# Patient Record
Sex: Female | Born: 1935
Health system: Southern US, Community
[De-identification: ages and names within clinical notes are randomized; demographics above are authoritative.]

## PROBLEM LIST (undated history)

## (undated) DIAGNOSIS — N19 Unspecified kidney failure: Secondary | ICD-10-CM

## (undated) DIAGNOSIS — K219 Gastro-esophageal reflux disease without esophagitis: Secondary | ICD-10-CM

## (undated) DIAGNOSIS — M1990 Unspecified osteoarthritis, unspecified site: Secondary | ICD-10-CM

## (undated) DIAGNOSIS — M25579 Pain in unspecified ankle and joints of unspecified foot: Secondary | ICD-10-CM

## (undated) DIAGNOSIS — H43819 Vitreous degeneration, unspecified eye: Secondary | ICD-10-CM

## (undated) DIAGNOSIS — F32A Depression, unspecified: Secondary | ICD-10-CM

## (undated) DIAGNOSIS — D500 Iron deficiency anemia secondary to blood loss (chronic): Secondary | ICD-10-CM

## (undated) DIAGNOSIS — G8929 Other chronic pain: Secondary | ICD-10-CM

## (undated) DIAGNOSIS — M79646 Pain in unspecified finger(s): Secondary | ICD-10-CM

## (undated) DIAGNOSIS — N184 Chronic kidney disease, stage 4 (severe): Secondary | ICD-10-CM

## (undated) DIAGNOSIS — M79659 Pain in unspecified thigh: Secondary | ICD-10-CM

## (undated) DIAGNOSIS — B9681 Helicobacter pylori [H. pylori] as the cause of diseases classified elsewhere: Secondary | ICD-10-CM

## (undated) DIAGNOSIS — K648 Other hemorrhoids: Secondary | ICD-10-CM

## (undated) DIAGNOSIS — H11159 Pinguecula, unspecified eye: Secondary | ICD-10-CM

## (undated) DIAGNOSIS — F329 Major depressive disorder, single episode, unspecified: Secondary | ICD-10-CM

## (undated) DIAGNOSIS — M109 Gout, unspecified: Secondary | ICD-10-CM

## (undated) DIAGNOSIS — D509 Iron deficiency anemia, unspecified: Secondary | ICD-10-CM

## (undated) DIAGNOSIS — IMO0002 Reserved for concepts with insufficient information to code with codable children: Secondary | ICD-10-CM

## (undated) DIAGNOSIS — M25569 Pain in unspecified knee: Secondary | ICD-10-CM

## (undated) DIAGNOSIS — K2970 Gastritis, unspecified, without bleeding: Secondary | ICD-10-CM

## (undated) DIAGNOSIS — K5520 Angiodysplasia of colon without hemorrhage: Secondary | ICD-10-CM

## (undated) DIAGNOSIS — I639 Cerebral infarction, unspecified: Secondary | ICD-10-CM

## (undated) DIAGNOSIS — Z9289 Personal history of other medical treatment: Secondary | ICD-10-CM

## (undated) DIAGNOSIS — M810 Age-related osteoporosis without current pathological fracture: Secondary | ICD-10-CM

## (undated) DIAGNOSIS — H269 Unspecified cataract: Secondary | ICD-10-CM

## (undated) DIAGNOSIS — I313 Pericardial effusion (noninflammatory): Secondary | ICD-10-CM

## (undated) DIAGNOSIS — M25649 Stiffness of unspecified hand, not elsewhere classified: Secondary | ICD-10-CM

## (undated) DIAGNOSIS — I10 Essential (primary) hypertension: Secondary | ICD-10-CM

## (undated) HISTORY — DX: Age-related osteoporosis without current pathological fracture: M81.0

## (undated) HISTORY — DX: Other hemorrhoids: K64.8

## (undated) HISTORY — DX: Pain in unspecified thigh: M79.659

## (undated) HISTORY — DX: Essential (primary) hypertension: I10

## (undated) HISTORY — DX: Helicobacter pylori (H. pylori) as the cause of diseases classified elsewhere: K29.70

## (undated) HISTORY — DX: Reserved for concepts with insufficient information to code with codable children: IMO0002

## (undated) HISTORY — DX: Angiodysplasia of colon without hemorrhage: K55.20

## (undated) HISTORY — DX: Vitreous degeneration, unspecified eye: H43.819

## (undated) HISTORY — DX: Iron deficiency anemia secondary to blood loss (chronic): D50.0

## (undated) HISTORY — DX: Unspecified cataract: H26.9

## (undated) HISTORY — DX: Helicobacter pylori (H. pylori) as the cause of diseases classified elsewhere: B96.81

## (undated) HISTORY — DX: Iron deficiency anemia, unspecified: D50.9

## (undated) HISTORY — DX: Depression, unspecified: F32.A

## (undated) HISTORY — DX: Stiffness of unspecified hand, not elsewhere classified: M25.649

## (undated) HISTORY — DX: Major depressive disorder, single episode, unspecified: F32.9

## (undated) HISTORY — DX: Unspecified kidney failure: N19

## (undated) HISTORY — DX: Pericardial effusion (noninflammatory): I31.3

## (undated) HISTORY — DX: Pinguecula, unspecified eye: H11.159

---

## 1958-09-02 HISTORY — PX: ABDOMINAL HYSTERECTOMY: SHX81

## 1958-09-02 HISTORY — PX: TUBAL LIGATION: SHX77

## 1998-02-11 ENCOUNTER — Ambulatory Visit (HOSPITAL_COMMUNITY): Admission: RE | Admit: 1998-02-11 | Discharge: 1998-02-11 | Payer: Self-pay | Admitting: Family Medicine

## 1998-02-11 ENCOUNTER — Encounter: Payer: Self-pay | Admitting: Family Medicine

## 1999-01-13 ENCOUNTER — Encounter: Payer: Self-pay | Admitting: Emergency Medicine

## 1999-01-13 ENCOUNTER — Observation Stay (HOSPITAL_COMMUNITY): Admission: EM | Admit: 1999-01-13 | Discharge: 1999-01-14 | Payer: Self-pay | Admitting: Emergency Medicine

## 1999-01-15 ENCOUNTER — Encounter: Payer: Self-pay | Admitting: Family Medicine

## 1999-01-25 ENCOUNTER — Encounter: Admission: RE | Admit: 1999-01-25 | Discharge: 1999-01-25 | Payer: Self-pay | Admitting: Family Medicine

## 1999-02-21 ENCOUNTER — Encounter: Admission: RE | Admit: 1999-02-21 | Discharge: 1999-02-21 | Payer: Self-pay | Admitting: Internal Medicine

## 2001-02-13 ENCOUNTER — Emergency Department (HOSPITAL_COMMUNITY): Admission: EM | Admit: 2001-02-13 | Discharge: 2001-02-13 | Payer: Self-pay | Admitting: Emergency Medicine

## 2001-03-01 ENCOUNTER — Encounter: Payer: Self-pay | Admitting: Emergency Medicine

## 2001-03-01 ENCOUNTER — Emergency Department (HOSPITAL_COMMUNITY): Admission: EM | Admit: 2001-03-01 | Discharge: 2001-03-01 | Payer: Self-pay | Admitting: Emergency Medicine

## 2001-03-24 ENCOUNTER — Encounter: Admission: RE | Admit: 2001-03-24 | Discharge: 2001-03-24 | Payer: Self-pay | Admitting: Internal Medicine

## 2001-05-12 ENCOUNTER — Encounter: Admission: RE | Admit: 2001-05-12 | Discharge: 2001-05-12 | Payer: Self-pay | Admitting: Internal Medicine

## 2002-03-30 ENCOUNTER — Encounter: Admission: RE | Admit: 2002-03-30 | Discharge: 2002-03-30 | Payer: Self-pay | Admitting: Internal Medicine

## 2002-05-19 ENCOUNTER — Emergency Department (HOSPITAL_COMMUNITY): Admission: EM | Admit: 2002-05-19 | Discharge: 2002-05-19 | Payer: Self-pay | Admitting: Emergency Medicine

## 2002-05-19 ENCOUNTER — Encounter: Payer: Self-pay | Admitting: Emergency Medicine

## 2002-08-17 ENCOUNTER — Encounter: Admission: RE | Admit: 2002-08-17 | Discharge: 2002-08-17 | Payer: Self-pay | Admitting: Internal Medicine

## 2003-01-07 ENCOUNTER — Encounter: Admission: RE | Admit: 2003-01-07 | Discharge: 2003-01-07 | Payer: Self-pay | Admitting: Internal Medicine

## 2003-02-15 ENCOUNTER — Encounter: Admission: RE | Admit: 2003-02-15 | Discharge: 2003-02-15 | Payer: Self-pay | Admitting: Internal Medicine

## 2003-02-24 ENCOUNTER — Encounter: Admission: RE | Admit: 2003-02-24 | Discharge: 2003-02-24 | Payer: Self-pay | Admitting: Internal Medicine

## 2003-02-24 ENCOUNTER — Ambulatory Visit (HOSPITAL_COMMUNITY): Admission: RE | Admit: 2003-02-24 | Discharge: 2003-02-24 | Payer: Self-pay | Admitting: Internal Medicine

## 2003-04-19 ENCOUNTER — Encounter: Admission: RE | Admit: 2003-04-19 | Discharge: 2003-04-19 | Payer: Self-pay | Admitting: Internal Medicine

## 2003-04-20 ENCOUNTER — Emergency Department (HOSPITAL_COMMUNITY): Admission: EM | Admit: 2003-04-20 | Discharge: 2003-04-20 | Payer: Self-pay | Admitting: Emergency Medicine

## 2003-05-07 ENCOUNTER — Encounter: Admission: RE | Admit: 2003-05-07 | Discharge: 2003-05-07 | Payer: Self-pay | Admitting: Internal Medicine

## 2003-05-12 ENCOUNTER — Encounter: Admission: RE | Admit: 2003-05-12 | Discharge: 2003-05-12 | Payer: Self-pay | Admitting: Internal Medicine

## 2003-05-13 ENCOUNTER — Encounter: Payer: Self-pay | Admitting: Internal Medicine

## 2003-12-06 ENCOUNTER — Ambulatory Visit: Payer: Self-pay | Admitting: Internal Medicine

## 2003-12-20 ENCOUNTER — Ambulatory Visit: Payer: Self-pay | Admitting: Internal Medicine

## 2004-03-22 ENCOUNTER — Ambulatory Visit: Payer: Self-pay | Admitting: Internal Medicine

## 2004-03-28 ENCOUNTER — Emergency Department (HOSPITAL_COMMUNITY): Admission: EM | Admit: 2004-03-28 | Discharge: 2004-03-28 | Payer: Self-pay | Admitting: Emergency Medicine

## 2004-05-10 ENCOUNTER — Ambulatory Visit (HOSPITAL_COMMUNITY): Admission: RE | Admit: 2004-05-10 | Discharge: 2004-05-10 | Payer: Self-pay | Admitting: Internal Medicine

## 2004-05-10 ENCOUNTER — Encounter: Payer: Self-pay | Admitting: Cardiology

## 2004-05-18 ENCOUNTER — Ambulatory Visit: Payer: Self-pay | Admitting: Cardiology

## 2004-09-06 ENCOUNTER — Ambulatory Visit: Payer: Self-pay | Admitting: Internal Medicine

## 2004-09-13 ENCOUNTER — Ambulatory Visit: Payer: Self-pay | Admitting: Internal Medicine

## 2004-10-30 ENCOUNTER — Inpatient Hospital Stay (HOSPITAL_COMMUNITY): Admission: EM | Admit: 2004-10-30 | Discharge: 2004-11-04 | Payer: Self-pay | Admitting: Emergency Medicine

## 2004-10-31 ENCOUNTER — Encounter: Payer: Self-pay | Admitting: Cardiology

## 2004-11-01 ENCOUNTER — Encounter (INDEPENDENT_AMBULATORY_CARE_PROVIDER_SITE_OTHER): Payer: Self-pay | Admitting: Specialist

## 2004-11-01 DIAGNOSIS — I3139 Other pericardial effusion (noninflammatory): Secondary | ICD-10-CM

## 2004-11-01 DIAGNOSIS — I313 Pericardial effusion (noninflammatory): Secondary | ICD-10-CM

## 2004-11-01 HISTORY — DX: Other pericardial effusion (noninflammatory): I31.39

## 2004-11-01 HISTORY — DX: Pericardial effusion (noninflammatory): I31.3

## 2004-11-01 HISTORY — PX: SUBXYPHOID PERICARDIAL WINDOW: SHX5075

## 2004-11-27 ENCOUNTER — Encounter
Admission: RE | Admit: 2004-11-27 | Discharge: 2004-11-27 | Payer: Self-pay | Admitting: Thoracic Surgery (Cardiothoracic Vascular Surgery)

## 2004-12-13 ENCOUNTER — Ambulatory Visit: Payer: Self-pay | Admitting: Internal Medicine

## 2004-12-13 ENCOUNTER — Inpatient Hospital Stay (HOSPITAL_COMMUNITY): Admission: EM | Admit: 2004-12-13 | Discharge: 2004-12-15 | Payer: Self-pay | Admitting: Emergency Medicine

## 2004-12-13 ENCOUNTER — Encounter: Payer: Self-pay | Admitting: Cardiology

## 2005-01-01 DIAGNOSIS — I639 Cerebral infarction, unspecified: Secondary | ICD-10-CM

## 2005-01-01 HISTORY — PX: CARDIAC CATHETERIZATION: SHX172

## 2005-01-01 HISTORY — DX: Cerebral infarction, unspecified: I63.9

## 2005-01-10 ENCOUNTER — Inpatient Hospital Stay (HOSPITAL_BASED_OUTPATIENT_CLINIC_OR_DEPARTMENT_OTHER): Admission: RE | Admit: 2005-01-10 | Discharge: 2005-01-10 | Payer: Self-pay | Admitting: Cardiology

## 2005-02-21 ENCOUNTER — Ambulatory Visit: Payer: Self-pay | Admitting: Internal Medicine

## 2005-03-06 ENCOUNTER — Ambulatory Visit (HOSPITAL_COMMUNITY): Admission: RE | Admit: 2005-03-06 | Discharge: 2005-03-06 | Payer: Self-pay | Admitting: Internal Medicine

## 2005-03-06 ENCOUNTER — Encounter (INDEPENDENT_AMBULATORY_CARE_PROVIDER_SITE_OTHER): Payer: Self-pay | Admitting: Cardiology

## 2005-03-08 ENCOUNTER — Encounter: Admission: RE | Admit: 2005-03-08 | Discharge: 2005-03-08 | Payer: Self-pay | Admitting: Internal Medicine

## 2005-03-08 ENCOUNTER — Encounter: Payer: Self-pay | Admitting: Internal Medicine

## 2005-03-08 LAB — HM MAMMOGRAPHY: HM Mammogram: NORMAL

## 2005-04-18 ENCOUNTER — Ambulatory Visit: Payer: Self-pay | Admitting: Internal Medicine

## 2005-04-19 ENCOUNTER — Ambulatory Visit: Payer: Self-pay | Admitting: Internal Medicine

## 2005-04-19 ENCOUNTER — Inpatient Hospital Stay (HOSPITAL_COMMUNITY): Admission: AD | Admit: 2005-04-19 | Discharge: 2005-04-21 | Payer: Self-pay | Admitting: Internal Medicine

## 2005-04-19 ENCOUNTER — Encounter: Admission: RE | Admit: 2005-04-19 | Discharge: 2005-04-19 | Payer: Self-pay | Admitting: Internal Medicine

## 2005-04-26 ENCOUNTER — Ambulatory Visit: Payer: Self-pay | Admitting: Internal Medicine

## 2005-05-09 DIAGNOSIS — K648 Other hemorrhoids: Secondary | ICD-10-CM

## 2005-05-09 DIAGNOSIS — K5520 Angiodysplasia of colon without hemorrhage: Secondary | ICD-10-CM | POA: Insufficient documentation

## 2005-05-09 HISTORY — DX: Other hemorrhoids: K64.8

## 2005-05-09 HISTORY — DX: Angiodysplasia of colon without hemorrhage: K55.20

## 2005-05-23 ENCOUNTER — Ambulatory Visit: Payer: Self-pay | Admitting: Internal Medicine

## 2005-05-23 ENCOUNTER — Ambulatory Visit (HOSPITAL_COMMUNITY): Admission: RE | Admit: 2005-05-23 | Discharge: 2005-05-23 | Payer: Self-pay | Admitting: Internal Medicine

## 2005-06-13 ENCOUNTER — Ambulatory Visit: Payer: Self-pay | Admitting: Internal Medicine

## 2005-08-15 ENCOUNTER — Emergency Department (HOSPITAL_COMMUNITY): Admission: EM | Admit: 2005-08-15 | Discharge: 2005-08-15 | Payer: Self-pay | Admitting: Emergency Medicine

## 2005-08-21 ENCOUNTER — Inpatient Hospital Stay (HOSPITAL_COMMUNITY): Admission: EM | Admit: 2005-08-21 | Discharge: 2005-08-24 | Payer: Self-pay | Admitting: Emergency Medicine

## 2005-10-24 DIAGNOSIS — I10 Essential (primary) hypertension: Secondary | ICD-10-CM | POA: Insufficient documentation

## 2005-10-24 DIAGNOSIS — H269 Unspecified cataract: Secondary | ICD-10-CM | POA: Insufficient documentation

## 2005-11-06 ENCOUNTER — Ambulatory Visit (HOSPITAL_COMMUNITY): Admission: RE | Admit: 2005-11-06 | Discharge: 2005-11-06 | Payer: Self-pay | Admitting: Nephrology

## 2005-11-13 ENCOUNTER — Ambulatory Visit (HOSPITAL_COMMUNITY): Admission: RE | Admit: 2005-11-13 | Discharge: 2005-11-13 | Payer: Self-pay | Admitting: Nephrology

## 2005-11-19 ENCOUNTER — Ambulatory Visit (HOSPITAL_COMMUNITY): Admission: RE | Admit: 2005-11-19 | Discharge: 2005-11-19 | Payer: Self-pay | Admitting: Vascular Surgery

## 2005-11-28 ENCOUNTER — Ambulatory Visit (HOSPITAL_COMMUNITY): Admission: RE | Admit: 2005-11-28 | Discharge: 2005-11-28 | Payer: Self-pay | Admitting: Vascular Surgery

## 2005-11-28 HISTORY — PX: ARTERIOVENOUS GRAFT PLACEMENT: SUR1029

## 2005-12-21 ENCOUNTER — Encounter: Admission: RE | Admit: 2005-12-21 | Discharge: 2005-12-21 | Payer: Self-pay | Admitting: Nephrology

## 2006-01-21 ENCOUNTER — Ambulatory Visit (HOSPITAL_COMMUNITY): Admission: RE | Admit: 2006-01-21 | Discharge: 2006-01-21 | Payer: Self-pay | Admitting: Vascular Surgery

## 2006-01-21 HISTORY — PX: THROMBECTOMY / ARTERIOVENOUS GRAFT REVISION: SUR1351

## 2006-02-13 ENCOUNTER — Ambulatory Visit (HOSPITAL_COMMUNITY): Admission: RE | Admit: 2006-02-13 | Discharge: 2006-02-13 | Payer: Self-pay | Admitting: Vascular Surgery

## 2006-02-13 ENCOUNTER — Ambulatory Visit: Payer: Self-pay | Admitting: Vascular Surgery

## 2006-03-06 ENCOUNTER — Ambulatory Visit (HOSPITAL_COMMUNITY): Admission: RE | Admit: 2006-03-06 | Discharge: 2006-03-06 | Payer: Self-pay | Admitting: Vascular Surgery

## 2006-03-06 ENCOUNTER — Ambulatory Visit: Payer: Self-pay | Admitting: Vascular Surgery

## 2006-03-06 HISTORY — PX: THROMBECTOMY: SHX45

## 2006-03-25 ENCOUNTER — Ambulatory Visit (HOSPITAL_COMMUNITY): Admission: RE | Admit: 2006-03-25 | Discharge: 2006-03-25 | Payer: Self-pay | Admitting: Vascular Surgery

## 2006-06-21 ENCOUNTER — Ambulatory Visit (HOSPITAL_COMMUNITY): Admission: RE | Admit: 2006-06-21 | Discharge: 2006-06-21 | Payer: Self-pay | Admitting: Nephrology

## 2006-08-20 ENCOUNTER — Emergency Department (HOSPITAL_COMMUNITY): Admission: EM | Admit: 2006-08-20 | Discharge: 2006-08-20 | Payer: Self-pay | Admitting: Emergency Medicine

## 2007-01-17 ENCOUNTER — Ambulatory Visit: Payer: Self-pay | Admitting: Vascular Surgery

## 2007-01-17 ENCOUNTER — Ambulatory Visit (HOSPITAL_COMMUNITY): Admission: RE | Admit: 2007-01-17 | Discharge: 2007-01-17 | Payer: Self-pay | Admitting: Vascular Surgery

## 2007-02-05 ENCOUNTER — Encounter: Admission: RE | Admit: 2007-02-05 | Discharge: 2007-02-05 | Payer: Self-pay | Admitting: Nephrology

## 2007-10-01 ENCOUNTER — Ambulatory Visit: Payer: Self-pay | Admitting: Internal Medicine

## 2007-10-01 ENCOUNTER — Encounter: Payer: Self-pay | Admitting: Internal Medicine

## 2007-10-01 LAB — HM PAP SMEAR: HM Pap smear: NEGATIVE

## 2008-05-06 ENCOUNTER — Encounter (HOSPITAL_COMMUNITY): Admission: RE | Admit: 2008-05-06 | Discharge: 2008-05-07 | Payer: Self-pay | Admitting: Nephrology

## 2008-05-07 ENCOUNTER — Ambulatory Visit: Payer: Self-pay | Admitting: Surgery

## 2008-10-19 ENCOUNTER — Encounter (HOSPITAL_COMMUNITY): Admission: RE | Admit: 2008-10-19 | Discharge: 2008-12-31 | Payer: Self-pay | Admitting: Nephrology

## 2009-01-13 ENCOUNTER — Encounter: Payer: Self-pay | Admitting: Internal Medicine

## 2009-01-14 ENCOUNTER — Encounter (HOSPITAL_COMMUNITY): Admission: RE | Admit: 2009-01-14 | Discharge: 2009-04-11 | Payer: Self-pay | Admitting: Nephrology

## 2009-01-18 ENCOUNTER — Encounter: Payer: Self-pay | Admitting: Internal Medicine

## 2009-01-18 LAB — HM COLONOSCOPY

## 2009-03-09 ENCOUNTER — Encounter: Payer: Self-pay | Admitting: Internal Medicine

## 2009-04-25 ENCOUNTER — Encounter (HOSPITAL_COMMUNITY): Admission: RE | Admit: 2009-04-25 | Discharge: 2009-07-24 | Payer: Self-pay | Admitting: Nephrology

## 2009-06-01 ENCOUNTER — Ambulatory Visit: Payer: Self-pay | Admitting: Internal Medicine

## 2009-06-01 DIAGNOSIS — D631 Anemia in chronic kidney disease: Secondary | ICD-10-CM | POA: Insufficient documentation

## 2009-06-01 DIAGNOSIS — M25649 Stiffness of unspecified hand, not elsewhere classified: Secondary | ICD-10-CM | POA: Insufficient documentation

## 2009-06-01 DIAGNOSIS — I319 Disease of pericardium, unspecified: Secondary | ICD-10-CM | POA: Insufficient documentation

## 2009-06-01 DIAGNOSIS — D500 Iron deficiency anemia secondary to blood loss (chronic): Secondary | ICD-10-CM | POA: Insufficient documentation

## 2009-06-01 DIAGNOSIS — N184 Chronic kidney disease, stage 4 (severe): Secondary | ICD-10-CM | POA: Insufficient documentation

## 2009-06-01 DIAGNOSIS — D509 Iron deficiency anemia, unspecified: Secondary | ICD-10-CM | POA: Insufficient documentation

## 2009-06-08 ENCOUNTER — Encounter: Payer: Self-pay | Admitting: Internal Medicine

## 2009-07-01 ENCOUNTER — Encounter: Payer: Self-pay | Admitting: Internal Medicine

## 2009-08-30 ENCOUNTER — Ambulatory Visit: Payer: Self-pay | Admitting: Internal Medicine

## 2009-08-30 DIAGNOSIS — F329 Major depressive disorder, single episode, unspecified: Secondary | ICD-10-CM

## 2009-08-30 DIAGNOSIS — F32A Depression, unspecified: Secondary | ICD-10-CM | POA: Insufficient documentation

## 2009-08-30 DIAGNOSIS — R5383 Other fatigue: Secondary | ICD-10-CM

## 2009-08-30 DIAGNOSIS — R5381 Other malaise: Secondary | ICD-10-CM | POA: Insufficient documentation

## 2009-08-30 DIAGNOSIS — G4700 Insomnia, unspecified: Secondary | ICD-10-CM | POA: Insufficient documentation

## 2009-08-30 LAB — CONVERTED CEMR LAB
OCCULT 1: NEGATIVE
OCCULT 2: NEGATIVE

## 2009-08-30 LAB — FECAL OCCULT BLOOD, GUAIAC: Fecal Occult Blood: NEGATIVE

## 2009-08-31 LAB — CONVERTED CEMR LAB
BUN: 56 mg/dL — ABNORMAL HIGH (ref 6–23)
CO2: 28 meq/L (ref 19–32)
Calcium: 9.1 mg/dL (ref 8.4–10.5)
Chloride: 101 meq/L (ref 96–112)
Creatinine, Ser: 2.32 mg/dL — ABNORMAL HIGH (ref 0.40–1.20)
Glucose, Bld: 89 mg/dL (ref 70–99)
HCT: 34.1 % — ABNORMAL LOW (ref 36.0–46.0)
Hemoglobin: 10.4 g/dL — ABNORMAL LOW (ref 12.0–15.0)
MCHC: 30.5 g/dL (ref 30.0–36.0)
MCV: 100.3 fL — ABNORMAL HIGH (ref >=78.0)
Platelets: 362 10*3/uL (ref 150–400)
Potassium: 4.1 meq/L (ref 3.5–5.3)
RBC: 3.4 M/uL — ABNORMAL LOW (ref 3.87–5.11)
RDW: 16.5 % — ABNORMAL HIGH (ref 11.5–15.5)
Sodium: 140 meq/L (ref 135–145)
WBC: 6.7 10*3/uL (ref 4.0–10.5)

## 2009-09-14 ENCOUNTER — Encounter (HOSPITAL_COMMUNITY): Admission: RE | Admit: 2009-09-14 | Discharge: 2009-12-01 | Payer: Self-pay | Admitting: Nephrology

## 2009-11-02 ENCOUNTER — Ambulatory Visit: Payer: Self-pay | Admitting: Internal Medicine

## 2009-11-15 ENCOUNTER — Encounter: Payer: Self-pay | Admitting: Internal Medicine

## 2009-11-15 LAB — CONVERTED CEMR LAB
Albumin: 4.2 g/dL
BUN: 55 mg/dL
CO2: 26 meq/L
Chloride: 101 meq/L
Creatinine, Ser: 2.6 mg/dL
Glucose, Bld: 93 mg/dL
Potassium: 3.9 meq/L
Sodium: 139 meq/L

## 2009-12-14 ENCOUNTER — Encounter: Payer: Self-pay | Admitting: Internal Medicine

## 2009-12-22 ENCOUNTER — Telehealth: Payer: Self-pay | Admitting: Internal Medicine

## 2010-02-01 ENCOUNTER — Ambulatory Visit (INDEPENDENT_AMBULATORY_CARE_PROVIDER_SITE_OTHER): Payer: PRIVATE HEALTH INSURANCE | Admitting: Internal Medicine

## 2010-02-01 ENCOUNTER — Encounter: Payer: Self-pay | Admitting: *Deleted

## 2010-02-01 ENCOUNTER — Encounter: Payer: Self-pay | Admitting: Internal Medicine

## 2010-02-01 ENCOUNTER — Ambulatory Visit: Admit: 2010-02-01 | Payer: Self-pay | Admitting: Internal Medicine

## 2010-02-01 VITALS — BP 125/80 | HR 96 | Temp 97.2°F | Ht 62.0 in | Wt 169.8 lb

## 2010-02-01 DIAGNOSIS — A088 Other specified intestinal infections: Secondary | ICD-10-CM

## 2010-02-01 DIAGNOSIS — I10 Essential (primary) hypertension: Secondary | ICD-10-CM

## 2010-02-01 DIAGNOSIS — N19 Unspecified kidney failure: Secondary | ICD-10-CM

## 2010-02-01 DIAGNOSIS — Z1231 Encounter for screening mammogram for malignant neoplasm of breast: Secondary | ICD-10-CM

## 2010-02-01 DIAGNOSIS — K2970 Gastritis, unspecified, without bleeding: Secondary | ICD-10-CM

## 2010-02-01 DIAGNOSIS — A084 Viral intestinal infection, unspecified: Secondary | ICD-10-CM

## 2010-02-01 DIAGNOSIS — F329 Major depressive disorder, single episode, unspecified: Secondary | ICD-10-CM

## 2010-02-01 DIAGNOSIS — F3289 Other specified depressive episodes: Secondary | ICD-10-CM

## 2010-02-01 DIAGNOSIS — K2990 Gastroduodenitis, unspecified, without bleeding: Secondary | ICD-10-CM

## 2010-02-01 NOTE — Assessment & Plan Note (Signed)
Patient is doing well on current dose of Wellbutrin, with no active symptoms.

## 2010-02-01 NOTE — Patient Instructions (Signed)
Please keep appointment for screening mammogram.

## 2010-02-01 NOTE — Assessment & Plan Note (Signed)
Patient is followed by Dr. Moshe Cipro.  Will request copy of recent notes and labs; if no recent BMET, will have patient return for labs.  Labs reviewed:  Lab Results  Component Value Date   BUN 55 11/15/2009   CREATININE 2.60 11/15/2009

## 2010-02-01 NOTE — Progress Notes (Signed)
  Subjective:    Patient ID: [name], female    DOB: 08-06-1934, 76 y.o.   MRN: [id number]  HPI Patient presents for follow-up of hypertension, chronic renal failure, and with a complaint of recent symptoms of nausea, some diarrhea, and  feeling "wiped out" which started 3 days ago.  Her symptoms have now resolved.  She does report that other family members have had similar symptoms recently.  She reports that she has been drinking plenty of liquids, and is now back to eating normal meals.  She continues to follow up with Dr. Moshe Cipro, her nephrologist, and also at the anemia clinic every 2-3 weeks.  She reports that she had labs drawn there yesterday, and that her renal function is checked monthly.     Review of Systems  Constitutional: Positive for fatigue. Negative for fever and chills.       Felt fatigued 3 days ago, now resolved.  HENT: Negative for sore throat.   Respiratory: Negative for shortness of breath.   Cardiovascular: Negative for chest pain.  Gastrointestinal: Positive for nausea and diarrhea. Negative for vomiting, abdominal pain and blood in stool.       Had nausea and small amount of diarrhea 3 days ago, now resolved.  Genitourinary: Negative for dysuria, urgency, frequency, hematuria, flank pain and pelvic pain.       Objective:   Physical Exam  Cardiovascular: Normal rate, regular rhythm and normal heart sounds.  Exam reveals no gallop and no friction rub.   No murmur heard. Pulmonary/Chest: Breath sounds normal. She has no wheezes. She has no rales. She exhibits no tenderness.  Abdominal: Soft. Bowel sounds are normal. She exhibits no distension and no mass. There is no tenderness. There is no guarding.  Musculoskeletal: She exhibits no edema.          Assessment & Plan:

## 2010-02-01 NOTE — Assessment & Plan Note (Signed)
Patient reports recent symptoms of nausea, diarrhea, fatigue, and malaise, now resolved.  She reports normal PO intake, and her exam is unremarkable.  I advised her to call or return if she has any recurrence of her symptoms.

## 2010-02-01 NOTE — Assessment & Plan Note (Addendum)
Patients blood pressure is well controlled on current regimen.  Plan is to continue amlodipine and furosemide at current dose.  Will request copy of recent notes and labs from Dr. Moshe Cipro.  BP Readings from Last 3 Encounters:  02/01/10 125/80  11/02/09 120/73  08/30/09 128/80    Labs reviewed: Lab Results  Component Value Date   NA 139 11/15/2009   K 3.9 11/15/2009   CREATININE 2.60 11/15/2009

## 2010-02-02 NOTE — Miscellaneous (Signed)
Summary: HIPAA Restrictions  HIPAA Restrictions   Imported By: Bonner Puna 10/01/2007 15:17:48  _____________________________________________________________________  External Attachment:    Type:   Image     Comment:   External Document

## 2010-02-02 NOTE — Assessment & Plan Note (Signed)
Summary: CHECKUP/ SB.   Vital Signs:  Patient Profile:   76 Years Old Female Height:     62 inches Weight:      170.8 pounds BMI:     31.35 Temp:     97.3 degrees F oral Pulse rate:   58 / minute BP sitting:   139 / 86  (left arm)  Pt. in pain?   no  Vitals Entered By: Silverio Decamp NT II (October 01, 2007 10:51 AM)              Is Patient Diabetic? No Nutritional Status BMI of 25 - 29 = overweight  Have you ever been in a relationship where you felt threatened, hurt or afraid?No   Does patient need assistance? Functional Status Self care Ambulation Normal     Chief Complaint:  PAPSMEAR.  History of Present Illness: Patient returns to this clinic for the first time since June 13, 2005.  Following that visit in 2007, she was hospitalized out of state in [place] with acute renal failure and subsequently her care was transferred to Dr. Vanetta Mulders here in [place].  She is currently on hemodialysis, and she reports that she was scheduled here today for a Pap smear only.  She stated that Dr. Moshe Cipro was her physician and that she did not need two physicians, and she specifically stated that she did not want me to manage any of her medical problems since Dr. Moshe Cipro was doing that.  She did not bring her medications to her clinic visit today.  She has no acute complaints.    Current Allergies: ! * FLU VACCINATION ! * ** NEED TO UPDATE ALLERGIES***    Risk Factors:  Tobacco use:  quit    Year quit:  50 YEARS AGO Alcohol use:  no  Mammogram History:    Date of Last Mammogram:  03/08/2005   Review of Systems  GU      Denies abnormal vaginal bleeding, discharge, and genital sores.   Physical Exam  Genitalia:     normal introitus, no external lesions, and no vaginal discharge; no vaginal lesions; a Pap brushing was taken from the vaginal cuff.      Impression & Recommendations:  Problem # 1:  Screening Cervical Cancer (ICD-V76.2) A pelvic  exam with Pap smear was done at the patient's request.  She requested that a copy of the report be sent to her physician Dr. Moshe Cipro.  She specifically stated that she did not want any of her other medical problems to be addressed or followed by me, since these are managed by Dr. Moshe Cipro.  I discussed with her the importance of having her routine health maintenance screening including mammograms, colon cancer screening, and other issues done by Dr. Moshe Cipro.  I advised the patient to call or have Dr. Moshe Cipro call if I can be of any further assistance.  Complete Medication List: 1)  ** Need To Update Medication List***   Other Orders: T-Pap Smear, Thin Prep  [id number])   Patient Instructions: 1)  We will send a copy of your Pap report to Dr. Moshe Cipro. 2)  Please discuss with Dr. Moshe Cipro and call if any further follow-up is needed.   ]

## 2010-02-02 NOTE — Consult Note (Signed)
Summary: [facility]   Imported By: Bonner Puna 01/17/2009 14:32:42  _____________________________________________________________________  External Attachment:    Type:   Image     Comment:   External Document

## 2010-02-02 NOTE — Medication Information (Signed)
Summary: MEDICATION LIST  MEDICATION LIST   Imported By: Garlan Fillers 06/08/2009 11:41:03  _____________________________________________________________________  External Attachment:    Type:   Image     Comment:   External Document

## 2010-02-02 NOTE — Assessment & Plan Note (Signed)
Summary: ?ANEMIC/TIRED/JOINES/DS   Vital Signs:  Patient profile:   76 year old female Height:      62 inches Weight:      171.1 pounds BMI:     31.41 Temp:     97.1 degrees F oral Pulse rate:   87 / minute BP sitting:   128 / 80  (right arm)  Vitals Entered By: Silverio Decamp NT II (August 30, 2009 8:45 AM) CC: TIRED GETS SHOTS FOR ANEMIA Is Patient Diabetic? No Pain Assessment Patient in pain? no      Nutritional Status BMI of > 30 = obese  Have you ever been in a relationship where you felt threatened, hurt or afraid?No   Does patient need assistance? Functional Status Self care Ambulation Normal   CC:  TIRED GETS SHOTS FOR ANEMIA.  History of Present Illness: Patient with h/o chronic presents with fatigue, no energy for the past 2 days .  She went to anemic clinic 7 days ago, got her usual Procrit shot and felt, as usual, with more energy.  She then traveled with her family to the beach, returned because of the hurricaine, and 2 days ago she started feeling tired.  It got worse until yesterday she felt just felt "wiped out."  Patient goes to the anemia clinic every 3 weeks when she receives a Procrit shot.    Patient reports that she has felt down and out for several months, decr appetite and trouble sleeping. Since she has gotten off dialysis last year she has been dwelling more on her son's death from cancer in 04/13/06.  She doesn't enjoy those things she used to enjoy (shopping).  She lives with 2 sons and husband. She was separated from her husband for a time and he lived with another woman.  He is back, but after all that has gone on (him leaving, he didn't seem to care about her when she got sick), she is having trouble reconnecting with him.  She is frustrated and angry with him at times, but instead of telling him she just "keeps it inside".  Denies SI/HI.  Every 3-4 days she has a headache that lasts 30-45 minutes.  She gets still and lets it pass.  If it stays longer, she  takes tylenol and it goes away with that.  Right-sided hit then eases off.  No light sensitivity, no noise sensitivity.  She does have nausea. No rinorrhea or watery eyes.  Patient has trouble sleeping; she takes a benadryl to help her sleep.  This is a chronic problem for her.  No SOB, CP, dizziness, weight loss, fevers.  Patient does feel nauseous but always does in the morning.  No gross blood in her stool.  Pt did complete 2 stool cards and brought those in with her today.    Patient has some residual weakness on her left side with some numbness of her left fingertips, but this has been stable.  She did not take any medicines this morning.    Preventive Screening-Counseling & Management  Alcohol-Tobacco     Smoking Status: quit     Year Quit: 74 YEARS AGO  Current Problems (verified): 1)  Fatigue  (ICD-780.79) 2)  Depression  (ICD-311) 3)  Hx of Anemia-iron Deficiency  (ICD-280.9) 4)  Hx of Renal Failure  (ICD-586) 5)  Hypertension  (ICD-401.9) 6)  Hx of Pericardial Effusion  (ICD-423.9) 7)  Gastritis  (ICD-535.50) 8)  Angiodysplasia, Colon  (ICD-569.84) 9)  Hx of Anemia Due To  Chronic Blood Loss  (ICD-280.0) 10)  Hx of Joint Stiffness, Hand  (ICD-719.54) 11)  Hemorrhoids, Internal  (ICD-455.0) 12)  Pinguecula  (ICD-372.51) 13)  Cataracts  (ICD-366.9) 14)  Hx of Vitreous Detachment  (ICD-379.21) 15)  Hysterectomy, Hx of  (ICD-V45.77) 16)  Coronary Artery Disease, Family Hx  (ICD-V17.3)  Current Medications (verified): 1)  Omeprazole 20 Mg Cpdr (Omeprazole) .... Take 1 Capsule By Mouth Two Times A Day 2)  Calcium Acetate 667 Mg Caps (Calcium Acetate (Phos Binder)) .... Take 1 Capsule By Mouth Two Times A Day 3)  Rena-Vite  Tabs (B Complex-C-Folic Acid) .... Take 1 Tablet By Mouth Once A Day 4)  Amlodipine Besylate 10 Mg Tabs (Amlodipine Besylate) .... Take 1 Tablet By Mouth Daily At Bedtime 5)  Furosemide 80 Mg Tabs (Furosemide) .... Take 1/2 Tablet By Mouth Once A Day 6)   Sensipar 30 Mg Tabs (Cinacalcet Hcl) .... Take One Tablet By Mouth Every Other Day 7)  Cvs Iron 325 (65 Fe) Mg Tabs (Ferrous Sulfate) .... Take 1 Tablet By Mouth Daily 8)  Wellbutrin Xl 150 Mg Xr24h-Tab (Bupropion Hcl) .... Take 1 Tablet By Mouth Every Morning  Allergies (verified): 1)  ! Flu Vaccine  Past History:  Social History: Last updated: 08/30/2009 Lives with husband (recently moved back in after period of separation) and 2 sons.  Past medical history reviewed for relevance to current acute and chronic problems. Social history (including risk factors) reviewed for relevance to current acute and chronic problems.  Past Medical History: Reviewed history from 10/24/2005 and no changes required. ANEMIA-IRON DEFICIENCY (ICD-280.9) RENAL FAILURE (ICD-586) HYPERTENSION (ICD-401.9) PERICARDIAL EFFUSION (ICD-423.9) GASTRITIS (ICD-535.50) ANGIODYSPLASIA, COLON (ICD-569.84) ANEMIA DUE TO CHRONIC BLOOD LOSS (ICD-280.0) JOINT STIFFNESS, HAND (ICD-719.54) FATIGUE (ICD-780.79) HEMORRHOIDS, INTERNAL (ICD-455.0) PINGUECULA (ICD-372.51) CATARACTS (ICD-366.9) VITREOUS DETACHMENT (ICD-379.21) HYSTERECTOMY, HX OF (ICD-V45.77) THIGH PAIN (ICD-729.5) EDEMA, HANDS (ICD-729.81) EPICONDYLITIS (ICD-726.32) CORONARY ARTERY DISEASE, FAMILY HX (ICD-V17.3)  Social History: Reviewed history and no changes required. Lives with husband (recently moved back in after period of separation) and 2 sons.  Review of Systems       see HPI  Physical Exam  General:  NAD Mouth:  pharynx pink and moist.   Neck:  supple, full ROM, and no masses.   Lungs:  normal respiratory effort, normal breath sounds, no crackles, and no wheezes.   Heart:  normal rate, regular rhythm, no murmur Abdomen:  soft, non-tender, and normal bowel sounds.   Extremities:  trace, non-pitting edema bilateral lower extremities Neurologic:  alert & oriented X3.  CN grossly intact.  gait wnl Psych:  Oriented X3, memory intact for  recent and remote, normally interactive, good eye contact, and not anxious appearing.     Impression & Recommendations:  Problem # 1:  DEPRESSION (ICD-311) Assessment New Patient with symptoms of depression now for several months, which may certainly contribute to problem #2.  Denies any SI/HI.  Given patient's renal failure, will need to be careful with medicines prescribed.  GFR estimated is 20.  Will try wellbutrin XL 150mg  and will NOT titrate up dose.  Could try next SSRI such as celexa, however, not recommended for GFR<20, so this patient is borderline for this drug.  Will see her back in 4-6 weeks for assessment of how she is doing.   Her updated medication list for this problem includes:    Wellbutrin Xl 150 Mg Xr24h-tab (Bupropion hcl) .[name]... Take 1 tablet by mouth every morning  Problem # 2:  FATIGUE (ICD-780.79) Assessment: New  Patient with h/o chronic fatigue 2/2 anemia, however with recent increase in fatigue over the past 2 days.  Acute blood loss unlikely, however given pt's h/o anemia (2/2 renal failure and iron deficiency) will check CBC today. Guiac cards x2 negative.  Per renal note from July 1, baseline Hgb high 10s.  Given her renal disease, her goal Hgb is no higher than 10-11.  Her fatigue could also be from depression (see problem #1) or from benadryl use (see problem #5).  Given h/o renal failure, uremia could contribute to her fatigue, so will check B-Met today.  Thyroid is in differential, but patient denies other symptoms of hypothyroidism such as feeling cold or having dry skin; will hold off for now on TSH for now.  Problem # 3:  Hx of RENAL FAILURE (ICD-586) Assessment: Comment Only Patient is followed by renal for this - she is stage 4 CKD that was previously dialysis dependent.  Labs from 08/01/09 showed BUN 51, Cr 2.62, eGFR of 20.  Next follow-up with renal is due in January 2012.  She is to continue management at the anemia clinic with q3week procrit  injections. Orders: T-Basic Metabolic Panel ([id number])  Problem # 4:  Hx of ANEMIA-IRON DEFICIENCY (ICD-280.9) Assessment: Comment Only See problem #2.Last iron panel from Aug 01, 2009 showed low Tibc, low serum iron, low-normal iron saturation, and high ferritin. Patient is followed at the anemia clinic; she is to continue this.  Continue their recommendation of daily PO iron supplementation.        Her updated medication list for this problem includes:    Cvs Iron 325 (65 Fe) Mg Tabs (Ferrous sulfate) .[name]... Take 1 tablet by mouth daily  Orders: T-CBC No Diff [id number]) Hemoccult Cards (Take Home) (Hemoccult Cards)  Problem # 5:  INSOMNIA (ICD-780.52) Assessment: New  Pt has decent sleep hygiene habits, however she does use benadryl nearly every night to help her sleep.   Pt was counseled on sleep hygiene and caffiene use and asked to avoid benadryl until her follow-up appointment with [place] in 4-6 weeks so that we can get a more accurate idea of her sleep dysfunction.    Complete Medication List: 1)  Omeprazole 20 Mg Cpdr (Omeprazole) .... Take 1 capsule by mouth two times a day 2)  Calcium Acetate 667 Mg Caps (Calcium acetate (phos binder)) .... Take 1 capsule by mouth two times a day 3)  Rena-vite Tabs (B complex-c-folic acid) .... Take 1 tablet by mouth once a day 4)  Amlodipine Besylate 10 Mg Tabs (Amlodipine besylate) .... Take 1 tablet by mouth daily at bedtime 5)  Furosemide 80 Mg Tabs (Furosemide) .... Take 1/2 tablet by mouth once a day 6)  Sensipar 30 Mg Tabs (Cinacalcet hcl) .... Take one tablet by mouth every other day 7)  Cvs Iron 325 (65 Fe) Mg Tabs (Ferrous sulfate) .... Take 1 tablet by mouth daily 8)  Wellbutrin Xl 150 Mg Xr24h-tab (Bupropion hcl) .... Take 1 tablet by mouth every morning  Patient Instructions: 1)  You have been given a prescription for a new medicine wellbutrin - take 1 tablet by mouth every morning. 2)  You had lab work today, I will phone you if  there are any abnormal results. 3)  Please try to stop using benadryl to help you sleep. 4)  Please follow-up with [place] (preferrably me or Dr. Marinda Elk) in 4-6 weeks. Prescriptions: WELLBUTRIN XL 150 MG XR24H-TAB (BUPROPION HCL) Take 1 tablet by mouth every morning  #30  x 2   Entered and Authorized by:   Rikki Spearing, MD   Signed by:   Rikki Spearing, MD on 08/30/2009   Method used:   Electronically to        [facility] [phone]* (retail)       [address]       [place], [place]  [address]       Ph: [phone]       Fax: [phone]   RxID:   [id number]   Prevention & Chronic Care Immunizations   Influenza vaccine: Not documented   Influenza vaccine deferral: Contraindicated  (06/01/2009)    Tetanus booster: Not documented   Td booster deferral: Refused  (06/01/2009)    Pneumococcal vaccine: Not documented   Pneumococcal vaccine deferral: Refused  (06/01/2009)    H. zoster vaccine: Not documented  Colorectal Screening   Hemoccult: Not documented    Colonoscopy: Not documented  Other Screening   Pap smear: NEGATIVE FOR INTRAEPITHELIAL LESIONS OR MALIGNANCY.  (10/01/2007)    Mammogram: Normal  (03/08/2005)    DXA bone density scan: Not documented   Smoking status: quit  (08/30/2009)  Lipids   Total Cholesterol: Not documented   LDL: Not documented   LDL Direct: Not documented   HDL: Not documented   Triglycerides: Not documented  Hypertension   Last Blood Pressure: 128 / 80  (08/30/2009)   Serum creatinine: Not documented   Serum potassium Not documented  Self-Management Support :    Patient will work on the following items until the next clinic visit to reach self-care goals:     Medications and monitoring: take my medicines every day, check my blood pressure, bring all of my medications to every visit  (08/30/2009)     Eating: drink diet soda or water instead of juice or soda, eat more vegetables, use fresh or frozen vegetables, eat foods that  are low in salt, eat baked foods instead of fried foods, eat fruit for snacks and desserts  (08/30/2009)    Hypertension self-management support: Education handout, Resources for patients handout  (08/30/2009)   Hypertension education handout printed      Resource handout printed.   Laboratory Results  Date/Time Received: August 30, 2009 9:05 AM Date/Time Reported: Maryan Rued  August 30, 2009 9:05 AM   Stool - Occult Blood Hemmoccult #1: negative Date: 08/29/2009 Hemoccult #2: negative Date: 08/30/2009 Comments: Only 2 specimens collected [facility]  August 30, 2009 9:06 AM   Kit Test Internal QC: Positive   (Normal Range: Negative)  Process Orders Check Orders Results:     [facility]: Check successful Tests Sent for requisitioning (August 30, 2009 2:08 PM):     08/30/2009: [facility] -- T-Basic Metabolic Panel [id number] (signed)     08/30/2009: [facility] -- T-CBC No Diff [id number] (signed)     Appended Document: ?ANEMIC/TIRED/JOINES/DS Ms. Battle' history and physcial examination reviewed with Dr. Shon Baton and the assessment and plan were formulated together.  The cause of her fatigue is likely multifactorial and includes chronic benedryl use, poor sleep (hence the reason for the benedryl use), and depression.  We are asking her to stop the benedryl, work on sleep hygeine, and try to pharmacologically manage her depression, which undoubtedly is contributing to her underlying insomnia.  I agree with the above documentation.

## 2010-02-02 NOTE — Consult Note (Signed)
Summary: [facility]   Imported By: Lacy Duverney 09/07/2009 16:50:27  _____________________________________________________________________  External Attachment:    Type:   Image     Comment:   External Document

## 2010-02-02 NOTE — Assessment & Plan Note (Signed)
Summary: FU VISIT/DS   Vital Signs:  Patient profile:   76 year old female Height:      62 inches Weight:      169.9 pounds BMI:     31.19 Temp:     98.3 degrees F oral Pulse rate:   58 / minute BP sitting:   120 / 75  (right arm)  Vitals Entered By: Silverio Decamp NT II (June 01, 2009 10:02 AM) CC: checkup/ with physical Is Patient Diabetic? No Pain Assessment Patient in pain? no      Nutritional Status BMI of > 30 = obese  Have you ever been in a relationship where you felt threatened, hurt or afraid?No   Does patient need assistance? Functional Status Self care Ambulation Normal   CC:  checkup/ with physical.  History of Present Illness: [name] returns in order to re-establish primary care with me; her last visit her was in September of 2009.  She has no acute complaints today.  She reports that her kidney function improved following her last visit here and that she has been off of dialysis for about 14 months.  She still follows up with her nephrologist Dr. Clover Mealy, and reports monthly visits for a shot.  She brought a list of her current medications, and she reports that she has been compliant with her medications.  She had a recent GI workup at [facility].      Preventive Screening-Counseling & Management  Alcohol-Tobacco     Smoking Status: quit     Year Quit: 50 YEARS AGO  Current Medications (verified): 1)  Omeprazole 20 Mg Cpdr (Omeprazole) .... Take 1 Capsule By Mouth Two Times A Day 2)  Calcium Acetate 667 Mg Caps (Calcium Acetate (Phos Binder)) .... Take 1 Capsule By Mouth Two Times A Day 3)  Rena-Vite  Tabs (B Complex-C-Folic Acid) .... Take 1 Tablet By Mouth Once A Day 4)  Amlodipine Besylate 10 Mg Tabs (Amlodipine Besylate) .... Take 1 Tablet By Mouth Daily At Bedtime 5)  Furosemide 80 Mg Tabs (Furosemide) .... Take 1/2 Tablet By Mouth Once A Day 6)  Sensipar 30 Mg Tabs (Cinacalcet Hcl) .... Take One Tablet By Mouth Every Other Day  Allergies: 1)  ! Flu  Vaccine  Review of Systems General:  Denies chills, fever, loss of appetite, sweats, and weight loss; Gaining weight back.. Eyes:  Denies double vision. ENT:  Denies decreased hearing. CV:  Denies chest pain or discomfort, difficulty breathing at night, difficulty breathing while lying down, shortness of breath with exertion, swelling of feet, and swelling of hands. Resp:  Denies cough, shortness of breath, and wheezing. GI:  Denies abdominal pain, bloody stools, dark tarry stools, diarrhea, nausea, and vomiting. GU:  Denies dysuria and urinary frequency. MS:  Denies muscle aches and cramps. Neuro:  reports occasiona numbness in tips of fingers. Psych:  Denies anxiety and depression.  Physical Exam  General:  alert, no distress Lungs:  normal respiratory effort, normal breath sounds, no crackles, and no wheezes.   Heart:  normal rate, regular rhythm, no murmur, no gallop, and no rub.   Abdomen:  soft, non-tender, normal bowel sounds, no hepatomegaly, and no splenomegaly.   Extremities:  no edema   Impression & Recommendations:  Problem # 1:  HYPERTENSION (ICD-401.9) Patient's blood pressure is well controlled on current regimen.  Plan is to continue current antihypertensive medications.  Will request records from Dr. Clover Mealy and see if any labs are needed; patient says she had  lab work done about 2 weeks ago.   Her updated medication list for this problem includes:    Amlodipine Besylate 10 Mg Tabs (Amlodipine besylate) .[name]... Take 1 tablet by mouth daily at bedtime    Furosemide 80 Mg Tabs (Furosemide) .[name]... Take 1/2 tablet by mouth once a day  BP today: 120/75 Prior BP: 139/86 (10/01/2007)  Problem # 2:  RENAL FAILURE (ICD-586) As noted in HPI, patient is now off of dialysis.  The patient developed renal failure in July/August of 2007 while hospitalized near [place], and the renal failure was apparently attributed to hypertension.  However, her blood pressure was normal on most  of her office visits in 2006-2007and she was not on antihypertensive medication.  Her creatinine was normal in May of 2007, 2 months prior to her developing acute renal failure.  A question of possible collagen vascular disease had been raised in 2006 due to arthralgias, a history of idiopathic pericarditis, and increased pigmentation of her palms, and she was referred to [name] but he was unable to confirm a rheumatologic diagnosis.  The cause of her acute renal failure in July/August of 2007, with subsequent improvement, is not clear.  Will request records from her nephrologist Dr. Clover Mealy.   Complete Medication List: 1)  Omeprazole 20 Mg Cpdr (Omeprazole) .... Take 1 capsule by mouth two times a day 2)  Calcium Acetate 667 Mg Caps (Calcium acetate (phos binder)) .... Take 1 capsule by mouth two times a day 3)  Rena-vite Tabs (B complex-c-folic acid) .... Take 1 tablet by mouth once a day 4)  Amlodipine Besylate 10 Mg Tabs (Amlodipine besylate) .... Take 1 tablet by mouth daily at bedtime 5)  Furosemide 80 Mg Tabs (Furosemide) .... Take 1/2 tablet by mouth once a day 6)  Sensipar 30 Mg Tabs (Cinacalcet hcl) .... Take one tablet by mouth every other day  Patient Instructions: 1)  Please schedule a follow-up appointment in 3 months.   Prevention & Chronic Care Immunizations   Influenza vaccine: Not documented   Influenza vaccine deferral: Contraindicated  (06/01/2009)    Tetanus booster: Not documented   Td booster deferral: Refused  (06/01/2009)    Pneumococcal vaccine: Not documented   Pneumococcal vaccine deferral: Refused  (06/01/2009)    H. zoster vaccine: Not documented    Immunization comments: Will request records from Dr. Clover Mealy  Colorectal Screening   Hemoccult: Not documented    Colonoscopy: Not documented  Other Screening   Pap smear: NEGATIVE FOR INTRAEPITHELIAL LESIONS OR MALIGNANCY.  (10/01/2007)    Mammogram: Normal  (03/08/2005)    DXA bone density  scan: Not documented  Reports requested:   Last mammogram report requested.  Smoking status: quit  (06/01/2009)    Screening comments: Will request records from Dr. Clover Mealy  Lipids   Total Cholesterol: Not documented   LDL: Not documented   LDL Direct: Not documented   HDL: Not documented   Triglycerides: Not documented  Hypertension   Last Blood Pressure: 120 / 75  (06/01/2009)   Serum creatinine: Not documented   Serum potassium Not documented    Hypertension flowsheet reviewed?: Yes   Progress toward BP goal: At goal   Hypertension comments: Will request records from Dr. Clover Mealy  Self-Management Support :    Patient will work on the following items until the next clinic visit to reach self-care goals:     Medications and monitoring: take my medicines every day, check my blood pressure, bring all of my medications to  every visit  (06/01/2009)     Eating: drink diet soda or water instead of juice or soda, eat more vegetables, eat foods that are low in salt, eat baked foods instead of fried foods  (06/01/2009)    Hypertension self-management support: Education handout, Written self-care plan  (06/01/2009)   Hypertension self-care plan printed.   Hypertension education handout printed   Nursing Instructions: Request report of last mammogram

## 2010-02-02 NOTE — Assessment & Plan Note (Signed)
Summary: [name]) FU [name].   Vital Signs:  Patient profile:   76 year old female Height:      62 inches Weight:      174.5 pounds BMI:     32.03 Temp:     97.9 degrees F oral Pulse rate:   98 / minute BP sitting:   120 / 73  (right arm)  Vitals Entered By: [name] NT II (November 02, 2009 8:56 AM) CC: checkup, Depression Is Patient Diabetic? No Pain Assessment Patient in pain? no      Nutritional Status BMI of > 30 = obese  Have you ever been in a relationship where you felt threatened, hurt or afraid?No   Does patient need assistance? Functional Status Self care Ambulation Normal   CC:  checkup and Depression.  History of Present Illness: Patient presents for follow-up of her tiredness/fatigability, hypertension, and other chronic medical problems.  She has no complaints, and reports that her tiredness resolved following her last visit here.  She reports that she is compliant with her medications.  She is scheduled to see her nephrologist Dr. Moshe Cipro in January of 2012.  She reports that she gets Procrit shots twice a month at the anemia clinic.  Depression History:      The patient denies a depressed mood most of the day and a diminished interest in her usual daily activities.         Preventive Screening-Counseling & Management  Alcohol-Tobacco     Smoking Status: quit     Year Quit: 50 YEARS AGO  Current Medications (verified): 1)  Omeprazole 20 Mg Cpdr (Omeprazole) .... Take 1 Capsule By Mouth Two Times A Day 2)  Calcium Acetate 667 Mg Caps (Calcium Acetate (Phos Binder)) .... Take 1 Capsule By Mouth Two Times A Day 3)  Rena-Vite  Tabs (B Complex-C-Folic Acid) .... Take 1 Tablet By Mouth Once A Day 4)  Amlodipine Besylate 10 Mg Tabs (Amlodipine Besylate) .... Take 1 Tablet By Mouth Daily At Bedtime 5)  Furosemide 80 Mg Tabs (Furosemide) .... Take 1/2 Tablet By Mouth Two Times A Day 6)  Sensipar 30 Mg Tabs (Cinacalcet Hcl) .... Take One Tablet By Mouth  Every Other Day 7)  Cvs Iron 325 (65 Fe) Mg Tabs (Ferrous Sulfate) .... Take 1 Tablet By Mouth Daily 8)  Wellbutrin Xl 150 Mg Xr24h-Tab (Bupropion Hcl) .... Take 1 Tablet By Mouth Every Morning  Allergies (verified): 1)  ! Flu Vaccine  Family History: No breast, colon, cervical, or lung cancer. No FH cancer. Father died of MI at age 92. Brother died of MI at age 59. Brother had hypertension. Brother has Type II diabetes. No FH sickle cell disease.  Social History: Lives with husband (recently moved back in after period of separation) and 2 sons. Retired; formerly domestic work. No alcohol use.  Review of Systems General:  Denies chills, fatigue, fever, loss of appetite, and sweats. Eyes:  Denies blurring and double vision. ENT:  Denies difficulty swallowing, earache, and sore throat. CV:  Denies chest pain or discomfort, difficulty breathing at night, difficulty breathing while lying down, fatigue, palpitations, and swelling of feet. Resp:  Denies chest discomfort, cough, shortness of breath, and wheezing. GI:  Denies abdominal pain, bloody stools, dark tarry stools, diarrhea, nausea, and vomiting. GU:  Denies dysuria and urinary frequency. MS:  Denies muscle aches and cramps. Derm:  Denies rash. Psych:  Denies anxiety and depression.  Physical Exam  General:  alert, no distress Lungs:  normal respiratory effort, normal breath sounds, no crackles, and no wheezes.   Heart:  normal rate, regular rhythm, no murmur, no gallop, and no rub.   Abdomen:  soft, non-tender, normal bowel sounds, no hepatomegaly, and no splenomegaly.   Extremities:  no edema   Impression & Recommendations:  Problem # 1:  HYPERTENSION (ICD-401.9) Patient's blood pressure is well controlled on current regimen.  Plan is to continue current antihypertensive medications.  Her updated medication list for this problem includes:    Amlodipine Besylate 10 Mg Tabs (Amlodipine besylate) .[name]... Take 1 tablet  by mouth daily at bedtime    Furosemide 80 Mg Tabs (Furosemide) .[name]... Take 1/2 tablet by mouth two times a day  BP today: 120/73 Prior BP: 128/80 (08/30/2009)  Labs Reviewed: K+: 4.1 (08/30/2009) Creat: : 2.32 (08/30/2009)     Problem # 2:  Hx of RENAL FAILURE (ICD-586) Patient was formerly on dialysis, but her renal function improved; she now has chronic renal insufficiency but does not require dialysis.  She reports having labs done regularly at the anemia clinic, and we will request copies of her recent blood work.  Labs Reviewed: BUN: 56 (08/30/2009)   Cr: 2.32 (08/30/2009)    Hgb: 10.4 (08/30/2009)   Hct: 34.1 (08/30/2009)   Ca++: 9.1 (08/30/2009)     Problem # 3:  Hx of ANEMIA-IRON DEFICIENCY (ICD-280.9) Will request copies of labs from nephrology/anemia clinic.  Her updated medication list for this problem includes:    Cvs Iron 325 (65 Fe) Mg Tabs (Ferrous sulfate) .[name]... Take 1 tablet by mouth daily  Hgb: 10.4 (08/30/2009)   Hct: 34.1 (08/30/2009)   Platelets: 362 (08/30/2009) RBC: 3.40 (08/30/2009)   RDW: 16.5 (08/30/2009)   WBC: 6.7 (08/30/2009) MCV: 100.3 (08/30/2009)   MCHC: 30.5 (08/30/2009)  Problem # 4:  GASTRITIS (ICD-535.50) Patient is doing well on omeprazole.  Her updated medication list for this problem includes:    Omeprazole 20 Mg Cpdr (Omeprazole) .[name]... Take 1 capsule by mouth two times a day  Complete Medication List: 1)  Omeprazole 20 Mg Cpdr (Omeprazole) .... Take 1 capsule by mouth two times a day 2)  Calcium Acetate 667 Mg Caps (Calcium acetate (phos binder)) .... Take 1 capsule by mouth two times a day 3)  Rena-vite Tabs (B complex-c-folic acid) .... Take 1 tablet by mouth once a day 4)  Amlodipine Besylate 10 Mg Tabs (Amlodipine besylate) .... Take 1 tablet by mouth daily at bedtime 5)  Furosemide 80 Mg Tabs (Furosemide) .... Take 1/2 tablet by mouth two times a day 6)  Sensipar 30 Mg Tabs (Cinacalcet hcl) .... Take one tablet by mouth every other  day 7)  Cvs Iron 325 (65 Fe) Mg Tabs (Ferrous sulfate) .... Take 1 tablet by mouth daily 8)  Wellbutrin Xl 150 Mg Xr24h-tab (Bupropion hcl) .... Take 1 tablet by mouth every morning  Other Orders: Mammogram (Screening) (Mammo)  Patient Instructions: 1)  Please schedule a follow-up appointment in 3 months. 2)  Please keep appointment for screening mammogram. 3)  Please have the kidney center and anemia clinic forward records of labs and immunizations to Dr. [name] at the Internal Medicine outpatient clinic at [facility].    Orders Added: 1)  Mammogram (Screening) [Mammo] 2)  Est. Patient Level III [id number]      Prevention & Chronic Care Immunizations   Influenza vaccine: Not documented   Influenza vaccine deferral: Contraindicated  (06/01/2009)    Tetanus booster: Not documented   Td  booster deferral: Refused  (06/01/2009)    Pneumococcal vaccine: Not documented   Pneumococcal vaccine deferral: Refused  (06/01/2009)    H. zoster vaccine: Not documented    Immunization comments: Reports severe reaction to flu shot in mid-1990s  Colorectal Screening   Hemoccult: Not documented   Hemoccult action/deferral: Deferred  (11/02/2009)    Colonoscopy: Not documented  Other Screening   Pap smear: NEGATIVE FOR INTRAEPITHELIAL LESIONS OR MALIGNANCY.  (10/01/2007)   Pap smear action/deferral: Deferred-3 yr interval  (11/02/2009)    Mammogram: Normal  (03/08/2005)   Mammogram action/deferral: Ordered  (11/02/2009)    DXA bone density scan: Not documented   Smoking status: quit  (11/02/2009)  Lipids   Total Cholesterol: Not documented   LDL: Not documented   LDL Direct: Not documented   HDL: Not documented   Triglycerides: Not documented  Hypertension   Last Blood Pressure: 120 / 73  (11/02/2009)   Serum creatinine: 2.32  (08/30/2009)   Serum potassium 4.1  (08/30/2009)    Hypertension flowsheet reviewed?: Yes   Progress toward BP goal: At  goal  Self-Management Support :    Patient will work on the following items until the next clinic visit to reach self-care goals:     Medications and monitoring: take my medicines every day, check my blood pressure  (11/02/2009)     Eating: drink diet soda or water instead of juice or soda, eat more vegetables, use fresh or frozen vegetables, eat foods that are low in salt, eat baked foods instead of fried foods  (11/02/2009)    Hypertension self-management support: Education handout, Written self-care plan  (11/02/2009)   Hypertension self-care plan printed.   Hypertension education handout printed   Nursing Instructions: Schedule screening mammogram (see order)

## 2010-02-02 NOTE — Consult Note (Signed)
[facility]   Imported By: Enedina Finner 07/25/2009 14:39:54  _____________________________________________________________________  External Attachment:    Type:   Image     Comment:   External Document

## 2010-02-02 NOTE — Progress Notes (Signed)
Summary: med refill/gp  Phone Note Refill Request Message from:  Fax from Pharmacy on December 22, 2009 11:21 AM  Refills Requested: Medication #1:  WELLBUTRIN XL 150 MG XR24H-TAB Take 1 tablet by mouth every morning.   Last Refilled: 11/18/2009 Last appt. Nov 2.   Method Requested: Electronic Initial call taken by: Morrison Old RN,  December 22, 2009 11:22 AM  Follow-up for Phone Call        Refilled electronically.  I requested that a follow-up appointment be scheduled with me. Follow-up by: [name] MD,  December 22, 2009 11:48 AM    Prescriptions: WELLBUTRIN XL 150 MG XR24H-TAB (BUPROPION HCL) Take 1 tablet by mouth every morning  #30 x 0   Entered and Authorized by:   [name] MD   Signed by:   [name] MD on 12/22/2009   Method used:   Electronically to        [facility] [phone]* (retail)       [address]       [place], [place]  [address]       Ph: [phone]       Fax: [phone]   RxID:   [id number]

## 2010-02-09 ENCOUNTER — Encounter (HOSPITAL_COMMUNITY): Payer: PRIVATE HEALTH INSURANCE | Attending: Nephrology

## 2010-02-09 DIAGNOSIS — D509 Iron deficiency anemia, unspecified: Secondary | ICD-10-CM | POA: Insufficient documentation

## 2010-02-16 ENCOUNTER — Encounter (HOSPITAL_COMMUNITY): Payer: PRIVATE HEALTH INSURANCE

## 2010-03-27 ENCOUNTER — Other Ambulatory Visit: Payer: Self-pay | Admitting: Internal Medicine

## 2010-03-28 NOTE — Telephone Encounter (Signed)
Refill approved.

## 2010-04-05 ENCOUNTER — Ambulatory Visit (INDEPENDENT_AMBULATORY_CARE_PROVIDER_SITE_OTHER): Payer: PRIVATE HEALTH INSURANCE | Admitting: Internal Medicine

## 2010-04-05 ENCOUNTER — Encounter: Payer: Self-pay | Admitting: Internal Medicine

## 2010-04-05 VITALS — BP 134/85 | HR 89 | Temp 97.8°F | Wt 168.6 lb

## 2010-04-05 DIAGNOSIS — M79609 Pain in unspecified limb: Secondary | ICD-10-CM

## 2010-04-05 DIAGNOSIS — I10 Essential (primary) hypertension: Secondary | ICD-10-CM

## 2010-04-05 DIAGNOSIS — Z13220 Encounter for screening for lipoid disorders: Secondary | ICD-10-CM

## 2010-04-05 DIAGNOSIS — D509 Iron deficiency anemia, unspecified: Secondary | ICD-10-CM

## 2010-04-05 DIAGNOSIS — N19 Unspecified kidney failure: Secondary | ICD-10-CM

## 2010-04-05 DIAGNOSIS — M79646 Pain in unspecified finger(s): Secondary | ICD-10-CM

## 2010-04-05 DIAGNOSIS — F329 Major depressive disorder, single episode, unspecified: Secondary | ICD-10-CM

## 2010-04-05 NOTE — Progress Notes (Signed)
  Subjective:    Patient ID: [name], female    DOB: February 18, 1934, 76 y.o.   MRN: [id number]  HPI Patient returns for follow-up of her hypertension, depression, chronic renal insufficiency, anemia, and other chronic medical problems.  She also has a complaint of pain on the dorsal aspect of the base of her left arm which is intermittent and usually resolves on its on after a few hours; she has had occasional episodes of this pain over the past couple of months but it is not a continual problem.  She denies any injury to her thumb.  She also denies any other joint pain.  Overall she reports that she is doing well.  She is not having any symptoms of depression, and is very happy with her response to the bupropion.  She reports that she continues to follow-up at the anemia clinic and with her nephrologist Dr. Moshe Cipro.  She reports that she is compliant with her medications.   Review of Systems  Constitutional: Negative for fever, chills, diaphoresis, appetite change and unexpected weight change.  Respiratory: Negative for cough, shortness of breath and wheezing.   Cardiovascular: Negative for chest pain, palpitations and leg swelling.  Gastrointestinal: Negative for nausea, vomiting, abdominal pain, diarrhea and blood in stool.  Genitourinary: Negative for dysuria, urgency, decreased urine volume and difficulty urinating.  Musculoskeletal: Positive for arthralgias (Left thumb pain.).  Skin: Negative for rash.  Neurological: Negative for dizziness and syncope.  Psychiatric/Behavioral: Negative for suicidal ideas.       Objective:   Physical Exam  Constitutional: No distress.  Cardiovascular: Normal rate, regular rhythm and normal heart sounds.  Exam reveals no gallop and no friction rub.   No murmur heard. Pulmonary/Chest: Effort normal and breath sounds normal. No respiratory distress. She has no wheezes. She has no rales.  Abdominal: Soft. Bowel sounds are normal. She exhibits no  distension. There is no hepatosplenomegaly. There is no tenderness.  Musculoskeletal: She exhibits no edema.       Arms:         Assessment & Plan:  Preventive Care.  Patient will return for a fasting lipid panel tomorrow morning for screening.  I discussed Pneumovax and Zostavax with her, and she declined both.  She says that she had a tetanus booster within the past 5 years when she was hospitalized in [place].  A mammogram was scheduled.

## 2010-04-05 NOTE — Assessment & Plan Note (Signed)
Hemoglobin  Date Value Range Status  08/30/2009 10.4* (12.0-15.0 (g/dL) Final     Patient is followed at the anemia clinic; she reports that she is currently not getting injections there but is taking oral ferrous sulfate one tablet every fourth day as per their instructions.  The plan is to try to obtain copies of their notes to confirm medications and labs.  Will check a CBC with differential.

## 2010-04-05 NOTE — Assessment & Plan Note (Signed)
Patient is doing well with no symptoms of depression on current dose of bupropion; plan is to continue current medication regimen.

## 2010-04-05 NOTE — Assessment & Plan Note (Signed)
  BP Readings from Last 3 Encounters:  04/05/10 134/85  02/01/10 125/80  11/02/09 120/73    Assessment: Hypertension control:  controlled  Progress toward goals:  at goal Barriers to meeting goals:  no barriers identified  Plan: Hypertension treatment:  continue current medications

## 2010-04-05 NOTE — Assessment & Plan Note (Signed)
Patient reports intermittent pain in the dorsal aspect of the base of her left thumb that extends to her distal wrist, and has some tenderness over that area.  This may represent tendinitis or tenosynovitis; I advised referral to the sports medicine clinic for assessment and possible injection, but she declined.  She says that her pain is not severe and she prefers conservative management.  She did ask about a brace, and I suggested that she try a wrist brace of the type used for carpal tunnel syndrome to rest her thumb at night and see if this helps.  I also advised that she avoid lifting heavy objects to allow this time to improve.  She agreed to let me know if her symptoms worsen, in which case she may reconsider a referral.

## 2010-04-05 NOTE — Assessment & Plan Note (Signed)
Plan is check a comprehensive metabolic panel today.  Patient is also followed by her nephrologist Dr. Moshe Cipro, and I will forward a copy of labs to her.

## 2010-04-05 NOTE — Patient Instructions (Signed)
Please keep appointment for mammogram as scheduled. Please return tomorrow morning for fasting labs.

## 2010-04-06 ENCOUNTER — Other Ambulatory Visit: Payer: PRIVATE HEALTH INSURANCE

## 2010-04-06 DIAGNOSIS — Z13220 Encounter for screening for lipoid disorders: Secondary | ICD-10-CM

## 2010-04-06 DIAGNOSIS — D509 Iron deficiency anemia, unspecified: Secondary | ICD-10-CM

## 2010-04-06 DIAGNOSIS — N19 Unspecified kidney failure: Secondary | ICD-10-CM

## 2010-04-06 LAB — LIPID PANEL
Cholesterol: 177 mg/dL (ref 0–200)
HDL: 58 mg/dL (ref >39)
LDL Cholesterol: 96 mg/dL (ref 0–99)
Total CHOL/HDL Ratio: 3.1 Ratio
Triglycerides: 114 mg/dL (ref <150)
VLDL: 23 mg/dL (ref 0–40)

## 2010-04-06 LAB — CBC WITH DIFFERENTIAL/PLATELET
Basophils Absolute: 0 10*3/uL (ref 0.0–0.1)
Basophils Relative: 1 % (ref 0–1)
Eosinophils Absolute: 0.3 10*3/uL (ref 0.0–0.7)
Eosinophils Relative: 6 % — ABNORMAL HIGH (ref 0–5)
HCT: 36 % (ref 36.0–46.0)
Hemoglobin: 11.5 g/dL — ABNORMAL LOW (ref 12.0–15.0)
Lymphocytes Relative: 30 % (ref 12–46)
Lymphs Abs: 1.3 10*3/uL (ref 0.7–4.0)
MCH: 30.2 pg (ref 26.0–34.0)
MCHC: 31.9 g/dL (ref 30.0–36.0)
MCV: 94.5 fL (ref 78.0–100.0)
Monocytes Absolute: 0.5 10*3/uL (ref 0.1–1.0)
Monocytes Relative: 11 % (ref 3–12)
Neutro Abs: 2.3 10*3/uL (ref 1.7–7.7)
Neutrophils Relative %: 52 % (ref 43–77)
Platelets: 233 10*3/uL (ref 150–400)
RBC: 3.81 MIL/uL — ABNORMAL LOW (ref 3.87–5.11)
RDW: 15.6 % — ABNORMAL HIGH (ref 11.5–15.5)
WBC: 4.4 10*3/uL (ref 4.0–10.5)

## 2010-04-07 ENCOUNTER — Ambulatory Visit
Admission: RE | Admit: 2010-04-07 | Discharge: 2010-04-07 | Disposition: A | Discharge status: Home / Self Care | Payer: PRIVATE HEALTH INSURANCE | Source: Ambulatory Visit | Attending: Internal Medicine | Admitting: Internal Medicine

## 2010-04-07 DIAGNOSIS — Z1231 Encounter for screening mammogram for malignant neoplasm of breast: Secondary | ICD-10-CM

## 2010-04-07 LAB — COMPLETE METABOLIC PANEL WITH GFR
ALT: 10 U/L (ref 0–35)
AST: 17 U/L (ref 0–37)
Albumin: 4.3 g/dL (ref 3.5–5.2)
Alkaline Phosphatase: 139 U/L — ABNORMAL HIGH (ref 39–117)
BUN: 50 mg/dL — ABNORMAL HIGH (ref 6–23)
CO2: 27 mEq/L (ref 19–32)
Calcium: 9.5 mg/dL (ref 8.4–10.5)
Chloride: 104 mEq/L (ref 96–112)
Creat: 2.64 mg/dL — ABNORMAL HIGH (ref 0.40–1.20)
GFR, Est African American: 21 mL/min — ABNORMAL LOW (ref >60)
GFR, Est Non African American: 18 mL/min — ABNORMAL LOW (ref >60)
Glucose, Bld: 87 mg/dL (ref 70–99)
Potassium: 4.1 mEq/L (ref 3.5–5.3)
Sodium: 142 mEq/L (ref 135–145)
Total Bilirubin: 0.3 mg/dL (ref 0.3–1.2)
Total Protein: 7.1 g/dL (ref 6.0–8.3)

## 2010-05-16 NOTE — Op Note (Signed)
NAME:  [name], [name]               ACCOUNT NO.:  [id number]   MEDICAL RECORD NO.:  [id number]          PATIENT TYPE:  AMB   LOCATION:  SDS                          FACILITY:  [facility]   PHYSICIAN:  Judeth Cornfield. Scot Dock, M.D.DATE OF BIRTH:  Aug 18, 1934   DATE OF PROCEDURE:  01/17/2007  DATE OF DISCHARGE:                               OPERATIVE REPORT   PREOPERATIVE DIAGNOSIS:  Chronic renal failure.   POSTOPERATIVE DIAGNOSIS:  Chronic renal failure.   PROCEDURE:  1. Ultrasound-guided placement of a right intrajugular Diatek      catheter.  2. Removal of left intrajugular Diatek.   SURGEON:  Judeth Cornfield. Scot Dock, M.D.   ANESTHESIA:  Local with sedation.   TECHNIQUE:  The patient was taken to the operating room, sedated by  anesthesia.  The ultrasound scanner was used to mark the right internal  jugular vein, which appeared to be patent.  The neck and upper chest  were then prepped and draped in the usual sterile fashion.  After the  skin was infiltrated with 1% lidocaine, the right IJ was cannulated with  a finder needle.  I then attempted to cannulate the vein with a larger  needle but cannulated the artery.  The needle was removed.  Pressure  held for hemostasis for 10 minutes.  Next, the vein was then cannulated  again, and a guidewire introduced into the right atrium.  The exit tract  over the wire was dilated, and then the dilator and peel-away sheath  were passed over the wire, and the wire and dilator were removed.  The  catheter was passed through the peel-away sheath and positioned in the  right atrium.  The exit site for the catheter was selected.  The skin  anesthetized between the two areas.  The catheter was then brought  through the tunnel, cut to the appropriate length, and the distal ports  were attached. Both ports withdrew easily, were then flushed with  heparinized saline and filled with concentrated heparin.  The catheter  was secured in its exit site with a  3-0 nylon suture.  The IJ  cannulation site was closed with a 4-0 subcuticular stitch.  I then  removed the left IJ catheter, and pressure was held for hemostasis.  This did not change the position of the right IJ catheter.  Sterile  dressing was applied.  The patient tolerated the procedure well and was  transferred to the recovery room in satisfactory condition.  All needle  and sponge counts were correct.      Judeth Cornfield. Scot Dock, M.D.  Electronically Signed     CSD/MEDQ  D:  01/17/2007  T:  01/17/2007  Job:  [id number]

## 2010-05-16 NOTE — Op Note (Signed)
NAME:  [name], [name]               ACCOUNT NO.:  [id number]   MEDICAL RECORD NO.:  [id number]          PATIENT TYPE:  AMB   LOCATION:  SDS                          FACILITY:  [facility]   PHYSICIAN:  Judeth Cornfield. Scot Dock, M.D.DATE OF BIRTH:  Sep 28, 1934   DATE OF PROCEDURE:  DATE OF DISCHARGE:  01/17/2007                               OPERATIVE REPORT   PREOPERATIVE DIAGNOSIS:  Chronic renal failure.   POSTOPERATIVE DIAGNOSIS:  Chronic renal failure.   PROCEDURE:  1. Ultrasound guided placement of a right IJ Diatek catheter.  2. Removal of left IJ Diatek catheter.   SURGEON:  Judeth Cornfield. Scot Dock, M.D.   ASSISTANT:  Nurse.   ANESTHESIA:  Local with sedation.   PROCEDURE IN DETAIL:  The patient was taken to the operating room and  sedated by anesthesia.  The ultrasound scanner was used to mark the  right internal jugular vein which appeared to be patent.  The neck and  upper chest were prepped and draped in the usual sterile fashion.  After  the skin was anesthetized with 1% lidocaine I cannulated the IJ with a  finder needle.  I then attempted to cannulate the vein with the 18 gauge  needle and cannulated the artery.  The needle was removed, pressure held  for hemostasis for 10 minutes.  The vein was recannulated.  A guidewire  was passed into the right atrium under fluoroscopic control.  The track  over the wire was dilated and then the dilator and peel-away sheath were  passed over the wire and the wire and the dilator removed.  The catheter  was passed through the peel-away sheath and positioned in the right  atrium.  The exit site for the catheter was selected and the skin  anesthetized between the 2 areas.  The catheter was then brought through  the tunnel, cut to the appropriate length and the distal ports were  attached.  Both ports withdrew easily, were then flushed with  heparinized saline and filled with concentrated heparin.  The catheter  was secured at its exit  site with a 3-0 nylon suture.  The IJ  cannulation site was closed with a 4-0 subcuticular stitch.  A sterile  dressing was applied.  The patient tolerated the procedure well, was  transferred to the recovery room in satisfactory condition.  All needle  and sponge counts were correct.      Judeth Cornfield. Scot Dock, M.D.  Electronically Signed     CSD/MEDQ  D:  01/17/2007  T:  02/17/2007  Job:  [id number]

## 2010-05-19 NOTE — Op Note (Signed)
[name], [name]               ACCOUNT NO.:  [id number]   MEDICAL RECORD NO.:  [id number]          PATIENT TYPE:  INP   LOCATION:  2025                         FACILITY:  [facility]   PHYSICIAN:  Revonda Standard. Roxan Hockey, M.D.DATE OF BIRTH:  09-03-1934   DATE OF PROCEDURE:  11/01/2004  DATE OF DISCHARGE:                                 OPERATIVE REPORT   PREOPERATIVE DIAGNOSIS:  Pericardial effusion.   POSTOPERATIVE DIAGNOSIS:  Pericardial effusion.   PROCEDURE:  Subxiphoid pericardial window.   SURGEON:  Revonda Standard. Roxan Hockey, M.D.   ASSISTANT:  None.   ANESTHESIA:  General.   FINDINGS:  Approximately 200 mL of clear straw-colored fluid; thin, normal-  appearing pericardium; mildly inflammatory lymph node anterior to  pericardium sent for permanent section.   CLINICAL NOTE:  [name] is a 76 year old lady who presents with several  months of shortness of breath and decreased energy and weight loss.  She now  presents with chest pain and increased shortness of breath.  A CT scan  revealed a moderately large pericardial effusion and no evidence of  pulmonary embolus.  A 2-D echocardiogram revealed a significant pericardial  effusion and the patient was referred for subxiphoid pericardial window.  The indications, risks, benefits and alternatives were discussed in detail  with the patient; she understood and accepted the risks, and agreed to  proceed.   OPERATIVE NOTE:  [name] was brought to the preop holding area on  November 01, 2004.  There, intravenous access was obtained by the anesthesia  service and arterial blood pressure monitoring and catheter were placed.  She was taken to the operating room, anesthetized and intubated.  The  patient tolerated intubation well without any change in her hemodynamic  status.  The chest and abdomen then were prepped and draped in the usual  fashion.  An incision was made over the lower portion of the sternum over  the xiphoid process  and it was carried through the skin and subcutaneous  tissue.  The fascia was divided in the midline.  The xiphoid process then  was mobilized and upper retraction was placed on the xiphoid process.  The  diaphragm was gently retracted downward, exposing the pericardium.  There  was a fat pad over the pericardium with what appeared to be an inflammatory  lymph node; this was taken and sent as a separate specimen for permanent  pathology.  The pericardium then was incised and 200 mL of clear straw-  colored fluid were evacuated.  A small, approximately 2 x 3-cm section of  the pericardium was excised and also sent for permanent sections.  The fluid  was sent for cell counts, serologic studies, cultures and cytology.  There  was no hemodynamic change with drainage of the fluid.  A suction was then  gently probe throughout the pericardium to ensure that there were no  adhesions or areas of loculated fluid.  A 28-French right-angle chest tube  then was placed along the diaphragmatic surface of the pericardium.  The  incision was closed with  a running 0 Vicryl fascial suture, a running 2-0  Vicryl subcutaneous suture  and a 3-0 Vicryl subcuticular suture.  All sponge, needle and instrument  counts were correct at the end of the procedure.  The patient was taken from  the operating room to the postanesthetic care unit, extubated and in stable  condition.           ______________________________  Revonda Standard Roxan Hockey, M.D.     SCH/MEDQ  D:  11/01/2004  T:  11/02/2004  Job:  [id number]   cc:   Ludwig Lean. Doreatha Lew, M.D.  Fax: Foreman Rockwell Alexandria, M.D.  Fax: [phone]

## 2010-05-19 NOTE — H&P (Signed)
NAMEINDY, BITNER NO.:  [id number]   MEDICAL RECORD NO.:  [id number]          PATIENT TYPE:  INP   LOCATION:  5599                         FACILITY:  [facility]   PHYSICIAN:  Leafy Kindle, PA     DATE OF BIRTH:  1934/09/19   DATE OF ADMISSION:  08/21/2005  DATE OF DISCHARGE:                                HISTORY & PHYSICAL   REASON FOR ADMISSION:  Epigastric pain with nausea.   HISTORY OF PRESENT ILLNESS:  Ms. Abdellatif is a 76 year old African-American  female with a past medical history that is significant for hypertension,  gastritis with severe erosions, history of pericarditis status post  pericardial window, and end-stage renal disease, who was a recent start to  dialysis.  She initially started in [place] in August of this year and  transferred to [place] on August 10, 2005.  The patient presents to the emergency  room this morning with a chief complaint of severe epigastric pain that she  rates as an 8/10 on a pain scale.  It is accompanied by nausea but no  emesis.  The pain, she states, feels like indigestion type pain.  It does  not radiate.  It is not accompanied by diaphoresis.  She does have some  slight shortness of breath which she states is chronic.  She denies any  fever or chills.  She also describes some decrease in appetite over the last  few weeks.  She has had no change in bowel movements.  She denies melena or  hematochezia.  Of note, during the patient's hospitalization in [place] 3  weeks ago, the patient was treated for abdominal pain and underwent an EGD,  which showed severe gastritis and erosion, as well as duodenitis.  She was  placed on Protonix twice a day and Reglan four times a day.  She states that  she has been taking these as indicated.  Of note, the patient also underwent  a cardiac catheterization here at [facility] in January of this year by Dr.  Romeo Apple, and had normal coronary arteries at that time.   PAST MEDICAL  HISTORY:  1. End-stage renal disease.  2. Hypertension.  3. Gastritis/erosion/duodenitis.  The patient was followed by [name].  4. History of pericarditis status post pericardial window by Dr.      Roxan Hockey on November 01, 2004.  5. Gram positive cocci bacteremia diagnosed July of 2007.  6. History of anorexia.  7. History of respiratory failure in July of 2007.   ALLERGIES:  None.   MEDICATIONS:  1. Catapres patch.  2. BiDil daily.  3. Megace 40 mg daily.  4. Reglan 10 mg one p.o. t.i.d. a.c. and h.s.  5. Stool softener p.r.n.  6. Norvasc 10 mg daily.  7. Zolpidem 10 mg daily.  8. Metoprolol 50 mg b.i.d.   FAMILY HISTORY:  Significant for hypertension.   SOCIAL HISTORY:  The patient is currently separated.  She has 8 children.  She has 2 boys and 6 girls.  She has 26 grandchildren.  She lives here  in  [place], [place] with her 2 sons who are primary care givers.  She  is retired from [employer] independently.  She is a nonsmoker and does  not drink.   REVIEW OF SYSTEMS:  Please see HPI.  Also, the patient denies headache or  nasal discharge.  The patient denies dyspnea on exertion or cough or  congestion.  The patient denies frequency, urgency or dysuria, as well as  hematuria.  The patient does describe some mild depression and weakness.  This is chronic, per patient.   PHYSICAL EXAMINATION:  VITAL SIGNS:  Temperature 98.0, pulse 83,  respirations 22, blood pressure 170/106.  Oxygen saturation is 93% on room  air.  GENERAL:  Ms. Liao is a very pleasant 76 year old African-American female  who is alert and cooperative to questioning.  She is oriented x3 and in no  acute distress.  HEENT:  Head is atraumatic and normocephalic.  Extraocular muscles are  intact bilaterally.  NECK:  Supple with no JVD and no lymphadenopathy.  CARDIOVASCULAR:  Heart regular rate and rhythm.  Possible an S1 and S2 and  possibly an S4.  No murmurs are  auscultated.  LUNGS:  A few bibasilar crackles.  Otherwise, clear lung fields.  ABDOMEN:  Soft with active bowel sounds throughout.  The patient has point  tenderness in the superior portion of the epigastric region to light and  deep palpation.  She does have some voluntary guarding.  No rebound.  EXTREMITIES:  The patient has 1+ bilateral lower extremity edema.  No rashes  or lesions.  ACCESS:  The patient has a right chest permanent catheter.  NEUROLOGIC:  Alert and oriented x3.  Cranial nerves II-XII are grossly  intact.   ADMISSION LABORATORIES:  A white blood cell count of 7.2, hemoglobin 11.7,  hematocrit 35.7.  Sodium 135, potassium 5.4, creatinine 7.9, glucose 117,  calcium 9.8, albumin 3.3.  AST of 59, ALT of 19.  PT is 14.3, INR is 101.  Troponin is 0.05 x2.   ADMISSION CHEST X-RAY:  Cardio enlargement with congestive heart failure.   ASSESSMENT AND PLAN:  1. Epigastric pain with nausea and vomiting in the setting of erosive      gastritis.  Plan to admit patient.  Rule out cardiac source.  Troponins      have been less than 0.5 x2.  We will do a last cardiac marker in      dialysis.  Cardiac source is less likely, given normal coronary      arteries in January of 2007.  We will rule out pulmonary embolus, given      an increase in D-dimer, by a CT angiogram, which was performed in the      emergency room.  Results are pending.  Probably etiology appears GI      related.  Will follow.  Continue PPI, Reglan IV, comfort care measures      to include clear liquids.  We will culture urine and blood, given the      patient's recent bacteremia.  2. Hypertension.  Continue medications.  Increase ultrafiltration.  3. Gram positive cocci bacteremia, which originated in [place].  Recent      blood cultures have been negative.  Vancomycin is to stop on August 28, 2005.  4. End-stage renal disease.  Continue Tuesday, Thursday, Saturday      dialysis.  5. Anorexia.  Continue  Megace. 6. Anemia.  Continue Epogen.  7. Disposition  reevaluation after hemodialysis.  8. Advance directive - the patient is a full code.      Leafy Kindle, [place]     MY/MEDQ  D:  08/21/2005  T:  08/21/2005  Job:  [id number]   cc:   [facility]

## 2010-05-19 NOTE — Op Note (Signed)
NAME:  [name], [name]               ACCOUNT NO.:  [id number]   MEDICAL RECORD NO.:  [id number]          PATIENT TYPE:  AMB   LOCATION:  SDS                          FACILITY:  [facility]   PHYSICIAN:  Dorothea Glassman, M.D.    DATE OF BIRTH:  [birth year]   DATE OF PROCEDURE:  01/21/2006  DATE OF DISCHARGE:  01/21/2006                               OPERATIVE REPORT   SURGEON:  Gordy Clement, MD   ASSISTANT:  Richardson Dopp, PA.   ANESTHETIC:  Local with MAC.   PREOPERATIVE DIAGNOSES:  1. End-stage renal failure.  2. Clotted left forearm loop arteriovenous graft.   POSTOPERATIVE DIAGNOSES:  1. End-stage renal failure.  2. Clotted left forearm loop arteriovenous graft.   PROCEDURE:  Thrombectomy and revision of left forearm loop arteriovenous  graft.   OPERATIVE PROCEDURE:  The patient was brought to the operating room in  stable condition.  Placed in supine position.  Left arm prepped and  draped in a sterile fashion.   Skin and subcutaneous tissues were instilled with 1% Xylocaine with  epinephrine.  A longitudinal skin incision was made through the scar and  left antecubital fossa.  Dissection was carried down through the  subcutaneous tissue.  The venous limb of the graft was identified.  This  was followed down to an end-to-side anastomosis to deep brachial vein.  The vein, however, was small in caliber.  The venous cuff was ligated  with 2-0 silk and divided.  The graft was thrombectomized several times  with a 4 Fogarty catheter.  Excellent inflow was obtained, the graft  filled with heparin and saline solution and controlled with a fistula  clamp.  Further exploration through the incision revealed a larger  basilic vein.  This was freed proximally in the arm, encircled  proximally and distally with Vesseloops, controlled with bulldog clamps.  A longitudinal venotomy was made.  A new segment of 6-mm Gore-Tex was  anastomosed end-to-end to the divided graft using a running  6-0 Prolene  suture.  This was then anastomosed end-to-side to the basilic vein using  a running 6-0 Prolene suture.  Clamps were then removed.  Excellent flow  was present.  Adequate hemostasis was obtained.  Sponges and instrument  counts were correct.   Subcutaneous tissue was closed with a running 3-0 Vicryl suture in a  single layer.  Skin was closed with 4-0 Monocryl.  Steri-Strips were  applied.   The patient tolerated the procedure well.  No apparent complications.  Transferred to recovery room in stable condition.      Dorothea Glassman, M.D.  Electronically Signed     PGH/MEDQ  D:  01/21/2006  T:  01/22/2006  Job:  [id number]

## 2010-05-19 NOTE — Op Note (Signed)
NAME:  [name], [name]               ACCOUNT NO.:  [id number]   MEDICAL RECORD NO.:  [id number]          PATIENT TYPE:  AMB   LOCATION:  SDS                          FACILITY:  [facility]   PHYSICIAN:  Charles E. Fields, MD  DATE OF BIRTH:  11/14/1934   DATE OF PROCEDURE:  11/28/2005  DATE OF DISCHARGE:                               OPERATIVE REPORT   PROCEDURE:  Left forearm arteriovenous graft.   PREOPERATIVE DIAGNOSIS:  End-stage renal disease.   POSTOPERATIVE DIAGNOSIS:  End-stage renal disease.   ANESTHESIA:  Local with IV sedation.   ASSISTANT:  Jacinta Shoe, P.A.   OPERATIVE FINDINGS:  1. 3-mm deep brachial vein.  2. 3-mm brachial artery.  3. 4 to 7-mm tapered PTFE graft.   OPERATIVE DETAIL:  After obtaining informed consent, the patient taken  to the operating room.  The patient was placed in supine position on the  operating table.  After adequate sedation, the patient's entire left  upper extremity prepped and draped usual sterile fashion.  Local  anesthesia was infiltrated near the antecubital crease.  A longitudinal  incision was made in this location and carried down through subcutaneous  tissues down to the level of the brachial artery.  Brachial artery was  dissected free circumferentially and controlled with vessel loops.  The  adjacent deep brachial vein was identified as approximately 3 mm in  diameter.  This was dissected free circumferentially and small side  branches ligated and divided between silk ties.  Next a subcutaneous  tunnel was created in a loop configuration down the forearm with a  transverse incision in the distal forearm for assistance in tunneling.  The tunnel was placed slightly deeper than usual due to the patient  having very thick leathery skin that did not seem conducive to easy  wound healing.  Next the 4 to 7-mm tapered PTFE graft was brought  through the subcutaneous tunnel with the 4 mm arterial end on the radial  aspect of the  forearm.  The patient was then given 5000 units of  intravenous heparin.  Vessel loops were pulled taut on the brachial  artery.  Longitudinal arteriotomy was made.  The 4 mm end of the graft  was slightly beveled.  Graft was then sewn end graft to side of artery  using a running 6-0 Prolene suture.  Just prior to completion of  anastomosis this was fore bled, back bled and thoroughly flushed.  Anastomosis was secured.  Clamp was moved down to the apex of the graft.  Next the graft was pulled taut to length.  The vein was controlled  proximally and distally with fine bulldog clamps.  Longitudinal venotomy  was made.  Graft was beveled and sewn end graft to side of vein using a  running 6-0 Prolene suture.  Just prior to completion anastomosis this  was fore bled, back bled and thoroughly flushed.  Anastomosis was  secured, clamp was released.  There was pulsatile flow within the graft  immediately.  Next the radial and ulnar arteries were inspected with  Doppler.  There was significant augmentation of  the radial and ulnar  Doppler signals with compression of the graft.  Hand was adequately  perfused.  Hemostasis was obtained.  Subcutaneous tissues of both  incisions reapproximated using running 3-0 Vicryl suture.  Skin was  closed with 4-0 Vicryl subcuticular stitch of both  incisions.  No Benzoin or Steri-Strips were applied to the fragile skin.  The patient tolerated procedure well and there were no complications.  Instrument, sponge and needle count was correct at end of the case.  The  patient taken to recovery room in stable condition.      [name], MD  Electronically Signed     CEF/MEDQ  D:  11/28/2005  T:  11/28/2005  Job:  [id number]

## 2010-05-19 NOTE — Discharge Summary (Signed)
[name], [name]               ACCOUNT NO.:  [id number]   MEDICAL RECORD NO.:  [id number]          PATIENT TYPE:  INP   LOCATION:  2025                         FACILITY:  [facility]   PHYSICIAN:  Ludwig Lean. Doreatha Lew, M.D.DATE OF BIRTH:  Feb 11, 1934   DATE OF ADMISSION:  10/30/2004  DATE OF DISCHARGE:  11/04/2004                                 DISCHARGE SUMMARY   PRIMARY DISCHARGE DIAGNOSIS:  Pericardial effusion with subsequent  pericardial window per Dr. Roxan Hockey on November 01, 2004.   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   HISTORY OF PRESENT ILLNESS:  The patient is a very pleasant 76 year old  black female who presented to the emergency room with an approximately 2-3  day history of chest discomfort.  It was described as an indigestion and  pressure-like sensation.  It occurred while she and her husband [place] the [place] of admission.  She felt somewhat  hot and sweaty and her symptoms were certainly worse with deep inspiration.  She had taken lots of antacids as well as laxatives with no real response.  When she was brought to the emergency room, cardiac markers were negative.  Her D-dimer was elevated.  She subsequently underwent CT scanning of the  chest which showed a moderately large pericardial effusion.  She has had no  recent URI.  She was subsequently admitted for further evaluation.  She had  noted over the last several weeks that she had lost weight.  She really had  had no appetite but attributed that to a busy lifestyle.  She was also  complaining of discoloration over the elbows, knees and hands.   Please see dictated history and physical for further patient presentation  and profile.   LABORATORY DATA ON ADMISSION:  CBC showed a hemoglobin of 11, hematocrit was  34, platelets were 316, white count was 8,000.  PT and PTT were normal.  Magnesium was 2.2.  D-dimer was 1.36.  Sodium 140, potassium  3.6, chloride  105, CO2 26, BUN 18, creatinine 0.7 and glucose of 99.   EKG showed no acute changes with sinus tachycardia.   CT of the chest showed no evidence of pulmonary emboli.  There was a  moderate-sized pericardial effusion.   HOSPITAL COURSE:  The patient was admitted to our service.  2-D  echocardiogram was ordered.  He was ruled out negative for myocardial  infarction.  We did speak to her rheumatologist, [name], who reported  he had done a recent workup with negative RA, negative ANA and a negative  sed rate.   A 2-D echocardiogram was subsequently performed and showed a large  pericardial effusion.  [facility] consultation was subsequently called and the  patient underwent pericardial window via a subxiphoid approach by Dr. Merilynn Finland on November 01, 2004.  That procedure was tolerated well without  any known complications.  Cytology and pathology were basically  unremarkable.   Postoperatively, she basically did well without any problems.  By November 04, 2004, she was up and ambulatory without problems and was felt to  be a  stable candidate for discharge.   DISCHARGE CONDITION:  Stable.   DISCHARGE MEDICINES:  1.  Toprol-XL 25 mg a day.  2.  Indocin 25 mg three times a day.  3.  Vitamin daily.  4.  Ambien as needed.   Her activity is to be no driving and no lifting more than 10 pounds.   DIET:  To be low fat, low salt.   She is to follow up in our office in approximately two weeks upon discharge  and a follow-up appointment with Dr. Roxan Hockey will be arranged as an  outpatient.  She is also advised to touch base with Dr. Knute Neu as  well.      Doyle Askew, N.P.      Ludwig Lean. Doreatha Lew, M.D.  Electronically Signed    LC/MEDQ  D:  01/15/2005  T:  01/15/2005  Job:  [id number]   cc:   Revonda Standard. Roxan Hockey, M.D.  [address]  [place] [address]   Peter M. Rockwell Alexandria, M.D.  Fax: [phone]

## 2010-05-19 NOTE — Consult Note (Signed)
NAME:  [name], [name]               ACCOUNT NO.:  [id number]   MEDICAL RECORD NO.:  [id number]          PATIENT TYPE:  INP   LOCATION:  2550                         FACILITY:  [facility]   PHYSICIAN:  Revonda Standard. Roxan Hockey, M.D.DATE OF BIRTH:  04-Aug-1934   DATE OF CONSULTATION:  11/01/2004  DATE OF DISCHARGE:                                   CONSULTATION   CARDIOVASCULAR THORACIC SURGERY CONSULTATION   REASON FOR CONSULTATION:  Pericardial effusion.   HISTORY OF PRESENT ILLNESS:  [name] is a 76 year old lady who presents  with a chief complaint of chest pain and shortness of breath.  Approximately  four months ago she developed a painful rash on her palms and elbows.  She  also started having problems with shortness of breath around that time.  She  was not having any chest pain at that time.  She has noted increased fatigue  over the past month or so.  On Saturday, she was at the [employer] when  she started having chest discomfort which was worsened with deep  inspiration.  She tried antacids and laxatives without success.  She has had  what she felt were hot sweats but no fevers.  She also has lost significant  weight over the past two to three months.  On Sunday, her symptoms worsened  with increased shortness of breath and chest pain and she came to the  emergency room on Monday.  A CT scan angio was done secondary to elevated D-  dimer and her symptoms and it showed no evidence of pulmonary embolus but  she did have a moderately large pericardial effusion.  She had a 2-  dimensional echocardiogram done yesterday which showed some questionable  thickening of the right ventricular anterior wall but also a moderately  large effusion with some early echocardiogram signs of tamponade but no  clinical evidence of tamponade.   PAST MEDICAL HISTORY:  Significant for hysterectomy, arthritis and  inflammatory rash which is currently under evaluation.   MEDICATIONS:  Her  medications are prednisone 5 mg t.i.d., multivitamin one  daily.   ALLERGIES:  No drug allergies.   FAMILY HISTORY:  Significant for myocardial infarction in her father and  brother.   SOCIAL HISTORY:  She is married.  She has 8 children.  She does not smoke or  drink.  She does work.   REVIEW OF SYSTEMS:  She has lost weight over the past two to three months.  She does have some mild orthopnea.  No paroxysmal nocturnal dyspnea.  She  has had poor appetite.  Pain, tingling and warmth over her elbows and palms  and top of her feet.  Decreased energy level.  All other systems are  negative.   PHYSICAL EXAMINATION:  GENERAL APPEARANCE:  [name] is a 76 year old  African-American female in no acute distress.  She is well-developed and  well-nourished.  She is tachypneic and tachycardic with heart rate 110.  HEENT:  Examination is unremarkable.  NECK:  She is sitting directly upright and there is no obvious jugular  venous distention.  LUNGS:  Clear.  CARDIAC:  Tachycardic.  No rubs or murmurs.  ABDOMEN:  Soft, nontender.  EXTREMITIES:  There is no edema.  She has discoloration of the dorsum of her  hands and feet and she has a dark rash on her palms.   LABORATORY DATA:  She has had negative rheumatoid factor, ANA and a sed rate  was normal.  A C-reactive protein was elevated at 8.6.  White count 8.0,  Hematocrit 34. Platelet count 316,000.  Sodium 140.  Potassium 3.6.  BUN and  creatinine 18 and 0.7.  PTT and Pro Time within normal limits.  D-dimer was  1.36.  Cardiac enzymes were negative.  Electrocardiogram shows sinus  tachycardia.  Chest CT scan as previously mentioned, showed pericardial  effusion.  OT and PT were mildly elevated at 76 and 116.  Bilirubin is  normal.   IMPRESSION:  [name] is a 76 year old lady who presents with a four month  history of a strange rash on her palms and elbows.  She currently is in the  process of having that worked up with rheumatologic  evaluation and is on  prednisone for that.  She now presents with chest pain and increasing  shortness of breath and has ruled out for myocardial infarction and has no  evidence of pulmonary embolus but she does have a moderately large  pericardial effusion with some early echocardiographic signs of impending  tamponade.  Clinically she is not in tamponade but she is mildly tachycardic  and definitely tachypneic.  She needs a subxiphoid pericardial window for  diagnosis and treatment.  I have discussed with her the indications, risks,  benefits and alternatives.  She understands the risks of surgery include  bleeding, possible need for transfusion, infection, cardiac injury, deep  venous thrombosis, pulmonary embolus and other unpredictable complications,  potential death although very unlikely in this setting.  She understands and  accepts these risks and agrees to proceed.  Will plan to add her on to the  operating schedule today and do this as soon as possible.           ______________________________  Revonda Standard. Roxan Hockey, M.D.     SCH/MEDQ  D:  11/01/2004  T:  11/01/2004  Job:  [id number]   cc:   Ander Slade. Rockwell Alexandria, M.D.  Fax: [phone]

## 2010-05-19 NOTE — Discharge Summary (Signed)
NAME:  [name], [name]               ACCOUNT NO.:  [id number]   MEDICAL RECORD NO.:  [id number]          PATIENT TYPE:  INP   LOCATION:  5501                         FACILITY:  [facility]   PHYSICIAN:  C. Milta Deiters, M.D.DATE OF BIRTH:  1934/08/06   DATE OF ADMISSION:  12/12/2004  DATE OF DISCHARGE:  12/15/2004                                 DISCHARGE SUMMARY   DISCHARGE DIAGNOSIS:  1.  Hypertension.  2.  Pericarditis.  3.  Obesity.  4.  Normocytic anemia.  5.  Diabetes.  6.  Chest pain of unknown etiology most likely secondary to pericarditis.  7.  Palmar plantar rash of unknown etiology, considered secondary to viral      infection.   DISCHARGE MEDICATIONS:  1.  Prednisone 5 mg p.o. t.i.d.  2.  Ultram 50 mg p.o. b.i.d.  3.  Protonix 40 mg p.o. daily.  4.  Triamcinolone topical.  5.  Indomethacin 25 mg p.o. t.i.d. p.r.n.   HISTORY OF PRESENT ILLNESS:  Ms. Stapf is a 76 year old African American  lady who was recently admitted to [facility] with 2-3 day history  of chest pain described as pressure with indigestion but found to be  moderately large pericardial effusion.  At the time, she had negative  cardiac enzymes and increased D-dimer.  According to the fluid analysis, the  pericardial fluid was an exudate.  The patient complained of positional  increase chest pressure and shortness of breath over the last week prior to  admission.  She called her cardiologist on the day of admission because she  was unsure if this was reminiscent of her pericardial pain versus something  else.  The cardiologist instructed her to come to the emergency room.  The  patient had a cold the week prior to admission associated with a cough but  no other associated symptoms which could explain her chest pain.  She has  had a history of foot swelling and palmar plantar rash which was  erythematous involving hands and soles, but no joints.  She also had some  elbow swelling.   ALLERGIES:  None known.   PAST MEDICAL HISTORY:  Pericardial effusion status post xiphoid pericardial  window, hysterectomy, hypertension, cataracts, and bilateral vitreous  detachment, question of rheumatologic disease, rheumatoid factor  persistently negative, ANA and ASR within normal limits.  C-reactive protein  8.6 in November 2006.  Scleroderma antibody negative.  On October 31, 2004,  2D echo showed moderately large free flowing echogenicity, pericardial  effusion.   PHYSICAL EXAMINATION:  On admission, temperature 98.7, blood pressure  144/78, heart rate 86, respiratory rate 20, O2 saturation 96% on room air.  General:  The patient was in mild distress, sitting forward.  Eyes:  Extraocular movements intact, anicteric, she wears glasses.  ENT:  Oropharynx clear, poor dentition with exposed root of upper molar on the  left.  Neck supple, no JVD, no carotid bruits.  Respiratory:  Clear to  auscultation.  Cardiovascular:  Regular rate and rhythm, no murmurs,  gallops, and rubs.  Loud S1 and S2.  GI:  Nontender, nondistended, positive  bowel  sounds.  Extremities:  Hands and feet swollen, macular rash  hyperpigmentation.  Skin warm, dry.  Lymphs:  No lymphadenopathy.  Neurological:  Cranial nerves 2-12 grossly intact, nonfocal.  Musculoskeletal strength 5/5.  Psychiatry:  Appropriate.   ADMISSION LABORATORY DATA:  Sodium 139, potassium 3.7, chloride 106, bicarb  28, BUN 8, creatinine 0.7 , glucose 118.  Hemoglobin 11.6, white blood cell  count 8.2, platelets 292.  Cardiac enzymes negative.  PTT 25, PT 14.7, INR  1.1.  EKG normal sinus rhythm.  Pericardial effusion studies on November 01, 2004, showed glucose 120 mg per dl, total protein 4.5 grams per dl, uric  acid 4.2, amylase 44, cell count showed clear effusion, few white blood  cells, few leukocytes, few monocytes.   CONSULTATIONS:  [facility].   IMAGING THIS ADMISSION:  2D echo on December 13, 2004, showed left   ventricular systolic function normal, left ventricular ejection fraction 55-  65%, no left ventricular regional wall motion abnormalities, left  ventricular wall thickness mildly increased, aortic valve sclerosis mildly  increased, left atrium moderately dilated, there was no significant  pericardial effusion.  Chest x-ray on December 12, 2004, was stable, no  active disease.  EKG on December 13, 2004, at 6:21 showed normal sinus  rhythm, normal EKG, no significant pain since previous tracing.   HOSPITAL COURSE:  Problem 1:  Chest pain.  Cardiac enzymes have been negative x 3 throughout  hospitalization.  EKG showed no significant changes and 2D echo ruled out  pericarditis.  Other causes of potentially life threatening conditions in  the differential diagnosis were ruled out, there was no sign of pneumothorax  on the x-ray or aortic dissection concern.  The patient was discharged in  stable condition.  Her chest pain subsided and resolved prior to discharge  and the patient is supposed to follow up with [facility] for a  Cardiolite as an outpatient.   Problem 2:  Hypertension was well controlled throughout hospitalization.   Problem 3:  Normocytic anemia:  The patient is to follow up with her primary  care physician as an outpatient for further evaluation and possible  colonoscopy given her age and the fact that she has not had any formal  studies prior to this.   DISCHARGE LABORATORY DATA:  Sodium 139, potassium 3.9, chloride 106, bicarb  28, BUN 11, creatinine 0.7, glucose 102.  Hemoglobin 10, white blood cell  count 7, platelets 286.  BNP 376.      Carolin Guernsey, MD    ______________________________  C. Milta Deiters, M.D.    DA/MEDQ  D:  12/18/2004  T:  12/19/2004  Job:  [id number]

## 2010-05-19 NOTE — Op Note (Signed)
[name], [name]               ACCOUNT NO.:  [id number]   MEDICAL RECORD NO.:  [id number]          PATIENT TYPE:  INP   LOCATION:  2007                         FACILITY:  [facility]   PHYSICIAN:  James L. Rolla Flatten., M.D.DATE OF BIRTH:  May 25, 1934   DATE OF PROCEDURE:  04/20/2005  DATE OF DISCHARGE:  04/21/2005                                 OPERATIVE REPORT   PROCEDURE:  Esophagogastroduodenoscopy.   MEDICATIONS:  1.  Fentanyl 50 mcg IV.  2.  Versed 4 mg IV.  3.  Cetacaine spray.   INDICATIONS FOR PROCEDURE:  Subacute GI bleed, has been taking a lot of BC  Powders.   DESCRIPTION OF PROCEDURE:  The procedure explained to the patient and  consent obtained.  The patient in the left lateral decubitus position,  Olympus endoscope was inserted blindly into the esophagus, advanced under  direct visualization.  The stomach was entered, the pylorus identified and  passed, no bleeding at all.  Diffuse gastric erythema and a few erosions and  gastritis of the antrum and body of the stomach consistent with nonsteroidal  gastropathy, no ulcerations were seen.  In the proximal stomach, a small AVM  was seen that was not actively bleeding.  The esophagus was normal and no  varices, ulcerations, etc.  The scope was withdrawn, and the patient  tolerated the procedure well.   ASSESSMENT:  Diffuse gastritis probably secondary to nonsteroidals.   PLAN:  Will give proton pump inhibitors, follow clinically, may need a  colonoscopy in the future.  We will go ahead and advance diet.           ______________________________  Joyice Faster. Rolla Flatten., M.D.     [name]  D:  04/20/2005  T:  04/21/2005  Job:  [id number]

## 2010-05-19 NOTE — Consult Note (Signed)
[name], [name]               ACCOUNT NO.:  [id number]   MEDICAL RECORD NO.:  [id number]          PATIENT TYPE:  INP   LOCATION:  2007                         FACILITY:  [facility]   PHYSICIAN:  James L. Rolla Flatten., M.D.DATE OF BIRTH:  1934/10/09   DATE OF CONSULTATION:  04/20/2005  DATE OF DISCHARGE:                                   CONSULTATION   REASON FOR CONSULTATION:  GI bleed.   HISTORY:  The patient is a 76 year old African American female who was  hospitalized in November 2006 and in January 2007 by Dr. Romeo Apple,  her cardiologist. She has had a diagnosis of an autoimmune disorder and has  a pericardial effusion with a pericardial window. She has had a fairly  complex course with this and has seen a number of physicians. Apparently the  exact etiology and definition of her autoimmune disorder is undetermined.  She had been treated with prednisone, had problems with that, and stopped.  For some time she has been taking a lot of Goody Powders due to muscle aches  and pains. She has not had any clear melena or abdominal pain, vomiting,  etc. She came into the clinic for routine follow-up with progressive  fatigue, weight loss and has loss 20 or 25 pounds, without any other  specific symptoms. She denies melena or hematochezia. In the clinic her  hemoglobin was found to be 7.6 and was 11 in January. A repeat hemoglobin  here today was 9.9 after a couple of units of blood. We are asked to see her  regarding further evaluation of her anemia.   MEDICATIONS ON ADMISSION:  Tums, Goody Powder, and steroid cream for rash.   ALLERGIES:  FLU SHOT.   PAST MEDICAL HISTORY:  1.  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  2.  Pericardial effusion.  3.  Chronic arthritis.  4.  Skin lesion from some type of undetermined collagen vascular disease.   PAST SURGICAL HISTORY:  Previous hysterectomy.   FAMILY HISTORY:  Father died of heart attack. Mother in her late 4s of  natural causes. Heart disease in the family. No family history of colon  cancer.   SOCIAL HISTORY:  She is married to her second husband. She has eight  children. One has throat cancer. She does not smoke or drink.   PHYSICAL EXAMINATION:  VITAL SIGNS: Temperature 97, blood pressure 150/70,  pulse 100.  GENERAL: A very pleasant, elderly African American female.  HEENT: Sclera nonicteric. Throat normal.  NECK: Supple.  HEART: Regular rate and rhythm without murmurs or gallops.  LUNGS: Clear.  ABDOMEN: Soft and nontender.  RECTAL EXAM: Not performed.   PERTINENT LABORATORIES:  Hemoglobin 7.6 yesterday. She has been transfused  and it was 9.9  this morning. Protime is slightly elevated at 14.   ASSESSMENT:  Gastrointestinal bleeding and weight loss probably due to  ulceration or gastritis due to the [place] that she has been taking. I  agree that an upper endoscopy would be a good first step.   PLAN:  Will proceed with upper endoscopy later this morning. I  have  discussed this with the patient and she is agreeable.           ______________________________  Joyice Faster. Rolla Flatten., M.D.     [name]  D:  04/20/2005  T:  04/20/2005  Job:  [id number]

## 2010-05-19 NOTE — H&P (Signed)
NAME:  [name], [name]               ACCOUNT NO.:  [id number]   MEDICAL RECORD NO.:  [id number]           PATIENT TYPE:   LOCATION:                                 FACILITY:   PHYSICIAN:  Ludwig Lean. Doreatha Lew, M.D.    DATE OF BIRTH:   DATE OF ADMISSION:  01/10/2005  DATE OF DISCHARGE:                                HISTORY & PHYSICAL   CHIEF COMPLAINT:  Fatigue and shortness of breath.   HISTORY OF PRESENT ILLNESS:  The patient is a very pleasant 76 year old  African-American female.  She has had recent diagnosis of probable  autoimmune disorder and has had a subsequent pericardial effusion.  She  underwent pericardiocentesis with pericardial window/stripping per Dr. Merilynn Finland on November 01, 2004.  She has been readmitted to the hospital  towards the middle part of December with recurrent chest pain and shortness  of breath.  Her 2-D echocardiogram was basically satisfactory.  She has been  referred for an outpatient Cardiolite test which demonstrates an anterior  perfusion defect.  The polar mapping does suggest ischemia.  The ejection  fraction is normal at 63%.  She is now referred for cardiac catheterization.   PAST MEDICAL HISTORY:  1.  Status post hysterectomy.  2.  Questionable auto-immune disease.  3.  Pericardial effusion status post pericardial window November of 2006.  4.  Arthritis.   ALLERGIES:  None.   CURRENT MEDICATIONS:  None.   FAMILY HISTORY:  Father died at 53 with a massive heart attack.  Mother died  age 33 of unknown causes, but presumed to be natural.  One brother died age  30 with heart attack as well.   SOCIAL HISTORY:  She is married to her second husband.  She has eight  children ranging from 75 to 67 years of age.  One of her sons is undergoing  treatment for throat cancer.  She has no smoking or alcohol history.  She is  employed by cleaning houses.   REVIEW OF SYSTEMS:  She continues to have marked weakness and fatigue.  She  stopped  all of her steroids and nonsteroidals over the past week.  She  states it was not agreeing with her.  She has had diffuse myalgias with no  real appetite, but has not really exhibited weight loss.  She has had some  shortness of breath with exertion.  She has had no cough, no  lightheadedness, no dizziness, and no syncope.  She has had basically no  symptoms at rest.   PHYSICAL EXAMINATION:  VITAL SIGNS:  Weight 172, blood pressure 130/80  sitting, 120/90 standing, heart rate 100, respirations 18.  She is afebrile.  SKIN:  Warm and dry.  Color is unremarkable.  LUNGS:  Basically clear.  HEART:  Regular rhythm.  ABDOMEN:  Soft, positive bowel sounds, nontender.  EXTREMITIES:  Without edema.  NEUROLOGIC:  Intact.  There are no gross focal deficits.   LABORATORIES:  Pending.   OVERALL IMPRESSION:  1.  Abnormal adenosine Cardiolite.  2.  Recent pericardial effusion status post pericardial window November  2006.  3.  Possible autoimmune disorder.   PLAN:  Will proceed on with left and right heart catheterization.  The  procedure has been reviewed in full detail including the risks and benefits  and she is willing to proceed on January 10, 2005.      Doyle Askew, N.P.      Ludwig Lean. Doreatha Lew, M.D.  Electronically Signed    LC/MEDQ  D:  01/03/2005  T:  01/03/2005  Job:  [id number]   cc:   Ander Slade. Rockwell Alexandria, M.D.  Fax: [phone]

## 2010-05-19 NOTE — H&P (Signed)
[name], Cole               ACCOUNT NO.:  [id number]   MEDICAL RECORD NO.:  [id number]          PATIENT TYPE:  INP   LOCATION:  3709                         FACILITY:  [place]   PHYSICIAN:  Ludwig Lean. Doreatha Lew, M.D.DATE OF BIRTH:  01/08/1934   DATE OF ADMISSION:  10/30/2004  DATE OF DISCHARGE:                                HISTORY & PHYSICAL   CHIEF COMPLAINT:  Chest pain.   HISTORY OF PRESENT ILLNESS:  [name] is a very pleasant 76 year old  black female who really has no significant past medical history.  She  presents to the emergency department today with an approximate two to three  day history of chest pain.  It is described as an indigestion and pressure-  like sensation.  It basically started on Saturday while she and her husband  were walking around at the [employer].  She noted that she felt  somewhat hot and sweaty.  The symptoms seemed to wax and wane throughout the  course of the day and then reoccurred on Sunday while she was at church.  They are certainly worse with deep inspiration.  She has taken lots of  antacids as well as laxatives with really no response.  She was brought to  the emergency room for further evaluation.  Her cardiac markers were  negative.  A D-dimer was elevated.  She subsequently had CT angiogram of the  chest which showed a moderately large pericardial effusion.  She denies any  recent URI.   PAST MEDICAL HISTORY:  Status post hysterectomy.  She has had no other  surgeries.  No history of hypertension, cancer, stroke, or diabetes.  She  has recently been diagnosed with arthritis of the hands and currently has an  evaluation in process.   ALLERGIES:  No known drug allergies.   CURRENT MEDICATIONS:  1.  Prednisone 5 mg b.i.d. (supposed to be taking on a t.i.d. dosing      schedule).  2.  Vitamins daily.   FAMILY HISTORY:  Father died at 32 with a massive heart attack.  Mother died  at 56 of unknown causes, but presumed  natural.  One brother died at 37 with  heart attack as well.   SOCIAL HISTORY:  She is married to her second husband.  She has eight  children currently ranging from 37 to 35 years of age.  One of her son's  currently is undergoing treatment for throat cancer.  There is no smoking or  alcohol use.  She is employed by cleaning houses.   REVIEW OF SYSTEMS:  She does note some weight loss over the past two to  three months.  She really has no appetite, but attributes that to be more  busy with her lifestyle as well as caring for her son.  She has had no  recent cough.  She has questionably had some fever and chills.  She has been  short of breath at least for one month, and notes that over the past four  months or so she has had some type of rheumatalogical problem.  Her elbows  have  become hot and tingling, and subsequently has developed discoloration,  as well as her hands and the top of her feet.  She has also had a tingling-  type sensation as well.  She does report a decrease in her energy.  She has  previously never had any complaints of chest pain.   PHYSICAL EXAMINATION:  GENERAL:  She is very pleasant.  VITAL SIGNS:  Blood pressure is 119/75, heart rate is in the 90s, she is  afebrile.  SKIN:  Warm and dry.  Color is unremarkable.  LUNGS:  Clear.  HEART:  Regular rhythm, she is slightly tachycardic.  She does have some  slight neck vein prominence.  ABDOMEN:  Obese, yet soft, positive bowel sounds.  EXTREMITIES:  Without edema.  She does have dark discolorations over the  tops of her feet, as well as the palms of her hands and her elbows.  NEUROLOGIC:  Intact.  There are no gross focal deficits.   LABORATORY DATA:  CBC shows a hemoglobin of 11, hematocrit is 34, white  count is 8000, platelets are 316.  Chemistries are normal.  PT and PTT are  normal.  Magnesium was 2.2.  D-dimer is 1.36.  Cardiac markers are negative  x3.  EKG shows sinus tachycardia.  There are no acute  changes.   A CT scan of the chest showed no evidence of PE.  There was a moderately  large pericardial effusion.   IMPRESSION:  1.  Chest pain with shortness of breath.  2.  Pericardial effusion.  3.  Arthritis with questionable auto-immune process.   PLAN:  We will proceed on with 2-D echocardiogram, labs will be checked.  We  have called Dr. Leana Roe office.  Apparently, she has had significant  laboratory data drawn here in the last several weeks with a negative RA,  negative ANA, negative sedimentation rate.  Her CRP is reportedly 8.6, and  those records can be faxed to our attention.  She will be admitted for  observation.  She will be given prednisone as well as non-steroidal's.      Doyle Askew, N.P.      Ludwig Lean. Doreatha Lew, M.D.  Electronically Signed    LC/MEDQ  D:  10/30/2004  T:  10/30/2004  Job:  [id number]   cc:   Ander Slade. Rockwell Alexandria, M.D.  Fax: [phone]

## 2010-05-19 NOTE — Op Note (Signed)
NAME:  AHMYAH, MELSON               ACCOUNT NO.:  [id number]   MEDICAL RECORD NO.:  [id number]          PATIENT TYPE:  AMB   LOCATION:  SDS                          FACILITY:  [facility]   PHYSICIAN:  Charles E. Fields, MD  DATE OF BIRTH:  [birth year]   DATE OF PROCEDURE:  03/25/2006  DATE OF DISCHARGE:                               OPERATIVE REPORT   PROCEDURE:  1. Insertion of left internal jugular vein Diatek catheter.  2. Attempted right internal jugular vein Diatek catheter.  3. Neck ultrasound.   PREOPERATIVE DIAGNOSIS:  End-stage renal disease.   POSTOPERATIVE DIAGNOSIS:  End-stage renal disease.   ANESTHESIA:  Local with IV sedation.   OPERATIVE FINDINGS:  1. Presumed occlusion of proximal right internal jugular vein.  2. 23 cm Diatek catheter, left internal jugular vein.   OPERATIVE DETAILS:  After obtaining informed consent, the patient was  taken to the operating room.  The patient was placed in the supine  position on the operating table.  After adequate sedation the patient's  neck was inspected with an ultrasound.  The right internal jugular vein  had normal compressibility and respiratory variation.  The patient's  entire neck and chest were prepped and draped using sterile fashion.  Local anesthesia was infiltrated over the right internal jugular vein.  A small finder needle was used to localize the right internal jugular  vein.  A larger introducer needle was introduced to cannulate the right  internal jugular vein.  A 0.035 J-tip guidewire was then threaded  through the right internal jugular vein.  This would advanced down to  the level of the clavicle but would not advance further distally.  A 12  French dilator was placed over the guidewire into the proximal portion  of the vein.  Several attempts were made to advance the J wire and an  angled guidewire down into the innominate vein but these would not  advance easily.  At this point attempts were abandoned on  the right  side.  Ultrasound was then brought back up on the operative field and  the left neck was inspected.  The left internal jugular vein was normal  in appearance with normal compressibility and respiratory variation.  Local anesthesia was then infiltrated over the left internal jugular  vein.  An introducer needle was then used to cannulate the left internal  jugular vein using ultrasound guidance.  A 0.035 J-tip guidewire was  then threaded through the left internal jugular vein into the innominate  system and down into the inferior vena cava under fluoroscopic guidance.  Next, sequential 12, 14 and 16 French dilators with peel away sheaths  were placed over the guidewire using fluoroscopic guidance down into the  right atrium.  The dilator and guidewire was removed then a 23 cm Diatek  catheter was threaded through the peel away sheath down into the right  atrium.  The peel away sheath was then removed.  The catheter was then  tunneled subcutaneously, cut to length and the hub attached.  The  catheter was noted to flush and draw easily.  Catheter was  sutured to  the skin with nylon sutures.  The neck insertion site was closed with a  Vicryl stitch.  The catheter was  loaded with concentrated heparin solution.  The catheter was inspected  under fluoroscopy and found its tip to be in the right atrium with no  kinks throughout its course.  The patient was taken to the recovery room  in stable condition.  Instrument, sponge and needle count was correct at  the end of the case.      [name], MD  Electronically Signed     CEF/MEDQ  D:  03/25/2006  T:  03/25/2006  Job:  [id number]

## 2010-05-19 NOTE — Discharge Summary (Signed)
NAME:  [name], [name]               ACCOUNT NO.:  [id number]   MEDICAL RECORD NO.:  [id number]          PATIENT TYPE:  INP   LOCATION:  5501                         FACILITY:  [facility]   PHYSICIAN:  C. Milta Deiters, M.D.DATE OF BIRTH:  1934/03/15   DATE OF ADMISSION:  12/12/2004  DATE OF DISCHARGE:  12/15/2004                                 DISCHARGE SUMMARY   DISCHARGE DIAGNOSES:  1.  Chest pain.  2.  Shortness of breath.  3.  Respiratory abnormalities __________.  4.  Obesity.  5.  History of pericardial disease.  6.  Diabetes mellitus type 2, non-insulin dependent.   DISCHARGE MEDICATIONS:  1.  Prednisone 5 mg p.o. t.i.d.  2.  Ultram 50 mg p.o. b.i.d.  3.  Protonix 40 mg p.o. daily.  4.  Triamcinolone cream.  5.  Indomethacin 25 mg p.o. t.i.d.   CONDITION ON DISCHARGE:  Stable.  Will be followed up with [facility]iology and Ander Slade. Rockwell Alexandria, M.D.   PROCEDURE:  None.   CONSULTATIONS:  [facility].   ADMISSION HISTORY AND PHYSICAL:  This is a 76 year old lady who was recently  admitted by [facility] post two to three days of history of chest  pain described as pressure, indigestion which was later found to be  moderately large pericardial effusion with negative cardiac enzymes and  increased D-dimer.  This was recent admission in November 06, 2004.  There  was no discharge summary available. We do have the results of the  pericardial fluid analysis.  This time, patient presents with the positional  increase in chest pressure and shortness of breath over the last week. She  says that it was reminiscent of pericarditis she recently had.  She was  instructed to go to the emergency room which she did and she did have cold  last week associated with cough, other associated signs and symptoms which  were less intense at the time of presentation was swelling and rash which  was erythematous involving hands and chronic for the patient.   ALLERGIES:   NO KNOWN DRUG ALLERGIES.   PAST MEDICAL HISTORY:  1.  Pericardial effusion with subxiphoid pericardial window.  2.  Hysterectomy at the age of 89.  3.  Borderline hypertension.  4.  Cataract and caused vitreous detachment.  5.  Questionable rheumatologic disease with negative rheumatoid factor,      normal anion, ESR and CRP prior was 8.6.  6.  The last echo was done on October 31, 2004, prior to this admission,      which showed moderately increased AV thickness, RV hypertrophy, mild      regurgitation and mild PV regurgitation, moderate to large free flowing      echogenicity.   HOME MEDICATIONS:  1.  Prednisone 5 mg t.i.d.  2.  Ultram 50 mg b.i.d.  3.  Indomethacin 25 mg t.i.d. which was stopped secondary to decreased pain      one to two weeks prior to this hospitalization.  4.  Triamcinolone cream.  5.  Cetaphil.   She is  former smoker, she stopped smoking  45 years ago.  She smoked 45  years ago for two years.  No alcohol, no drugs.  She is married.  Works as a  Electrical engineer.  Lives with two sons.   FAMILY HISTORY:  Siblings:  Brother died at the age of 2 of heart attack.  Father also has history of heart attack.  Eight children.  One of them has  throat cancer.   REVIEW OF SYSTEMS:  Other than signs and symptoms already mentioned, she  also reports history of weight loss and fatigue and a cold last week.  Chest  pain is better with sitting forward.   PHYSICAL EXAMINATION ON ADMISSION:  VITAL SIGNS:  Temperature 98.7, blood  pressure 144/78, pulse 86, respirations 20, O2 saturation 96 on room air.  HEENT:  Her eyes were anicteric.  Extraocular muscles intact.  Oropharynx  clean.  Poor dentition with exposed upper molar.  NECK:  Supple with no JVD, no carotid bruits.  RESPIRATIONS:  Positive for kyphosis.  CARDIOVASCULAR:  Regular rate and rhythm, no murmurs.  Loud S1.  Patient  prefers to sit forward.  ABDOMEN:  No tenderness, no guarding, positive bowel sounds.   EXTREMITIES:  Hands, feet swelling palmar macular hyperpigmentation.  SKIN:  Warm and dry, hyperpigmented on palmar and plantar surfaces.  There  was no lymphadenopathy.  NEUROLOGIC:  Grossly intact.  Cranial nerves II-XII grossly intact.  Strength 5/5 upper and lower extremities bilaterally.  PSYCHOLOGIC:  Appropriate.   BMET on admission, sodium 139, potassium 3.7, chloride 106, CO2 28, BUN 8,  creatinine 0.2, glucose 118.  Troponin I two sets negative.  Myoglobin 46.3.  PTT 25, PT 14.7, INR 1.1.  Last TSH was done on October 30, 2004, and it was  3.662.  CBC with white blood cell count 8.2, hemoglobin 11.6, hematocrit  34.2, platelets 292, MCV 82.5.  AST 6.5, bilirubin 0.6, alkaline phosphatase  75, SGOT 34, SGPT 39, protein 6.5, albumin 3.1, calcium 8.2.  Previous  pericardial effusion studies done on November 01, 2004, pericardial fluid  glucose was 120 mg/dl in fluid while serum glucose was 104, total protein in  pericardial fluid was 4.4, uric acid was 4.2, amylase 44, LDH in fluid was  192, LDH in serum was 178.  There were few lymphocytes, 2 white blood cells.   Chest x-ray showed no acute cardiopulmonary disease.   EKG with normal sinus rhythm at 92 beats per minute.   ASSESSMENT/PLAN:  Admit the patient for the chest pain.  Differential  diagnosis included mild pericarditis versus viral verus auto-immune.  Also  angina versus acute myocardial infarction.  For viral studies, IgM and IgG  antibodies to Coxsackie, Adeno and  Echovirus were ordered as well as auto-  immune studies including ANA, rheumatoid factor, ESR, CRP, antiscleroderma  antibodies and anticentromere antibodies.  The cardiac enzymes were also  monitored for the normocytic anemia, FOBT and iron studies were ordered as  well as cardiology consult was placed.   HOSPITAL COURSE:  The chest pain status post pericarditis, cardiology was  consulted.  A 2-D echo was performed with the following results.   No significant pericardial effusion, left ventricular ejection fraction was 55  to 65%.  Aortic valve thickness mild increased, left atrium moderately  dilated.  Cardiology is going to follow Ms. Auburn outpatient and it was  suggested that the outpatient stress studies are to be done and [facility]iology will follow the patient one week after discharge.  For the  pericarditis,  viral, the Coxsackie and Echovirus IgM and IgG antibodies are  pending.  Adenovirus studies came back IgG level was 275 and IgM 0.63.  For  the possible autoimmune etiology the autoimmune factor screen negative,  antiscleroderma antibody was less than 0.2, centromere antibody was less  than 0.2, ANA was negative.  Rheumatoid factor was less than 20, CRP was  elevated at 18.1.  Pathology report of the blood was also done which showed  atypical lymphocytes, polychromasia and large platelets.  Cardiac enzymes  were negative throughout the course.   For her normocytic anemia, iron studies were done, which showed ferritin of  88, reticulocyte percent 1.4 with absolute reticulocyte 52.4 and FO PT  Hemoccult was also negative.  [name] was also called and per his  records, patient was prescribed prednisone for atypical polymyalgia and all  the rheumatoid studies in his office were negative and [name] will  follow the patient in outpatient after discharge.   DISCHARGE LABORATORY DATA:  Her CBC had white blood cell count was 7.4,  hemoglobin 10.6, hematocrit 32.4, MCV 87.4 (87.4 was also the MCV on  admission), discharge platelet count was 286.  BMET was sodium 139,  potassium 3.9, chloride 106, CO2 28, glucose 102, creatinine 1.7 and calcium  8.9.  BNP was 376.5.  Echovirus and Coxsackie studies are still pending.      Carolin Guernsey, MD    ______________________________  C. Milta Deiters, M.D.    DA/MEDQ  D:  12/19/2004  T:  12/21/2004  Job:  [id number]   cc:   [place].  Rockwell Alexandria, M.D.  Fax: [phone]

## 2010-05-19 NOTE — Discharge Summary (Signed)
[name], [name]               ACCOUNT NO.:  [id number]   MEDICAL RECORD NO.:  [id number]          PATIENT TYPE:  INP   LOCATION:  [id number]                         FACILITY:  [facility]   PHYSICIAN:  Louis Meckel, M.D.DATE OF BIRTH:  1934/09/05   DATE OF ADMISSION:  08/21/2005  DATE OF DISCHARGE:  08/24/2005                                 DISCHARGE SUMMARY   ADMISSION DIAGNOSES:  1. Epigastric pain.  2. Nausea, vomiting.  3. End-stage renal disease on chronic hemodialysis.  4. Uncontrolled hypertension.  5. History of gram-positive cocci bacteremia, July 2007, currently on      vancomycin with surveillance blood cultures negative.  6. History of erosive gastritis, duodenitis [name], April 2007.   DISCHARGE DIAGNOSIS:  1. Epigastric pain resolved, felt secondary to recurrent gastritis.  2. Nausea, vomiting, resolved with medication.  3. End-stage renal disease on chronic hemodialysis.  4. Uncontrolled hypertension.  5. History of erosive gastritis, [name], April 2007.  6. History of gram-positive cocci bacteremia diagnosed July 2007,      vancomycin through August 28, 2005, with surveillance blood cultures      negative.   BRIEF HISTORY:  A 76 year old black female with end-stage renal disease  secondary to hypertension, on chronic dialysis every Tuesday, Thursday,  Saturday at the [facility] but who initially started  dialysis in [place] earlier this month, presented to the emergency room with  epigastric pain and nausea.  She states her pain feels like indigestion.  She denies diaphoresis, radiation of pain, fever, chills, melena,  hematochezia.  The patient underwent an EGD in [place] approximately 3 weeks  ago.  It showed severe erosive gastritis as well as duodenitis.  She was  placed on Protonix b.i.d. and Reglan.  She states that she has been taking  these as indicated.  She also has a history of normal coronaries on  cardiac  catheterization January 2007 performed by Dr. Doreatha Lew.   LABS ON ADMISSION:  Hemoglobin 11.7, hematocrit 35.7.  sodium 135, potassium  5.4, creatinine 7.1, glucose 117, calcium 9.8, albumin 3.3, AST 59, ALT 19.  White count 7200.   HOSPITAL COURSE:  The patient was admitted, placed on clear liquid diet, IV  Reglan and IV Protonix in addition to her usual medications.  Serial cardiac  enzymes were negative for acute MI.  Lipase and amylase were essentially  normal, amylase being 141 and lipase 43.  Ultrasound of the abdomen showed  no gallstones and otherwise unremarkable except for echogenic, atrophied  kidneys consistent with end-stage renal disease.  Blood cultures done this  admission were negative.  She remained afebrile.  She was continued on  vancomycin.  She improved dramatically and her diet was advanced.  It is  felt that her epigastric pain and nausea were due to her severe erosive  gastritis as seen on EGD several weeks ago.  She is being discharged home to  continue Reglan 10 mg a.c. and h.s. and Protonix 40 mg b.i.d. indefinitely.  She is on tight heparin at the [facility], and her hemoglobin will be  followed weekly there Last hemoglobin at time of discharge is 9.2.   The patient's blood pressure is severely uncontrolled and in response to  this, her metoprolol was increased from 50 mg b.i.d. to 100 mg b.i.d..  Additionally lisinopril was started 5 mg q.h.s. as her fourth  antihypertensive medication.  Her dry weight was lowered 5 kg and will be 59  kg at the [facility].  This will be reevaluated for  further adjustments as needed.     Due to the patient's generalized weakness, home health physical therapy has  been arranged and she will have outpatient follow-up for this.   DISCHARGE MEDICATIONS:  1. Nephro-Vite vitamin one daily.  2. Reglan 10 mg a.c. and h.s.  3. Protonix 40 mg b.i.d.  4. Megace 14 mg daily.  5. Bidil 20/37.5 mg at  bedtime.  6. Norvasc 10 mg at bedtime.  7. Metoprolol increased to 100 mg b.i.d.  8. Catapres patch 0.2 mg every 24 hours, apply to skin weekly.  9. Zolpidem 10 mg daily.  10.Lisinopril 5 mg at bedtime.  11.Vancomycin 1 g IV each dialysis through August 28.  12.EPO 10,000 units IV each dialysis.   New dry weight 59 kg, to be reevaluated at the [place] [facility] for further  changes.  Dialysis Tuesday, Thursday, Saturday at [facility].      Nonah Mattes, P.A.    ______________________________  Louis Meckel, M.D.    RRK/MEDQ  D:  08/24/2005  T:  08/24/2005  Job:  [id number]   cc:   [place]

## 2010-05-19 NOTE — Discharge Summary (Signed)
[name], [name] NO.:  [id number]   MEDICAL RECORD NO.:  [id number]          PATIENT TYPE:  INP   LOCATION:  2007                         FACILITY:  [facility]   PHYSICIAN:  Adella Hare, M.D.      DATE OF BIRTH:  04-15-1934   DATE OF ADMISSION:  04/19/2005  DATE OF DISCHARGE:  04/21/2005                                 DISCHARGE SUMMARY   PRIMARY CARE PHYSICIAN:  Jay Schlichter, M.D.   DISCHARGE DIAGNOSES:  1.  Upper gastrointestinal bleed secondary to chronic gastritis secondary to      use of Goody's powders.  2.  Chronic fatigue, mostly likely secondary to underlying anemia from      above.   PAST MEDICAL HISTORY:  1.  Skin hyperpigmentation for the past 6-7 months.  2.  Questionable history of collagen vascular disease.  3.  History of pericardial effusion with a pericardial window placement in      November 2006.  4.  History of cardiac catheterization in January 2006.   DISCHARGE MEDICATIONS:  1.  Protonix 40 mg p.o. b.i.d.  2.  Iron sulfate 325 mg p.o. b.i.d.  3.  Multivitamin p.o. daily.   FOLLOWUP:  The patient will initially follow up with Dr. Jerilee Hoh in the  [facility] on Thursday, April 26, 2005, at 3 p.m.  A CBC  will be obtained to follow up on the hemoglobin.  Also, H.pylori serology  test is pending on the day of discharge which will be followed on hospital  follow up visit.  The patient will also follow with [name] on May 9, at  1 p.m. for scheduling of colonoscopy.  Finally, the patient will follow up  with Dr. [name], the patient's primary care [facility] in about four  weeks.  Please note, the patient had chronic fatigue and documented weight  loss for the past 6-7 months.  The first step would be to rule out colon  cancer by colonoscopy.  If the colonoscopy is normal and the patient  continues to have symptoms of fatigue and weight loss, then further workup  to evaluated for occult malignancy can be  undertaken by the PCP.  Also note,  the patient did have an HIV test a month ago which was nonreactive.   PROCEDURES:  Upper GI endoscopy was performed on April 20, 2005, by Dr.  Oletta Lamas, and it showed gastritis, most probably secondary to chronic NSAID  use.   ADMISSION HISTORY AND PHYSICAL:  [name] is a 76 year old white female  who presented to the clinic one day prior to admission for regular follow up  after she had pericardial effusion in November, and with history of 6-7  months of progressive fatigue and 20-25 pound weight loss.  As part of  routine check, a CBC was checked which showed a hemoglobin of 7.6 which was  down from a hemoglobin of 11 in March with potassium of 7, and thus, she was  admitted for further evaluation of her anemia.   PHYSICAL EXAMINATION:  VITAL SIGNS:  Temperature 99.7, blood pressure  150/70, pulse 100, positive for orthostatic's, and blood pressure dropped to  130/90 when standing.  GENERAL APPEARANCE:  The patient is a tired appearing woman in no acute  distress.  CARDIOVASCULAR:  Tachycardic with regular rate and rhythm.  CHEST:  Clear to auscultation bilaterally.  ABDOMEN:  Soft, nontender, nondistended, positive bowel sounds.  She did  have positive occult blood tests.  SKIN:  She had hyperpigmentation of palms, skin, arms and back.   LABORATORIES ON ADMISSION:  Sodium 137, potassium 4.1, chloride 104,  bicarbonate 27, BUN 13, creatinine 0.8, glucose 18.  Hemoglobin 7.6, MCV  85.3, RDW 17.1, white count 7.1, AMC 4.6, platelets 451,000, albumin 3.1,  LDH 191, B12 733, ferritin 733, haptoglobin 176.   HOSPITAL COURSE:  #1 - BLOOD LOSS ANEMIA SECONDARY TO CHRONIC NSAID USE.  The patient gave history of Goody's powder use for the past 2-3 months for  her chronic pain syndrome.  Hemoglobin had dropped 11 to 7.6 from March to  April, ferritin was also low at 22, and she was guaiac positive, most likely  the cause of an upper GI bleed.   Gastroenterology consult was obtained, and  [name] performed an upper GI endoscopy on April 20, 2005, which showed  gastritis, most likely secondary to chronic NSAID use.  Thus, she is being  discharged on Protonix and iron sulfate with instructions on not to use any  NSAID's in the future, mainly Alleve, Motrin, ibuprofen, Naprosyn or aspirin  for now.  Also, H.pylori serology was obtained, and that is pending on the  day of discharge and is to be followed up on the hospital follow up visit.   #2 - CHRONIC FATIGUE WITH A 20-25 POUND WEIGHT LOSS.  Again, as mentioned  above, this could be secondary to her blood loss anemia since she had marked  improvement in her fatigue after receiving two units of PRBC's.  If this  were to persist, the next would be to do a colonoscopy which she has been  scheduled for on May 01, 2005, to evaluated for colon cancer.  If colonoscopy  is normal and the patient continues to have fatigue and weight loss, further  workup can be done to evaluate for occult malignancy by doing CT scan's of  the chest and abdomen.  Also note, HIV test performed one month ago was  negative, ruling out HIV as the cause of her fatigue.   DISCHARGE LABORATORIES:  Hemoglobin 9.8, white count 9.8, platelets 401,000.      Adella Hare, M.D.  Electronically Signed     BP/MEDQ  D:  04/21/2005  T:  04/23/2005  Job:  [id number]   cc:   Jay Schlichter, M.D.  Fax: [phone]   Joyice Faster. Rolla Flatten., M.D.  Fax: [phone]

## 2010-05-19 NOTE — Op Note (Signed)
NAME:  [name], [name]               ACCOUNT NO.:  [id number]   MEDICAL RECORD NO.:  [id number]          PATIENT TYPE:  AMB   LOCATION:  SDS                          FACILITY:  [facility]   PHYSICIAN:  Charles E. Fields, MD  DATE OF BIRTH:  Mar 02, 1934   DATE OF PROCEDURE:  11/19/2005  DATE OF DISCHARGE:  11/19/2005                               OPERATIVE REPORT   PROCEDURE:  Insertion of right internal jugular vein Diatek catheter.   PREOPERATIVE DIAGNOSIS:  End-stage renal disease with poorly function  catheter.   POSTOPERATIVE DIAGNOSIS:  End-stage renal disease with poorly function  catheter.   ANESTHESIA:  Local with IV sedation.   OPERATIVE DETAIL:  Obtaining informed consent, the patient taken the  operating room.  The patient placed supine position on operating table.  The patient's entire neck and chest were prepped and draped usual  sterile fashion.  Local anesthesia was infiltrated over preexisting  catheter in the right side of neck.  Incision was made over the catheter  near the insertion site in the neck.  After infiltration of local  anesthesia.  Incision was carried down through subcutaneous tissues.  Catheter was dissected free circumferentially and elevated up in the  operative field.  Catheter was clamped proximally and distally with  hemostats.  0.035 J-tip guidewire was threaded through the right  internal jugular vein catheter and down into the right atrium.  The  distal portion of the catheter was then removed and passed off the  table.  The proximal portion of the catheter was then removed with  gentle traction and also passed off the table.  Next sequential 12, 14,  16-French dilators with peel-away sheath placed over the guidewire in  the right atrium.  A 23-cm Diatek catheter was then brought up in the  operative field placed through the peel-away sheath down into the right  atrium.  The peel-away sheath, guidewire and dilator were then removed.  Next catheter  was tunneled subcutaneously, cut to length and hub  attached.  Catheter was noted to flush and draw easily.  Catheters noted  to have its  tip in the right atrium.  There were no kinks throughout  its course.  Catheter was sutured to skin with nylon sutures.  Neck  insertion site was closed with nylon stitch.  Catheter was then loaded  with concentrated heparin solution.  The patient tolerate procedure well  and there were no complications.  Instrument, sponge and needle counts  correct at end the case.  The patient taken to recovery room in stable  condition.      [name], MD  Electronically Signed     CEF/MEDQ  D:  11/27/2005  T:  11/27/2005  Job:  [id number]

## 2010-05-19 NOTE — Cardiovascular Report (Signed)
NAME:  [name], [name]               ACCOUNT NO.:  [id number]   MEDICAL RECORD NO.:  [id number]          PATIENT TYPE:  OIB   LOCATION:  1962                         FACILITY:  [facility]   PHYSICIAN:  Ludwig Lean. Doreatha Lew, M.D.DATE OF BIRTH:  1934-08-05   DATE OF PROCEDURE:  01/10/2005  DATE OF DISCHARGE:                              CARDIAC CATHETERIZATION   HISTORY:  [name] has had a rash and arthralgias. She has been seen by  [name] in the past and rheumatologic studies have been performed. She  had a pericardial effusion and subsequently had pericardial stripping. She  presents with recurrent chest pains and dyspnea. Follow-up stress Cardiolite  study showed equivocal anterior defect.   She is referred now for cardiac catheterization. Right and left heart  catheterization is performed.   PROCEDURE:  Right and left heart catheterization with selective coronary  angiography and left ventricular angiography.   TYPE AND SITE OF ENTRY:  Percutaneous right femoral artery, percutaneous  right femoral vein.   CATHETERS:  A 5-French 4-CURVED Judkins right and left coronary catheters,  5-French pigtail ventriculographic catheter, 7-French Swan-Ganz  thermodilution catheter.   CONTRAST MATERIAL:  Omnipaque.   MEDICATIONS PRIOR TO PROCEDURE:  Valium 10 mg p.o.   MEDICATIONS GIVEN DURING THE PROCEDURE:  Versed 2 mg  IV.   COMMENT:  The patient tolerated the procedure well.   HEMODYNAMIC DATA:  Right atrial pressure A-wave of 8, V-wave of 6, mean of  3. RV pressure is 28/1-4.  Pulmonary artery pressure was 26/10-18. Pulmonary  capillary wedge pressure showed A-wave of 10, V-wave of 8, mean of 6.  Aortic pressure of 139/78, LV was 139/7-12.   The cardiac output was 4.8 with cardiac index of 2.68.  Thermodilution  cardiac output is 5.0 with cardiac index of 2.79.   Pulmonary artery saturation was 70 with aortic saturation of 93.   ANGIOGRAPHIC DATA:  1.  The left main coronary  artery is normal.  2.  The left anterior descending is long vessel that wraps around the apex.      It is a high proximal diagonal vessel. It is normal.  3.  Left circumflex:  The left circumflex is a very large system that goes      to the inferior wall.  It is normal.  4.  Right coronary artery:  The right coronary artery is a small dominant      vessel. It is a high conus branch. It is normal.   The left ventricular angiogram was performed in the RAO position. Overall  cardiac size and silhouette are normal with a hyperdynamic left ventricle.  Global ejection fraction would be 70%. There was no mitral regurgitation or  intracavitary filling defect or intracardiac calcification noted.   OVERALL IMPRESSION:  1.  Normal but hyperdynamic left ventricle.  2.  Normal right heart pressures with normal cardiac outputs and normal left      heart pressures.  3.  Normal coronary arteries.      Ludwig Lean. Doreatha Lew, M.D.  Electronically Signed     SNT/MEDQ  D:  01/10/2005  T:  01/10/2005  Job:  [id number]   cc:   Ander Slade. Rockwell Alexandria, M.D.  Fax: [place]

## 2010-05-19 NOTE — Op Note (Signed)
NAME:  [name], [name]               ACCOUNT NO.:  [id number]   MEDICAL RECORD NO.:  [id number]          PATIENT TYPE:  AMB   LOCATION:  SDS                          FACILITY:  [facility]   PHYSICIAN:  Nelda Severe. Kellie Simmering, M.D.  DATE OF BIRTH:  February 26, 1934   DATE OF PROCEDURE:  03/06/2006  DATE OF DISCHARGE:                               OPERATIVE REPORT   PREOPERATIVE DIAGNOSIS:  Thrombosed arteriovenous Gore-Tex graft, left  arm.   POSTOPERATIVE DIAGNOSIS:  Thrombosed arteriovenous Gore-Tex graft, left  arm.   OPERATION/PROCEDURE:  Simple thrombectomy arteriovenous Gore-Tex graft,  left arm, with exploration of venous end and intraoperative shuntogram.   SURGEON:  Nelda Severe. Kellie Simmering, M.D.   FIRST ASSISTANT:  Sheral Apley. Burney Gauze, M.D.   ANESTHESIA:  Local.   DESCRIPTION OF PROCEDURE:  The patient was taken to the operating room,  placed in supine position at which time the left upper extremity was  prepped Betadine scrub and solution and draped in a routine sterile  manner.  After infiltration of 1%  Xylocaine, a longitudinal incision  was made the distal upper arm where the Gore-Tex had been previously  revised under the basilic vein.  This was dissected free proximally and  distally and the vein was an excellent vein, being at least 5 mm in  size.  She was given 3000 units of heparin intravenously.  Transverse  opening made in the graft just proximal to the venous anastomosis.  Venous anastomosis was easily thrombectomized with the #5 Fogarty  catheter and the Fogarty traversed this into the chest without  difficulty.  The graft itself was also easily thrombectomized with  excellent inflow being reestablished and no areas of stenosis in the  graft.  There was excellent inflow present.  A #5 dilator would easily  traverse the venous anastomosis.  Graft was reclosed with continuous 6-0  Prolene suture.  Clamp released.  There was an excellent pulse and  thrill in the graft.  Intraoperative  shuntogram was performed to look at  the graft itself which was widely patent with no areas of stenosis.  Following this the wound was closed with Vicryl in the subcuticular  fashion.  Sterile dressing applied.  The patient taken to recovery in  satisfactory condition.           ______________________________  Nelda Severe Kellie Simmering, M.D.     JDL/MEDQ  D:  03/06/2006  T:  03/06/2006  Job:  [id number]

## 2010-07-13 ENCOUNTER — Encounter: Payer: Self-pay | Admitting: Internal Medicine

## 2010-08-07 ENCOUNTER — Other Ambulatory Visit: Payer: Self-pay | Admitting: Internal Medicine

## 2010-09-20 ENCOUNTER — Encounter: Payer: Self-pay | Admitting: Internal Medicine

## 2010-09-20 ENCOUNTER — Telehealth: Payer: Self-pay | Admitting: *Deleted

## 2010-09-20 ENCOUNTER — Ambulatory Visit (INDEPENDENT_AMBULATORY_CARE_PROVIDER_SITE_OTHER): Payer: PRIVATE HEALTH INSURANCE | Admitting: Internal Medicine

## 2010-09-20 ENCOUNTER — Ambulatory Visit: Payer: PRIVATE HEALTH INSURANCE | Admitting: Internal Medicine

## 2010-09-20 DIAGNOSIS — I10 Essential (primary) hypertension: Secondary | ICD-10-CM

## 2010-09-20 DIAGNOSIS — R42 Dizziness and giddiness: Secondary | ICD-10-CM

## 2010-09-20 LAB — CBC WITH DIFFERENTIAL/PLATELET
Basophils Absolute: 0 10*3/uL (ref 0.0–0.1)
Basophils Relative: 0 % (ref 0–1)
Eosinophils Absolute: 0.2 10*3/uL (ref 0.0–0.7)
Eosinophils Relative: 4 % (ref 0–5)
HCT: 39.8 % (ref 36.0–46.0)
Hemoglobin: 12.7 g/dL (ref 12.0–15.0)
Lymphocytes Relative: 28 % (ref 12–46)
Lymphs Abs: 1.3 10*3/uL (ref 0.7–4.0)
MCH: 29.7 pg (ref 26.0–34.0)
MCHC: 31.9 g/dL (ref 30.0–36.0)
MCV: 93 fL (ref 78.0–100.0)
Monocytes Absolute: 0.4 10*3/uL (ref 0.1–1.0)
Monocytes Relative: 9 % (ref 3–12)
Neutro Abs: 2.8 10*3/uL (ref 1.7–7.7)
Neutrophils Relative %: 60 % (ref 43–77)
Platelets: 251 10*3/uL (ref 150–400)
RBC: 4.28 MIL/uL (ref 3.87–5.11)
RDW: 14.9 % (ref 11.5–15.5)
WBC: 4.7 10*3/uL (ref 4.0–10.5)

## 2010-09-20 LAB — MAGNESIUM: Magnesium: 2.3 mg/dL (ref 1.5–2.5)

## 2010-09-20 MED ORDER — MECLIZINE HCL 25 MG PO TABS: 25.0000 mg | ORAL_TABLET | Freq: Three times a day (TID) | ORAL | Status: AC | PRN

## 2010-09-20 MED ORDER — AMLODIPINE BESYLATE 10 MG PO TABS: 10.0000 mg | ORAL_TABLET | Freq: Every day | ORAL | Status: DC

## 2010-09-20 NOTE — Assessment & Plan Note (Signed)
Blood pressure is slightly above goal. I will not give any new medication today considering dizziness spells. Reassess next time.

## 2010-09-20 NOTE — Assessment & Plan Note (Addendum)
As per history of present illness and physical exam, patient seems to have vertigo.  She does not seem to have any labyrinthitis or any significant brain pathology. This seems to be positional vertigo along with some autonomic dysfunction. She is on Lasix 80 mg 2 times a day and so might have some electrolyte abnormalities- I will check CMP. She's been followed by any the clinic and her last hemoglobin was 11.5 in April 2012-will check CBC today. She has history of pericardial effusion in 2007 but today on exam there are no muffled heart sounds are any murmurs. Her blood pressure not low. She might need echocardiogram if she continues to have dizziness. Also side effect of the medication is possible. After discussing with Dr. Nance Pew, who also talked with the patient, I decided to give her meclizine 25 mg 3  times a day for symptomatic management. Advised her to use cane or walker when she walks. Also wait for 2-3 minutes before she gets up from the bed. Also advised to check her blood pressure every time before she takes medications and if her blood pressure is less than 100/70 hold Norvasc and Lasix. She was a complaint by her granddaughter. They both verbalized understanding to the plan. I will see her back in a week and explain her to call back to make appointment or go to ER if something happens meanwhile.

## 2010-09-20 NOTE — Patient Instructions (Signed)
Please make a follow up appointment in 7-10 days. Please check your Blood Pressure every day. If your BP is less than 100/70, hold Amlodipine and Lasix for that day. Start Meclizine 25 mg three times a day  As needed for dizziness/Vertigo. Keep on Drinking water. I will call you if the lab test if they are abnormal.

## 2010-09-20 NOTE — Telephone Encounter (Signed)
Agree with appt Thanks 

## 2010-09-20 NOTE — Telephone Encounter (Signed)
Call from pt c/o dizziness mand light headedness  x 3 days.  Pt said that she is not having fevers.  Feels bad.  Michela Pitcher that she has felt this way before when she was on Dialysis and her blood pressures were low.  Pt given an appointment to come in to be assessed for her dizziness.

## 2010-09-20 NOTE — Progress Notes (Signed)
  Subjective:    Patient ID: [name], female    DOB: 10-May-1934, 76 y.o.   MRN: [id number]  HPI Ms. Sturm is a pleasant 76 year lady with a past with a history of chronically disease, hypertension, anemia who comes the clinic with dizziness for 2 days. She is pretty active and independent. She complains of dizziness as lightheadedness when she gets up and walks around. She describes it as a sensation of head spinning around and not the room. She denies having any nausea, blackout episodes, warmth passing through the body, significant headache, vision changes, palpitations, tinnitus along with the dizziness episode. She drinks enough water every day to keep her hydrated and her orthostatic vital signs are normal. She denies any fever, chills, recent weight loss, vomiting, diarrhea, abdominal pain, chest pain, short of breath, cough. She was on hemodialysis for about 3 years between 2007 and 2010 and is off dialysis now. She is followed by Dr. Moshe Cipro with [facility]-which she saw about 3 months before. She is also followed by the anemia clinic every month. She denies any blood in stool or change in color the stool. Of note-she has chronic staggering gait since around 2007 and uses cane at home. Although has not been evaluated by a specialist   Review of Systems    as per history of present illness, all other systems reviewed and negative Objective:   Physical Exam Constitutional: Vital signs reviewed.  Patient is a well-developed and well-nourished in no acute distress and cooperative with exam. Alert and oriented x3.  Head: Normocephalic and atraumatic Mouth: no erythema or exudates, MMM Eyes: PERRL, EOMI, conjunctivae normal, No scleral icterus.  Neck: Supple, Trachea midline normal ROM, No JVD Cardiovascular: RRR, S1 normal, S2 normal, no MRG, pulses symmetric and intact bilaterally Pulmonary/Chest: CTAB, no wheezes, rales, or rhonchi Abdominal: Soft.  Non-tender, non-distended, bowel sounds are normal, no masses, organomegaly, or guarding present.  Musculoskeletal: No joint deformities, erythema, or stiffness, ROM full and no nontender Neurological: A&O x3, Strenght is normal and symmetric bilaterally, cranial nerve II-XII are grossly intact, no focal motor deficit, sensory intact to light touch bilaterally. Mild  staggering gait, Romberg sign positive. Skin: Warm, dry and intact. No rash, cyanosis, or clubbing.  Psychiatric: Normal mood and affect. speech and behavior is normal. Judgment and thought content normal. Cognition and memory are normal.          Assessment & Plan:

## 2010-09-21 LAB — COMPLETE METABOLIC PANEL WITH GFR
ALT: 12 U/L (ref 0–35)
AST: 16 U/L (ref 0–37)
Albumin: 4.4 g/dL (ref 3.5–5.2)
Alkaline Phosphatase: 132 U/L — ABNORMAL HIGH (ref 39–117)
BUN: 43 mg/dL — ABNORMAL HIGH (ref 6–23)
CO2: 25 mEq/L (ref 19–32)
Calcium: 9.5 mg/dL (ref 8.4–10.5)
Chloride: 103 mEq/L (ref 96–112)
Creat: 2.64 mg/dL — ABNORMAL HIGH (ref 0.50–1.10)
GFR, Est African American: 21 mL/min — ABNORMAL LOW (ref >60)
GFR, Est Non African American: 18 mL/min — ABNORMAL LOW (ref >60)
Glucose, Bld: 87 mg/dL (ref 70–99)
Potassium: 4.3 mEq/L (ref 3.5–5.3)
Sodium: 141 mEq/L (ref 135–145)
Total Bilirubin: 0.5 mg/dL (ref 0.3–1.2)
Total Protein: 7.6 g/dL (ref 6.0–8.3)

## 2010-09-22 LAB — POCT I-STAT 4, (NA,K, GLUC, HGB,HCT)
Glucose, Bld: 91
HCT: 47 — ABNORMAL HIGH
Hemoglobin: 16 — ABNORMAL HIGH
Operator id: 206361
Potassium: 3.8
Sodium: 136

## 2010-09-27 ENCOUNTER — Encounter: Payer: Self-pay | Admitting: Internal Medicine

## 2010-09-27 ENCOUNTER — Ambulatory Visit (INDEPENDENT_AMBULATORY_CARE_PROVIDER_SITE_OTHER): Payer: PRIVATE HEALTH INSURANCE | Admitting: Internal Medicine

## 2010-09-27 DIAGNOSIS — I10 Essential (primary) hypertension: Secondary | ICD-10-CM

## 2010-09-27 DIAGNOSIS — N19 Unspecified kidney failure: Secondary | ICD-10-CM

## 2010-09-27 DIAGNOSIS — R42 Dizziness and giddiness: Secondary | ICD-10-CM

## 2010-09-27 NOTE — Patient Instructions (Signed)
Please make an appointment in 5-6 months. Early appointment if needed. Please keep on taking all medications regularly and follow up with Dr. Moshe Cipro.

## 2010-09-27 NOTE — Assessment & Plan Note (Signed)
She has an appointment with Dr. Moshe Cipro in 2 weeks.

## 2010-09-27 NOTE — Assessment & Plan Note (Signed)
No more episodes of dizziness after starting meclizine. She feels much better. Lab tests showed no electrolyte abnormalities and also hemoglobin stable. Advised her to call the clinic or go to ER if she has anymore episodes.

## 2010-09-27 NOTE — Assessment & Plan Note (Addendum)
Blood pressure improved the last visit. Not at goal yet. Reassessed during next visit. Continue same medications for now.  She denies taking any vaccination as she had a severe reaction to one vaccination past.

## 2010-09-27 NOTE — Progress Notes (Signed)
  Subjective:    Patient ID: [name], female    DOB: 07-Feb-1934, 76 y.o.   MRN: [id number]  HPI [name] is a pleasant 75 year old lady with past medical history of hypertension, renal failure, anemia who comes to the clinic for followup visit for her vertigo. I saw her on 09/20/2010 at that time she was complaining of dizziness and was diagnosed with vertigo. She was prescribed meclizine and lab tests to check her electrolytes and hemoglobin which were normal. She has chronic renal insufficiency and creatinine was 2.64 which is baseline. Today she feels much better and has no dizziness present the starting meclizine. She denies any fever, chills, nausea vomiting, dizziness, falls or abdominal pain, diarrhea or chest pain, short of breath.    Review of Systems    as per history of present illness, all other systems reviewed and negative. Objective:   Physical Exam  Vitals: Reviewed and stable. General: NAD HEENT: PERRL, EOMI, no scleral icterus Cardiac: RRR, no rubs, murmurs or gallops Pulm: clear to auscultation bilaterally, moving normal volumes of air Abd: soft, nontender, nondistended, BS present Ext: warm and well perfused, no pedal edema Neuro: alert and oriented X3, cranial nerves II-XII grossly intact, strength and sensation to light touch equal in bilateral upper and lower extremities       Assessment & Plan:

## 2010-10-13 LAB — DIFFERENTIAL
Basophils Absolute: 0
Basophils Relative: 0
Eosinophils Absolute: 0.2
Eosinophils Relative: 2
Lymphocytes Relative: 15
Lymphs Abs: 1.5
Monocytes Absolute: 0.8 — ABNORMAL HIGH
Monocytes Relative: 8
Neutro Abs: 7.5
Neutrophils Relative %: 75

## 2010-10-13 LAB — CBC
HCT: 34.8 — ABNORMAL LOW
Hemoglobin: 11.5 — ABNORMAL LOW
MCHC: 33
MCV: 95.1
Platelets: 240
RBC: 3.65 — ABNORMAL LOW
RDW: 17.5 — ABNORMAL HIGH
WBC: 10.1

## 2010-10-13 LAB — CK TOTAL AND CKMB (NOT AT ARMC)
CK, MB: 1.2
Relative Index: INVALID
Total CK: 76

## 2010-10-13 LAB — POCT CARDIAC MARKERS
CKMB, poc: 1.6
CKMB, poc: <1 — ABNORMAL LOW
Myoglobin, poc: 190
Myoglobin, poc: 235
Operator id: 146091
Operator id: 146091
Troponin i, poc: <0.05
Troponin i, poc: <0.05

## 2010-10-13 LAB — POCT I-STAT CREATININE
Creatinine, Ser: 2.7 — ABNORMAL HIGH
Operator id: 146091

## 2010-10-13 LAB — I-STAT 8, (EC8 V) (CONVERTED LAB)
Acid-Base Excess: 5 — ABNORMAL HIGH
BUN: 20
Bicarbonate: 29.4 — ABNORMAL HIGH
Chloride: 104
Glucose, Bld: 102 — ABNORMAL HIGH
HCT: 41
Hemoglobin: 13.9
Operator id: 146091
Potassium: 3.7
Sodium: 139
TCO2: 31
pCO2, Ven: 40.4 — ABNORMAL LOW
pH, Ven: 7.47 — ABNORMAL HIGH

## 2010-10-13 LAB — TROPONIN I: Troponin I: 0.03

## 2010-10-24 ENCOUNTER — Other Ambulatory Visit: Payer: Self-pay | Admitting: *Deleted

## 2010-10-24 MED ORDER — OMEPRAZOLE 20 MG PO CPDR: 20.0000 mg | DELAYED_RELEASE_CAPSULE | Freq: Two times a day (BID) | ORAL | Status: DC

## 2011-01-17 ENCOUNTER — Other Ambulatory Visit: Payer: Self-pay | Admitting: Internal Medicine

## 2011-01-17 NOTE — Telephone Encounter (Signed)
Refill approved; please schedule a follow up appointment in my clinic.

## 2011-03-07 ENCOUNTER — Encounter: Payer: Self-pay | Admitting: Internal Medicine

## 2011-03-07 ENCOUNTER — Ambulatory Visit (INDEPENDENT_AMBULATORY_CARE_PROVIDER_SITE_OTHER): Payer: PRIVATE HEALTH INSURANCE | Admitting: Internal Medicine

## 2011-03-07 VITALS — BP 137/81 | HR 88 | Temp 98.1°F | Ht 62.0 in | Wt 176.4 lb

## 2011-03-07 DIAGNOSIS — N19 Unspecified kidney failure: Secondary | ICD-10-CM

## 2011-03-07 DIAGNOSIS — F329 Major depressive disorder, single episode, unspecified: Secondary | ICD-10-CM

## 2011-03-07 DIAGNOSIS — I319 Disease of pericardium, unspecified: Secondary | ICD-10-CM

## 2011-03-07 DIAGNOSIS — Z1231 Encounter for screening mammogram for malignant neoplasm of breast: Secondary | ICD-10-CM

## 2011-03-07 DIAGNOSIS — I10 Essential (primary) hypertension: Secondary | ICD-10-CM

## 2011-03-07 LAB — COMPLETE METABOLIC PANEL WITH GFR
ALT: 10 U/L (ref 0–35)
AST: 16 U/L (ref 0–37)
Albumin: 4.4 g/dL (ref 3.5–5.2)
Alkaline Phosphatase: 141 U/L — ABNORMAL HIGH (ref 39–117)
BUN: 33 mg/dL — ABNORMAL HIGH (ref 6–23)
CO2: 28 mEq/L (ref 19–32)
Calcium: 9.6 mg/dL (ref 8.4–10.5)
Chloride: 101 mEq/L (ref 96–112)
Creat: 2.21 mg/dL — ABNORMAL HIGH (ref 0.50–1.10)
GFR, Est African American: 25 mL/min — ABNORMAL LOW
GFR, Est Non African American: 21 mL/min — ABNORMAL LOW
Glucose, Bld: 76 mg/dL (ref 70–99)
Potassium: 4 mEq/L (ref 3.5–5.3)
Sodium: 142 mEq/L (ref 135–145)
Total Bilirubin: 0.5 mg/dL (ref 0.3–1.2)
Total Protein: 7.3 g/dL (ref 6.0–8.3)

## 2011-03-07 LAB — LIPID PANEL
Cholesterol: 198 mg/dL (ref 0–200)
HDL: 70 mg/dL (ref >39)
LDL Cholesterol: 103 mg/dL — ABNORMAL HIGH (ref 0–99)
Total CHOL/HDL Ratio: 2.8 Ratio
Triglycerides: 124 mg/dL (ref <150)
VLDL: 25 mg/dL (ref 0–40)

## 2011-03-07 LAB — CBC WITH DIFFERENTIAL/PLATELET
Basophils Absolute: 0 10*3/uL (ref 0.0–0.1)
Basophils Relative: 0 % (ref 0–1)
Eosinophils Absolute: 0.2 10*3/uL (ref 0.0–0.7)
Eosinophils Relative: 4 % (ref 0–5)
HCT: 39.1 % (ref 36.0–46.0)
Hemoglobin: 12.5 g/dL (ref 12.0–15.0)
Lymphocytes Relative: 23 % (ref 12–46)
Lymphs Abs: 1.1 10*3/uL (ref 0.7–4.0)
MCH: 30.1 pg (ref 26.0–34.0)
MCHC: 32 g/dL (ref 30.0–36.0)
MCV: 94.2 fL (ref 78.0–100.0)
Monocytes Absolute: 0.5 10*3/uL (ref 0.1–1.0)
Monocytes Relative: 11 % (ref 3–12)
Neutro Abs: 3 10*3/uL (ref 1.7–7.7)
Neutrophils Relative %: 62 % (ref 43–77)
Platelets: 227 10*3/uL (ref 150–400)
RBC: 4.15 MIL/uL (ref 3.87–5.11)
RDW: 13.7 % (ref 11.5–15.5)
WBC: 4.8 10*3/uL (ref 4.0–10.5)

## 2011-03-07 MED ORDER — FLUOXETINE HCL 10 MG PO TABS: 10.0000 mg | ORAL_TABLET | Freq: Every day | ORAL | Status: DC

## 2011-03-07 NOTE — Patient Instructions (Signed)
Stop bupropion (Wellbutrin). Start fluoxetine (Prozac) 10 mg daily for depression. Please schedule an appointment at [facility] for evaluation of depression - contact information provided. Please call and return immediately if you have any worsening of your symptoms, or any new problems.

## 2011-03-07 NOTE — Assessment & Plan Note (Addendum)
Assessment: Patient has symptoms of worsening depression; she is currently taking bupropion XL 150 mg daily.  She denies any suicidal thoughts.  Plan: Because of patient's renal insufficiency, it would not be appropriate to increase her bupropion dose.  The plan is to start fluoxetine 10 mg daily, which should not require dosing adjustment in the setting of renal insufficiency.  Because of potential interaction of fluoxetine and the bupropion, will stop bupropion.  I also advised patient that she should schedule an appointment at [facility] for mental health services and in order to see a psychiatrist for management of her worsening depression.  She would like to see how she does with the switch to fluoxetine before going there; I advised that this is reasonable as long as she is stable or improving, but that with any worsening she should let me know immediately and should also go to [facility] immediately.  Information about [facility] was provided in clinic today.  I will also check a TSH level.

## 2011-03-07 NOTE — Assessment & Plan Note (Signed)
Lab Results  Component Value Date   CREATININE 2.64* 09/20/2010   CREATININE 2.64* 04/06/2010   CREATININE 2.60 11/15/2009   CREATININE 2.32* 08/30/2009   CREATININE 2.7* 08/20/2006     Assessment: Patient has stable chronic renal failure that previously required hemodialysis; she has continued to do well off of hemodialysis.  She follows up regularly with her nephrologist Dr. Clover Mealy.   Plan: Will check a metabolic panel today, and forwarded results of visit to her nephrologist Dr. Clover Mealy.

## 2011-03-07 NOTE — Assessment & Plan Note (Signed)
Lab Results  Component Value Date   NA 141 09/20/2010   K 4.3 09/20/2010   CL 103 09/20/2010   CO2 25 09/20/2010   BUN 43* 09/20/2010   CREATININE 2.64* 09/20/2010    BP Readings from Last 3 Encounters:  03/07/11 137/81  09/27/10 139/89  09/20/10 146/96    Assessment: Hypertension control:  Near goal  Progress toward goals:  improved Barriers to meeting goals:  no barriers identified  Plan: Hypertension treatment:  continue current medications

## 2011-03-07 NOTE — Progress Notes (Signed)
  Subjective:    Patient ID: [name], female    DOB: [birth year], 77 y.o.   MRN: [id number]  HPI Patient returns for followup of her depression, hypertension, chronic renal failure, and other medical problems.  Today her main complaint is worsening depression over the past couple of months, despite taking bupropion 150 mg daily.  I repeated her PHQ-9 depression scale score today and obtained a score of 18; see documentation flow sheet).  Patient also reports intermittent pain in her right knee and left shoulder; these pains are transient and they are not accompanied by other symptoms including swelling, warmth, or tenderness.  She follows up regularly with her nephrologist Dr. Clover Mealy.  She reports that she is compliant with her medications.   Review of Systems  Constitutional: Positive for appetite change. Negative for fever and chills.  Respiratory: Negative for cough and shortness of breath.   Cardiovascular: Negative for chest pain and leg swelling.  Gastrointestinal: Negative for nausea, vomiting and abdominal pain.  Genitourinary: Negative for dysuria and difficulty urinating.  Musculoskeletal: Positive for arthralgias (Occasional brief episodes of right knee and left shoulder pain).  Psychiatric/Behavioral: Positive for sleep disturbance (Difficulty going to sleep and early awakening) and dysphoric mood. Negative for suicidal ideas and self-injury. The patient is not nervous/anxious.        Objective:   Physical Exam  Constitutional: No distress.  Cardiovascular: Regular rhythm and normal heart sounds.  Exam reveals no gallop and no friction rub.   No murmur heard. Pulmonary/Chest: Effort normal and breath sounds normal. No respiratory distress. She has no wheezes. She has no rales.  Abdominal: Soft. Bowel sounds are normal. She exhibits no distension. There is no hepatosplenomegaly. There is no tenderness. There is no rebound and no guarding.  Musculoskeletal: She exhibits no  edema.       Right knee: She exhibits normal range of motion, no swelling, no effusion, no deformity, no erythema, normal alignment, no LCL laxity and no MCL laxity. no tenderness found.  Psychiatric: Her speech is normal and behavior is normal. Thought content normal. Her mood appears not anxious. Her affect is not inappropriate. She exhibits a depressed mood.        Assessment & Plan:

## 2011-03-08 LAB — T4, FREE: Free T4: 1.16 ng/dL (ref 0.80–1.80)

## 2011-03-08 LAB — TSH: TSH: 2.314 u[IU]/mL (ref 0.350–4.500)

## 2011-03-12 ENCOUNTER — Encounter (HOSPITAL_COMMUNITY): Payer: Self-pay | Admitting: *Deleted

## 2011-03-12 ENCOUNTER — Emergency Department (HOSPITAL_COMMUNITY): Payer: PRIVATE HEALTH INSURANCE

## 2011-03-12 ENCOUNTER — Emergency Department (HOSPITAL_COMMUNITY)
Admission: EM | Admit: 2011-03-12 | Discharge: 2011-03-12 | Disposition: A | Discharge status: Home / Self Care | Payer: PRIVATE HEALTH INSURANCE | Attending: Emergency Medicine | Admitting: Emergency Medicine

## 2011-03-12 ENCOUNTER — Other Ambulatory Visit: Payer: Self-pay

## 2011-03-12 DIAGNOSIS — R5383 Other fatigue: Secondary | ICD-10-CM | POA: Insufficient documentation

## 2011-03-12 DIAGNOSIS — R279 Unspecified lack of coordination: Secondary | ICD-10-CM | POA: Insufficient documentation

## 2011-03-12 DIAGNOSIS — I10 Essential (primary) hypertension: Secondary | ICD-10-CM | POA: Insufficient documentation

## 2011-03-12 DIAGNOSIS — R197 Diarrhea, unspecified: Secondary | ICD-10-CM | POA: Insufficient documentation

## 2011-03-12 DIAGNOSIS — I6789 Other cerebrovascular disease: Secondary | ICD-10-CM | POA: Insufficient documentation

## 2011-03-12 DIAGNOSIS — R112 Nausea with vomiting, unspecified: Secondary | ICD-10-CM | POA: Insufficient documentation

## 2011-03-12 DIAGNOSIS — R5381 Other malaise: Secondary | ICD-10-CM | POA: Insufficient documentation

## 2011-03-12 DIAGNOSIS — Z79899 Other long term (current) drug therapy: Secondary | ICD-10-CM | POA: Insufficient documentation

## 2011-03-12 DIAGNOSIS — I7789 Other specified disorders of arteries and arterioles: Secondary | ICD-10-CM | POA: Insufficient documentation

## 2011-03-12 DIAGNOSIS — G939 Disorder of brain, unspecified: Secondary | ICD-10-CM | POA: Insufficient documentation

## 2011-03-12 DIAGNOSIS — N390 Urinary tract infection, site not specified: Secondary | ICD-10-CM | POA: Insufficient documentation

## 2011-03-12 DIAGNOSIS — H5500 Unspecified nystagmus: Secondary | ICD-10-CM | POA: Insufficient documentation

## 2011-03-12 DIAGNOSIS — R42 Dizziness and giddiness: Secondary | ICD-10-CM | POA: Insufficient documentation

## 2011-03-12 LAB — COMPREHENSIVE METABOLIC PANEL
ALT: 13 U/L (ref 0–35)
AST: 20 U/L (ref 0–37)
Albumin: 3.9 g/dL (ref 3.5–5.2)
Alkaline Phosphatase: 148 U/L — ABNORMAL HIGH (ref 39–117)
BUN: 42 mg/dL — ABNORMAL HIGH (ref 6–23)
CO2: 25 mEq/L (ref 19–32)
Calcium: 8.6 mg/dL (ref 8.4–10.5)
Chloride: 106 mEq/L (ref 96–112)
Creatinine, Ser: 2.11 mg/dL — ABNORMAL HIGH (ref 0.50–1.10)
GFR calc Af Amer: 25 mL/min — ABNORMAL LOW (ref >90)
GFR calc non Af Amer: 22 mL/min — ABNORMAL LOW (ref >90)
Glucose, Bld: 100 mg/dL — ABNORMAL HIGH (ref 70–99)
Potassium: 3.5 mEq/L (ref 3.5–5.1)
Sodium: 142 mEq/L (ref 135–145)
Total Bilirubin: 0.2 mg/dL — ABNORMAL LOW (ref 0.3–1.2)
Total Protein: 7.8 g/dL (ref 6.0–8.3)

## 2011-03-12 LAB — DIFFERENTIAL
Basophils Absolute: 0 10*3/uL (ref 0.0–0.1)
Basophils Relative: 0 % (ref 0–1)
Eosinophils Absolute: 0 10*3/uL (ref 0.0–0.7)
Eosinophils Relative: 0 % (ref 0–5)
Lymphocytes Relative: 7 % — ABNORMAL LOW (ref 12–46)
Lymphs Abs: 0.5 10*3/uL — ABNORMAL LOW (ref 0.7–4.0)
Monocytes Absolute: 0.2 10*3/uL (ref 0.1–1.0)
Monocytes Relative: 2 % — ABNORMAL LOW (ref 3–12)
Neutro Abs: 7.6 10*3/uL (ref 1.7–7.7)
Neutrophils Relative %: 92 % — ABNORMAL HIGH (ref 43–77)

## 2011-03-12 LAB — URINALYSIS, ROUTINE W REFLEX MICROSCOPIC
Bilirubin Urine: NEGATIVE
Glucose, UA: NEGATIVE mg/dL
Ketones, ur: NEGATIVE mg/dL
Nitrite: NEGATIVE
Protein, ur: 30 mg/dL — AB
Specific Gravity, Urine: 1.019 (ref 1.005–1.030)
Urobilinogen, UA: 0.2 mg/dL (ref 0.0–1.0)
pH: 5.5 (ref 5.0–8.0)

## 2011-03-12 LAB — URINE MICROSCOPIC-ADD ON

## 2011-03-12 LAB — CBC
HCT: 37.3 % (ref 36.0–46.0)
Hemoglobin: 12.2 g/dL (ref 12.0–15.0)
MCH: 29.9 pg (ref 26.0–34.0)
MCHC: 32.7 g/dL (ref 30.0–36.0)
MCV: 91.4 fL (ref 78.0–100.0)
Platelets: 182 10*3/uL (ref 150–400)
RBC: 4.08 MIL/uL (ref 3.87–5.11)
RDW: 13.6 % (ref 11.5–15.5)
WBC: 8.3 10*3/uL (ref 4.0–10.5)

## 2011-03-12 LAB — CARDIAC PANEL(CRET KIN+CKTOT+MB+TROPI)
CK, MB: 5.2 ng/mL — ABNORMAL HIGH (ref 0.3–4.0)
Relative Index: 2.5 (ref 0.0–2.5)
Total CK: 209 U/L — ABNORMAL HIGH (ref 7–177)
Troponin I: <0.3 ng/mL (ref <0.30)

## 2011-03-12 LAB — LIPASE, BLOOD: Lipase: 62 U/L — ABNORMAL HIGH (ref 11–59)

## 2011-03-12 MED ORDER — SODIUM CHLORIDE 0.9 % IV BOLUS (SEPSIS)
1000.0000 mL | Freq: Once | INTRAVENOUS | Status: AC
  Administered 2011-03-12: 1000 mL via INTRAVENOUS

## 2011-03-12 MED ORDER — ONDANSETRON HCL 4 MG PO TABS: 4.0000 mg | ORAL_TABLET | Freq: Four times a day (QID) | ORAL | Status: AC

## 2011-03-12 MED ORDER — MORPHINE SULFATE 4 MG/ML IJ SOLN
INTRAMUSCULAR | Status: AC
  Administered 2011-03-12: 4 mg
  Filled 2011-03-12: qty 1

## 2011-03-12 MED ORDER — ONDANSETRON HCL 4 MG/2ML IJ SOLN
4.0000 mg | Freq: Once | INTRAMUSCULAR | Status: AC
  Administered 2011-03-12: 4 mg via INTRAVENOUS
  Filled 2011-03-12: qty 2

## 2011-03-12 MED ORDER — CEPHALEXIN 500 MG PO CAPS: 500.0000 mg | ORAL_CAPSULE | Freq: Four times a day (QID) | ORAL | Status: AC

## 2011-03-12 NOTE — ED Notes (Signed)
[id number] Expected date:03/12/11<BR> Expected time: 6:31 AM<BR> Means of arrival:Ambulance<BR> Comments:<BR> EMS n/v/d

## 2011-03-12 NOTE — ED Notes (Signed)
MD at bedside.  Pt stating that she has had several episodes of diarrhea and vomiting.  Pt states she is intermittently dizzy also.

## 2011-03-12 NOTE — ED Notes (Signed)
Lab at bedside to draw

## 2011-03-12 NOTE — ED Notes (Signed)
Lab x2 unable to obtain bloodwork. IV saline locked. In 15 minutes, will attempt to draw off IV. Pt sts pain and nausea remain relieved at this time.

## 2011-03-12 NOTE — ED Notes (Signed)
Pt transported to MRI. Horris Latino, MRI tech called and stated pt was unable to lie flat and was actively vomiting, requesting meds. Pain med order received from [place], [place] and RN walked meds to MRI and medicated pt. Will reassess upon return to dept.

## 2011-03-12 NOTE — ED Provider Notes (Signed)
History     CSN: [id number]  Arrival date & time 03/12/11  [id number]   First MD Initiated Contact with Patient 03/12/11 [phone]      Chief Complaint  Patient presents with  . Nausea  . Hypertension    (Consider location/radiation/quality/duration/timing/severity/associated sxs/prior treatment) HPI Comments: Patient presents via EMS for 2 hours of nausea, vomiting and diarrhea it started around 5:30 AM. She felt well and treatment. She denies any sick contacts. She's had 5 episodes of nonbilious nonbloody emesis and 2 episodes of loose nonbloody stools. She also endorses crampy abdominal pain with vomiting otherwise no pain. He also endorses some lightheadedness and dizziness with standing he feels that the room is spinning if she sits up in bed. Denies any chest pain, shortness of breath, urinary or vaginal symptoms. Denies any fever. She has a history of chronic renal insufficiency, pericarditis anemia.  The history is provided by the patient.    Past Medical History  Diagnosis Date  . Depression   . Hypertension   . Renal failure     Formerly on hemodialysis; managed by Dr. Corliss Parish  . Gastritis, Helicobacter pylori     Noted on EGD 01/18/2009 by [name];  biopsy showed chronic gastritis with Helicobacter pylori, treated by [name].  . Pericardial effusion 11/2004    S/P subxiphoid pericardial window 11/01/2004 by [name].  A question of possible collagen vascular disease had been raised in 2006 due to arthralgias, a history of idiopathic pericarditis, and increased pigmentation of her palms, and she was referred to [name] but he was unable to confirm a rheumatologic diagnosis.  [name] Hemorrhoids, internal 05/09/2005     Found on colonoscopy 05/09/2005 by [name]  . Cataracts, bilateral   . Pinguecula   . Vitreous detachment   . Thigh pain   . Epicondylitis   . Anemia, iron deficiency   . Angiodysplasia of colon 05/09/2005    Single  small angiodysplastic lesion in the descending colon found on colonoscopy 05/09/2005 by [name]  . Anemia due to blood loss, chronic   . Stiffness of joint, not elsewhere classified, hand   . S/P hysterectomy     Past Surgical History  Procedure Date  . Subxyphoid pericardial window 11/01/2004  . Arteriovenous graft placement 11/28/2005     Left forearm arteriovenous graft.  . Thrombectomy / arteriovenous graft revision 01/21/2006    Thrombectomy and revision of left forearm loop AV graft.  . Thrombectomy  03/06/2006    Simple thrombectomy arteriovenous Gore-Tex graft, left arm, with exploration of venous end and intraoperative shuntogram.           Family History  Problem Relation Age of Onset  . Hypertension Brother   . Heart disease Father   . Diabetes Brother   . Breast cancer Neg Hx   . Colon cancer Neg Hx   . Lung cancer Neg Hx   . Cervical cancer Neg Hx   . Sickle cell anemia Neg Hx     History  Substance Use Topics  . Smoking status: Former Smoker -- 0.1 packs/day for 1 years    Quit date: 01/02/1960  . Smokeless tobacco: Never Used  . Alcohol Use: No    OB History    Grav Para Term Preterm Abortions TAB SAB Ect Mult Living                  Review of Systems  Constitutional: Positive for activity change and  fatigue. Negative for fever.  HENT: Negative for congestion and rhinorrhea.   Eyes: Negative for visual disturbance.  Respiratory: Negative for cough, chest tightness and shortness of breath.   Cardiovascular: Negative for chest pain.  Gastrointestinal: Positive for nausea, vomiting and diarrhea. Negative for abdominal pain.  Genitourinary: Negative for dysuria and hematuria.  Musculoskeletal: Negative for back pain.  Neurological: Positive for dizziness and light-headedness. Negative for syncope, weakness and headaches.    Allergies  Review of patient's allergies indicates no known allergies.  Home Medications   Current Outpatient Rx    Name Route Sig Dispense Refill  . AMLODIPINE BESYLATE 10 MG PO TABS Oral Take 1 tablet (10 mg total) by mouth at bedtime. 30 tablet 11  . BUPROPION HCL ER (XL) 150 MG PO TB24 Oral Take 150 mg by mouth daily.    [name] CINACALCET HCL 30 MG PO TABS Oral Take 30 mg by mouth every other day. With meals.    . FUROSEMIDE 80 MG PO TABS Oral Take 80 mg by mouth 2 (two) times daily.     Hezzie Bump OP Both Eyes Place 1 drop into both eyes 2 (two) times daily at 10 AM and 5 PM.    . LOTEPREDNOL ETABONATE 0.2 % OP SUSP Both Eyes Place 1 drop into both eyes 3 (three) times daily.    . CENTRUM SILVER PO Oral Take 1 tablet by mouth daily.      [name] OMEPRAZOLE 20 MG PO CPDR Oral Take 1 capsule (20 mg total) by mouth 2 (two) times daily. 60 capsule 11  . CEPHALEXIN 500 MG PO CAPS Oral Take 1 capsule (500 mg total) by mouth 4 (four) times daily. 40 capsule 0  . ONDANSETRON HCL 4 MG PO TABS Oral Take 1 tablet (4 mg total) by mouth every 6 (six) hours. 12 tablet 0    BP 145/90  Pulse 82  Temp(Src) 98.6 F (37 C) (Oral)  Resp 18  Ht 5\' 2"  (1.575 m)  Wt 169 lb (76.658 kg)  BMI 30.91 kg/m2  SpO2 96%  Physical Exam  Constitutional: She is oriented to person, place, and time. She appears well-developed and well-nourished. No distress.  HENT:  Head: Normocephalic and atraumatic.  Mouth/Throat: Oropharynx is clear and moist. No oropharyngeal exudate.  Eyes: Conjunctivae are normal. Pupils are equal, round, and reactive to light.  Neck: Normal range of motion.  Cardiovascular: Normal rate, regular rhythm and normal heart sounds.   No murmur heard. Pulmonary/Chest: Effort normal and breath sounds normal. No respiratory distress.  Abdominal: Soft. There is no tenderness. There is no rebound and no guarding.  Musculoskeletal: Normal range of motion. She exhibits no edema and no tenderness.  Neurological: She is alert and oriented to person, place, and time. No cranial nerve deficit.       Cranial nerves II through XII  intact, patient has direction changing nystagmus on lateral gaze as well as vertical nystagmus. She is somewhat ataxic on finger to nose. She has 5 out of 5 strength in bilateral upper and lower extremities.  +test of skew.  No catch up saccades on head impulse testing.  Skin: Skin is warm.    ED Course  Procedures (including critical care time)  Labs Reviewed  DIFFERENTIAL - Abnormal; Notable for the following:    Neutrophils Relative 92 (*)    Lymphocytes Relative 7 (*)    Lymphs Abs 0.5 (*)    Monocytes Relative 2 (*)    All other components within  normal limits  COMPREHENSIVE METABOLIC PANEL - Abnormal; Notable for the following:    Glucose, Bld 100 (*)    BUN 42 (*)    Creatinine, Ser 2.11 (*)    Alkaline Phosphatase 148 (*)    Total Bilirubin 0.2 (*)    GFR calc non Af Amer 22 (*)    GFR calc Af Amer 25 (*)    All other components within normal limits  URINALYSIS, ROUTINE W REFLEX MICROSCOPIC - Abnormal; Notable for the following:    Hgb urine dipstick TRACE (*)    Protein, ur 30 (*)    Leukocytes, UA LARGE (*)    All other components within normal limits  CARDIAC PANEL(CRET KIN+CKTOT+MB+TROPI) - Abnormal; Notable for the following:    Total CK 209 (*)    CK, MB 5.2 (*)    All other components within normal limits  LIPASE, BLOOD - Abnormal; Notable for the following:    Lipase 62 (*)    All other components within normal limits  URINE MICROSCOPIC-ADD ON - Abnormal; Notable for the following:    Squamous Epithelial / LPF FEW (*)    Bacteria, UA MANY (*)    Casts HYALINE CASTS (*)    All other components within normal limits  CBC   Mr Virgel Paling Wo Contrast  03/12/2011  *RADIOLOGY REPORT*  Clinical Data:  Vertigo and nystagmus.  MRI HEAD WITHOUT CONTRAST MRA HEAD WITHOUT CONTRAST MRA NECK WITHOUT CONTRAST  Technique:  Multiplanar, multiecho pulse sequences of the brain and surrounding structures were obtained without intravenous contrast. Angiographic images of the Circle  of Willis were obtained using MRA technique without intravenous contrast.  Angiographic images of the neck were obtained using MRA technique without intravenous contrast.  Carotid stenosis measurements (when applicable) are obtained utilizing NASCET criteria, using the distal internal carotid diameter as the denominator.  Comparison:   None.  MRI HEAD  Findings:  The diffusion weighted images demonstrate no evidence for acute or subacute infarction.  The patient does have periventricular subcortical white matter disease which is slightly advanced for age.  The patient has a single focal area of susceptibility in the superior left temporal lobe.  No other hemorrhage is present.  Flow is present in the major intracranial arteries.  A 14 x 17 x 14 mm extra-axial mass lesion is present along the planum sphenoidale allay, compatible with a meningioma.  The globes and orbits are intact.  The paranasal sinuses and mastoid air cells are clear.  IMPRESSION:  1.  17 mm extra-axial mass lesion along the plane is seen at L8 is most compatible with a meningioma. 2.  No evidence for acute infarct or focal lesion to explain the patient's symptoms. 3.  Mild periventricular subcortical white matter disease. 4.  Single focus of susceptibility in the right temporal lobe is most compatible with a punctate remote hemorrhage.  This is nonspecific.  MRA HEAD  Findings: The internal carotid arteries are within normal limits from the high cervical segments through the ICA termini bilaterally.  The right A1 segment is aplastic.  The left A1 segment is normal.  Anterior communicating artery is patent.  Both A2 segments appear normal.  The M1 segments are normal bilaterally. The MCA bifurcations are unremarkable.  There is some segmental irregularity of MCA branch vessels bilaterally.  The left vertebral artery is the dominant vessel.  The vertebral basilar junction is within normal limits.  The basilar artery is within normal limits.  Both  posterior cerebral arteries  originate from the basilar tip.  There is some segmental irregularity of distal PCA branch vessels.  IMPRESSION:  1.  Mild small vessel disease. 2.  No significant proximal stenosis, aneurysm, or branch vessel occlusion.  MRA NECK  Findings: Time-of-flight images are mildly degraded by patient motion.  There is slight signal loss the proximal internal carotid arteries bilaterally which is likely artifactual.  No significant flow disturbance is evident.  Flow is antegrade within the vertebral arteries bilaterally.  IMPRESSION: No significant stenosis in the neck.  Original Report Authenticated By: Resa Miner. MATTERN, M.D.   Mr Angiogram Neck Wo Contrast  03/12/2011  *RADIOLOGY REPORT*  Clinical Data:  Vertigo and nystagmus.  MRI HEAD WITHOUT CONTRAST MRA HEAD WITHOUT CONTRAST MRA NECK WITHOUT CONTRAST  Technique:  Multiplanar, multiecho pulse sequences of the brain and surrounding structures were obtained without intravenous contrast. Angiographic images of the Circle of Willis were obtained using MRA technique without intravenous contrast.  Angiographic images of the neck were obtained using MRA technique without intravenous contrast.  Carotid stenosis measurements (when applicable) are obtained utilizing NASCET criteria, using the distal internal carotid diameter as the denominator.  Comparison:   None.  MRI HEAD  Findings:  The diffusion weighted images demonstrate no evidence for acute or subacute infarction.  The patient does have periventricular subcortical white matter disease which is slightly advanced for age.  The patient has a single focal area of susceptibility in the superior left temporal lobe.  No other hemorrhage is present.  Flow is present in the major intracranial arteries.  A 14 x 17 x 14 mm extra-axial mass lesion is present along the planum sphenoidale allay, compatible with a meningioma.  The globes and orbits are intact.  The paranasal sinuses and mastoid air  cells are clear.  IMPRESSION:  1.  17 mm extra-axial mass lesion along the plane is seen at L8 is most compatible with a meningioma. 2.  No evidence for acute infarct or focal lesion to explain the patient's symptoms. 3.  Mild periventricular subcortical white matter disease. 4.  Single focus of susceptibility in the right temporal lobe is most compatible with a punctate remote hemorrhage.  This is nonspecific.  MRA HEAD  Findings: The internal carotid arteries are within normal limits from the high cervical segments through the ICA termini bilaterally.  The right A1 segment is aplastic.  The left A1 segment is normal.  Anterior communicating artery is patent.  Both A2 segments appear normal.  The M1 segments are normal bilaterally. The MCA bifurcations are unremarkable.  There is some segmental irregularity of MCA branch vessels bilaterally.  The left vertebral artery is the dominant vessel.  The vertebral basilar junction is within normal limits.  The basilar artery is within normal limits.  Both posterior cerebral arteries originate from the basilar tip.  There is some segmental irregularity of distal PCA branch vessels.  IMPRESSION:  1.  Mild small vessel disease. 2.  No significant proximal stenosis, aneurysm, or branch vessel occlusion.  MRA NECK  Findings: Time-of-flight images are mildly degraded by patient motion.  There is slight signal loss the proximal internal carotid arteries bilaterally which is likely artifactual.  No significant flow disturbance is evident.  Flow is antegrade within the vertebral arteries bilaterally.  IMPRESSION: No significant stenosis in the neck.  Original Report Authenticated By: Resa Miner. MATTERN, M.D.   Mr Brain Wo Contrast  03/12/2011  *RADIOLOGY REPORT*  Clinical Data:  Vertigo and nystagmus.  MRI HEAD WITHOUT CONTRAST  MRA HEAD WITHOUT CONTRAST MRA NECK WITHOUT CONTRAST  Technique:  Multiplanar, multiecho pulse sequences of the brain and surrounding structures were  obtained without intravenous contrast. Angiographic images of the Circle of Willis were obtained using MRA technique without intravenous contrast.  Angiographic images of the neck were obtained using MRA technique without intravenous contrast.  Carotid stenosis measurements (when applicable) are obtained utilizing NASCET criteria, using the distal internal carotid diameter as the denominator.  Comparison:   None.  MRI HEAD  Findings:  The diffusion weighted images demonstrate no evidence for acute or subacute infarction.  The patient does have periventricular subcortical white matter disease which is slightly advanced for age.  The patient has a single focal area of susceptibility in the superior left temporal lobe.  No other hemorrhage is present.  Flow is present in the major intracranial arteries.  A 14 x 17 x 14 mm extra-axial mass lesion is present along the planum sphenoidale allay, compatible with a meningioma.  The globes and orbits are intact.  The paranasal sinuses and mastoid air cells are clear.  IMPRESSION:  1.  17 mm extra-axial mass lesion along the plane is seen at L8 is most compatible with a meningioma. 2.  No evidence for acute infarct or focal lesion to explain the patient's symptoms. 3.  Mild periventricular subcortical white matter disease. 4.  Single focus of susceptibility in the right temporal lobe is most compatible with a punctate remote hemorrhage.  This is nonspecific.  MRA HEAD  Findings: The internal carotid arteries are within normal limits from the high cervical segments through the ICA termini bilaterally.  The right A1 segment is aplastic.  The left A1 segment is normal.  Anterior communicating artery is patent.  Both A2 segments appear normal.  The M1 segments are normal bilaterally. The MCA bifurcations are unremarkable.  There is some segmental irregularity of MCA branch vessels bilaterally.  The left vertebral artery is the dominant vessel.  The vertebral basilar junction is  within normal limits.  The basilar artery is within normal limits.  Both posterior cerebral arteries originate from the basilar tip.  There is some segmental irregularity of distal PCA branch vessels.  IMPRESSION:  1.  Mild small vessel disease. 2.  No significant proximal stenosis, aneurysm, or branch vessel occlusion.  MRA NECK  Findings: Time-of-flight images are mildly degraded by patient motion.  There is slight signal loss the proximal internal carotid arteries bilaterally which is likely artifactual.  No significant flow disturbance is evident.  Flow is antegrade within the vertebral arteries bilaterally.  IMPRESSION: No significant stenosis in the neck.  Original Report Authenticated By: Resa Miner. MATTERN, M.D.     1. Nausea and vomiting   2. Urinary tract infection       MDM  2 hours of nausea, vomiting, diarrhea. Abdomen soft, nontender vitals stable.  Patient's direction changing nystagmus with ataxia and vertical nystagmus is concerning for CNS source of vertigo.  Will obtain MRI.  Dizziness and nausea have resolved. No acute infarct seen on MRI. Patient informed of incidental meningioma. Small area of punctate hemorrhage discussed with Dr. Nicole Kindred of neurology.  Appears remote, nothing to do.   Patient feeling much improved. She said no further nausea or vomiting. She had walked to the bathroom without a problem. Her creatinine is elevated but stable. We'll treat her UTI.   Date: 03/12/2011  Rate: 77  Rhythm: normal sinus rhythm  QRS Axis: normal  Intervals: normal  ST/T Wave abnormalities: normal  Conduction Disutrbances:none  Narrative Interpretation:   Old EKG Reviewed: unchanged    Ezequiel Essex, MD 03/12/11 [phone]

## 2011-03-12 NOTE — ED Notes (Signed)
Pt comes from home where she called EMS due to N/V/D x 1 hour and hypertension.

## 2011-03-12 NOTE — Discharge Instructions (Signed)
uUrinary Tract Infection Infections of the urinary tract can start in several places. A bladder infection (cystitis), a kidney infection (pyelonephritis), and a prostate infection (prostatitis) are different types of urinary tract infections (UTIs). They usually get better if treated with medicines (antibiotics) that kill germs. Take all the medicine until it is gone. You or your child may feel better in a few days, but TAKE ALL MEDICINE or the infection may not respond and may become more difficult to treat. HOME CARE INSTRUCTIONS   Drink enough water and fluids to keep the urine clear or pale yellow. Cranberry juice is especially recommended, in addition to large amounts of water.   Avoid caffeine, tea, and carbonated beverages. They tend to irritate the bladder.   Alcohol may irritate the prostate.   Only take over-the-counter or prescription medicines for pain, discomfort, or fever as directed by your caregiver.  To prevent further infections:  Empty the bladder often. Avoid holding urine for long periods of time.   After a bowel movement, women should cleanse from front to back. Use each tissue only once.   Empty the bladder before and after sexual intercourse.  FINDING OUT THE RESULTS OF YOUR TEST Not all test results are available during your visit. If your or your child's test results are not back during the visit, make an appointment with your caregiver to find out the results. Do not assume everything is normal if you have not heard from your caregiver or the medical facility. It is important for you to follow up on all test results. SEEK MEDICAL CARE IF:   There is back pain.   Your baby is older than 3 months with a rectal temperature of 100.5 F (38.1 C) or higher for more than 1 day.   Your or your child's problems (symptoms) are no better in 3 days. Return sooner if you or your child is getting worse.  SEEK IMMEDIATE MEDICAL CARE IF:   There is severe back pain or lower  abdominal pain.   You or your child develops chills.   You have a fever.   Your baby is older than 3 months with a rectal temperature of 102 F (38.9 C) or higher.   Your baby is 36 months old or younger with a rectal temperature of 100.4 F (38 C) or higher.   There is nausea or vomiting.   There is continued burning or discomfort with urination.  MAKE SURE YOU:   Understand these instructions.   Will watch your condition.   Will get help right away if you are not doing well or get worse.  Document Released: 09/27/2004 Document Revised: 12/07/2010 Document Reviewed: 05/02/2006 [facility] Patient Information 2012 [place].Nausea and Vomiting Nausea is a sick feeling that often comes before throwing up (vomiting). Vomiting is a reflex where stomach contents come out of your mouth. Vomiting can cause severe loss of body fluids (dehydration). Children and elderly adults can become dehydrated quickly, especially if they also have diarrhea. Nausea and vomiting are symptoms of a condition or disease. It is important to find the cause of your symptoms. CAUSES   Direct irritation of the stomach lining. This irritation can result from increased acid production (gastroesophageal reflux disease), infection, food poisoning, taking certain medicines (such as nonsteroidal anti-inflammatory drugs), alcohol use, or tobacco use.   Signals from the brain.These signals could be caused by a headache, heat exposure, an inner ear disturbance, increased pressure in the brain from injury, infection, a tumor, or a  concussion, pain, emotional stimulus, or metabolic problems.   An obstruction in the gastrointestinal tract (bowel obstruction).   Illnesses such as diabetes, hepatitis, gallbladder problems, appendicitis, kidney problems, cancer, sepsis, atypical symptoms of a heart attack, or eating disorders.   Medical treatments such as chemotherapy and radiation.   Receiving medicine that makes you  sleep (general anesthetic) during surgery.  DIAGNOSIS Your caregiver may ask for tests to be done if the problems do not improve after a few days. Tests may also be done if symptoms are severe or if the reason for the nausea and vomiting is not clear. Tests may include:  Urine tests.   Blood tests.   Stool tests.   Cultures (to look for evidence of infection).   X-rays or other imaging studies.  Test results can help your caregiver make decisions about treatment or the need for additional tests. TREATMENT You need to stay well hydrated. Drink frequently but in small amounts.You may wish to drink water, sports drinks, clear broth, or eat frozen ice pops or gelatin dessert to help stay hydrated.When you eat, eating slowly may help prevent nausea.There are also some antinausea medicines that may help prevent nausea. HOME CARE INSTRUCTIONS   Take all medicine as directed by your caregiver.   If you do not have an appetite, do not force yourself to eat. However, you must continue to drink fluids.   If you have an appetite, eat a normal diet unless your caregiver tells you differently.   Eat a variety of complex carbohydrates (rice, wheat, potatoes, bread), lean meats, yogurt, fruits, and vegetables.   Avoid high-fat foods because they are more difficult to digest.   Drink enough water and fluids to keep your urine clear or pale yellow.   If you are dehydrated, ask your caregiver for specific rehydration instructions. Signs of dehydration may include:   Severe thirst.   Dry lips and mouth.   Dizziness.   Dark urine.   Decreasing urine frequency and amount.   Confusion.   Rapid breathing or pulse.  SEEK IMMEDIATE MEDICAL CARE IF:   You have blood or brown flecks (like coffee grounds) in your vomit.   You have black or bloody stools.   You have a severe headache or stiff neck.   You are confused.   You have severe abdominal pain.   You have chest pain or trouble  breathing.   You do not urinate at least once every 8 hours.   You develop cold or clammy skin.   You continue to vomit for longer than 24 to 48 hours.   You have a fever.  MAKE SURE YOU:   Understand these instructions.   Will watch your condition.   Will get help right away if you are not doing well or get worse.  Document Released: 12/18/2004 Document Revised: 12/07/2010 Document Reviewed: 05/17/2010 [employer] Patient Information 2012 [employer]. 

## 2011-03-12 NOTE — ED Notes (Signed)
Pt returned from MRI °

## 2011-03-21 ENCOUNTER — Encounter: Payer: Self-pay | Admitting: Internal Medicine

## 2011-03-21 ENCOUNTER — Ambulatory Visit (INDEPENDENT_AMBULATORY_CARE_PROVIDER_SITE_OTHER): Payer: PRIVATE HEALTH INSURANCE | Admitting: Internal Medicine

## 2011-03-21 VITALS — BP 133/87 | HR 90 | Temp 98.2°F | Ht 62.0 in | Wt 175.0 lb

## 2011-03-21 DIAGNOSIS — N19 Unspecified kidney failure: Secondary | ICD-10-CM

## 2011-03-21 DIAGNOSIS — R42 Dizziness and giddiness: Secondary | ICD-10-CM

## 2011-03-21 DIAGNOSIS — I10 Essential (primary) hypertension: Secondary | ICD-10-CM

## 2011-03-21 DIAGNOSIS — F329 Major depressive disorder, single episode, unspecified: Secondary | ICD-10-CM

## 2011-03-21 DIAGNOSIS — N390 Urinary tract infection, site not specified: Secondary | ICD-10-CM | POA: Insufficient documentation

## 2011-03-21 DIAGNOSIS — I129 Hypertensive chronic kidney disease with stage 1 through stage 4 chronic kidney disease, or unspecified chronic kidney disease: Secondary | ICD-10-CM

## 2011-03-21 DIAGNOSIS — N189 Chronic kidney disease, unspecified: Secondary | ICD-10-CM

## 2011-03-21 HISTORY — DX: Urinary tract infection, site not specified: N39.0

## 2011-03-21 NOTE — Progress Notes (Signed)
Subjective:     Patient ID: [name], female   DOB: 11-14-1934, 77 y.o.   MRN: [id number]  HPI [name] is a 77 year old female who returns for followup of ED visit and depression. She was last seen by Dr. Marinda Elk 03/02/11. During that visit she was evaluated for worsening depression and managed with the initiation of Prozac with discontinuation of bupropion XL secondary to dosage limitations in the setting of the patient's renal failure. Since that time she was seen at the emergency department 03/12/11 for the nausea, vomiting, and dizziness and diagnosed with UTI. She was started on cephalexin and reports improvement in her symptoms. Of note the patient is upset that Prozac was added to her medication list and denies any depressive symptoms today. "I don't know why Dr. Marinda Elk put me on that medicine because I'm not depressed".  After conferring with Dr. Marinda Elk, patient clarified that she initially agree to treatment with Prozac but after filling the prescription, reading the side effect pamphlet, and having her family internet search information on Prozac she became fearful that the drug may cause homicidal or suicidal side effects. It was further clarified that she will continue on prior dosage of bupropion XL 150 mg daily given her reports that her mood is stable and improved since resolution of her UTI.  The patient shared with Dr. Viviano Simas that she and her family are trying to switch her medical care to another physician that they prefer outside of the [facility].   Review of Systems  Constitutional: Negative for fever.  HENT: Negative for congestion.   Respiratory: Negative for cough and shortness of breath.   Cardiovascular: Negative for chest pain.  Genitourinary: Negative for dysuria and hematuria.  Neurological: Negative for weakness and headaches.  Psychiatric/Behavioral: Negative for dysphoric mood.       Objective:   Physical Exam  Constitutional: She is oriented to  person, place, and time. She appears well-developed and well-nourished. No distress.  HENT:  Head: Normocephalic and atraumatic.  Eyes: Conjunctivae and EOM are normal. Pupils are equal, round, and reactive to light.  Neck: Normal range of motion. Neck supple.  Cardiovascular: Normal rate, regular rhythm, normal heart sounds and intact distal pulses.   Pulmonary/Chest: Effort normal and breath sounds normal.  Abdominal: Soft. Bowel sounds are normal.  Musculoskeletal: Normal range of motion. She exhibits no edema.  Neurological: She is alert and oriented to person, place, and time.  Skin: Skin is warm and dry.  Psychiatric: Her speech is normal. Judgment and thought content normal. Her mood appears not anxious. She is agitated. Cognition and memory are normal. She does not exhibit a depressed mood.       Assessment:     1. UTI: improvement in sx, on cephalosporins 2. Depression: stable, pt does not think she will need the services of [facility] and furthermore states that she is not depressed but would like to continue with Bupropion therapy 3. Renal Failure: elevated but stable Creatinine of 2.11, on Lasix 80 mg qd and Sensipar 4. Hypertension: controlled bp 133/87 pulse 90 on amlodipine 10 mg     Plan:     -continue course of Cephalexin -continue Bupropion XL 150 mg qd -pt counseled to contact clinic or [place] if she feels her mood is worsening and would like to try alternative therapy -will follow-up with Dr. Moshe Cipro of nephrology in April 2013 per pt report -continue current regimen

## 2011-03-21 NOTE — Patient Instructions (Signed)
It was nice to meet you today, [name]. I am glad to hear that your nausea, vomiting, and dizziness has improved. I am also glad to hear that your mood has been fine. Continue to take your medicines as prescribed and followup with Dr. Moshe Cipro in April.

## 2011-04-09 ENCOUNTER — Ambulatory Visit: Payer: PRIVATE HEALTH INSURANCE

## 2011-04-13 ENCOUNTER — Ambulatory Visit
Admission: RE | Admit: 2011-04-13 | Discharge: 2011-04-13 | Disposition: A | Discharge status: Home / Self Care | Payer: PRIVATE HEALTH INSURANCE | Source: Ambulatory Visit | Attending: Internal Medicine | Admitting: Internal Medicine

## 2011-04-13 DIAGNOSIS — Z1231 Encounter for screening mammogram for malignant neoplasm of breast: Secondary | ICD-10-CM

## 2011-04-22 ENCOUNTER — Other Ambulatory Visit: Payer: Self-pay | Admitting: Internal Medicine

## 2011-07-10 ENCOUNTER — Other Ambulatory Visit: Payer: Self-pay | Admitting: Internal Medicine

## 2011-07-27 ENCOUNTER — Encounter: Payer: Self-pay | Admitting: Internal Medicine

## 2011-07-27 ENCOUNTER — Ambulatory Visit (INDEPENDENT_AMBULATORY_CARE_PROVIDER_SITE_OTHER): Payer: PRIVATE HEALTH INSURANCE | Admitting: Internal Medicine

## 2011-07-27 VITALS — BP 150/89 | HR 67 | Temp 98.7°F | Ht 62.0 in | Wt 179.1 lb

## 2011-07-27 DIAGNOSIS — R42 Dizziness and giddiness: Secondary | ICD-10-CM

## 2011-07-27 DIAGNOSIS — I951 Orthostatic hypotension: Secondary | ICD-10-CM

## 2011-07-27 DIAGNOSIS — I319 Disease of pericardium, unspecified: Secondary | ICD-10-CM

## 2011-07-27 DIAGNOSIS — N390 Urinary tract infection, site not specified: Secondary | ICD-10-CM

## 2011-07-27 DIAGNOSIS — I10 Essential (primary) hypertension: Secondary | ICD-10-CM

## 2011-07-27 DIAGNOSIS — F329 Major depressive disorder, single episode, unspecified: Secondary | ICD-10-CM

## 2011-07-27 DIAGNOSIS — N19 Unspecified kidney failure: Secondary | ICD-10-CM

## 2011-07-27 LAB — COMPLETE METABOLIC PANEL WITH GFR
ALT: 16 U/L (ref 0–35)
AST: 22 U/L (ref 0–37)
Albumin: 3.9 g/dL (ref 3.5–5.2)
Alkaline Phosphatase: 164 U/L — ABNORMAL HIGH (ref 39–117)
BUN: 43 mg/dL — ABNORMAL HIGH (ref 6–23)
CO2: 31 mEq/L (ref 19–32)
Calcium: 9 mg/dL (ref 8.4–10.5)
Chloride: 99 mEq/L (ref 96–112)
Creat: 2.41 mg/dL — ABNORMAL HIGH (ref 0.50–1.10)
GFR, Est African American: 22 mL/min — ABNORMAL LOW
GFR, Est Non African American: 19 mL/min — ABNORMAL LOW
Glucose, Bld: 97 mg/dL (ref 70–99)
Potassium: 4.2 mEq/L (ref 3.5–5.3)
Sodium: 140 mEq/L (ref 135–145)
Total Bilirubin: 0.2 mg/dL — ABNORMAL LOW (ref 0.3–1.2)
Total Protein: 7.6 g/dL (ref 6.0–8.3)

## 2011-07-27 NOTE — Assessment & Plan Note (Addendum)
Pt with orthostatic hypotension, dry mm, in context of decreased PO intake for two weeks, decreased sleep. Concern for hyperuremia, worsening CKD. Will check for UTI as well. Pt will be called if CMP is concerning for sudden rise in creatinine, K, BUN for possible hospital admission.   --CMP stat --urinalysis

## 2011-07-27 NOTE — Progress Notes (Signed)
  Subjective:    Patient ID: [name], female    DOB: Nov 13, 1934, 77 y.o.   MRN: [id number]  HPI [name] is a 77 year-old AA woman with PMH significant for CKD stage IV, remote hx of pericarditis, and depression, who comes in today for evaluation of dizziness. She has been dizzy upon standing for the past 2-3 days. She denies vertigo, syncope, fall, or blurry vision. She has had decreased appetite, decreased PO intake, and difficulty sleeping for the last week. For the past two weeks she has been preoccupied with getting her house remodeled and has skipped meals, decreased her fluid intake. Today she continues to be dizzy upon standing with occasional nausea but no vomiting.   In addition, for months now she has had left shoulder pain/ache that comes and goes 1-2 times per week and subsides on its own and right knee pain that has recurred 1-2 per week but responds well to Tylenol.    Review of Systems  Constitutional: Positive for appetite change. Negative for fever, chills, diaphoresis, activity change, fatigue and unexpected weight change.  Eyes: Negative for visual disturbance.  Respiratory: Negative for cough, chest tightness and shortness of breath.   Cardiovascular: Negative for chest pain, palpitations and leg swelling.  Gastrointestinal: Negative for abdominal pain and abdominal distention.  Genitourinary: Negative for dysuria, frequency and difficulty urinating.  Musculoskeletal: Positive for arthralgias.  Neurological: Positive for dizziness. Negative for syncope, light-headedness and headaches.  Psychiatric/Behavioral: Positive for disturbed wake/sleep cycle. Negative for confusion and agitation.       Objective:   Physical Exam  Constitutional: She is oriented to person, place, and time. She appears well-developed and well-nourished. No distress.       Very pleasant  HENT:  Head: Normocephalic and atraumatic.       Dry MM  Eyes: Conjunctivae are normal. Right eye  exhibits no discharge. Left eye exhibits no discharge. No scleral icterus.  Neck: No JVD present.  Cardiovascular: Normal rate, normal heart sounds and intact distal pulses.  Exam reveals no gallop and no friction rub.   No murmur heard. Pulmonary/Chest: Effort normal and breath sounds normal. No respiratory distress. She has no wheezes. She has no rales. She exhibits no tenderness.  Abdominal: Soft. There is no tenderness.  Musculoskeletal: She exhibits no edema and no tenderness.       Normal ROM for right knee and Right shoulder. Both joints with no swelling or erythema, no point tenderness.   Neurological: She is alert and oriented to person, place, and time. She has normal reflexes.  Skin: Skin is warm and dry. She is not diaphoretic. No erythema.  Psychiatric: She has a normal mood and affect. Her behavior is normal.          Assessment & Plan:

## 2011-07-27 NOTE — Assessment & Plan Note (Signed)
Pt asymptomatic today, but given dizziness, nausea, will check urinalysis.   --ordered urinalysis.

## 2011-07-27 NOTE — Patient Instructions (Addendum)
--  Call us if your dizziness gets worse. Stand up slowly to prevent falls.  --Drink fluids freely --Don't take Lasix until Monday  --Start taking only 40mg  on Monday.  --Take only 5 mg of amlodipine until you come back to clinic. --Avoid eating foods rich in potassium such as bananas, no avocado.  --If your knee pain is no longer responding to Tylenol please call us.  --Follow up with [place] early next week.  --If you have

## 2011-07-27 NOTE — Assessment & Plan Note (Signed)
Stable, pt denies SI/HI. Pt continues to take bupropion.

## 2011-07-27 NOTE — Assessment & Plan Note (Signed)
Pt with orthostatic hypotension today.   --Instructed pt to not take Lasix until Monday and decrease the dose to 40mg  once daily.   --Instructed patient to decrease Norvasc to 5mg  once daily.   --Encouraged pt to drink fluids liberally

## 2011-07-27 NOTE — Assessment & Plan Note (Addendum)
Pt with no SOB, no cp, no cardiac rub today.

## 2011-07-28 LAB — URINALYSIS, ROUTINE W REFLEX MICROSCOPIC
Bilirubin Urine: NEGATIVE
Glucose, UA: NEGATIVE mg/dL
Hgb urine dipstick: NEGATIVE
Ketones, ur: NEGATIVE mg/dL
Leukocytes, UA: NEGATIVE
Nitrite: NEGATIVE
Protein, ur: NEGATIVE mg/dL
Specific Gravity, Urine: 1.017 (ref 1.005–1.030)
Urobilinogen, UA: 0.2 mg/dL (ref 0.0–1.0)
pH: 5 (ref 5.0–8.0)

## 2011-07-30 NOTE — Progress Notes (Signed)
Agree with plan 

## 2011-08-01 ENCOUNTER — Encounter: Payer: PRIVATE HEALTH INSURANCE | Admitting: Internal Medicine

## 2011-08-01 ENCOUNTER — Ambulatory Visit (INDEPENDENT_AMBULATORY_CARE_PROVIDER_SITE_OTHER): Payer: PRIVATE HEALTH INSURANCE | Admitting: Internal Medicine

## 2011-08-01 ENCOUNTER — Encounter: Payer: Self-pay | Admitting: Internal Medicine

## 2011-08-01 VITALS — BP 157/99 | HR 87 | Temp 98.7°F | Ht 62.0 in | Wt 177.7 lb

## 2011-08-01 DIAGNOSIS — I951 Orthostatic hypotension: Secondary | ICD-10-CM

## 2011-08-01 DIAGNOSIS — I10 Essential (primary) hypertension: Secondary | ICD-10-CM

## 2011-08-01 DIAGNOSIS — N390 Urinary tract infection, site not specified: Secondary | ICD-10-CM

## 2011-08-01 DIAGNOSIS — G4700 Insomnia, unspecified: Secondary | ICD-10-CM

## 2011-08-01 DIAGNOSIS — R42 Dizziness and giddiness: Secondary | ICD-10-CM

## 2011-08-01 MED ORDER — MECLIZINE HCL 25 MG PO TABS: 25.0000 mg | ORAL_TABLET | Freq: Three times a day (TID) | ORAL | Status: AC | PRN

## 2011-08-01 NOTE — Patient Instructions (Signed)
--  Start taking your BP medications as you were taking them before, Norvasc 10mg , Lasix 80mg .  --Continue drinking fluids.  --Start taking meclizine 25mg  three times per day as needed for dizziness.  --Follow up with your Ultrasound --Follow up with [place] in one to two weeks, preferably after you have your ultrasound.

## 2011-08-02 NOTE — Progress Notes (Signed)
INTERNAL MEDICINE TEACHING SERVICE Attending Note  Date: 08/02/2011  Patient name: [name]  Medical record number: [id number]  Date of birth: 15-Oct-1934   This patient has been seen and discussed with Dr. Hayes Ludwig. Please see her note for complete details. I concur with her findings, assessment, and plan.   Dominic Pea, DO  08/02/2011, 2:44 PM

## 2011-08-03 ENCOUNTER — Ambulatory Visit (HOSPITAL_COMMUNITY)
Admission: RE | Admit: 2011-08-03 | Discharge: 2011-08-03 | Disposition: A | Discharge status: Home / Self Care | Payer: PRIVATE HEALTH INSURANCE | Source: Ambulatory Visit | Attending: Internal Medicine | Admitting: Internal Medicine

## 2011-08-03 DIAGNOSIS — R42 Dizziness and giddiness: Secondary | ICD-10-CM

## 2011-08-03 NOTE — Progress Notes (Signed)
*  PRELIMINARY RESULTS* Vascular Ultrasound Carotid Duplex (Doppler) has been completed.  Preliminary findings: Bilaterally no significant ICA stenosis with antegrade vertebral flow.  EUNICE, Jaculin Rasmus RDMS, RVT 08/03/2011, 10:08 AM

## 2011-08-04 DIAGNOSIS — G4700 Insomnia, unspecified: Secondary | ICD-10-CM | POA: Insufficient documentation

## 2011-08-04 NOTE — Assessment & Plan Note (Signed)
Resolved. Most recent UA negative for leukocytes and nitrites. Pt remains asymptomatic.

## 2011-08-04 NOTE — Assessment & Plan Note (Addendum)
Pt with persistent dizziness now for almost two weeks. Dizziness is present with sitting, worse with moving head down, side to side, or moving her eyes sided to side. Pt became nauseous with horizontal gaze from sided to side and declined Dix-Hallpike test. Pt reports having this problem before, months ago, that responded to Grace. This is most likely benign positional paroxymal vertigo. Pt also declined Epley and Semont maneuvers but might be willing to try if dizziness persist. Will order carotid dopplers to r/o carotid dz/plaque before attempting maneuvers and to r/o dizziness 2/2 carotid artery stenosis/occlusion.  --Ordered carotid artery dopplers.  --Pt to continue taking meclizine  25 mg TID PRN for dizziness.   Addendum: Carotid artery dopplers: Summary: No significant extracranial carotid artery stenosis demonstrated. Vertebrals are patent with antegrade flow

## 2011-08-04 NOTE — Assessment & Plan Note (Addendum)
Pt's anti-hypertensive meds were held and reduced 2/2 to dizziness, however, pt has persistent dizziness. Pt hypertensive on 1/4 dose of Lasix and 1/2 dose of amolidipine. Pt not orthostatic today. Will resume previous antihypertensives.  --Continue Lasix 80 mg BID --Continue amlodipine 10mg  daily.

## 2011-08-04 NOTE — Assessment & Plan Note (Addendum)
Pt not orthostatic today. Now hypertensive. Resumed anti-hypertensive meds.

## 2011-08-04 NOTE — Assessment & Plan Note (Signed)
Pt called after visit to request medication for sleep. Pt is already instructed to take meclizine 25mg  TID PRN for dizziness presumed 2/2 vertigo. Given that this medication is already sedating, will refrain from adding a sleep aid at this time. Pt was instructed to take one of the TID doses of meclizine 30 minutes before going to bed. She was instructed to follow up if her insomnia persisted despite meclizine. Pt understood and agreed with plan.

## 2011-08-04 NOTE — Progress Notes (Signed)
  Subjective:    Patient ID: [name], female    DOB: 06-07-1934, 77 y.o.   MRN: [id number]  HPI  [name] is a 77 year-old woman with PMH significant for CKD stage IV, remote hx of perdicarditis, and depression who comes in for follow up from her last visit for evaluation of dizziness. During her last visit she was instructed to drink fluids liberally, to hold Lasix for the weekend and start taking 1/4 of her dose on Monday, and to decreased her Norvasc dose by half. She followed her instructions faithfully with no improvement of her symptoms.   Today she returns with continued dizziness that now occurs while she is sitting or standing, and worse when she moves her head from side to side or downward. She denies the room spinning, blurry vision, syncope, headache, chest pain, or shortness of breath.   In addition, she called later, after her visit to request a medication to help her sleep as she continues to have difficulty falling asleep despite improvement of her other depression symptoms.    Review of Systems  Constitutional: Negative for fever, chills, diaphoresis and appetite change.  HENT: Negative for ear pain, congestion and ear discharge.   Respiratory: Negative for cough and shortness of breath.   Cardiovascular: Negative for chest pain.  Gastrointestinal: Negative for nausea, vomiting and diarrhea.  Genitourinary: Negative for difficulty urinating.  Musculoskeletal:       Chronicright knee pain  Skin: Negative for color change.  Neurological: Positive for dizziness. Negative for tremors, facial asymmetry, speech difficulty, weakness, light-headedness, numbness and headaches.  Psychiatric/Behavioral: Positive for disturbed wake/sleep cycle. Negative for agitation.       Objective:   Physical Exam  Constitutional: She is oriented to person, place, and time. She appears well-developed and well-nourished. No distress.  HENT:  Head: Normocephalic and atraumatic.  Eyes:  Conjunctivae and EOM are normal. Pupils are equal, round, and reactive to light. Right eye exhibits no discharge. Left eye exhibits no discharge. No scleral icterus.       Pt with worse dizziness upon lateral gaze from side to side.   Cardiovascular: Normal rate, regular rhythm, normal heart sounds and intact distal pulses.  Exam reveals no gallop and no friction rub.   No murmur heard. Pulmonary/Chest: Effort normal and breath sounds normal. No respiratory distress. She has no wheezes. She has no rales. She exhibits no tenderness.  Musculoskeletal: Normal range of motion. She exhibits edema. She exhibits no tenderness.       Trace pretibial edema  Neurological: She is alert and oriented to person, place, and time. No cranial nerve deficit.  Skin: Skin is warm and dry. She is not diaphoretic. No erythema.  Psychiatric: She has a normal mood and affect. Her behavior is normal.          Assessment & Plan:

## 2011-08-15 ENCOUNTER — Ambulatory Visit (INDEPENDENT_AMBULATORY_CARE_PROVIDER_SITE_OTHER): Payer: PRIVATE HEALTH INSURANCE | Admitting: Internal Medicine

## 2011-08-15 ENCOUNTER — Encounter: Payer: Self-pay | Admitting: Internal Medicine

## 2011-08-15 ENCOUNTER — Encounter: Payer: PRIVATE HEALTH INSURANCE | Admitting: Internal Medicine

## 2011-08-15 VITALS — BP 140/88 | HR 66 | Temp 97.5°F | Ht 62.0 in | Wt 177.5 lb

## 2011-08-15 DIAGNOSIS — N19 Unspecified kidney failure: Secondary | ICD-10-CM

## 2011-08-15 DIAGNOSIS — I10 Essential (primary) hypertension: Secondary | ICD-10-CM

## 2011-08-15 DIAGNOSIS — G4700 Insomnia, unspecified: Secondary | ICD-10-CM

## 2011-08-15 MED ORDER — AMLODIPINE BESYLATE 10 MG PO TABS: 10.0000 mg | ORAL_TABLET | Freq: Every day | ORAL | Status: DC

## 2011-08-15 MED ORDER — MELATONIN 1 MG PO TABS: 1.0000 mg | ORAL_TABLET | Freq: Every evening | ORAL | Status: DC | PRN

## 2011-08-15 NOTE — Assessment & Plan Note (Signed)
Difficulty falling sleep. She was told to use Meclizine for her dizziness, which may help with her insomnia. She states that Meclizine did not help with her insomnia.   - will try low dose Melatonin 1 mg po daily at bedtime PRN, may repeat x 1

## 2011-08-15 NOTE — Assessment & Plan Note (Signed)
Recent BMP in July 2013 indicates CKD stage IV, which is her baseline.  She denies any discomfort. No appetite or weight change, no body swelling or edema. She continues to follow up with her Nephrologist with next appt in one month.   - will not recheck her renal function since it was at her baseline a couple of weeks ago. - instruct her to continue to follow up with her Nephrologist.

## 2011-08-15 NOTE — Addendum Note (Signed)
Addended byNicoletta Dress, Malania Gawthrop on: 08/15/2011 12:12 PM   Modules accepted: Level of Service

## 2011-08-15 NOTE — Progress Notes (Signed)
Subjective:   Patient ID: [name] female   DOB: [birth year] 77 y.o.   MRN: [id number]  HPI: [name] is a 77 y.o. woman with PMH of CKD stage IV, HTN, remote hx of perdicarditis, and depression who comes in for follow up from her last visit for evaluation of dizziness.   She is doing ok. States that she has been drinking enough fluids during the day and separate her Lasix to 1/2 BID instead of taking one tablet daily, which makes a huge difference for her. She no longer feels dizziness, does not require Meclizine.   With regards to her insomnia, she continues to have difficulty in falling into sleep. She would like to have some medications for her insomnia. With regard to her CKD, she continues to see her Nephrologist Dr. Real Cons borough. Her next appt with her will be at next month.  Past Medical History  Diagnosis Date  . Depression   . Hypertension   . Renal failure     Formerly on hemodialysis; managed by Dr. Corliss Parish  . Gastritis, Helicobacter pylori     Noted on EGD 01/18/2009 by [name];  biopsy showed chronic gastritis with Helicobacter pylori, treated by [name].  . Pericardial effusion 11/2004    S/P subxiphoid pericardial window 11/01/2004 by [name].  A question of possible collagen vascular disease had been raised in 2006 due to arthralgias, a history of idiopathic pericarditis, and increased pigmentation of her palms, and she was referred to [name] but he was unable to confirm a rheumatologic diagnosis.  [name] Hemorrhoids, internal 05/09/2005     Found on colonoscopy 05/09/2005 by [name]  . Cataracts, bilateral   . Pinguecula   . Vitreous detachment   . Thigh pain   . Epicondylitis   . Anemia, iron deficiency   . Angiodysplasia of colon 05/09/2005    Single small angiodysplastic lesion in the descending colon found on colonoscopy 05/09/2005 by [name]  . Anemia due to blood loss, chronic   .  Stiffness of joint, not elsewhere classified, hand   . S/P hysterectomy    Current Outpatient Prescriptions  Medication Sig Dispense Refill  . amLODipine (NORVASC) 10 MG tablet Take 1 tablet (10 mg total) by mouth at bedtime.  30 tablet  11  . buPROPion (WELLBUTRIN XL) 150 MG 24 hr tablet TAKE ONE TABLET BY MOUTH EVERY DAY IN THE MORNING  30 tablet  2  . cinacalcet (SENSIPAR) 30 MG tablet Take 30 mg by mouth every other day. With meals.      . furosemide (LASIX) 80 MG tablet Take 80 mg by mouth 2 (two) times daily.       [name] Ketotifen Fumarate (ALAWAY OP) Place 1 drop into both eyes 2 (two) times daily at 10 AM and 5 PM.      . loteprednol (LOTEMAX) 0.2 % SUSP Place 1 drop into both eyes 3 (three) times daily.      . Multiple Vitamins-Minerals (CENTRUM SILVER PO) Take 1 tablet by mouth daily.        [name] omeprazole (PRILOSEC) 20 MG capsule Take 1 capsule (20 mg total) by mouth 2 (two) times daily.  60 capsule  11  . ondansetron (ZOFRAN) 4 MG tablet Take 4 mg by mouth every 6 (six) hours as needed.      [name] DISCONTD: FLUoxetine (PROZAC) 10 MG tablet Take 1 tablet (10 mg total) by mouth daily.  30 tablet  2   Family History  Problem Relation Age of Onset  . Hypertension Brother   . Heart disease Father   . Diabetes Brother   . Breast cancer Neg Hx   . Colon cancer Neg Hx   . Lung cancer Neg Hx   . Cervical cancer Neg Hx   . Sickle cell anemia Neg Hx    History   Social History  . Marital Status: Legally Separated    Spouse Name: N/A    Number of Children: N/A  . Years of Education: N/A   Social History Main Topics  . Smoking status: Former Smoker -- 0.1 packs/day for 1 years    Quit date: 01/02/1960  . Smokeless tobacco: Never Used  . Alcohol Use: No  . Drug Use: No  . Sexually Active: None   Other Topics Concern  . None   Social History Narrative  . None   Review of Systems: Review of Systems:  Constitutional:  Denies fever, chills, diaphoresis, appetite change and fatigue.    HEENT:  Denies congestion, sore throat, rhinorrhea, sneezing, mouth sores, trouble swallowing, neck pain   Respiratory:  Denies SOB, DOE, cough, and wheezing.   Cardiovascular:  Denies palpitations and leg swelling.   Gastrointestinal:  Denies nausea, vomiting, abdominal pain, diarrhea, constipation, blood in stool and abdominal distention.   Genitourinary:  Denies dysuria, urgency, frequency, hematuria, flank pain and difficulty urinating.   Musculoskeletal:  Denies myalgias, back pain, joint swelling, arthralgias and gait problem.   Skin:  Denies pallor, rash and wound.   Neurological:  Denies dizziness, seizures, syncope, weakness, light-headedness, numbness and headaches.    .   Objective:  Physical Exam: Filed Vitals:   08/15/11 0827  BP: 140/88  Pulse: 66  Temp: 97.5 F (36.4 C)  TempSrc: Oral  Height: 5\' 2"  (1.575 m)  Weight: 177 lb 8 oz (80.513 kg)   General: alert, well-developed, and cooperative to examination.  Head: normocephalic and atraumatic.  Eyes: vision grossly intact, pupils equal, pupils round, pupils reactive to light, no injection and anicteric.  Mouth: pharynx pink and moist, no erythema, and no exudates.  Neck: supple, full ROM, no thyromegaly, no JVD, and no carotid bruits.  Lungs: normal respiratory effort, no accessory muscle use, normal breath sounds, no crackles, and no wheezes. Heart: normal rate, regular rhythm, no murmur, no gallop, and no rub.  Abdomen: soft, non-tender, normal bowel sounds, no distention, no guarding, no rebound tenderness, no hepatomegaly, and no splenomegaly.  Msk: no joint swelling, no joint warmth, and no redness over joints.  Pulses: 2+ DP/PT pulses bilaterally Extremities: No cyanosis, clubbing, edema Neurologic: alert & oriented X3, cranial nerves II-XII intact, strength normal in all extremities, sensation intact to light touch, and gait normal.  Skin: turgor normal and no rashes.  Psych: Oriented X3, memory intact for  recent and remote, normally interactive, good eye contact, not anxious appearing, and not depressed appearing.   Assessment & Plan:

## 2011-08-15 NOTE — Assessment & Plan Note (Signed)
Patient reports medical compliance with her antihypertensive regimen. She checks her BP at home with reading at 120-130/70's.  But she forgot to take her med last night after she watched TV before bedtime. Her BP is 140/88 at the clinic today. Denies discomfort.  - will continue the current regimen.

## 2011-08-15 NOTE — Patient Instructions (Addendum)
1. Continue to take your medications as instructed 2  try Melatonin 1 mg po at bedtime as needed, may repeat x 1 dose 3. Please call me on Friday to report to my nurse whether your insomnia is better.  4. Follow up in 3 months

## 2011-08-17 ENCOUNTER — Encounter (HOSPITAL_COMMUNITY): Payer: Self-pay

## 2011-08-17 ENCOUNTER — Emergency Department (INDEPENDENT_AMBULATORY_CARE_PROVIDER_SITE_OTHER)
Admission: EM | Admit: 2011-08-17 | Discharge: 2011-08-17 | Disposition: A | Discharge status: Home / Self Care | Payer: PRIVATE HEALTH INSURANCE | Source: Home / Self Care

## 2011-08-17 ENCOUNTER — Emergency Department (INDEPENDENT_AMBULATORY_CARE_PROVIDER_SITE_OTHER): Payer: PRIVATE HEALTH INSURANCE

## 2011-08-17 DIAGNOSIS — M79609 Pain in unspecified limb: Secondary | ICD-10-CM

## 2011-08-17 DIAGNOSIS — M79674 Pain in right toe(s): Secondary | ICD-10-CM

## 2011-08-17 MED ORDER — CELECOXIB 200 MG PO CAPS: 200.0000 mg | ORAL_CAPSULE | Freq: Two times a day (BID) | ORAL | Status: DC

## 2011-08-17 MED ORDER — HYDROCODONE-ACETAMINOPHEN 5-325 MG PO TABS
ORAL_TABLET | ORAL | Status: AC
  Filled 2011-08-17: qty 1

## 2011-08-17 MED ORDER — HYDROCODONE-ACETAMINOPHEN 5-325 MG PO TABS
1.0000 | ORAL_TABLET | Freq: Once | ORAL | Status: AC
  Administered 2011-08-17: 1 via ORAL

## 2011-08-17 MED ORDER — TRAMADOL HCL 50 MG PO TABS: 50.0000 mg | ORAL_TABLET | Freq: Four times a day (QID) | ORAL | Status: DC | PRN

## 2011-08-17 MED ORDER — IBUPROFEN 600 MG PO TABS: 600.0000 mg | ORAL_TABLET | Freq: Once | ORAL | Status: DC

## 2011-08-17 MED ORDER — TRAMADOL HCL 50 MG PO TABS: 50.0000 mg | ORAL_TABLET | Freq: Four times a day (QID) | ORAL | Status: AC | PRN

## 2011-08-17 MED ORDER — IBUPROFEN 800 MG PO TABS: 800.0000 mg | ORAL_TABLET | Freq: Once | ORAL | Status: DC

## 2011-08-17 NOTE — ED Notes (Signed)
Pt states she hit her rt foot on something moving furniture 2 days ago.  C/o swelling and pain to rt great toe and rt 2nd toes.

## 2011-08-17 NOTE — ED Provider Notes (Signed)
History     CSN: [id number]  Arrival date & time 08/17/11  1651   None     Chief Complaint  Patient presents with  . Foot Injury    (Consider location/radiation/quality/duration/timing/severity/associated sxs/prior treatment) The history is provided by the patient.  [name] is a 77 y.o. female who sustained a right second toe injury 2 days ago. Mechanism of injury: hit foot on furniture. Immediate symptoms: pain, increasingly worse over the past two days.  No prior history of related problems.  Has taken Advil with minimal relief.   Past Medical History  Diagnosis Date  . Depression   . Hypertension   . Renal failure     Formerly on hemodialysis; managed by Dr. Corliss Parish  . Gastritis, Helicobacter pylori     Noted on EGD 01/18/2009 by [name];  biopsy showed chronic gastritis with Helicobacter pylori, treated by [name].  . Pericardial effusion 11/2004    S/P subxiphoid pericardial window 11/01/2004 by [name].  A question of possible collagen vascular disease had been raised in 2006 due to arthralgias, a history of idiopathic pericarditis, and increased pigmentation of her palms, and she was referred to [name] but he was unable to confirm a rheumatologic diagnosis.  [name] Hemorrhoids, internal 05/09/2005     Found on colonoscopy 05/09/2005 by [name]  . Cataracts, bilateral   . Pinguecula   . Vitreous detachment   . Thigh pain   . Epicondylitis   . Anemia, iron deficiency   . Angiodysplasia of colon 05/09/2005    Single small angiodysplastic lesion in the descending colon found on colonoscopy 05/09/2005 by [name]  . Anemia due to blood loss, chronic   . Stiffness of joint, not elsewhere classified, hand   . S/P hysterectomy     Past Surgical History  Procedure Date  . Subxyphoid pericardial window 11/01/2004  . Arteriovenous graft placement 11/28/2005     Left forearm arteriovenous graft.  . Thrombectomy  / arteriovenous graft revision 01/21/2006    Thrombectomy and revision of left forearm loop AV graft.  . Thrombectomy  03/06/2006    Simple thrombectomy arteriovenous Gore-Tex graft, left arm, with exploration of venous end and intraoperative shuntogram.           Family History  Problem Relation Age of Onset  . Hypertension Brother   . Heart disease Father   . Diabetes Brother   . Breast cancer Neg Hx   . Colon cancer Neg Hx   . Lung cancer Neg Hx   . Cervical cancer Neg Hx   . Sickle cell anemia Neg Hx     History  Substance Use Topics  . Smoking status: Former Smoker -- 0.1 packs/day for 1 years    Quit date: 01/02/1960  . Smokeless tobacco: Never Used  . Alcohol Use: No    OB History    Grav Para Term Preterm Abortions TAB SAB Ect Mult Living                  Review of Systems  Respiratory: Negative.   Cardiovascular: Negative.   Musculoskeletal: Positive for joint swelling and gait problem. Negative for myalgias, back pain and arthralgias.  Skin: Negative.     Allergies  Review of patient's allergies indicates no known allergies.  Home Medications   Current Outpatient Rx  Name Route Sig Dispense Refill  . AMLODIPINE BESYLATE 10 MG PO TABS Oral Take 1 tablet (10 mg total)  by mouth at bedtime. 30 tablet 11  . RENA-VITE PO Oral Take by mouth.    [name] CALCIUM ACETATE (PHOS BINDER) 667 MG/5ML PO SOLN Oral Take by mouth 3 (three) times daily with meals.    [name] CINACALCET HCL 30 MG PO TABS Oral Take 30 mg by mouth every other day. With meals.    . FUROSEMIDE 80 MG PO TABS Oral Take 40 mg by mouth daily.     [name] OMEPRAZOLE 20 MG PO CPDR Oral Take 1 capsule (20 mg total) by mouth 2 (two) times daily. 60 capsule 11  . BUPROPION HCL ER (XL) 150 MG PO TB24  TAKE ONE TABLET BY MOUTH EVERY DAY IN THE MORNING 30 tablet 2  . ALAWAY OP Both Eyes Place 1 drop into both eyes 2 (two) times daily at 10 AM and 5 PM.    . LOTEPREDNOL ETABONATE 0.2 % OP SUSP Both Eyes Place 1 drop into  both eyes 3 (three) times daily.    [name] MELATONIN 1 MG PO TABS Oral Take 1 tablet (1 mg total) by mouth at bedtime as needed (may repeat x 1 . ). 60 tablet 1  . CENTRUM SILVER PO Oral Take 1 tablet by mouth daily.      [name] ONDANSETRON HCL 4 MG PO TABS Oral Take 4 mg by mouth every 6 (six) hours as needed.    [name] TRAMADOL HCL 50 MG PO TABS Oral Take 1 tablet (50 mg total) by mouth every 6 (six) hours as needed for pain. 30 tablet 1    BP 159/81  Pulse 70  Temp 97.8 F (36.6 C) (Oral)  Resp 20  SpO2 100%  Physical Exam  Nursing note and vitals reviewed. Constitutional: She is oriented to person, place, and time. Vital signs are normal. She appears well-developed and well-nourished. She is active and cooperative.  HENT:  Head: Normocephalic.  Eyes: Conjunctivae are normal. Pupils are equal, round, and reactive to light. No scleral icterus.  Neck: Trachea normal. Neck supple.  Cardiovascular: Normal rate, regular rhythm and intact distal pulses.   Pulmonary/Chest: Effort normal and breath sounds normal.  Musculoskeletal: She exhibits tenderness.       Right ankle: Normal. Achilles tendon normal.       Left ankle: Normal. Achilles tendon normal.       Right foot: She exhibits tenderness and swelling. She exhibits normal range of motion, no bony tenderness, normal capillary refill, no crepitus and no deformity.       Left foot: Normal.       Feet:  Neurological: She is alert and oriented to person, place, and time. She has normal strength. No cranial nerve deficit or sensory deficit. Gait abnormal. Coordination normal. GCS eye subscore is 4. GCS verbal subscore is 5. GCS motor subscore is 6.       limping  Skin: Skin is warm, dry and intact.  Psychiatric: She has a normal mood and affect. Her speech is normal and behavior is normal. Judgment and thought content normal. Cognition and memory are normal.    ED Course  Procedures (including critical care time)  Labs Reviewed - No data to  display Dg Foot Complete Right  08/17/2011  *RADIOLOGY REPORT*  Clinical Data: Foot injury.  Pain.  RIGHT FOOT COMPLETE - 3+ VIEW  Comparison: None  Findings: Early degenerative changes in the first MTP joint. No acute bony abnormality.  Specifically, no fracture, subluxation, or dislocation.  Soft tissues are intact.  IMPRESSION: No acute  bony abnormality.  Original Report Authenticated By: Raelyn Number, M.D.     1. Toe pain, right       MDM  Buddy tape and post op shoe in office.  Tylenol as needed for pain, ultram for worsening pain.  Use your cane at home as needed.       Awilda Metro, NP 08/17/11 1916

## 2011-08-18 NOTE — ED Provider Notes (Signed)
Medical screening examination/treatment/procedure(s) were performed by non-physician practitioner and as supervising physician I was immediately available for consultation/collaboration.   [facility]; MD   Randa Spike, MD 08/18/11 1001

## 2011-10-03 ENCOUNTER — Other Ambulatory Visit: Payer: Self-pay | Admitting: *Deleted

## 2011-10-03 MED ORDER — OMEPRAZOLE 20 MG PO CPDR: 20.0000 mg | DELAYED_RELEASE_CAPSULE | Freq: Two times a day (BID) | ORAL | Status: DC

## 2011-10-04 ENCOUNTER — Other Ambulatory Visit: Payer: Self-pay | Admitting: Podiatry

## 2011-10-04 DIAGNOSIS — IMO0002 Reserved for concepts with insufficient information to code with codable children: Secondary | ICD-10-CM

## 2011-10-12 ENCOUNTER — Other Ambulatory Visit: Payer: PRIVATE HEALTH INSURANCE

## 2011-10-18 ENCOUNTER — Other Ambulatory Visit: Payer: Self-pay | Admitting: Internal Medicine

## 2011-11-11 ENCOUNTER — Encounter (HOSPITAL_COMMUNITY): Payer: Self-pay | Admitting: Emergency Medicine

## 2011-11-11 ENCOUNTER — Emergency Department (HOSPITAL_COMMUNITY)
Admission: EM | Admit: 2011-11-11 | Discharge: 2011-11-11 | Disposition: A | Discharge status: Home / Self Care | Payer: PRIVATE HEALTH INSURANCE | Attending: Emergency Medicine | Admitting: Emergency Medicine

## 2011-11-11 DIAGNOSIS — Z8739 Personal history of other diseases of the musculoskeletal system and connective tissue: Secondary | ICD-10-CM | POA: Insufficient documentation

## 2011-11-11 DIAGNOSIS — I10 Essential (primary) hypertension: Secondary | ICD-10-CM | POA: Insufficient documentation

## 2011-11-11 DIAGNOSIS — Z79899 Other long term (current) drug therapy: Secondary | ICD-10-CM | POA: Insufficient documentation

## 2011-11-11 DIAGNOSIS — Z862 Personal history of diseases of the blood and blood-forming organs and certain disorders involving the immune mechanism: Secondary | ICD-10-CM | POA: Insufficient documentation

## 2011-11-11 DIAGNOSIS — Z8719 Personal history of other diseases of the digestive system: Secondary | ICD-10-CM | POA: Insufficient documentation

## 2011-11-11 DIAGNOSIS — Z8669 Personal history of other diseases of the nervous system and sense organs: Secondary | ICD-10-CM | POA: Insufficient documentation

## 2011-11-11 DIAGNOSIS — Z87891 Personal history of nicotine dependence: Secondary | ICD-10-CM | POA: Insufficient documentation

## 2011-11-11 DIAGNOSIS — Z90710 Acquired absence of both cervix and uterus: Secondary | ICD-10-CM | POA: Insufficient documentation

## 2011-11-11 DIAGNOSIS — R42 Dizziness and giddiness: Secondary | ICD-10-CM | POA: Insufficient documentation

## 2011-11-11 DIAGNOSIS — Z8679 Personal history of other diseases of the circulatory system: Secondary | ICD-10-CM | POA: Insufficient documentation

## 2011-11-11 DIAGNOSIS — F329 Major depressive disorder, single episode, unspecified: Secondary | ICD-10-CM | POA: Insufficient documentation

## 2011-11-11 DIAGNOSIS — Z87448 Personal history of other diseases of urinary system: Secondary | ICD-10-CM | POA: Insufficient documentation

## 2011-11-11 DIAGNOSIS — F3289 Other specified depressive episodes: Secondary | ICD-10-CM | POA: Insufficient documentation

## 2011-11-11 DIAGNOSIS — A048 Other specified bacterial intestinal infections: Secondary | ICD-10-CM | POA: Insufficient documentation

## 2011-11-11 LAB — BASIC METABOLIC PANEL
BUN: 56 mg/dL — ABNORMAL HIGH (ref 6–23)
CO2: 24 mEq/L (ref 19–32)
Calcium: 9.1 mg/dL (ref 8.4–10.5)
Chloride: 104 mEq/L (ref 96–112)
Creatinine, Ser: 2.89 mg/dL — ABNORMAL HIGH (ref 0.50–1.10)
GFR calc Af Amer: 17 mL/min — ABNORMAL LOW (ref >90)
GFR calc non Af Amer: 15 mL/min — ABNORMAL LOW (ref >90)
Glucose, Bld: 85 mg/dL (ref 70–99)
Potassium: 3.9 mEq/L (ref 3.5–5.1)
Sodium: 140 mEq/L (ref 135–145)

## 2011-11-11 MED ORDER — LORAZEPAM 2 MG/ML IJ SOLN
1.0000 mg | Freq: Once | INTRAMUSCULAR | Status: AC
  Administered 2011-11-11: 1 mg via INTRAVENOUS
  Filled 2011-11-11: qty 1

## 2011-11-11 MED ORDER — MECLIZINE HCL 25 MG PO TABS: 25.0000 mg | ORAL_TABLET | Freq: Four times a day (QID) | ORAL | Status: DC | PRN

## 2011-11-11 MED ORDER — MECLIZINE HCL 25 MG PO TABS
25.0000 mg | ORAL_TABLET | Freq: Once | ORAL | Status: AC
  Administered 2011-11-11: 25 mg via ORAL
  Filled 2011-11-11: qty 1

## 2011-11-11 MED ORDER — LORAZEPAM 1 MG PO TABS: 1.0000 mg | ORAL_TABLET | Freq: Three times a day (TID) | ORAL | Status: DC | PRN

## 2011-11-11 NOTE — ED Provider Notes (Signed)
History     CSN: [id number]  Arrival date & time 11/11/11  1104   First MD Initiated Contact with Patient 11/11/11 1144      Chief Complaint  Patient presents with  . Dizziness    (Consider location/radiation/quality/duration/timing/severity/associated sxs/prior treatment) The history is provided by the patient.   patient here with dizziness that started yesterday that is similar to her vertigo in the past although not as severe. Describes that the room is spinning and worse with certain movements of her head. Denies any new peripheral weakness. No vomiting or fever. Denies any ear pain. Use her Antivert with minimal relief. Denies any ataxia.  Past Medical History  Diagnosis Date  . Depression   . Hypertension   . Renal failure     Formerly on hemodialysis; managed by Dr. Corliss Parish  . Gastritis, Helicobacter pylori     Noted on EGD 01/18/2009 by [name];  biopsy showed chronic gastritis with Helicobacter pylori, treated by [name].  . Pericardial effusion 11/2004    S/P subxiphoid pericardial window 11/01/2004 by [name].  A question of possible collagen vascular disease had been raised in 2006 due to arthralgias, a history of idiopathic pericarditis, and increased pigmentation of her palms, and she was referred to [name] but he was unable to confirm a rheumatologic diagnosis.  [name] Hemorrhoids, internal 05/09/2005     Found on colonoscopy 05/09/2005 by [name]  . Cataracts, bilateral   . Pinguecula   . Vitreous detachment   . Thigh pain   . Epicondylitis   . Anemia, iron deficiency   . Angiodysplasia of colon 05/09/2005    Single small angiodysplastic lesion in the descending colon found on colonoscopy 05/09/2005 by [name]  . Anemia due to blood loss, chronic   . Stiffness of joint, not elsewhere classified, hand   . S/P hysterectomy     Past Surgical History  Procedure Date  . Subxyphoid pericardial window  11/01/2004  . Arteriovenous graft placement 11/28/2005     Left forearm arteriovenous graft.  . Thrombectomy / arteriovenous graft revision 01/21/2006    Thrombectomy and revision of left forearm loop AV graft.  . Thrombectomy  03/06/2006    Simple thrombectomy arteriovenous Gore-Tex graft, left arm, with exploration of venous end and intraoperative shuntogram.           Family History  Problem Relation Age of Onset  . Hypertension Brother   . Heart disease Father   . Diabetes Brother   . Breast cancer Neg Hx   . Colon cancer Neg Hx   . Lung cancer Neg Hx   . Cervical cancer Neg Hx   . Sickle cell anemia Neg Hx     History  Substance Use Topics  . Smoking status: Former Smoker -- 0.1 packs/day for 1 years    Quit date: 01/02/1960  . Smokeless tobacco: Never Used  . Alcohol Use: No    OB History    Grav Para Term Preterm Abortions TAB SAB Ect Mult Living                  Review of Systems  All other systems reviewed and are negative.    Allergies  Review of patient's allergies indicates no known allergies.  Home Medications   Current Outpatient Rx  Name  Route  Sig  Dispense  Refill  . AMLODIPINE BESYLATE 10 MG PO TABS   Oral   Take 10 mg by  mouth at bedtime.         . ATENOLOL 25 MG PO TABS   Oral   Take 25 mg by mouth daily.         . BUPROPION HCL ER (XL) 150 MG PO TB24   Oral   Take 150 mg by mouth daily.         [name] CINACALCET HCL 30 MG PO TABS   Oral   Take 30 mg by mouth every other day. With meals.         . FUROSEMIDE 80 MG PO TABS   Oral   Take 40 mg by mouth daily.          [name] LATANOPROST 0.005 % OP SOLN   Both Eyes   Place 1 drop into both eyes at bedtime.         [name] MECLIZINE HCL 25 MG PO TABS   Oral   Take 25 mg by mouth 3 (three) times daily as needed.         . CENTRUM SILVER PO   Oral   Take 1 tablet by mouth daily.           [name] OMEPRAZOLE 20 MG PO CPDR   Oral   Take 20 mg by mouth 2 (two) times daily.           . SENNA 8.6 MG PO TABS   Oral   Take 1 tablet by mouth daily as needed.           BP 137/77  Pulse 52  Temp 98.9 F (37.2 C) (Oral)  Resp 18  Wt 165 lb (74.844 kg)  SpO2 100%  Physical Exam  Nursing note and vitals reviewed. Constitutional: She is oriented to person, place, and time. She appears well-developed and well-nourished.  Non-toxic appearance. No distress.  HENT:  Head: Normocephalic and atraumatic.  Eyes: Conjunctivae normal, EOM and lids are normal. Pupils are equal, round, and reactive to light.  Neck: Normal range of motion. Neck supple. No tracheal deviation present. No mass present.  Cardiovascular: Normal rate, regular rhythm and normal heart sounds.  Exam reveals no gallop.   No murmur heard. Pulmonary/Chest: Effort normal and breath sounds normal. No stridor. No respiratory distress. She has no decreased breath sounds. She has no wheezes. She has no rhonchi. She has no rales.  Abdominal: Soft. Normal appearance and bowel sounds are normal. She exhibits no distension. There is no tenderness. There is no rebound and no CVA tenderness.  Musculoskeletal: Normal range of motion. She exhibits no edema and no tenderness.  Neurological: She is alert and oriented to person, place, and time. She has normal strength. She displays no tremor. No cranial nerve deficit or sensory deficit. Coordination normal. GCS eye subscore is 4. GCS verbal subscore is 5. GCS motor subscore is 6.       Nystagmus horizontal  Skin: Skin is warm and dry. No abrasion and no rash noted.  Psychiatric: She has a normal mood and affect. Her speech is normal and behavior is normal.    ED Course  Procedures (including critical care time)   Labs Reviewed  BASIC METABOLIC PANEL   No results found.   No diagnosis found.    MDM   Patient given medications for vertigo and she feels better now. Repeat neurological exam at time of discharge stable. No concern for a central vertigo. Patient's  gait is normal.        Leota Jacobsen, MD  11/11/11 1432 

## 2011-11-11 NOTE — ED Notes (Signed)
Patient states that she was dizzy all day yesterday, the patient reports that when she woke up this am that she was still dizzy. Denies any N/V

## 2011-11-20 ENCOUNTER — Ambulatory Visit: Payer: PRIVATE HEALTH INSURANCE | Admitting: Internal Medicine

## 2011-11-22 ENCOUNTER — Encounter: Payer: Self-pay | Admitting: Internal Medicine

## 2011-11-22 ENCOUNTER — Ambulatory Visit (INDEPENDENT_AMBULATORY_CARE_PROVIDER_SITE_OTHER): Payer: PRIVATE HEALTH INSURANCE | Admitting: Internal Medicine

## 2011-11-22 VITALS — BP 113/73 | HR 72 | Temp 97.6°F | Ht 62.0 in | Wt 184.3 lb

## 2011-11-22 DIAGNOSIS — R42 Dizziness and giddiness: Secondary | ICD-10-CM

## 2011-11-22 DIAGNOSIS — I951 Orthostatic hypotension: Secondary | ICD-10-CM

## 2011-11-22 NOTE — Patient Instructions (Signed)
Return tomorrow morning with all of your medicines.

## 2011-11-22 NOTE — Progress Notes (Signed)
Subjective:    Patient ID: [name], female    DOB: 11/06/1934, 77 y.o.   MRN: [id number]  CC: dizziness  HPI:  This is a 77 year old woman with a long history of dizziness complaints and a past history of migraine headaches.  It is clear that she has two separate complaints.  A lightheaded dizziness and a vertiginous dizzy, and the two are unrelated.  The lightheadedness occurs a few times a month.  She and her daughter relate this complaint with poor appetite and not eating regular meals.  She states, "I feel light in my head."  The other complaint of vertigo has an onset of 20 to 30 years ago.  In her 75s and 75s, she had very severe migraine headaches.  Vertigo became a problem around age 77 for her.  Once or twice a year to even skipping a year or two, she has an episode that begins with an acute onset, severe, throbbing, bilateral headache.  She then becomes vertiginous and nauseated to the point of vomiting.  She reports photophobia as well with these episodes.  They persist until an abortive therapy is administered, usually in an ED.  With these, she denies syncope, loss of vision, visual disturbances, occular discharge, numbness or tingling in the face or extremities, motor deficits, epileptiform movements, and rhinorrhea.  She does report some tinnitus with these episodes too.  She has been prescribed meclizine, which she takes occasionally for both lightheadedness and vertigo.  She has never been evaluated by physical therapy or by neurology.      Review of Systems  HENT: Positive for tinnitus. Negative for hearing loss and rhinorrhea.   Eyes: Negative.  Negative for discharge and visual disturbance.  Gastrointestinal: Positive for nausea and vomiting.  Neurological: Positive for dizziness, light-headedness and headaches. Negative for seizures, syncope, facial asymmetry, speech difficulty, weakness and numbness.       Objective:   Physical Exam GENERAL: well developed, well  nourished; no acute distress HEAD: atraumatic, normocephalic EYES: pupils equal, round and reactive; sclera anicteric; normal conjunctiva EARS: hearing decreased on the right, Weber lateralizes to right (right-sided conductive hearing loss) NOSE/THROAT: oropharynx clear, moist mucous membranes, pink gums LUNGS: clear to auscultation bilaterally, normal work of breathing HEART: normal rate and regular rhythm; normal S1 and S2 without S3 or S4; no murmurs, rubs, or clicks PULSES: radial 2+ and symmetric MOTOR: 5/5 grip strength bilaterally SENSATION: gross sensation in the hands and forearms bilaterally REFLEXES: 2+ biceps and symmetric CRANIAL NERVES: pupils reactive to light bilaterally; extra occular muscles are intact; facial sensation is intact and equal bilaterally in V1, V2, and V3, and masseter and temporalis function is intact; forehead wrinkles symmetrically, orbicularis oculi strength is normal and equal bilaterally, smile is symmetric, cheeks puff out equally without air excursion, and depressor anguli oris function is intact bilaterally; hearing decreased on the right, Weber test lateralizes to right (right-sided conductive hearing loss), tested with a tuning fork; uvula is midline and palate elevates symmetrically; trapezius and sternocleidomastoid strength is normal and equal bilaterally; tongue protrudes midline SKIN: warm, dry, intact, normal turgor, no rashes PSYCH: patient is alert and oriented, mood and affect are normal and congruent, thought content is normal without delusions, thought process is linear, speech is normal and non-pressured, behavior is normal, judgement and insight are normal, there is some slight cognitive deficits apparent during the interview related to memory of medications and recent medical appointments   Filed Vitals:   11/22/11 1405  BP: 113/73  Pulse: 72  Temp: 97.6 F (36.4 C)    Brain MRI March 12, 2011 IMPRESSION:  1. 17 mm extra-axial mass  lesion along the plane is seen at L8 is  most compatible with a meningioma.  2. No evidence for acute infarct or focal lesion to explain the  patient's symptoms.  3. Mild periventricular subcortical white matter disease.  4. Single focus of susceptibility in the right temporal lobe is  most compatible with a punctate remote hemorrhage. This is  nonspecific.         Assessment & Plan:

## 2011-11-22 NOTE — Assessment & Plan Note (Signed)
Patient may have a component of orthostatic hypotension and dizziness, which is the more common "lightheaded" complaint.  She also meets [facility] diagnostic criteria for vestibular migraine with the less frequent vertigo episodes that occur on a more yearly basis.  I am unable to reconcile her current medicines today, some of which are likely contributing to orthostatic hypotension while others may be used as a abortive regimen for vestibular migraines.  For this reason, I am unable to make any therapeutic changes today.  I have encouraged her to drink plenty of water an to eat three healthy, regular meals every day regardless of appetite.  She has agreed to come back tomorrow morning with her medicines.  At that time, I will reconcile her medicines, educate her on their indication and uses, and I will make any necessary changes to her medicines.

## 2011-11-23 ENCOUNTER — Encounter: Payer: Self-pay | Admitting: Internal Medicine

## 2011-11-23 ENCOUNTER — Ambulatory Visit (INDEPENDENT_AMBULATORY_CARE_PROVIDER_SITE_OTHER): Payer: PRIVATE HEALTH INSURANCE | Admitting: Internal Medicine

## 2011-11-23 VITALS — BP 120/72 | HR 66 | Temp 97.8°F | Ht 62.0 in | Wt 186.7 lb

## 2011-11-23 DIAGNOSIS — R42 Dizziness and giddiness: Secondary | ICD-10-CM

## 2011-11-23 MED ORDER — MECLIZINE HCL 25 MG PO TABS: 25.0000 mg | ORAL_TABLET | Freq: Four times a day (QID) | ORAL | Status: DC | PRN

## 2011-11-23 MED ORDER — PROMETHAZINE HCL 25 MG PO TABS: 25.0000 mg | ORAL_TABLET | ORAL | Status: DC | PRN

## 2011-11-23 NOTE — Progress Notes (Addendum)
  Subjective:    Patient ID: [name], female    DOB: 1934-09-09, 77 y.o.   MRN: [id number]  CC: dizziness  HPI:  This is a 77 year old woman who I saw yesterday for dizziness.  From talking with her yesterday and again today, it is clear that she has two separate complaints.  A lightheaded dizziness and a vertiginous dizziness; the two are unrelated. The lightheadedness occurs a few times a month. She and her daughter relate this complaint with poor appetite and not eating regular meals.  At times she will only eat a few crackers or fruit for her meals.  She describes this dizziness as "I feel light in my head."  It is not associated with positional changes and it eventually passes after several hours to a day.  There are no associated symptoms, specifically no neurological deficits, syncope, or headache.  The other complaint of vertigo had an onset of 20 to 30 years ago. In her 42s and 75s, she had very severe migraine headaches. Vertigo became a problem around age 69 for her.  On average, she has an episode of vertigo less than once a year.  An episode begins with acute onset, severe, throbbing, bilateral headache. She then becomes vertiginous and nauseated to the point of vomiting. She reports photophobia as well with these episodes. They persist until an abortive therapy is administered, usually in an ED. With these, she denies syncope, loss of vision, visual disturbances, occular discharge, numbness or tingling in the face or extremities, motor deficits, epileptiform movements, and rhinorrhea. She does report some tinnitus with these episodes too. She has been prescribed meclizine, which she takes occasionally for both lightheadedness and vertigo. She has never been evaluated by physical therapy or by neurology.  She has had neuroimaging in the past   Review of Systems     Objective:   Physical Exam  Filed Vitals:   11/23/11 1004  BP: 120/72  Pulse: 66    BP Readings from Last 3  Encounters:  11/23/11 120/72  11/22/11 113/73  11/11/11 116/76    Pulse Readings from Last 3 Encounters:  11/23/11 66  11/22/11 72  11/11/11 60    Wt Readings from Last 3 Encounters:  11/23/11 186 lb 11.2 oz (84.687 kg)  11/22/11 184 lb 4.8 oz (83.598 kg)  11/11/11 165 lb (74.844 kg)    Orthostatic vital signs: 11/23/2011    Blood Pressure Heart Rate  Lying 125/74 63  Sitting  115/73 64  Standing  120/72 66       Assessment & Plan:

## 2011-11-23 NOTE — Patient Instructions (Addendum)
For vertigo/migraines, at the first sign of one of these episodes, take the following: 1.   PROMETHAZINE - Take 1 tablet (25mg ).  May repeat every 4 hours as needed. 2.   MECLIZINE - Take 1 tablet (25mg ).  May repeat every 6 hours as needed.  For pain, remember not to take hydrocodone and oxycodone together.  Think of these as the same medicine and leave 4 hours between doses. Also, make sure you are taking no more than 3,000mg  of acetaminophen (Tylenol) in any 24 hour period.  Take your other medicines as directed.

## 2011-11-23 NOTE — Assessment & Plan Note (Addendum)
A: Again, as I stated yesterday, I am convinced she is suffering from occasional (less than once a year) vestibular migraine headaches.  She has a history of migraine headaches and the current vestibular symptoms are always preceded in onset by a severe throbbing headache, photophobia, nausea, and vomiting.  I also believe she is having more frequent lightheadedness perhaps orthostatic in nature (though she was not orthostatic in my office today) or related to poor appetite and nutrition. P: For the vestibular migraines, she now has an abortive therapy to use at home.  She has been instructed to take 25mg  of meclizine and 25mg  of promethazine together at the first sign of symptom onset.  These can be repeated at six and four hour intervals respectively.  If symptoms do not resolve and worsen, she has been instructed to call the clinic or go to the ED.  For the lightheadedness, I will not make changes to her blood pressure regimen today since she is not symptomatic or orthostatic today.  Instead, I have instructed her to eat three healthy meals a day including good sources of protein, healthy fats, and carbohydrates.  I have asked her to measure her blood pressure at home anytime she feels lightheaded and record that for [place] and Dr. Moshe Cipro.  I discontinued the lorazepam prescribed recently by the ED.  I will forward my notes from today and yesterday to her nephrologist, Dr. Moshe Cipro. - meclizine+promethazine for abortive therapy - eat three meals a day - no change to anti-hypertensives today

## 2012-01-02 HISTORY — PX: HALLUX VALGUS CORRECTION: SUR315

## 2012-01-15 NOTE — Addendum Note (Signed)
Addended by: Truddie Crumble on: 01/15/2012 09:25 AM   Modules accepted: Orders

## 2012-01-30 ENCOUNTER — Encounter: Payer: PRIVATE HEALTH INSURANCE | Admitting: Internal Medicine

## 2012-02-13 ENCOUNTER — Encounter: Payer: Self-pay | Admitting: Internal Medicine

## 2012-02-13 ENCOUNTER — Ambulatory Visit (INDEPENDENT_AMBULATORY_CARE_PROVIDER_SITE_OTHER): Payer: PRIVATE HEALTH INSURANCE | Admitting: Internal Medicine

## 2012-02-13 VITALS — BP 129/77 | HR 60 | Temp 98.2°F | Wt 180.6 lb

## 2012-02-13 DIAGNOSIS — N189 Chronic kidney disease, unspecified: Secondary | ICD-10-CM

## 2012-02-13 DIAGNOSIS — I10 Essential (primary) hypertension: Secondary | ICD-10-CM

## 2012-02-13 DIAGNOSIS — I129 Hypertensive chronic kidney disease with stage 1 through stage 4 chronic kidney disease, or unspecified chronic kidney disease: Secondary | ICD-10-CM

## 2012-02-13 DIAGNOSIS — R0600 Dyspnea, unspecified: Secondary | ICD-10-CM

## 2012-02-13 DIAGNOSIS — Z13820 Encounter for screening for osteoporosis: Secondary | ICD-10-CM

## 2012-02-13 DIAGNOSIS — R0989 Other specified symptoms and signs involving the circulatory and respiratory systems: Secondary | ICD-10-CM

## 2012-02-13 DIAGNOSIS — H6120 Impacted cerumen, unspecified ear: Secondary | ICD-10-CM

## 2012-02-13 DIAGNOSIS — R0609 Other forms of dyspnea: Secondary | ICD-10-CM | POA: Insufficient documentation

## 2012-02-13 LAB — TSH: TSH: 4.091 u[IU]/mL (ref 0.350–4.500)

## 2012-02-13 LAB — CBC WITH DIFFERENTIAL/PLATELET
Basophils Absolute: 0 10*3/uL (ref 0.0–0.1)
Basophils Relative: 0 % (ref 0–1)
Eosinophils Absolute: 0.3 10*3/uL (ref 0.0–0.7)
Eosinophils Relative: 5 % (ref 0–5)
HCT: 37.8 % (ref 36.0–46.0)
Hemoglobin: 12.3 g/dL (ref 12.0–15.0)
Lymphocytes Relative: 32 % (ref 12–46)
Lymphs Abs: 1.7 10*3/uL (ref 0.7–4.0)
MCH: 30.4 pg (ref 26.0–34.0)
MCHC: 32.5 g/dL (ref 30.0–36.0)
MCV: 93.3 fL (ref 78.0–100.0)
Monocytes Absolute: 0.4 10*3/uL (ref 0.1–1.0)
Monocytes Relative: 7 % (ref 3–12)
Neutro Abs: 3 10*3/uL (ref 1.7–7.7)
Neutrophils Relative %: 56 % (ref 43–77)
Platelets: 211 10*3/uL (ref 150–400)
RBC: 4.05 MIL/uL (ref 3.87–5.11)
RDW: 14 % (ref 11.5–15.5)
WBC: 5.3 10*3/uL (ref 4.0–10.5)

## 2012-02-13 LAB — COMPLETE METABOLIC PANEL WITH GFR
ALT: 14 U/L (ref 0–35)
AST: 17 U/L (ref 0–37)
Albumin: 4.1 g/dL (ref 3.5–5.2)
Alkaline Phosphatase: 151 U/L — ABNORMAL HIGH (ref 39–117)
BUN: 58 mg/dL — ABNORMAL HIGH (ref 6–23)
CO2: 25 mEq/L (ref 19–32)
Calcium: 9.5 mg/dL (ref 8.4–10.5)
Chloride: 104 mEq/L (ref 96–112)
Creat: 2.63 mg/dL — ABNORMAL HIGH (ref 0.50–1.10)
GFR, Est African American: 20 mL/min — ABNORMAL LOW
GFR, Est Non African American: 17 mL/min — ABNORMAL LOW
Glucose, Bld: 85 mg/dL (ref 70–99)
Potassium: 4.4 mEq/L (ref 3.5–5.3)
Sodium: 142 mEq/L (ref 135–145)
Total Bilirubin: 0.4 mg/dL (ref 0.3–1.2)
Total Protein: 8.1 g/dL (ref 6.0–8.3)

## 2012-02-13 LAB — LIPID PANEL
Cholesterol: 189 mg/dL (ref 0–200)
HDL: 61 mg/dL (ref >39)
LDL Cholesterol: 100 mg/dL — ABNORMAL HIGH (ref 0–99)
Total CHOL/HDL Ratio: 3.1 Ratio
Triglycerides: 139 mg/dL (ref <150)
VLDL: 28 mg/dL (ref 0–40)

## 2012-02-13 MED ORDER — BUPROPION HCL ER (XL) 150 MG PO TB24: 150.0000 mg | ORAL_TABLET | Freq: Every day | ORAL | Status: DC

## 2012-02-13 MED ORDER — CARBAMIDE PEROXIDE 6.5 % OT SOLN: 5.0000 [drp] | Freq: Two times a day (BID) | OTIC | Status: AC

## 2012-02-13 NOTE — Assessment & Plan Note (Addendum)
Assessment: Patient has chronic exertional dyspnea which has been somewhat worse recently; she also reports orthopnea.  She has a history of pericardial effusion requiring drainage in 2006.  Plan: A 2-D echocardiogram, CBC, metabolic panel, and TSH were ordered.

## 2012-02-13 NOTE — Assessment & Plan Note (Addendum)
Lab Results  Component Value Date   CREATININE 2.63* 02/13/2012   CREATININE 2.89* 11/11/2011   CREATININE 2.41* 07/27/2011     Assessment: Creatinine is relatively stable; patient is followed by nephrologist Dr. Moshe Cipro.  Plan: Will forward lab results to Dr. Moshe Cipro.

## 2012-02-13 NOTE — Patient Instructions (Addendum)
General Instructions: Use Debrox otic solution 5 drops in the left ear twice a day for no longer than 4 days. Return in one week for nursing visit for irrigation of left ear. An appointment has been requested for an echocardiogram.   Appointment has been requested for a DEXA bone density scan.   Treatment Goals:  Goals (1 Years of Data) as of 02/13/12         As of Today 11/23/11 11/23/11 11/23/11 11/23/11     Blood Pressure    . Blood Pressure < 130/80  129/77 120/72 115/73 125/74 112/70      Progress Toward Treatment Goals:  Treatment Goal 02/13/2012  Blood pressure at goal    Self Care Goals & Plans:  Self Care Goal 02/13/2012  Manage my medications take my medicines as prescribed; bring my medications to every visit  Eat healthy foods eat foods that are low in salt; eat baked foods instead of fried foods       Care Management & Community Referrals:  Referral 02/13/2012  Referrals made for care management support none needed

## 2012-02-13 NOTE — Progress Notes (Signed)
  Subjective:    Patient ID: [name], female    DOB: November 18, 1934, 78 y.o.   MRN: [id number]  HPI Patient returns for followup of her hypertension, chronic kidney disease, and other medical problems.  She was last seen here in November with vertigo, and reports that she has not had an episode of vertigo since then.  She has had occasional mild dizziness, the last time was a few weeks ago.  Regarding her vertigo, she reports a few episodes over the past 20 years but these are not frequent.  Today she also reports generalized fatigability and exertional dyspnea which seems to be gradually worsening; she also reports sleeping on 3 pillows because of breathing difficulty when she lies flat.  She is concerned that her pericardial effusion, which was surgically drained in 2006 by a subxiphoid pericardial window, may have recurred.  Patient also reports earwax buildup in her left ear, and asked if this could be removed.  She reports that she is compliant with her medications.  Review of Systems  Constitutional: Positive for fatigue. Negative for fever, chills and diaphoresis.  Respiratory: Positive for cough (Occasional) and shortness of breath (Mild exertional dyspnea, chronic.).   Cardiovascular: Negative for chest pain and leg swelling.  Gastrointestinal: Negative for nausea, vomiting, abdominal pain and blood in stool.  Genitourinary: Negative for dysuria and frequency.  Musculoskeletal: Positive for arthralgias (Ocassional right knee pain.). Negative for myalgias.  Neurological: Negative for syncope and numbness.  Psychiatric/Behavioral: Negative for suicidal ideas.       Objective:   Physical Exam  Constitutional: No distress.  HENT:  Right Ear: Tympanic membrane, external ear and ear canal normal. No drainage or tenderness.  Left Ear: External ear and ear canal normal. No drainage or tenderness.  Ears:  Cardiovascular: Normal rate and normal heart sounds.  Exam reveals no gallop and no  friction rub.   No murmur heard. No JVD; no lower extremity edema.  Pulmonary/Chest: Effort normal and breath sounds normal. No respiratory distress. She has no wheezes. She has no rales.  Abdominal: Bowel sounds are normal. She exhibits no distension. There is no tenderness. There is no rebound and no guarding.  Musculoskeletal: She exhibits no edema.       Assessment & Plan:

## 2012-02-13 NOTE — Assessment & Plan Note (Signed)
BP Readings from Last 3 Encounters:  02/13/12 129/77  11/23/11 120/72  11/22/11 113/73    Lab Results  Component Value Date   NA 142 02/13/2012   K 4.4 02/13/2012   CREATININE 2.63* 02/13/2012    Assessment:  Blood pressure control: controlled  Progress toward BP goal:  at goal  Comments: Patient is doing well on amlodipine 10 mg daily, atenolol 25 mg daily, and furosemide 40 mg twice a day.  Plan:  Medications:  continue current medications  Educational resources provided: brochure  Self management tools provided: home blood pressure logbook

## 2012-02-13 NOTE — Assessment & Plan Note (Signed)
Assessment: Patient's left ear canal was gently irrigated by the nurse without success.  Patient reports no pain, discharge, or hearing loss, and no history of perforated tympanic membrane.  Plan: Treat with Debrox otic solution 5 drops twice a day for 4 days; patient will return next week for a nursing visit to try again irrigating the left ear canal.  If that is not successful, the plan is to refer to ENT.

## 2012-02-19 ENCOUNTER — Ambulatory Visit: Payer: PRIVATE HEALTH INSURANCE

## 2012-02-20 ENCOUNTER — Ambulatory Visit (HOSPITAL_COMMUNITY)
Admission: RE | Admit: 2012-02-20 | Discharge: 2012-02-20 | Disposition: A | Discharge status: Home / Self Care | Payer: PRIVATE HEALTH INSURANCE | Source: Ambulatory Visit | Attending: Internal Medicine | Admitting: Internal Medicine

## 2012-02-20 DIAGNOSIS — Z780 Asymptomatic menopausal state: Secondary | ICD-10-CM | POA: Insufficient documentation

## 2012-02-20 DIAGNOSIS — Z13820 Encounter for screening for osteoporosis: Secondary | ICD-10-CM | POA: Insufficient documentation

## 2012-02-21 ENCOUNTER — Ambulatory Visit: Payer: PRIVATE HEALTH INSURANCE

## 2012-03-06 ENCOUNTER — Telehealth: Payer: Self-pay | Admitting: Internal Medicine

## 2012-03-06 ENCOUNTER — Encounter: Payer: Self-pay | Admitting: Internal Medicine

## 2012-03-06 DIAGNOSIS — M810 Age-related osteoporosis without current pathological fracture: Secondary | ICD-10-CM | POA: Insufficient documentation

## 2012-03-06 HISTORY — DX: Age-related osteoporosis without current pathological fracture: M81.0

## 2012-03-06 NOTE — Assessment & Plan Note (Addendum)
Telephone Contact Note  A DXA bone density scan done 02/20/2012 showed osteoporosis with a lumbar spine T-score of -4.4 and a right femur T-score of -2.8.  Patient has chronic kidney disease with estimated GFR less than 30, and it is not clear how much of her osteoporosis may be due to renal osteodystrophy.  I called her today and discussed the finding with her.  She has an appointment with me next week 03/12/2012, and at that time the plan is to draw additional labs including serum calcium, phosphorus, parathyroid hormone, 25-hydroxyvitamin D, and bone specific alkaline phosphatase.   Will also contact patient's nephrologist Dr. Clover Mealy for advice on management.

## 2012-03-06 NOTE — Telephone Encounter (Signed)
A DXA bone density scan done 02/20/2012 showed osteoporosis with a lumbar spine T-score of -4.4 and a right femur T-score of -2.8.  Patient has chronic kidney disease with estimated GFR less than 30, and it is not clear how much of her osteoporosis may be due to renal osteodystrophy.  I called her today and discussed the finding with her.  She has an appointment with me next week 03/12/2012, and at that time the plan is to draw additional labs including serum calcium, phosphorus, parathyroid hormone, 25-hydroxyvitamin D, and bone specific alkaline phosphatase.   Will also contact patient's nephrologist Dr. Clover Mealy for advice on management.

## 2012-03-11 ENCOUNTER — Ambulatory Visit (HOSPITAL_COMMUNITY)
Admission: RE | Admit: 2012-03-11 | Discharge: 2012-03-11 | Disposition: A | Discharge status: Home / Self Care | Payer: PRIVATE HEALTH INSURANCE | Source: Ambulatory Visit | Attending: Internal Medicine | Admitting: Internal Medicine

## 2012-03-11 DIAGNOSIS — R0989 Other specified symptoms and signs involving the circulatory and respiratory systems: Secondary | ICD-10-CM | POA: Insufficient documentation

## 2012-03-11 DIAGNOSIS — R0600 Dyspnea, unspecified: Secondary | ICD-10-CM

## 2012-03-11 DIAGNOSIS — I369 Nonrheumatic tricuspid valve disorder, unspecified: Secondary | ICD-10-CM

## 2012-03-11 DIAGNOSIS — R0609 Other forms of dyspnea: Secondary | ICD-10-CM | POA: Insufficient documentation

## 2012-03-11 NOTE — Progress Notes (Signed)
Echocardiogram 2D Echocardiogram has been performed.  [name] 03/11/2012, 9:14 AM

## 2012-03-12 ENCOUNTER — Ambulatory Visit (INDEPENDENT_AMBULATORY_CARE_PROVIDER_SITE_OTHER): Payer: PRIVATE HEALTH INSURANCE | Admitting: Internal Medicine

## 2012-03-12 ENCOUNTER — Ambulatory Visit (HOSPITAL_COMMUNITY)
Admission: RE | Admit: 2012-03-12 | Discharge: 2012-03-12 | Disposition: A | Discharge status: Home / Self Care | Payer: PRIVATE HEALTH INSURANCE | Source: Ambulatory Visit | Attending: Internal Medicine | Admitting: Internal Medicine

## 2012-03-12 ENCOUNTER — Encounter: Payer: Self-pay | Admitting: Internal Medicine

## 2012-03-12 VITALS — BP 130/80 | HR 70 | Temp 98.0°F | Ht 62.0 in | Wt 184.3 lb

## 2012-03-12 DIAGNOSIS — M25561 Pain in right knee: Secondary | ICD-10-CM | POA: Insufficient documentation

## 2012-03-12 DIAGNOSIS — R0609 Other forms of dyspnea: Secondary | ICD-10-CM | POA: Insufficient documentation

## 2012-03-12 DIAGNOSIS — M1710 Unilateral primary osteoarthritis, unspecified knee: Secondary | ICD-10-CM | POA: Insufficient documentation

## 2012-03-12 DIAGNOSIS — R0989 Other specified symptoms and signs involving the circulatory and respiratory systems: Secondary | ICD-10-CM

## 2012-03-12 DIAGNOSIS — R0600 Dyspnea, unspecified: Secondary | ICD-10-CM

## 2012-03-12 DIAGNOSIS — H6122 Impacted cerumen, left ear: Secondary | ICD-10-CM

## 2012-03-12 DIAGNOSIS — M25569 Pain in unspecified knee: Secondary | ICD-10-CM | POA: Insufficient documentation

## 2012-03-12 DIAGNOSIS — H6120 Impacted cerumen, unspecified ear: Secondary | ICD-10-CM

## 2012-03-12 DIAGNOSIS — I10 Essential (primary) hypertension: Secondary | ICD-10-CM

## 2012-03-12 DIAGNOSIS — I129 Hypertensive chronic kidney disease with stage 1 through stage 4 chronic kidney disease, or unspecified chronic kidney disease: Secondary | ICD-10-CM

## 2012-03-12 DIAGNOSIS — M810 Age-related osteoporosis without current pathological fracture: Secondary | ICD-10-CM

## 2012-03-12 DIAGNOSIS — IMO0002 Reserved for concepts with insufficient information to code with codable children: Secondary | ICD-10-CM | POA: Insufficient documentation

## 2012-03-12 DIAGNOSIS — R0602 Shortness of breath: Secondary | ICD-10-CM | POA: Insufficient documentation

## 2012-03-12 DIAGNOSIS — N189 Chronic kidney disease, unspecified: Secondary | ICD-10-CM

## 2012-03-12 NOTE — Assessment & Plan Note (Signed)
Assessment A DXA bone density scan done 02/20/2012 showed osteoporosis with a lumbar spine T-score of -4.4 and a right femur T-score of -2.8.  Patient has chronic kidney disease with estimated GFR less than 30, and it is not clear how much of her osteoporosis may be due to renal osteodystrophy.   Plan Draw additional labs including serum calcium, phosphorus, parathyroid hormone, 25-hydroxyvitamin D, and bone specific alkaline phosphatase; contact patient's nephrologist Dr. Clover Mealy for advice on management.  I discussed the diagnosis and plan with patient.

## 2012-03-12 NOTE — Assessment & Plan Note (Signed)
Assessment: Patient has mild chronic exertional dyspnea which has been stable since her last clinic visit.  She has a history of pericardial effusion requiring drainage in 2006, but recent 2-D echocardiogram done 03/11/2012 showed no pericardial effusion and normal left ventricular function.  She has no signs of heart failure or obstructive lung disease on exam. Her O2 saturation is 97-99% on room air.  Plan: CXR today.

## 2012-03-12 NOTE — Assessment & Plan Note (Signed)
Assessment: Patient reports chronic right knee pain; she denies any injury.  Exam shows no signs of inflammation.  Her pain is likely due to to DJD.  Plan:  Right knee X-rays; consider referral to Sports Medicine or orthopedics depending upon the result.

## 2012-03-12 NOTE — Assessment & Plan Note (Signed)
BP Readings from Last 3 Encounters:  03/12/12 130/80  02/13/12 129/77  11/23/11 120/72    Lab Results  Component Value Date   NA 142 02/13/2012   K 4.4 02/13/2012   CREATININE 2.63* 02/13/2012    Assessment:  Blood pressure control: controlled  Progress toward BP goal:  at goal  Comments: Patient is doing well on amlodipine 10 mg daily, atenolol 25 mg daily, and furosemide 40 mg twice a day.  Plan:  Medications:  continue current medications

## 2012-03-12 NOTE — Patient Instructions (Signed)
General Instructions:    Treatment Goals:  Goals (1 Years of Data) as of 03/12/12         As of Today As of Today As of Today As of Today 02/13/12     Blood Pressure    . Blood Pressure < 130/80  130/80 120/80 120/70 143/82 129/77      Progress Toward Treatment Goals:  Treatment Goal 03/12/2012  Blood pressure at goal    Self Care Goals & Plans:  Self Care Goal 03/12/2012  Manage my medications take my medicines as prescribed; bring my medications to every visit  Eat healthy foods eat foods that are low in salt; eat baked foods instead of fried foods       Care Management & Community Referrals:  Referral 03/12/2012  Referrals made for care management support none needed

## 2012-03-12 NOTE — Assessment & Plan Note (Signed)
Lab Results  Component Value Date   CREATININE 2.63* 02/13/2012   CREATININE 2.89* 11/11/2011   CREATININE 2.41* 07/27/2011     Assessment: Creatinine is stable; patient is followed by nephrologist Dr. Moshe Cipro.  Plan: Patient will follow up with Dr. Moshe Cipro.

## 2012-03-12 NOTE — Assessment & Plan Note (Signed)
Assessment: Patient's left ear canal was irrigated by the nurse with good result; after irrigation, exam showed no residual cerumen and TM had normal landmarks.  Patient reported that her hearing was normal following cerumen removal.  Plan: Patient was advised to call if she any further problems.

## 2012-03-12 NOTE — Progress Notes (Signed)
  Subjective:    Patient ID: [name], female    DOB: 1934/04/05, 78 y.o.   MRN: [id number]  HPI Patient returns for follow-up of cerumen impaction of the left ear, fatigue, chronic mild exertional dyspnea, and other medical problems.  Today she reports no improvement in her decreased hearing in the left ear; she has used the ear drops as advised.  She reports stable chronic exertional dyspnea.  She also reports right knee pain for several months for which she occasionally take acetaminophen.     Review of Systems  Constitutional: Negative for fever, chills and diaphoresis.  Respiratory: Negative for shortness of breath (Mild stable exertional dyspnea).   Cardiovascular: Negative for chest pain and leg swelling.  Gastrointestinal: Negative for nausea, vomiting and abdominal pain.  Musculoskeletal: Positive for arthralgias (Right knee pain, chronic).       Objective:   Physical Exam  Constitutional: No distress.  HENT:  Ears:  Cardiovascular: Normal rate, regular rhythm and normal heart sounds.  Exam reveals no gallop and no friction rub.   No murmur heard. No lower extremity edema.  Pulmonary/Chest: Effort normal and breath sounds normal. No respiratory distress. She has no wheezes. She has no rales.  Abdominal: Soft. Bowel sounds are normal. She exhibits no distension. There is no hepatosplenomegaly. There is no tenderness. There is no rebound and no guarding.  Musculoskeletal: She exhibits no edema.       Right knee: She exhibits no swelling, no effusion and no erythema.       Assessment & Plan:

## 2012-03-13 LAB — PTH, INTACT AND CALCIUM
Calcium, Total (PTH): 9.1 mg/dL (ref 8.4–10.5)
PTH: 247.4 pg/mL — ABNORMAL HIGH (ref 14.0–72.0)

## 2012-03-13 LAB — VITAMIN D 25 HYDROXY (VIT D DEFICIENCY, FRACTURES): Vit D, 25-Hydroxy: 31 ng/mL (ref 30–89)

## 2012-03-13 LAB — PHOSPHORUS: Phosphorus: 4.5 mg/dL (ref 2.3–4.6)

## 2012-03-18 LAB — ALKALINE PHOSPHATASE ISOENZYMES
Alk Phos Bone Calc: 55 U/L (ref 5–58)
Alk Phos Intestinal Calc: 51 U/L — ABNORMAL HIGH (ref <15)
Alk Phos Liver Calc: 32 U/L (ref 5–93)
Alkaline phosphatase (APISO): 138 U/L — ABNORMAL HIGH (ref 33–130)
Bone %: 40 % (ref 16–56)
Intestinal %: 37 % — ABNORMAL HIGH (ref <14)
Liver %: 23 % — ABNORMAL LOW (ref 44–84)

## 2012-04-25 ENCOUNTER — Encounter: Payer: Self-pay | Admitting: Internal Medicine

## 2012-05-14 ENCOUNTER — Ambulatory Visit (INDEPENDENT_AMBULATORY_CARE_PROVIDER_SITE_OTHER): Payer: PRIVATE HEALTH INSURANCE | Admitting: Internal Medicine

## 2012-05-14 ENCOUNTER — Encounter: Payer: Self-pay | Admitting: Internal Medicine

## 2012-05-14 VITALS — BP 105/68 | HR 92 | Temp 98.0°F | Ht 62.0 in | Wt 177.7 lb

## 2012-05-14 DIAGNOSIS — I10 Essential (primary) hypertension: Secondary | ICD-10-CM

## 2012-05-14 DIAGNOSIS — M25561 Pain in right knee: Secondary | ICD-10-CM

## 2012-05-14 DIAGNOSIS — N189 Chronic kidney disease, unspecified: Secondary | ICD-10-CM

## 2012-05-14 DIAGNOSIS — M25569 Pain in unspecified knee: Secondary | ICD-10-CM

## 2012-05-14 DIAGNOSIS — I129 Hypertensive chronic kidney disease with stage 1 through stage 4 chronic kidney disease, or unspecified chronic kidney disease: Secondary | ICD-10-CM

## 2012-05-14 DIAGNOSIS — M810 Age-related osteoporosis without current pathological fracture: Secondary | ICD-10-CM

## 2012-05-14 DIAGNOSIS — R0600 Dyspnea, unspecified: Secondary | ICD-10-CM

## 2012-05-14 DIAGNOSIS — R0989 Other specified symptoms and signs involving the circulatory and respiratory systems: Secondary | ICD-10-CM

## 2012-05-14 DIAGNOSIS — R0609 Other forms of dyspnea: Secondary | ICD-10-CM

## 2012-05-14 LAB — CBC WITH DIFFERENTIAL/PLATELET
Basophils Absolute: 0 10*3/uL (ref 0.0–0.1)
Basophils Relative: 0 % (ref 0–1)
Eosinophils Absolute: 0.3 10*3/uL (ref 0.0–0.7)
Eosinophils Relative: 6 % — ABNORMAL HIGH (ref 0–5)
HCT: 36.7 % (ref 36.0–46.0)
Hemoglobin: 11.9 g/dL — ABNORMAL LOW (ref 12.0–15.0)
Lymphocytes Relative: 28 % (ref 12–46)
Lymphs Abs: 1.4 10*3/uL (ref 0.7–4.0)
MCH: 29.7 pg (ref 26.0–34.0)
MCHC: 32.4 g/dL (ref 30.0–36.0)
MCV: 91.5 fL (ref 78.0–100.0)
Monocytes Absolute: 0.4 10*3/uL (ref 0.1–1.0)
Monocytes Relative: 9 % (ref 3–12)
Neutro Abs: 2.9 10*3/uL (ref 1.7–7.7)
Neutrophils Relative %: 57 % (ref 43–77)
Platelets: 234 10*3/uL (ref 150–400)
RBC: 4.01 MIL/uL (ref 3.87–5.11)
RDW: 14.9 % (ref 11.5–15.5)
WBC: 5.1 10*3/uL (ref 4.0–10.5)

## 2012-05-14 LAB — BASIC METABOLIC PANEL WITH GFR
BUN: 49 mg/dL — ABNORMAL HIGH (ref 6–23)
CO2: 26 mEq/L (ref 19–32)
Calcium: 8.5 mg/dL (ref 8.4–10.5)
Chloride: 105 mEq/L (ref 96–112)
Creat: 3.04 mg/dL — ABNORMAL HIGH (ref 0.50–1.10)
GFR, Est African American: 16 mL/min — ABNORMAL LOW
GFR, Est Non African American: 14 mL/min — ABNORMAL LOW
Glucose, Bld: 74 mg/dL (ref 70–99)
Potassium: 4.4 mEq/L (ref 3.5–5.3)
Sodium: 142 mEq/L (ref 135–145)

## 2012-05-14 NOTE — Assessment & Plan Note (Signed)
BP Readings from Last 3 Encounters:  05/14/12 105/68  03/12/12 130/80  02/13/12 129/77    Lab Results  Component Value Date   NA 142 02/13/2012   K 4.4 02/13/2012   CREATININE 2.63* 02/13/2012    Assessment: Blood pressure control: controlled Progress toward BP goal:  at goal Comments: Blood pressure is well-controlled on amlodipine 10 mg daily, atenolol 25 mg daily, and furosemide 40 mg twice a day.  Plan: Medications:  continue current medications Educational resources provided: brochure  Self management tools provided: home blood pressure logbook

## 2012-05-14 NOTE — Progress Notes (Signed)
  Subjective:    Patient ID: [name], female    DOB: March 05, 1934, 78 y.o.   MRN: [id number]  HPI Patient returns for followup of her hypertension, chronic kidney disease, right knee pain, and other medical problems.  Overall she has been doing well, and she has no acute complaints.  Her right knee pain is well controlled with occasional over-the-counter Tylenol.  She has stable mild exertional dyspnea which has improved since her last visit.  She saw Dr. Clover Mealy following her last clinic visit here, who recommended that she take Tylenol PM for occasional insomnia and the patient reports good results with that medication.  She also had foot surgery by her podiatrist in April, and reports that this healed well.  She occasionally takes meclizine and/or Phenergan for brief episodes of dizziness associated with nausea, but these are very infrequent.   Review of Systems  Constitutional: Negative for fever, chills and activity change.  Respiratory: Negative for cough and shortness of breath.   Cardiovascular: Negative for chest pain and leg swelling.  Gastrointestinal: Negative for nausea, vomiting, abdominal pain and blood in stool.  Genitourinary: Negative for dysuria and frequency.       Objective:   Physical Exam  Constitutional: No distress.  Cardiovascular: Normal rate and normal heart sounds.  Exam reveals no gallop and no friction rub.   No murmur heard. Pulmonary/Chest: Effort normal and breath sounds normal. No respiratory distress. She has no wheezes. She has no rales.  Abdominal: Soft. Bowel sounds are normal. She exhibits no distension. There is no hepatosplenomegaly. There is no tenderness. There is no guarding.  Musculoskeletal: She exhibits no edema.       Assessment & Plan:

## 2012-05-14 NOTE — Assessment & Plan Note (Signed)
Assessment: Patient's exertional dyspnea has improved since her last clinic visit.  A chest x-ray on 03/28/2012 showed chronic interstitial coarsening but no acute cardiopulmonary abnormalities.  A recent 2-D echocardiogram done 03/11/2012 showed no pericardial effusion and normal left ventricular function.  She has no signs of heart failure or obstructive lung disease on exam.   Plan: I advised patient to let me know she has any worsening of her symptoms; given her improvement and the above findings will not work up further unless symptomatically indicated.

## 2012-05-14 NOTE — Assessment & Plan Note (Signed)
Assessment A DXA bone density scan done 02/20/2012 showed osteoporosis with a lumbar spine T-score of -4.4 and a right femur T-score of -2.8.  Patient has chronic kidney disease with estimated GFR less than 30, and her nephrologist Dr. Moshe Cipro advises treating her renal osteodystrophy but does not recommend pharmacologic therapy for the osteoporosis (see her office note dated 04/09/2012).  Plan Continue treatment of her renal osteodystrophy by nephrologist Dr. Moshe Cipro; follow Dr. Shelva Majestic recommendation for no pharmacological therapy for the osteoporosis.

## 2012-05-14 NOTE — Assessment & Plan Note (Signed)
Lab Results  Component Value Date   CREATININE 2.63* 02/13/2012   CREATININE 2.89* 11/11/2011   CREATININE 2.41* 07/27/2011     Assessment: Creatinine is stable; patient is followed by nephrologist Dr. Moshe Cipro.  Plan: Will check basic metabolic panel and CBC with differential today; patient will follow up with Dr. Moshe Cipro as scheduled.

## 2012-05-14 NOTE — Patient Instructions (Signed)
General Instructions: Continue current medications.   Treatment Goals:  Goals (1 Years of Data) as of 05/14/12         As of Today 03/12/12 03/12/12 03/12/12 03/12/12     Blood Pressure    . Blood Pressure < 130/80  105/68 130/80 120/80 120/70 143/82      Progress Toward Treatment Goals:  Treatment Goal 05/14/2012  Blood pressure at goal    Self Care Goals & Plans:  Self Care Goal 05/14/2012  Manage my medications take my medicines as prescribed; bring my medications to every visit  Eat healthy foods eat more vegetables; eat fruit for snacks and desserts  Be physically active find a convenient safe place to exercise       Care Management & Community Referrals:  Referral 05/14/2012  Referrals made for care management support none needed  Referrals made to community resources none

## 2012-05-14 NOTE — Assessment & Plan Note (Addendum)
Assessment: Patient's right knee pain has improved and is well controlled with occasional use of over-the-counter acetaminophen.  Right knee x-ray on 03/28/2012 showed tricompartmental slight osteoarthritis, a small joint effusion, and no acute abnormality.   Plan:  Continue conservative symptomatic management with occasional over-the-counter acetaminophen as needed.

## 2012-05-15 ENCOUNTER — Telehealth: Payer: Self-pay | Admitting: Internal Medicine

## 2012-05-15 DIAGNOSIS — N189 Chronic kidney disease, unspecified: Secondary | ICD-10-CM

## 2012-05-15 NOTE — Assessment & Plan Note (Signed)
Lab Results  Component Value Date   CREATININE 3.04* 05/14/2012   CREATININE 2.63* 02/13/2012   CREATININE 2.89* 11/11/2011    BP Readings from Last 3 Encounters:  05/14/12 105/68  03/12/12 130/80  02/13/12 129/77    Wt Readings from Last 3 Encounters:  05/14/12 177 lb 11.2 oz (80.604 kg)  03/12/12 184 lb 4.8 oz (83.598 kg)  02/13/12 180 lb 9.6 oz (81.92 kg)    Telephone Contact: Patient's labs done 05/14/2012 showed a creatinine of 3.04, a little higher than patients baseline; her blood pressure was in the low normal range, and her weight was down about 7 pounds since March.  I spoke with her nephrologist Dr. Moshe Cipro by phone, who felt that she may be volume depleted and advised that we have her hold her furosemide and drink plenty of fluids and then recheck her creatinine in 2 weeks.  I called patient today and gave her those instructions, and I advised that if she develops edema or other signs of volume overload then she should resume furosemide at 40 mg daily.  I also advised her to call right away if she develops any concerning symptoms.

## 2012-05-15 NOTE — Telephone Encounter (Signed)
Lab Results  Component Value Date   CREATININE 3.04* 05/14/2012   CREATININE 2.63* 02/13/2012   CREATININE 2.89* 11/11/2011    BP Readings from Last 3 Encounters:  05/14/12 105/68  03/12/12 130/80  02/13/12 129/77    Wt Readings from Last 3 Encounters:  05/14/12 177 lb 11.2 oz (80.604 kg)  03/12/12 184 lb 4.8 oz (83.598 kg)  02/13/12 180 lb 9.6 oz (81.92 kg)    Telephone Contact: Patient's labs done 05/14/2012 showed a creatinine of 3.04, a little higher than patients baseline; her blood pressure was in the low normal range, and her weight was down about 7 pounds since March.  I spoke with her nephrologist Dr. Moshe Cipro by phone, who felt that she may be volume depleted and advised that we have her hold her furosemide and drink plenty of fluids and then recheck her creatinine in 2 weeks.  I called patient today and gave her those instructions, and I advised that if she develops edema or other signs of volume overload then she should resume furosemide at 40 mg daily.  I also advised her to call right away if she develops any concerning symptoms.

## 2012-05-28 ENCOUNTER — Encounter: Payer: Self-pay | Admitting: Internal Medicine

## 2012-05-28 ENCOUNTER — Ambulatory Visit (INDEPENDENT_AMBULATORY_CARE_PROVIDER_SITE_OTHER): Payer: PRIVATE HEALTH INSURANCE | Admitting: Internal Medicine

## 2012-05-28 VITALS — BP 182/97 | HR 65 | Temp 98.0°F | Ht 62.0 in | Wt 182.8 lb

## 2012-05-28 DIAGNOSIS — N189 Chronic kidney disease, unspecified: Secondary | ICD-10-CM

## 2012-05-28 DIAGNOSIS — I10 Essential (primary) hypertension: Secondary | ICD-10-CM

## 2012-05-28 DIAGNOSIS — I129 Hypertensive chronic kidney disease with stage 1 through stage 4 chronic kidney disease, or unspecified chronic kidney disease: Secondary | ICD-10-CM

## 2012-05-28 LAB — BASIC METABOLIC PANEL WITH GFR
BUN: 25 mg/dL — ABNORMAL HIGH (ref 6–23)
CO2: 21 mEq/L (ref 19–32)
Calcium: 8.4 mg/dL (ref 8.4–10.5)
Chloride: 111 mEq/L (ref 96–112)
Creat: 1.8 mg/dL — ABNORMAL HIGH (ref 0.50–1.10)
GFR, Est African American: 31 mL/min — ABNORMAL LOW
GFR, Est Non African American: 27 mL/min — ABNORMAL LOW
Glucose, Bld: 93 mg/dL (ref 70–99)
Potassium: 4.7 mEq/L (ref 3.5–5.3)
Sodium: 142 mEq/L (ref 135–145)

## 2012-05-28 NOTE — Progress Notes (Signed)
  Subjective:    Patient ID: [name], female    DOB: 05-01-1934, 78 y.o.   MRN: [id number]  HPI Patient returns for follow-up of her hypertension and chronic kidney disease.  At the time of her last visit on 05/14/2012, her creatinine was above baseline and at that time, following discussion with her nephrologist Dr. Moshe Cipro, I stopped her furosemide.  Patient reports no increased shortness of breath or leg edema since she stopped the furosemide.  She saw her nephrologist Dr. Moshe Cipro last week, and she reports that she was found to be hyperkalemic and was treated with Kayexalate prescribed by Dr. Clover Mealy.  She says that she had her potassium rechecked on 05/22/2012 and that it was normal.  She does not yet have a follow-up appointment scheduled with Dr. Moshe Cipro.  Today she reports feeling generally weak, but without focal complaints.   Review of Systems See HPI.    Objective:   Physical Exam  Constitutional: No distress.  Cardiovascular: Normal rate, regular rhythm and normal heart sounds.  Exam reveals no gallop and no friction rub.   No murmur heard. No leg edema  Pulmonary/Chest: Effort normal and breath sounds normal. No respiratory distress. She has no wheezes. She has no rales.  Abdominal: Soft. Bowel sounds are normal. She exhibits no distension. There is no tenderness. There is no rebound and no guarding.        Assessment & Plan:

## 2012-05-28 NOTE — Patient Instructions (Addendum)
Please contact Dr. Shelva Majestic office for follow-up plans. Continue to hold furosemide until further instructed.

## 2012-05-28 NOTE — Assessment & Plan Note (Signed)
Lab Results  Component Value Date   CREATININE 3.04* 05/14/2012   CREATININE 2.63* 02/13/2012   CREATININE 2.89* 11/11/2011    Wt Readings from Last 3 Encounters:  05/28/12 182 lb 12.8 oz (82.918 kg)  05/14/12 177 lb 11.2 oz (80.604 kg)  03/12/12 184 lb 4.8 oz (83.598 kg)    Assessment: Patient's creatinine on 05/14/2012 was above baseline, and following a discussion with her nephrologist Dr. Moshe Cipro I stopped her furosemide.  She has been off of furosemide since then.  She reports no increased shortness of breath or leg edema; her weight has increased about 5 pounds over the past 2 weeks off of furosemide, and is near her prior weight in March of this year.  Patient was apparently hyperkalemic when she saw Dr. Moshe Cipro, and she reports that this corrected after she was treated with Kayexalate.  Plan: Will check a basic metabolic panel today, and will request copies of office records from Dr. Moshe Cipro.

## 2012-05-28 NOTE — Assessment & Plan Note (Signed)
BP Readings from Last 3 Encounters:  05/28/12 182/97  05/14/12 105/68  03/12/12 130/80    Lab Results  Component Value Date   NA 142 05/14/2012   K 4.4 05/14/2012   CREATININE 3.04* 05/14/2012    Assessment: Patient's blood pressure was moderately elevated today; she reports that she did not take her medications yesterday and usually takes them in the evening.  Plan: I advised patient to take her medications as scheduled.  She will need a repeat blood pressure check on her medications.

## 2012-05-29 NOTE — Progress Notes (Signed)
Quick Note:  Patient's creatinine has improved from 3.04 on 05/14/12 to a value 1.80 yesterday since furosemide was discontinued. I called patient today and informed her of the results. I also advised her to come in for a recheck of her blood pressure, which was somewhat elevated yesterday; as noted, she did not take her antihypertensive medications on the evening prior to yesterday's clinic visit. She is unable to come in tomorrow, but will come in Monday afternoon for a recheck of her blood pressure; I advised her to take her medications regularly as prescribed prior to that visit. ______

## 2012-05-30 ENCOUNTER — Telehealth: Payer: Self-pay | Admitting: *Deleted

## 2012-05-30 NOTE — Telephone Encounter (Signed)
Pt called into clinic stating she was seen by Dr Moshe Cipro today and she is feeling "great".  She reports her BP if 128/70. She would like to cancel her appointment with Dr Marinda Elk on Monday.  Dr Marinda Elk informed and is asking for a copy of today's visit. Mamie will call for records, it may be Monday before they are faxed.  Will cancel appointment.

## 2012-06-02 ENCOUNTER — Other Ambulatory Visit: Payer: Self-pay | Admitting: Internal Medicine

## 2012-06-02 ENCOUNTER — Encounter: Payer: PRIVATE HEALTH INSURANCE | Admitting: Internal Medicine

## 2012-06-26 LAB — HEMOCCULT SLIDES (X 3 CARDS)

## 2012-07-02 ENCOUNTER — Ambulatory Visit (INDEPENDENT_AMBULATORY_CARE_PROVIDER_SITE_OTHER): Payer: PRIVATE HEALTH INSURANCE | Admitting: Internal Medicine

## 2012-07-02 ENCOUNTER — Encounter: Payer: Self-pay | Admitting: Internal Medicine

## 2012-07-02 VITALS — BP 133/77 | HR 65 | Temp 97.8°F | Ht 62.0 in | Wt 178.2 lb

## 2012-07-02 DIAGNOSIS — I129 Hypertensive chronic kidney disease with stage 1 through stage 4 chronic kidney disease, or unspecified chronic kidney disease: Secondary | ICD-10-CM

## 2012-07-02 DIAGNOSIS — N189 Chronic kidney disease, unspecified: Secondary | ICD-10-CM

## 2012-07-02 DIAGNOSIS — I10 Essential (primary) hypertension: Secondary | ICD-10-CM

## 2012-07-02 NOTE — Progress Notes (Signed)
  Subjective:    Patient ID: [name], female    DOB: 25-Feb-1934, 78 y.o.   MRN: [id number]  HPI Patient returns for followup of her chronic renal insufficiency, hypertension, and hyperkalemia.  She is doing well, with no complaints.  She saw her nephrologist Dr. Clover Mealy shortly after her last visit here and restarted Lasix at a dose of 40 mg daily.  She reports that she has been doing well on that dose of Lasix, with no lower extremity edema or shortness of breath.  She reports that her insomnia has resolved.  She also reports that she has had no recent episodes of vertigo or dizziness.   Review of Systems  Constitutional: Negative for fever, chills and diaphoresis.  Respiratory: Negative for shortness of breath.   Cardiovascular: Negative for chest pain and leg swelling.  Gastrointestinal: Negative for nausea, vomiting, abdominal pain, blood in stool and anal bleeding.  Genitourinary: Negative for dysuria and frequency.  Musculoskeletal: Negative for myalgias and arthralgias.       Objective:   Physical Exam  Constitutional: No distress.  Cardiovascular: Normal rate, regular rhythm and normal heart sounds.  Exam reveals no gallop and no friction rub.   No murmur heard. No lower extremity edema  Pulmonary/Chest: Effort normal and breath sounds normal. No respiratory distress. She has no wheezes. She has no rales.  Abdominal: Soft. Bowel sounds are normal. She exhibits no distension. There is no tenderness. There is no rebound and no guarding.       Assessment & Plan:

## 2012-07-02 NOTE — Assessment & Plan Note (Signed)
Lab Results  Component Value Date   CREATININE 1.80* 05/28/2012   CREATININE 3.04* 05/14/2012   CREATININE 2.63* 02/13/2012    Lab Results  Component Value Date   K 4.7 05/28/2012   K 4.4 05/14/2012   K 4.4 02/13/2012     Wt Readings from Last 3 Encounters:  07/02/12 178 lb 3.2 oz (80.831 kg)  05/28/12 182 lb 12.8 oz (82.918 kg)  05/14/12 177 lb 11.2 oz (80.604 kg)    Assessment: Patient is doing well on furosemide 40 mg daily, with no lower extremity edema.  Her renal function had improved and her hyperkalemia had corrected when last checked here.  Plan: Check a basic metabolic panel; patient will followup with Dr. Moshe Cipro as planned.

## 2012-07-02 NOTE — Assessment & Plan Note (Signed)
BP Readings from Last 3 Encounters:  07/02/12 133/77  05/28/12 182/97  05/14/12 105/68    Lab Results  Component Value Date   NA 142 05/28/2012   K 4.7 05/28/2012   CREATININE 1.80* 05/28/2012    Assessment: Blood pressure control: controlled Progress toward BP goal:  at goal Comments: Patient is doing well on amlodipine 10 mg daily, atenolol 25 mg daily, and furosemide 40 mg daily.  Plan: Medications:  continue current medications Educational resources provided: brochure Self management tools provided: home blood pressure logbook Other plans: Check a basic metabolic panel today.

## 2012-07-02 NOTE — Patient Instructions (Addendum)
General Instructions: Please schedule a screening mammogram. Please contact your eye doctor regarding refills for your eye drops.   Progress Toward Treatment Goals:  Treatment Goal 07/02/2012  Blood pressure at goal    Self Care Goals & Plans:  Self Care Goal 07/02/2012  Manage my medications take my medicines as prescribed; bring my medications to every visit  Monitor my health keep track of my blood pressure  Eat healthy foods eat fruit for snacks and desserts; eat foods that are low in salt; eat more vegetables  Be physically active take a walk every day       Care Management & Community Referrals:  Referral 07/02/2012  Referrals made for care management support none needed  Referrals made to community resources none

## 2012-07-03 LAB — BASIC METABOLIC PANEL WITH GFR
BUN: 29 mg/dL — ABNORMAL HIGH (ref 6–23)
CO2: 26 mEq/L (ref 19–32)
Calcium: 8.3 mg/dL — ABNORMAL LOW (ref 8.4–10.5)
Chloride: 109 mEq/L (ref 96–112)
Creat: 2.38 mg/dL — ABNORMAL HIGH (ref 0.50–1.10)
GFR, Est African American: 22 mL/min — ABNORMAL LOW
GFR, Est Non African American: 19 mL/min — ABNORMAL LOW
Glucose, Bld: 78 mg/dL (ref 70–99)
Potassium: 4.6 mEq/L (ref 3.5–5.3)
Sodium: 143 mEq/L (ref 135–145)

## 2012-07-06 ENCOUNTER — Other Ambulatory Visit: Payer: Self-pay | Admitting: Internal Medicine

## 2012-07-16 ENCOUNTER — Telehealth: Payer: Self-pay

## 2012-07-17 ENCOUNTER — Ambulatory Visit: Payer: PRIVATE HEALTH INSURANCE | Admitting: Internal Medicine

## 2012-07-17 NOTE — Telephone Encounter (Signed)
I returned call, but got no answer.  It would be best to simply have her come in to the clinic and have this evaluated.

## 2012-07-17 NOTE — Telephone Encounter (Signed)
Pt will be seen in clinic at 3:30 today for evaluation

## 2012-07-18 ENCOUNTER — Encounter: Payer: Self-pay | Admitting: Internal Medicine

## 2012-07-18 ENCOUNTER — Ambulatory Visit: Payer: PRIVATE HEALTH INSURANCE | Admitting: Internal Medicine

## 2012-07-24 ENCOUNTER — Encounter: Payer: Self-pay | Admitting: Internal Medicine

## 2012-07-24 ENCOUNTER — Ambulatory Visit (INDEPENDENT_AMBULATORY_CARE_PROVIDER_SITE_OTHER): Payer: PRIVATE HEALTH INSURANCE | Admitting: Internal Medicine

## 2012-07-24 VITALS — BP 105/68 | HR 62 | Temp 97.0°F | Ht 62.0 in | Wt 178.3 lb

## 2012-07-24 DIAGNOSIS — I10 Essential (primary) hypertension: Secondary | ICD-10-CM

## 2012-07-24 DIAGNOSIS — M79609 Pain in unspecified limb: Secondary | ICD-10-CM

## 2012-07-24 DIAGNOSIS — M79644 Pain in right finger(s): Secondary | ICD-10-CM

## 2012-07-24 MED ORDER — CINACALCET HCL 30 MG PO TABS: 30.0000 mg | ORAL_TABLET | Freq: Every day | ORAL | Status: DC

## 2012-07-24 MED ORDER — AMLODIPINE BESYLATE 10 MG PO TABS: 10.0000 mg | ORAL_TABLET | Freq: Every day | ORAL | Status: DC

## 2012-07-24 NOTE — Progress Notes (Signed)
  Subjective:    Patient ID: [name], female    DOB: Dec 23, 1934, 78 y.o.   MRN: [id number]  HPI [name] is 78 year old female with chronic renal insufficiency, hypertension, osteoarthritis, and hyperkalemia presenting for an acute complaint of a right thumb pain.  For the past 3 months, the patient has noted pain at the base of her right thumb. Aggravating factors include overuse of the hand. She especially notes the pain when she is working in the garden pulling grass and shovelling with a hand troll, as well as cooking with her heavy cast iron pots. During these activities, she rates the pain 10 out of 10, though she qualifies this by saying it is not the worst pain she has ever felt. Of note the patient has chronic left-sided weakness secondary to stroke in 2007, and she is right-handed, so she uses her right hand much more than her left. She denies any falls or injury to the right hand.  She's noted some mild swelling at the base of the thumb. Denies a catching sensation when she extends her thumb. Denies tingling or numbness in the hand. Denies erythema. She is in managing her pain with Tylenol PM 500 mg at night. On one occasion she took ibuprofen because the pain was so bad, though she knows she should not be taking NSAIDs given her renal insufficiency. She also owns a hard wrist and thumb brace that immobilizes her thumb. This helps with the pain when she wears it, to the point where she feels so much better that she stops using it. She has not been wearing it recently, and the pain has returned.    Review of Systems  Constitutional: Negative for fever and chills.  Cardiovascular: Negative for chest pain.  Musculoskeletal: Positive for joint swelling. Negative for myalgias.  Skin: Negative for rash and wound.       Objective:   Physical Exam  Constitutional: She is oriented to person, place, and time.  Musculoskeletal:       Right wrist: She exhibits tenderness (First  carpal-metacarpal joint). She exhibits normal range of motion, no swelling, no effusion, no crepitus and no deformity.  5/5 pincer grasp strength. No palpable nodule along tendon sheath. No warmth. Finkelstein test (ulnar deviation of the wrist with the thumb grasped in the palm by the fingers) does not cause reproduction of acute pain. Applying an axial load on the metacarpal with slight rotation (the "grind test") negative for reproduction of acute pain. Tinnel and Phalen signs negative.    Neurological: She is alert and oriented to person, place, and time. No sensory deficit.          Assessment & Plan:

## 2012-07-24 NOTE — Assessment & Plan Note (Signed)
The location of the pain at her right carpal-metacarpal joint, history of arthritis in her knees, history of overuse of the right hand, and description of the pain as occuring over several months and aggravated by sustained grasping or pinching makes the likely diagnosis osteoarthritis. Another possibility is de Quervain's tenosynovitis (inflammation of the abductor pollicis longus and extensor pollicis brevis tendons) since the pain is aggravated by activities requiring repetitive grasping with the hand such as pulling grass, though Finkelstein test was negative. - X-rays of the right hand ordered to rule out fracture or dislocation - Patient was encouraged to wear her thumb/wrist brace every day and continue Tylenol for pain control - She will call our office if the pain worsens, or if she would like a sports medicine referral for joint injection.

## 2012-07-24 NOTE — Assessment & Plan Note (Addendum)
BP Readings from Last 3 Encounters:  07/24/12 105/68  07/02/12 133/77  05/28/12 182/97    Lab Results  Component Value Date   NA 143 07/02/2012   K 4.6 07/02/2012   CREATININE 2.38* 07/02/2012    Assessment: Blood pressure control: controlled Progress toward BP goal:  at goal  Plan: Medications:  continue current medications Educational resources provided: brochure Other plans: Refill of her amlodipine was provided at her request.

## 2012-07-24 NOTE — Patient Instructions (Addendum)
Thank you for your visit. - Your pain likely represents arthritis or tenosynovitis - Please use your thumb brace every day. Overuse will make the pain worse. - Continue tylenol for pain control. - Will call call you with the results of your hand x-ray.

## 2012-07-25 NOTE — Progress Notes (Signed)
I saw patient and discussed her care with Dr. Lucila Maine at the time of the visit.  We reviewed the resident's history and exam and pertinent patient test results.  I agree with the assessment, diagnosis, and plan of care documented in the resident's note.

## 2012-09-24 ENCOUNTER — Encounter: Payer: Self-pay | Admitting: Internal Medicine

## 2012-09-24 ENCOUNTER — Ambulatory Visit (INDEPENDENT_AMBULATORY_CARE_PROVIDER_SITE_OTHER): Payer: Self-pay | Admitting: Internal Medicine

## 2012-09-24 VITALS — BP 125/77 | HR 64 | Temp 97.4°F | Ht 62.0 in | Wt 178.2 lb

## 2012-09-24 DIAGNOSIS — N184 Chronic kidney disease, stage 4 (severe): Secondary | ICD-10-CM

## 2012-09-24 DIAGNOSIS — I10 Essential (primary) hypertension: Secondary | ICD-10-CM

## 2012-09-24 DIAGNOSIS — M79644 Pain in right finger(s): Secondary | ICD-10-CM

## 2012-09-24 DIAGNOSIS — I129 Hypertensive chronic kidney disease with stage 1 through stage 4 chronic kidney disease, or unspecified chronic kidney disease: Secondary | ICD-10-CM

## 2012-09-24 DIAGNOSIS — M79609 Pain in unspecified limb: Secondary | ICD-10-CM

## 2012-09-24 NOTE — Patient Instructions (Signed)
General Instructions: Please schedule your screening mammogram. Follow up with Dr. Moshe Cipro next week as scheduled.   Treatment Goals:  Goals (1 Years of Data) as of 09/24/12         As of Today 07/24/12 07/02/12 05/28/12 05/14/12     Blood Pressure    . Blood Pressure < 140/90  125/77 105/68 133/77 182/97 105/68      Progress Toward Treatment Goals:  Treatment Goal 09/24/2012  Blood pressure at goal    Self Care Goals & Plans:  Self Care Goal 09/24/2012  Manage my medications -  Monitor my health -  Eat healthy foods -  Be physically active -  Meeting treatment goals maintain the current self-care plan       Care Management & Community Referrals:  Referral 09/24/2012  Referrals made for care management support none needed  Referrals made to community resources none

## 2012-09-24 NOTE — Assessment & Plan Note (Signed)
Assessment: Patient's pain has improved, and only occurs whenever she bumps her thumb.  It is located in the area of the base of the thumb when it does occur.  I think it is likely due to osteoarthritis; exam shows no evidence of infection or inflammation.  When it does occur, the pain is relieved by acetaminophen.  I discussed with patient the option of getting an x-ray and referring to sports medicine, but she declined; given her improvement, she would prefer not to pursue further workup or referral unless her symptoms worsen.  Plan: Patient will continue Tylenol as needed.  If her symptoms worsen, the plan is x-ray and refer to sports medicine.

## 2012-09-24 NOTE — Assessment & Plan Note (Signed)
BP Readings from Last 3 Encounters:  09/24/12 125/77  07/24/12 105/68  07/02/12 133/77    Lab Results  Component Value Date   NA 143 07/02/2012   K 4.6 07/02/2012   CREATININE 2.38* 07/02/2012    Assessment: Blood pressure control: controlled Progress toward BP goal:  at goal Comments: Blood pressure is well controlled on amlodipine 10 mg daily, atenolol 25 mg daily, and furosemide 40 mg daily.  Plan: Medications:  continue current medications

## 2012-09-24 NOTE — Progress Notes (Signed)
  Subjective:    Patient ID: [name], female    DOB: [birth year], 78 y.o.   MRN: [id number]  HPI Patient returns for followup of her hypertension, right thumb pain, chronic renal insufficiency, and other chronic medical problems.  She is doing well today, without any acute complaints.  She reports that her right thumb pain has improved since her last visit here; she has occasional pain at the base of her right thumb if she bumps that area, but otherwise does not have pain.  She did not get the x-ray ordered by Dr. Lucila Maine at the time of her last visit.  She reports that she is feeling well, [facility].  She has an appointment next week with her nephrologist Dr. Clover Mealy.  She reports that she received a letter to schedule her screening mammogram, but has not yet done so.  She declined a flu shot today, as she has done in the past.   Review of Systems  Constitutional: Negative for fever and chills.  Respiratory: Negative for cough and shortness of breath.   Cardiovascular: Negative for chest pain and leg swelling.  Gastrointestinal: Negative for nausea, vomiting, abdominal pain and blood in stool.  Genitourinary: Negative for dysuria, frequency and difficulty urinating.  Neurological: Negative for dizziness, syncope, weakness and numbness.       Objective:   Physical Exam  Constitutional: No distress.  Cardiovascular: Normal rate and normal heart sounds.  Exam reveals no gallop and no friction rub.   No murmur heard. Pulmonary/Chest: Effort normal and breath sounds normal. No respiratory distress. She has no wheezes. She has no rales.  Abdominal: Soft. Bowel sounds are normal. She exhibits no distension. There is no hepatosplenomegaly. There is no tenderness. There is no rebound and no guarding.  Musculoskeletal: She exhibits no edema.       Right wrist: She exhibits tenderness (Mild tenderness at base of thumb; no palpable abnormality; no warmth or  induration.).        Assessment & Plan:

## 2012-09-24 NOTE — Assessment & Plan Note (Signed)
Lab Results  Component Value Date   CREATININE 2.38* 07/02/2012   CREATININE 1.80* 05/28/2012   CREATININE 3.04* 05/14/2012     Wt Readings from Last 3 Encounters:  09/24/12 178 lb 3.2 oz (80.831 kg)  07/24/12 178 lb 4.8 oz (80.876 kg)  07/02/12 178 lb 3.2 oz (80.831 kg)    Assessment: Patient is doing well symptomatically; she reports no shortness of breath or leg edema; her weight is stable compared to her last visit.    Plan: Patient is scheduled to see Dr. Moshe Cipro next week, and would prefer to have her blood work drawn there.  She will have Dr. Moshe Cipro forward a copy of the results to me.

## 2012-11-15 ENCOUNTER — Other Ambulatory Visit: Payer: Self-pay | Admitting: Internal Medicine

## 2012-12-10 ENCOUNTER — Encounter: Payer: PRIVATE HEALTH INSURANCE | Admitting: Internal Medicine

## 2013-01-05 ENCOUNTER — Ambulatory Visit: Payer: PRIVATE HEALTH INSURANCE | Admitting: Internal Medicine

## 2013-01-07 ENCOUNTER — Ambulatory Visit (INDEPENDENT_AMBULATORY_CARE_PROVIDER_SITE_OTHER): Payer: PRIVATE HEALTH INSURANCE | Admitting: Internal Medicine

## 2013-01-07 ENCOUNTER — Encounter: Payer: Self-pay | Admitting: Internal Medicine

## 2013-01-07 VITALS — BP 137/81 | HR 68 | Temp 98.2°F | Ht 62.0 in | Wt 177.5 lb

## 2013-01-07 DIAGNOSIS — I129 Hypertensive chronic kidney disease with stage 1 through stage 4 chronic kidney disease, or unspecified chronic kidney disease: Secondary | ICD-10-CM

## 2013-01-07 DIAGNOSIS — D509 Iron deficiency anemia, unspecified: Secondary | ICD-10-CM

## 2013-01-07 DIAGNOSIS — H9319 Tinnitus, unspecified ear: Secondary | ICD-10-CM

## 2013-01-07 DIAGNOSIS — M79644 Pain in right finger(s): Secondary | ICD-10-CM

## 2013-01-07 DIAGNOSIS — F329 Major depressive disorder, single episode, unspecified: Secondary | ICD-10-CM

## 2013-01-07 DIAGNOSIS — F3289 Other specified depressive episodes: Secondary | ICD-10-CM

## 2013-01-07 DIAGNOSIS — M79609 Pain in unspecified limb: Secondary | ICD-10-CM

## 2013-01-07 DIAGNOSIS — Z1239 Encounter for other screening for malignant neoplasm of breast: Secondary | ICD-10-CM

## 2013-01-07 DIAGNOSIS — I10 Essential (primary) hypertension: Secondary | ICD-10-CM

## 2013-01-07 DIAGNOSIS — N184 Chronic kidney disease, stage 4 (severe): Secondary | ICD-10-CM

## 2013-01-07 HISTORY — DX: Tinnitus, unspecified ear: H93.19

## 2013-01-07 LAB — CBC WITH DIFFERENTIAL/PLATELET
Basophils Absolute: 0 10*3/uL (ref 0.0–0.1)
Basophils Relative: 1 % (ref 0–1)
Eosinophils Absolute: 0.2 10*3/uL (ref 0.0–0.7)
Eosinophils Relative: 4 % (ref 0–5)
HCT: 36.1 % (ref 36.0–46.0)
Hemoglobin: 12.1 g/dL (ref 12.0–15.0)
Lymphocytes Relative: 23 % (ref 12–46)
Lymphs Abs: 1.1 10*3/uL (ref 0.7–4.0)
MCH: 30.8 pg (ref 26.0–34.0)
MCHC: 33.5 g/dL (ref 30.0–36.0)
MCV: 91.9 fL (ref 78.0–100.0)
Monocytes Absolute: 0.4 10*3/uL (ref 0.1–1.0)
Monocytes Relative: 8 % (ref 3–12)
Neutro Abs: 3.2 10*3/uL (ref 1.7–7.7)
Neutrophils Relative %: 64 % (ref 43–77)
Platelets: 228 10*3/uL (ref 150–400)
RBC: 3.93 MIL/uL (ref 3.87–5.11)
RDW: 14.7 % (ref 11.5–15.5)
WBC: 5 10*3/uL (ref 4.0–10.5)

## 2013-01-07 LAB — FERRITIN: Ferritin: 233 ng/mL (ref 10–291)

## 2013-01-07 LAB — COMPLETE METABOLIC PANEL WITH GFR
ALT: 12 U/L (ref 0–35)
AST: 16 U/L (ref 0–37)
Albumin: 4.2 g/dL (ref 3.5–5.2)
Alkaline Phosphatase: 147 U/L — ABNORMAL HIGH (ref 39–117)
BUN: 37 mg/dL — ABNORMAL HIGH (ref 6–23)
CO2: 26 mEq/L (ref 19–32)
Calcium: 8.3 mg/dL — ABNORMAL LOW (ref 8.4–10.5)
Chloride: 107 mEq/L (ref 96–112)
Creat: 2.39 mg/dL — ABNORMAL HIGH (ref 0.50–1.10)
GFR, Est African American: 22 mL/min — ABNORMAL LOW
GFR, Est Non African American: 19 mL/min — ABNORMAL LOW
Glucose, Bld: 85 mg/dL (ref 70–99)
Potassium: 4.5 mEq/L (ref 3.5–5.3)
Sodium: 141 mEq/L (ref 135–145)
Total Bilirubin: 0.4 mg/dL (ref 0.3–1.2)
Total Protein: 7.3 g/dL (ref 6.0–8.3)

## 2013-01-07 LAB — IRON: Iron: 49 ug/dL (ref 42–145)

## 2013-01-07 MED ORDER — OMEPRAZOLE 20 MG PO CPDR: 20.0000 mg | DELAYED_RELEASE_CAPSULE | Freq: Two times a day (BID) | ORAL | Status: DC

## 2013-01-07 MED ORDER — AMLODIPINE BESYLATE 10 MG PO TABS: 10.0000 mg | ORAL_TABLET | Freq: Every day | ORAL | Status: DC

## 2013-01-07 MED ORDER — FUROSEMIDE 80 MG PO TABS: 40.0000 mg | ORAL_TABLET | Freq: Every day | ORAL | Status: DC

## 2013-01-07 MED ORDER — MECLIZINE HCL 12.5 MG PO TABS: 12.5000 mg | ORAL_TABLET | Freq: Three times a day (TID) | ORAL | Status: DC | PRN

## 2013-01-07 MED ORDER — ATENOLOL 25 MG PO TABS: 25.0000 mg | ORAL_TABLET | Freq: Every day | ORAL | Status: DC

## 2013-01-07 MED ORDER — CINACALCET HCL 30 MG PO TABS: 30.0000 mg | ORAL_TABLET | Freq: Every day | ORAL | Status: DC

## 2013-01-07 MED ORDER — BUPROPION HCL ER (XL) 150 MG PO TB24: 150.0000 mg | ORAL_TABLET | Freq: Every day | ORAL | Status: DC

## 2013-01-07 NOTE — Assessment & Plan Note (Signed)
Hemoglobin  Date Value Range Status  05/14/2012 11.9* 12.0 - 15.0 g/dL Final  02/13/2012 12.3  12.0 - 15.0 g/dL Final  03/12/2011 12.2  12.0 - 15.0 g/dL Final     Assessment: Patient has no symptoms of anemia.  Plan: Check a CBC and iron studies today.

## 2013-01-07 NOTE — Progress Notes (Signed)
   Subjective:    Patient ID: [name], female    DOB: [birth year], 79 y.o.   MRN: [id number]  HPI Patient returns for followup of her hypertension, chronic kidney disease, and other chronic medical problems.  Her only complaint is that she has occasional bilateral popping noise in her years which lasts only a few seconds and then resolves.  She has no associated symptoms.  She denies any tinnitus, hearing loss, ear drainage, ear pain, or other problems. Otherwise she is doing well today, without any complaints.  She reports that she is compliant with her medications.  She will be traveling out of town in February for about a month, and requested that her prescriptions cover 2 months of medication so she will not run out while traveling.  She still has some occasional pain at the base of her right thumb, which he thinks is aggravated by carrying her cooking pots which are made of cast iron.   Review of Systems  Constitutional: Negative for fever, chills and diaphoresis.  Respiratory: Negative for cough, chest tightness, shortness of breath and wheezing.   Cardiovascular: Negative for chest pain and leg swelling.  Gastrointestinal: Negative for nausea, vomiting, abdominal pain, blood in stool and anal bleeding.  Genitourinary: Negative for dysuria, frequency and difficulty urinating.  Musculoskeletal: Negative for arthralgias and myalgias.  Neurological: Negative for syncope, weakness and numbness.       Objective:   Physical Exam  Constitutional: No distress.  HENT:  Head: Normocephalic and atraumatic.  Right Ear: Tympanic membrane, external ear and ear canal normal.  Left Ear: Tympanic membrane, external ear and ear canal normal.  Cardiovascular: Normal rate, regular rhythm and normal heart sounds.  Exam reveals no gallop and no friction rub.   No murmur heard. Pulmonary/Chest: Effort normal and breath sounds normal. No respiratory distress. She has no wheezes. She has no rales.    Abdominal: Soft. Bowel sounds are normal. She exhibits no distension. There is no tenderness. There is no rebound and no guarding.  Musculoskeletal: She exhibits no edema and no tenderness.       Assessment & Plan:  ````

## 2013-01-07 NOTE — Assessment & Plan Note (Signed)
BP Readings from Last 3 Encounters:  01/07/13 137/81  09/24/12 125/77  07/24/12 105/68    Lab Results  Component Value Date   NA 143 07/02/2012   K 4.6 07/02/2012   CREATININE 2.38* 07/02/2012    Assessment: Blood pressure control: controlled Progress toward BP goal:  at goal Comments: Blood pressure is well controlled on amlodipine 10 mg daily, atenolol 25 mg daily, and furosemide 40 mg daily.  Plan: Medications:  continue current medications

## 2013-01-07 NOTE — Assessment & Plan Note (Signed)
Assessment: Patient's pain has improved, although she still has occasional pain at the base of the thumb.  I again discussed with patient the option of getting an x-ray and referring to sports medicine, but she declined at this time.  Plan: Patient will continue Tylenol as needed.

## 2013-01-07 NOTE — Assessment & Plan Note (Signed)
Assessment: Patient reports occasional popping noise in her ears; her ear exam is completely normal, and she has no associated symptoms of hearing loss tinnitus, pain, or discharge.  Plan: I discussed with patient the option of referral to an ENT physician.  Since she will be traveling out of state in February, she declined at this time.  She agreed to let me know if her symptoms worsen or persist, in which case the plan would be referral to ENT.

## 2013-01-07 NOTE — Assessment & Plan Note (Signed)
Lab Results  Component Value Date   CREATININE 2.38* 07/02/2012   CREATININE 1.80* 05/28/2012   CREATININE 3.04* 05/14/2012     Wt Readings from Last 3 Encounters:  01/07/13 177 lb 8 oz (80.513 kg)  09/24/12 178 lb 3.2 oz (80.831 kg)  07/24/12 178 lb 4.8 oz (80.876 kg)    Assessment: Patient reports no shortness of breath or leg edema; her weight is stable compared to her last visit.  She is followed by Dr. Corliss Parish.  Plan: Check a comprehensive metabolic panel today; forward lab results to Dr. Moshe Cipro.

## 2013-01-07 NOTE — Assessment & Plan Note (Signed)
Assessment: Patient is doing well on bupropion XL 150 mg daily, with no symptoms of depression.  Plan: Continue bupropion XL 150 mg daily.

## 2013-01-07 NOTE — Patient Instructions (Addendum)
General Instructions: Use meclizine 12.5 mg one tablet three times a day as needed for dizziness.  An appointment has been requested for a screening mammogram.    Progress Toward Treatment Goals:  Treatment Goal 01/07/2013  Blood pressure at goal    Self Care Goals & Plans:  Self Care Goal 01/07/2013  Manage my medications -  Monitor my health -  Eat healthy foods -  Be physically active -  Meeting treatment goals maintain the current self-care plan    No flowsheet data found.   Care Management & Community Referrals:  Referral 01/07/2013  Referrals made for care management support none needed  Referrals made to community resources none

## 2013-01-29 ENCOUNTER — Ambulatory Visit: Payer: PRIVATE HEALTH INSURANCE

## 2013-02-02 ENCOUNTER — Ambulatory Visit: Payer: PRIVATE HEALTH INSURANCE

## 2013-02-18 ENCOUNTER — Encounter: Payer: PRIVATE HEALTH INSURANCE | Admitting: Internal Medicine

## 2013-03-17 ENCOUNTER — Other Ambulatory Visit: Payer: Self-pay | Admitting: Internal Medicine

## 2013-03-17 DIAGNOSIS — Z1231 Encounter for screening mammogram for malignant neoplasm of breast: Secondary | ICD-10-CM

## 2013-03-19 ENCOUNTER — Ambulatory Visit
Admission: RE | Admit: 2013-03-19 | Discharge: 2013-03-19 | Disposition: A | Discharge status: Home / Self Care | Payer: PRIVATE HEALTH INSURANCE | Source: Ambulatory Visit | Attending: Internal Medicine | Admitting: Internal Medicine

## 2013-03-19 ENCOUNTER — Other Ambulatory Visit: Payer: Self-pay | Admitting: Internal Medicine

## 2013-03-19 DIAGNOSIS — Z1231 Encounter for screening mammogram for malignant neoplasm of breast: Secondary | ICD-10-CM

## 2013-05-05 ENCOUNTER — Other Ambulatory Visit: Payer: Self-pay | Admitting: Podiatrist

## 2013-05-05 ENCOUNTER — Other Ambulatory Visit: Payer: Self-pay | Admitting: *Deleted

## 2013-05-07 MED ORDER — MECLIZINE HCL 12.5 MG PO TABS: 12.5000 mg | ORAL_TABLET | Freq: Three times a day (TID) | ORAL | Status: DC | PRN

## 2013-06-02 ENCOUNTER — Emergency Department (HOSPITAL_COMMUNITY)
Admission: EM | Admit: 2013-06-02 | Discharge: 2013-06-02 | Disposition: A | Discharge status: Home / Self Care | Payer: PRIVATE HEALTH INSURANCE | Attending: Emergency Medicine | Admitting: Emergency Medicine

## 2013-06-02 ENCOUNTER — Encounter (HOSPITAL_COMMUNITY): Payer: Self-pay | Admitting: Emergency Medicine

## 2013-06-02 DIAGNOSIS — F3289 Other specified depressive episodes: Secondary | ICD-10-CM | POA: Insufficient documentation

## 2013-06-02 DIAGNOSIS — Z87891 Personal history of nicotine dependence: Secondary | ICD-10-CM | POA: Insufficient documentation

## 2013-06-02 DIAGNOSIS — Z8739 Personal history of other diseases of the musculoskeletal system and connective tissue: Secondary | ICD-10-CM | POA: Diagnosis not present

## 2013-06-02 DIAGNOSIS — Z90710 Acquired absence of both cervix and uterus: Secondary | ICD-10-CM | POA: Insufficient documentation

## 2013-06-02 DIAGNOSIS — Z8619 Personal history of other infectious and parasitic diseases: Secondary | ICD-10-CM | POA: Diagnosis not present

## 2013-06-02 DIAGNOSIS — Z862 Personal history of diseases of the blood and blood-forming organs and certain disorders involving the immune mechanism: Secondary | ICD-10-CM | POA: Insufficient documentation

## 2013-06-02 DIAGNOSIS — F329 Major depressive disorder, single episode, unspecified: Secondary | ICD-10-CM | POA: Diagnosis not present

## 2013-06-02 DIAGNOSIS — Z79899 Other long term (current) drug therapy: Secondary | ICD-10-CM | POA: Diagnosis not present

## 2013-06-02 DIAGNOSIS — Z9889 Other specified postprocedural states: Secondary | ICD-10-CM | POA: Diagnosis not present

## 2013-06-02 DIAGNOSIS — R42 Dizziness and giddiness: Secondary | ICD-10-CM | POA: Diagnosis present

## 2013-06-02 DIAGNOSIS — Z8719 Personal history of other diseases of the digestive system: Secondary | ICD-10-CM | POA: Insufficient documentation

## 2013-06-02 DIAGNOSIS — Z8669 Personal history of other diseases of the nervous system and sense organs: Secondary | ICD-10-CM | POA: Insufficient documentation

## 2013-06-02 DIAGNOSIS — I10 Essential (primary) hypertension: Secondary | ICD-10-CM | POA: Insufficient documentation

## 2013-06-02 DIAGNOSIS — N390 Urinary tract infection, site not specified: Secondary | ICD-10-CM | POA: Diagnosis not present

## 2013-06-02 LAB — CBC
HCT: 35.6 % — ABNORMAL LOW (ref 36.0–46.0)
Hemoglobin: 11.9 g/dL — ABNORMAL LOW (ref 12.0–15.0)
MCH: 31.2 pg (ref 26.0–34.0)
MCHC: 33.4 g/dL (ref 30.0–36.0)
MCV: 93.2 fL (ref 78.0–100.0)
Platelets: 222 10*3/uL (ref 150–400)
RBC: 3.82 MIL/uL — ABNORMAL LOW (ref 3.87–5.11)
RDW: 13.5 % (ref 11.5–15.5)
WBC: 6.3 10*3/uL (ref 4.0–10.5)

## 2013-06-02 LAB — URINALYSIS, ROUTINE W REFLEX MICROSCOPIC
Bilirubin Urine: NEGATIVE
Glucose, UA: NEGATIVE mg/dL
Ketones, ur: NEGATIVE mg/dL
Nitrite: NEGATIVE
Protein, ur: 30 mg/dL — AB
Specific Gravity, Urine: 1.017 (ref 1.005–1.030)
Urobilinogen, UA: 0.2 mg/dL (ref 0.0–1.0)
pH: 5 (ref 5.0–8.0)

## 2013-06-02 LAB — URINE MICROSCOPIC-ADD ON

## 2013-06-02 LAB — I-STAT CHEM 8, ED
BUN: 40 mg/dL — ABNORMAL HIGH (ref 6–23)
Calcium, Ion: 1.17 mmol/L (ref 1.13–1.30)
Chloride: 105 mEq/L (ref 96–112)
Creatinine, Ser: 2.6 mg/dL — ABNORMAL HIGH (ref 0.50–1.10)
Glucose, Bld: 101 mg/dL — ABNORMAL HIGH (ref 70–99)
HCT: 40 % (ref 36.0–46.0)
Hemoglobin: 13.6 g/dL (ref 12.0–15.0)
Potassium: 3.8 mEq/L (ref 3.7–5.3)
Sodium: 145 mEq/L (ref 137–147)
TCO2: 24 mmol/L (ref 0–100)

## 2013-06-02 MED ORDER — DEXTROSE 5 % IV SOLN
1.0000 g | Freq: Once | INTRAVENOUS | Status: AC
  Administered 2013-06-02: 1 g via INTRAVENOUS
  Filled 2013-06-02: qty 10

## 2013-06-02 MED ORDER — CEPHALEXIN 500 MG PO CAPS: 500.0000 mg | ORAL_CAPSULE | Freq: Four times a day (QID) | ORAL | Status: DC

## 2013-06-02 NOTE — ED Notes (Addendum)
Pt states she has been dizzy since late last night.  Pt states she took her BP medication and laid down and felt a little better, but awoke this morning with increased dizziness again.  Pt states she was only on dialysis for three years, and had enough kidney function regained to no longer need dialysis

## 2013-06-02 NOTE — Discharge Instructions (Signed)
,  Please call your doctor for a followup appointment within 24-48 hours. When you talk to your doctor please let them know that you were seen in the emergency department and have them acquire all of your records so that they can discuss the findings with you and formulate a treatment plan to fully care for your new and ongoing problems.  Keflex 4 times a day for 7 days.

## 2013-06-02 NOTE — ED Provider Notes (Signed)
CSN: [id number]     Arrival date & time 06/02/13  0229 History   First MD Initiated Contact with Patient 06/02/13 0253     Chief Complaint  Patient presents with  . Dizziness     (Consider location/radiation/quality/duration/timing/severity/associated sxs/prior Treatment) HPI Comments: 79 year old female with a history of renal failure, prior dialysis patient who has had improvement of her kidneys and now makes good amounts of urine but the creatinine baseline of approximately 2. She presents to the hospital with a complaint of dizziness and lightheadedness.  The symptoms were acute in onset last night while she was sitting on the couch. They came on when she went from sitting to standing position. She felt very lightheaded, denied vertigo, denied nausea or vomiting. She states that she felt slightly off balance, this improved immediately when she sat down and rested. There was no change in her symptoms with movement of her head from side to side. She went to bed, trying to get up out of bed to use the bathroom and immediately felt like her symptoms returned. She denies chest pain, shortness of breath, palpitations, fever, chills, nausea, vomiting, diarrhea, swelling of the legs. She states that this is distinct and different than her prior vertigo. She did not feel as though she was going to pass out, there was no syncopal episode  Patient is a 79 y.o. female presenting with dizziness. The history is provided by the patient.  Dizziness   Past Medical History  Diagnosis Date  . Depression   . Hypertension   . Renal failure     Formerly on hemodialysis; managed by Dr. Corliss Parish  . Gastritis, Helicobacter pylori     Noted on EGD 01/18/2009 by [name];  biopsy showed chronic gastritis with Helicobacter pylori, treated by [name].  . Pericardial effusion 11/2004    S/P subxiphoid pericardial window 11/01/2004 by [name].  A question of possible collagen  vascular disease had been raised in 2006 due to arthralgias, a history of idiopathic pericarditis, and increased pigmentation of her palms, and she was referred to [name] but he was unable to confirm a rheumatologic diagnosis.  [name] Hemorrhoids, internal 05/09/2005     Found on colonoscopy 05/09/2005 by [name]  . Cataracts, bilateral   . Pinguecula   . Vitreous detachment   . Thigh pain   . Epicondylitis   . Anemia, iron deficiency   . Angiodysplasia of colon 05/09/2005    Single small angiodysplastic lesion in the descending colon found on colonoscopy 05/09/2005 by [name]  . Anemia due to blood loss, chronic   . Stiffness of joint, not elsewhere classified, hand   . S/P hysterectomy   . Osteoporosis, unspecified 03/06/2012    A DXA bone density scan done 02/20/2012 showed osteoporosis with a lumbar spine T-score of -4.4 and a right femur T-score of -2.8.  Patient has chronic kidney disease with estimated GFR less than 30, and it is not clear how much of her osteoporosis may be due to renal osteodystrophy.     Past Surgical History  Procedure Laterality Date  . Subxyphoid pericardial window  11/01/2004  . Arteriovenous graft placement  11/28/2005     Left forearm arteriovenous graft.  . Thrombectomy / arteriovenous graft revision  01/21/2006    Thrombectomy and revision of left forearm loop AV graft.  . Thrombectomy   03/06/2006    Simple thrombectomy arteriovenous Gore-Tex graft, left arm, with exploration of venous end and  intraoperative shuntogram.         . Hallux valgus correction  01/2012    Left foot   Family History  Problem Relation Age of Onset  . Hypertension Brother   . Heart disease Father   . Diabetes Brother   . Breast cancer Neg Hx   . Colon cancer Neg Hx   . Lung cancer Neg Hx   . Cervical cancer Neg Hx   . Sickle cell anemia Neg Hx    History  Substance Use Topics  . Smoking status: Former Smoker -- 0.10 packs/day for 1 years    Quit date:  01/02/1960  . Smokeless tobacco: Never Used  . Alcohol Use: No   OB History   Grav Para Term Preterm Abortions TAB SAB Ect Mult Living                 Review of Systems  Neurological: Positive for dizziness.  All other systems reviewed and are negative.     Allergies  Review of patient's allergies indicates no known allergies.  Home Medications   Prior to Admission medications   Medication Sig Start Date End Date Taking? Authorizing Provider  amLODipine (NORVASC) 10 MG tablet Take 1 tablet (10 mg total) by mouth at bedtime. 01/07/13  Yes Axel Filler, MD  atenolol (TENORMIN) 25 MG tablet Take 1 tablet (25 mg total) by mouth at bedtime. 01/07/13  Yes Axel Filler, MD  buPROPion (WELLBUTRIN XL) 150 MG 24 hr tablet Take 1 tablet (150 mg total) by mouth daily. 01/07/13  Yes Axel Filler, MD  cinacalcet (SENSIPAR) 30 MG tablet Take 1 tablet (30 mg total) by mouth daily. With meals. 01/07/13  Yes Axel Filler, MD  diphenhydramine-acetaminophen (TYLENOL PM) 25-500 MG TABS Take 2 tablets by mouth at bedtime as needed (sleep).    Yes Historical Provider, MD  furosemide (LASIX) 80 MG tablet Take 0.5 tablets (40 mg total) by mouth daily. 01/07/13  Yes Axel Filler, MD  meclizine (ANTIVERT) 12.5 MG tablet Take 1 tablet (12.5 mg total) by mouth 3 (three) times daily as needed for dizziness.   Yes Axel Filler, MD  Multiple Vitamins-Minerals (CENTRUM SILVER ADULT 50+ PO) Take 1 tablet by mouth daily.   Yes Historical Provider, MD  omeprazole (PRILOSEC) 20 MG capsule Take 1 capsule (20 mg total) by mouth 2 (two) times daily. 01/07/13  Yes Axel Filler, MD  sennosides-docusate sodium (SENOKOT-S) 8.6-50 MG tablet Take 1 tablet by mouth every other day.    Yes Historical Provider, MD  cephALEXin (KEFLEX) 500 MG capsule Take 1 capsule (500 mg total) by mouth 4 (four) times daily. 06/02/13   Johnna Acosta, MD   BP 147/80  Pulse 78  Temp(Src) 97.8 F (36.6 C)  Resp 14  SpO2  98% Physical Exam  Nursing note and vitals reviewed. Constitutional: She appears well-developed and well-nourished. No distress.  HENT:  Head: Normocephalic and atraumatic.  Mouth/Throat: Oropharynx is clear and moist. No oropharyngeal exudate.  Eyes: Conjunctivae and EOM are normal. Pupils are equal, round, and reactive to light. Right eye exhibits no discharge. Left eye exhibits no discharge. No scleral icterus.  Neck: Normal range of motion. Neck supple. No JVD present. No thyromegaly present.  Cardiovascular: Normal rate, regular rhythm, normal heart sounds and intact distal pulses.  Exam reveals no gallop and no friction rub.   No murmur heard. Pulmonary/Chest: Effort normal and breath sounds normal. No respiratory distress. She has no wheezes.  She has no rales.  Abdominal: Soft. Bowel sounds are normal. She exhibits no distension and no mass. There is no tenderness.  Musculoskeletal: Normal range of motion. She exhibits no edema and no tenderness.  Lymphadenopathy:    She has no cervical adenopathy.  Neurological: She is alert. Coordination normal.  Speech is clear, cranial nerves III through XII are intact, memory is intact, strength is normal in all 4 extremities including grips, sensation is intact to light touch and pinprick in all 4 extremities. Coordination as tested by finger-nose-finger is normal, no limb ataxia. Normal gait, normal reflexes at the patellar tendons bilaterally  Skin: Skin is warm and dry. No rash noted. No erythema.  Psychiatric: She has a normal mood and affect. Her behavior is normal.    ED Course  Procedures (including critical care time) Labs Review Labs Reviewed  CBC - Abnormal; Notable for the following:    RBC 3.82 (*)    Hemoglobin 11.9 (*)    HCT 35.6 (*)    All other components within normal limits  URINALYSIS, ROUTINE W REFLEX MICROSCOPIC - Abnormal; Notable for the following:    APPearance TURBID (*)    Hgb urine dipstick SMALL (*)     Protein, ur 30 (*)    Leukocytes, UA LARGE (*)    All other components within normal limits  URINE MICROSCOPIC-ADD ON - Abnormal; Notable for the following:    Squamous Epithelial / LPF FEW (*)    Bacteria, UA MANY (*)    All other components within normal limits  I-STAT CHEM 8, ED - Abnormal; Notable for the following:    BUN 40 (*)    Creatinine, Ser 2.60 (*)    Glucose, Bld 101 (*)    All other components within normal limits  URINE CULTURE    Imaging Review No results found.   EKG Interpretation   Date/Time:  Tuesday June 02 2013 02:38:11 EDT Ventricular Rate:  68 PR Interval:  160 QRS Duration: 100 QT Interval:  405 QTC Calculation: 431 R Axis:   12 Text Interpretation:  Sinus rhythm Normal ECG since last tracing no  significant change Confirmed by Ansen Sayegh  MD, Eymi Lipuma ([id number]) on 06/02/2013  2:42:12 AM      MDM   Final diagnoses:  Light headed  UTI (lower urinary tract infection)    The patient has a normal cardiac exam, her laboratory workup shows that she has renal insufficiency, it is not much different from January. Otherwise her labs appear within normal limits. Her neurologic exam is also normal, she has no headache, no visual changes and no difficulty speaking. She was able to ambulate to the bathroom with the nurse without any difficulty, she did not require any assistance and did not appear to have a wide-based or unstable gait. Orthostatic vital signs to be performed.  The pt has been given Rocephin for her UTI - she has no other acute findings and her labs are otherwise unremarkable - specifically I d/w the pt the need to have her renal function followed with the UTI - she has appointment in the AM which she is going to keep.  VS normal - no sig change with orthostatic VS.  Meds given in ED:  Medications  cefTRIAXone (ROCEPHIN) 1 g in dextrose 5 % 50 mL IVPB (1 g Intravenous New Bag/Given 06/02/13 0514)    New Prescriptions   CEPHALEXIN (KEFLEX) 500 MG  CAPSULE    Take 1 capsule (500 mg total) by mouth 4 (  four) times daily.      Johnna Acosta, MD 06/02/13 [phone]

## 2013-06-03 ENCOUNTER — Ambulatory Visit (INDEPENDENT_AMBULATORY_CARE_PROVIDER_SITE_OTHER): Payer: PRIVATE HEALTH INSURANCE | Admitting: Internal Medicine

## 2013-06-03 ENCOUNTER — Encounter: Payer: Self-pay | Admitting: Internal Medicine

## 2013-06-03 VITALS — BP 135/87 | HR 87 | Temp 97.9°F | Ht 62.0 in | Wt 173.9 lb

## 2013-06-03 DIAGNOSIS — F3289 Other specified depressive episodes: Secondary | ICD-10-CM

## 2013-06-03 DIAGNOSIS — I129 Hypertensive chronic kidney disease with stage 1 through stage 4 chronic kidney disease, or unspecified chronic kidney disease: Secondary | ICD-10-CM

## 2013-06-03 DIAGNOSIS — F329 Major depressive disorder, single episode, unspecified: Secondary | ICD-10-CM

## 2013-06-03 DIAGNOSIS — N184 Chronic kidney disease, stage 4 (severe): Secondary | ICD-10-CM

## 2013-06-03 DIAGNOSIS — A5901 Trichomonal vulvovaginitis: Secondary | ICD-10-CM

## 2013-06-03 DIAGNOSIS — R42 Dizziness and giddiness: Secondary | ICD-10-CM

## 2013-06-03 DIAGNOSIS — I10 Essential (primary) hypertension: Secondary | ICD-10-CM

## 2013-06-03 LAB — URINE CULTURE
Colony Count: NO GROWTH
Culture: NO GROWTH

## 2013-06-03 MED ORDER — METRONIDAZOLE 500 MG PO TABS: 2000.0000 mg | ORAL_TABLET | Freq: Once | ORAL | Status: DC

## 2013-06-03 NOTE — Assessment & Plan Note (Signed)
Assessment: Patient has no depressive symptoms and is doing well on bupropion XL 150 mg daily.  Plan: Continue bupropion XL 150 mg daily.

## 2013-06-03 NOTE — Assessment & Plan Note (Signed)
  Lab Results  Component Value Date   CREATININE 2.60* 06/02/2013   CREATININE 2.39* 01/07/2013   CREATININE 2.38* 07/02/2012   CREATININE 1.80* 05/28/2012   CREATININE 3.04* 05/14/2012      Assessment: Patient is followed by Dr. Corliss Parish.  Her creatinine is within the range of prior values.  Plan: Patient will follow up with Dr. Moshe Cipro.

## 2013-06-03 NOTE — Assessment & Plan Note (Signed)
Urine-Other  Date Value Ref Range Status  06/02/2013 FEW TRICHOMONAS   Final    Assessment: Patient's urinalysis yesterday was notable for Trichomonas; given her complaint of vaginal discharge, her symptoms likely represent Trichomonas vaginitis and possibly UTI.  I discussed with her the option of a pelvic exam for wet prep, but I do not feel this is necessary at this time given the finding on urinalysis and her symptoms.  If her symptoms do not clear with treatment of Trichomonas, then she will need a pelvic exam and wet prep.  Plan: Plan is to treat with metronidazole 2 g times one dose only.  I advised her that her partner should see a physician for evaluation and treatment as well.  She will return in 2 weeks if her symptoms do not clear completely, sooner if symptoms worsen.  Her urine culture was negative, but given her improvement in dizziness after starting antibacterial treatment for presumed urinary tract infection, I advised her to complete the course of cephalexin as prescribed in the emergency department.

## 2013-06-03 NOTE — Progress Notes (Signed)
   Subjective:    Patient ID: [name], female    DOB: 1934/02/17, 79 y.o.   MRN: [id number]  HPI Patient presents with complaint of vaginal discharge which started about one week ago, and with complaint of dizziness which prompted a visit to the emergency department yesterday 06/02/2013.  She denies any vaginal irritation, pelvic pain, abdominal pain, fever, chills, sweats, nausea, vomiting, or vaginal bleeding.  She denies vertigo, and denies any associated neurologic symptoms.  When she was seen in the emergency department, she was diagnosed with a urinary tract infection based on urinalysis; she was treated with a dose of ceftriaxone and a prescription for Keflex, which she has started taking.  She reports some improvement in her dizziness compared to yesterday.  Patient has no other complaints.  She reports that she has been compliant with her medications.  She reports that she is doing very well on her current dose of bupropion, and she denies any current symptoms of depression.   Review of Systems  Constitutional: Negative for fever, chills and diaphoresis.  Respiratory: Negative for cough and shortness of breath.   Cardiovascular: Negative for chest pain, palpitations and leg swelling.  Gastrointestinal: Positive for nausea (Mild). Negative for vomiting, abdominal pain, diarrhea and blood in stool.  Genitourinary: Positive for vaginal discharge. Negative for dysuria, frequency, hematuria, flank pain, vaginal bleeding, difficulty urinating, vaginal pain and pelvic pain.  Neurological: Positive for dizziness. Negative for syncope, facial asymmetry, speech difficulty, weakness and numbness.       Objective:   Physical Exam  Constitutional: She is oriented to person, place, and time. No distress.  Cardiovascular: Normal rate, regular rhythm and normal heart sounds.  Exam reveals no gallop and no friction rub.   No murmur heard. Pulmonary/Chest: Effort normal and breath sounds normal.  No respiratory distress. She has no wheezes. She has no rales.  Abdominal: Soft. Bowel sounds are normal. She exhibits no distension and no mass. There is no tenderness. There is no rebound and no guarding.  Neurological: She is alert and oriented to person, place, and time. She has normal strength. She displays no tremor. No cranial nerve deficit. Coordination and gait normal.        Assessment & Plan:

## 2013-06-03 NOTE — Assessment & Plan Note (Signed)
Assessment: Patient experienced dizziness yesterday that prompted a visit to the emergency room; she reports that this has improved although she has some mild symptoms.  She denies any vertigo, near syncope, or syncope; she denies any associated neurologic deficits.  Her neurologic exam is normal, and she is not orthostatic.  I think her symptoms are likely related to her vaginitis/UTI.  Plan: Treat the trichomonas infection; I advised patient to call or return if her symptoms worsen or persist.

## 2013-06-03 NOTE — Patient Instructions (Addendum)
General Instructions: Take metronidazole 500 mg four tablets as a single dose.    Progress Toward Treatment Goals:  Treatment Goal 06/03/2013  Blood pressure at goal    Self Care Goals & Plans:  Self Care Goal 06/03/2013  Manage my medications -  Monitor my health -  Eat healthy foods -  Be physically active -  Meeting treatment goals maintain the current self-care plan       Care Management & Community Referrals:  Referral 06/03/2013  Referrals made for care management support none needed  Referrals made to community resources none

## 2013-06-03 NOTE — Assessment & Plan Note (Signed)
BP Readings from Last 3 Encounters:  06/03/13 135/87  06/02/13 131/70  01/07/13 137/81    Lab Results  Component Value Date   NA 145 06/02/2013   K 3.8 06/02/2013   CREATININE 2.60* 06/02/2013    Assessment: Blood pressure control: controlled Progress toward BP goal:  at goal Comments: Doing well on amlodipine 10 mg daily, atenolol 25 mg daily, and furosemide 40 mg daily (one half of an 80 mg tablet)  Plan: Medications:  continue current medications

## 2013-06-10 ENCOUNTER — Encounter: Payer: PRIVATE HEALTH INSURANCE | Admitting: Internal Medicine

## 2013-06-17 ENCOUNTER — Ambulatory Visit (INDEPENDENT_AMBULATORY_CARE_PROVIDER_SITE_OTHER): Payer: Medicare Other | Admitting: Podiatrist

## 2013-06-17 ENCOUNTER — Ambulatory Visit: Payer: PRIVATE HEALTH INSURANCE | Admitting: Internal Medicine

## 2013-06-17 ENCOUNTER — Ambulatory Visit (INDEPENDENT_AMBULATORY_CARE_PROVIDER_SITE_OTHER): Payer: Medicare Other

## 2013-06-17 ENCOUNTER — Encounter: Payer: Self-pay | Admitting: Internal Medicine

## 2013-06-17 ENCOUNTER — Encounter: Payer: Self-pay | Admitting: Podiatrist

## 2013-06-17 VITALS — BP 141/77 | HR 66 | Resp 16

## 2013-06-17 DIAGNOSIS — L84 Corns and callosities: Secondary | ICD-10-CM

## 2013-06-17 DIAGNOSIS — M898X9 Other specified disorders of bone, unspecified site: Secondary | ICD-10-CM

## 2013-06-17 NOTE — Patient Instructions (Signed)
Corns and Calluses Corns are small areas of thickened skin that usually occur on the top, sides, or tip of a toe. They contain a cone-shaped core with a point that can press on a nerve below. This causes pain. Calluses are areas of thickened skin that usually develop on hands, fingers, palms, soles of the feet, and heels. These are areas that experience frequent friction or pressure. CAUSES  Corns are usually the result of rubbing (friction) or pressure from shoes that are too tight or do not fit properly. Calluses are caused by repeated friction and pressure on the affected areas. SYMPTOMS  A hard growth on the skin.  Pain or tenderness under the skin.  Sometimes, redness and swelling.  Increased discomfort while wearing tight-fitting shoes. DIAGNOSIS  Your caregiver can usually tell what the problem is by doing a physical exam. TREATMENT  Removing the cause of the friction or pressure is usually the only treatment needed. However, sometimes medicines can be used to help soften the hardened, thickened areas. These medicines include salicylic acid plasters and 12% ammonium lactate lotion. These medicines should only be used under the direction of your caregiver. HOME CARE INSTRUCTIONS   Try to remove pressure from the affected area.  You may wear donut-shaped corn pads to protect your skin.  You may use a pumice stone or nonmetallic nail file to gently reduce the thickness of a corn.  Wear properly fitted footwear.  If you have calluses on the hands, wear gloves during activities that cause friction.  If you have diabetes, you should regularly examine your feet. Tell your caregiver if you notice any problems with your feet. SEEK IMMEDIATE MEDICAL CARE IF:   You have increased pain, swelling, redness, or warmth in the affected area.  Your corn or callus starts to drain fluid or bleeds.  You are not getting better, even with treatment. Document Released: 09/24/2003 Document  Revised: 03/12/2011 Document Reviewed: 08/15/2010 Exit[employer] Patient Information 2015 [employer]. This information is not intended to replace advice given to you by your health care provider. Make sure you discuss any questions you have with your health care provider.  

## 2013-06-18 NOTE — Progress Notes (Signed)
Chief Complaint  Patient presents with  . Toe Pain    2nd toe right (medial side)  "I've got this corn or something growing here. Been like that for about 2 months."     HPI: Patient is 79 y.o. female who presents today for corn on the medial side of the right 2nd toe.  She has had it for 2 months-- we corrected her bunion last year and overall states this is doing well     Physical Exam GENERAL APPEARANCE: Alert, conversant. Appropriately groomed. No acute distress.  VASCULAR: Pedal pulses palpable at 2/4 DP and PT bilateral.  Capillary refill time is immediate to all digits,  Proximal to distal temperature is warm to warm.  Digital hair growth is present bilateral  NEUROLOGIC: sensation is intact epicritically and protectively to 5.07 monofilament at 5/5 sites bilateral.  Light touch is intact bilateral, vibratory sensation intact bilateral, achilles tendon reflex is intact bilateral.  MUSCULOSKELETAL: acceptable muscle strength, tone and stability bilateral.  Slight swelling of the right 2nd toe from a previous sprain injury is present.  Hallux is slightly laterally deviated and pressing against the 2nd toe. DERMATOLOGIC:  2 discreet porokeratotic lesions present on the medial aspect of the right 2nd toe.  No redness or swelling is present.    Assessment: corn on right 2nd toe  Plan:  Debrided lesion and applied padding.  I will recheck in 3 weeks.  May need to consider shaving down the medial side of the right 2nd toe.  Will take xrays at the next visit if it is still a problem

## 2013-08-26 ENCOUNTER — Encounter: Payer: Self-pay | Admitting: Podiatrist

## 2013-08-26 ENCOUNTER — Ambulatory Visit: Payer: Medicare Other | Admitting: Podiatrist

## 2013-08-26 ENCOUNTER — Ambulatory Visit (INDEPENDENT_AMBULATORY_CARE_PROVIDER_SITE_OTHER): Payer: Medicare Other | Admitting: Podiatrist

## 2013-08-26 VITALS — BP 139/67 | HR 65 | Resp 16

## 2013-08-26 DIAGNOSIS — L84 Corns and callosities: Secondary | ICD-10-CM

## 2013-08-26 DIAGNOSIS — M898X9 Other specified disorders of bone, unspecified site: Secondary | ICD-10-CM

## 2013-08-26 NOTE — Progress Notes (Signed)
Subjective: [name] presents today for follow up of porokeratosis medial side of 2nd digit right foot.  States it is better as long as she keeps it padded.   Objective:    GENERAL APPEARANCE: Alert, conversant. Appropriately groomed. No acute distress.  VASCULAR: Pedal pulses palpable at 2/4 DP and PT bilateral. Capillary refill time is immediate to all digits, Proximal to distal temperature is warm to warm. Digital hair growth is present bilateral  NEUROLOGIC: sensation is intact epicritically and protectively to 5.07 monofilament at 5/5 sites bilateral. Light touch is intact bilateral, vibratory sensation intact bilateral, achilles tendon reflex is intact bilateral.  MUSCULOSKELETAL: acceptable muscle strength, tone and stability bilateral. Slight swelling of the right 2nd toe from a previous sprain injury is present. Hallux is slightly laterally deviated and pressing against the 2nd toe.  DERMATOLOGIC:  Improved  porokeratotic lesions present on the medial aspect of the right 2nd toe. No redness or swelling is present.   Assessment: corn on right 2nd toe , laterally deviated hallux abutting right 2nd toe  Plan: Debrided lesion and recommended continued padding. We did discuss surgery should the area keep being bothersome-- would consider shaving the 2nd toe and possible akin to get the great toe off the 2nd .

## 2013-11-03 ENCOUNTER — Encounter: Payer: Self-pay | Admitting: Internal Medicine

## 2013-11-03 ENCOUNTER — Ambulatory Visit (INDEPENDENT_AMBULATORY_CARE_PROVIDER_SITE_OTHER): Payer: PRIVATE HEALTH INSURANCE | Admitting: Internal Medicine

## 2013-11-03 VITALS — BP 145/77 | HR 61 | Temp 98.4°F | Ht 62.0 in | Wt 170.6 lb

## 2013-11-03 DIAGNOSIS — I10 Essential (primary) hypertension: Secondary | ICD-10-CM

## 2013-11-03 DIAGNOSIS — N184 Chronic kidney disease, stage 4 (severe): Secondary | ICD-10-CM

## 2013-11-03 DIAGNOSIS — H9193 Unspecified hearing loss, bilateral: Secondary | ICD-10-CM

## 2013-11-03 DIAGNOSIS — H9190 Unspecified hearing loss, unspecified ear: Secondary | ICD-10-CM | POA: Insufficient documentation

## 2013-11-03 DIAGNOSIS — F329 Major depressive disorder, single episode, unspecified: Secondary | ICD-10-CM

## 2013-11-03 DIAGNOSIS — F32A Depression, unspecified: Secondary | ICD-10-CM

## 2013-11-03 DIAGNOSIS — M79645 Pain in left finger(s): Secondary | ICD-10-CM | POA: Insufficient documentation

## 2013-11-03 DIAGNOSIS — H9312 Tinnitus, left ear: Secondary | ICD-10-CM

## 2013-11-03 LAB — CBC WITH DIFFERENTIAL/PLATELET
Basophils Absolute: 0 10*3/uL (ref 0.0–0.1)
Basophils Relative: 1 % (ref 0–1)
Eosinophils Absolute: 0.2 10*3/uL (ref 0.0–0.7)
Eosinophils Relative: 5 % (ref 0–5)
HCT: 37.6 % (ref 36.0–46.0)
Hemoglobin: 12.1 g/dL (ref 12.0–15.0)
Lymphocytes Relative: 28 % (ref 12–46)
Lymphs Abs: 1.2 10*3/uL (ref 0.7–4.0)
MCH: 29.9 pg (ref 26.0–34.0)
MCHC: 32.2 g/dL (ref 30.0–36.0)
MCV: 92.8 fL (ref 78.0–100.0)
Monocytes Absolute: 0.4 10*3/uL (ref 0.1–1.0)
Monocytes Relative: 9 % (ref 3–12)
Neutro Abs: 2.4 10*3/uL (ref 1.7–7.7)
Neutrophils Relative %: 57 % (ref 43–77)
Platelets: 198 10*3/uL (ref 150–400)
RBC: 4.05 MIL/uL (ref 3.87–5.11)
RDW: 14.7 % (ref 11.5–15.5)
WBC: 4.2 10*3/uL (ref 4.0–10.5)

## 2013-11-03 NOTE — Assessment & Plan Note (Signed)
Assessment: Patient reports that she is doing well with no symptoms of depression on bupropion XL 150 mg daily.    Plan: Continue bupropion XL 150 mg daily.

## 2013-11-03 NOTE — Assessment & Plan Note (Addendum)
  Lab Results  Component Value Date   CREATININE 2.60* 06/02/2013   CREATININE 2.39* 01/07/2013   CREATININE 2.38* 07/02/2012   CREATININE 1.80* 05/28/2012   CREATININE 3.04* 05/14/2012      Assessment: Patient's renal function has been relatively stable off of hemodialysis.  Plan:  Patient will follow up with her nephrologist Dr. Corliss Parish.  Will check labs today including a comprehensive metabolic panel and CBC with differential.

## 2013-11-03 NOTE — Assessment & Plan Note (Signed)
Assessment: Patient complains of pain at the base of her left thumb which has been present for several months and has not improved with wearing of a wrist splint.  Exam is notable for tenderness in that area but no edema or warmth.  Plan: Obtain x-rays of left hand and wrist; refer to sports medicine for evaluation and treatment.

## 2013-11-03 NOTE — Assessment & Plan Note (Signed)
Assessment: As noted, patient reports chronic progressive hearing impairment, left greater than right, which has seemed worse over the past few months.  She also reports left-sided tinnitus.  She has some cerumen in her ear canals, but it is not obstructing the canals and I doubt that this accounts for her symptoms.  I offered irrigation for cerumen removal by our nurse, and patient will return tomorrow for this because she did not have time today.  Plan: Patient will return for irrigation to remove cerumen tomorrow.  Given her asymmetric hearing loss and tinnitus which are chronic and I do not think are due to cerumen, will refer to ENT for evaluation.

## 2013-11-03 NOTE — Progress Notes (Signed)
   Subjective:    Patient ID: [name], female    DOB: [birth year], 79 y.o.   MRN: [id number]  HPI Patient returns for follow-up management of her hypertension, chronic renal insufficiency, and other chronic medical problems.  Today her main complaint is bilateral hearing loss, left greater than right, which has gradually worsened; she also reports a constant high pitched noise in her left ear.  She denies any ear pain or drainage.  She also complains of pain at the base of her left forearm which has been bothering her for several months but has grown progressively worse.  She has been wearing a left wrist splint without significant relief.  Otherwise patient reports that she has been doing well.  Her depression is well controlled on her current dose of bupropion.  She reports that she forgot to take her medications including her blood pressure medications last night, and she usually takes these at night.  Her previously noted episodes of dizziness resolved completely.  She had no recurrent vaginal discharge or other vaginal symptoms following treatment for Trichomonas at the time of her last visit..  Review of Systems  HENT: Positive for hearing loss (Left greater than right) and tinnitus (Left). Negative for ear discharge and ear pain.   Respiratory: Negative for shortness of breath.   Cardiovascular: Negative for chest pain and leg swelling.  Gastrointestinal: Negative for nausea, vomiting, abdominal pain, blood in stool and anal bleeding.  Genitourinary: Negative for dysuria, frequency, vaginal discharge and difficulty urinating.  Neurological: Negative for dizziness.  Psychiatric/Behavioral: Negative for suicidal ideas and dysphoric mood.    I reviewed and updated the medication list, allergies, past medical history, past surgical history, family history, and social history.  Patient declined all immunizations as she has done in the past.     Objective:   Physical Exam    Constitutional: No distress.  HENT:  Head: Normocephalic and atraumatic.  Right Ear: External ear and ear canal normal. No drainage, swelling or tenderness. Tympanic membrane is not injected, not erythematous and not bulging.  Left Ear: External ear and ear canal normal. No drainage, swelling or tenderness. Tympanic membrane is not injected, not erythematous and not bulging.  Ears:  Cardiovascular: Normal rate, regular rhythm and normal heart sounds.  Exam reveals no gallop and no friction rub.   No murmur heard. Pulmonary/Chest: Effort normal and breath sounds normal. No respiratory distress. She has no wheezes. She has no rales.  Abdominal: Soft. Bowel sounds are normal. She exhibits no distension. There is no hepatosplenomegaly. There is no tenderness. There is no rebound and no guarding.  Musculoskeletal: She exhibits no edema.       Hands:         Assessment & Plan:

## 2013-11-03 NOTE — Patient Instructions (Signed)
A referral has been made to sports medicine for evaluation and treatment of left thumb pain. A referral has been made to an ear, nose and throat (ENT) specialist for evaluation and management of hearing loss and ringing in the ears.

## 2013-11-03 NOTE — Assessment & Plan Note (Signed)
BP Readings from Last 3 Encounters:  11/03/13 145/77  08/26/13 139/67  06/17/13 141/77    Lab Results  Component Value Date   NA 145 06/02/2013   K 3.8 06/02/2013   CREATININE 2.60* 06/02/2013    Assessment: Blood pressure control: mildly elevated Progress toward BP goal:  deteriorated Comments: blood pressure is mildly elevated today on a regimen of amlodipine 10 mg daily, atenolol 25 mg daily, and furosemide 40 mg daily; however, patient forgot to take her medications last night.  Plan: Medications:  continue current medications; I encouraged patient to take her medications regularly as prescribed Other plans: recheck blood pressure upon return and adjust regimen if indicated

## 2013-11-03 NOTE — Assessment & Plan Note (Signed)
Assessment: Patient reports chronic progressive hearing impairment, left greater than right, which has seemed worse over the past few months.  She also reports left-sided tinnitus.  She has some cerumen in her ear canals, but it is not obstructing the canals and I doubt that this accounts for her symptoms.  I offered irrigation for cerumen removal by our nurse, and patient will return tomorrow for this because she did not have time today.  Plan: Patient will return for irrigation to remove cerumen tomorrow.  Given her asymmetric hearing loss and tinnitus which are chronic and I do not think are due to cerumen, will refer to ENT for evaluation.

## 2013-11-04 LAB — COMPLETE METABOLIC PANEL WITH GFR
ALT: 10 U/L (ref 0–35)
AST: 14 U/L (ref 0–37)
Albumin: 3.9 g/dL (ref 3.5–5.2)
Alkaline Phosphatase: 112 U/L (ref 39–117)
BUN: 32 mg/dL — ABNORMAL HIGH (ref 6–23)
CO2: 24 mEq/L (ref 19–32)
Calcium: 9 mg/dL (ref 8.4–10.5)
Chloride: 106 mEq/L (ref 96–112)
Creat: 2.02 mg/dL — ABNORMAL HIGH (ref 0.50–1.10)
GFR, Est African American: 27 mL/min — ABNORMAL LOW
GFR, Est Non African American: 23 mL/min — ABNORMAL LOW
Glucose, Bld: 73 mg/dL (ref 70–99)
Potassium: 4.4 mEq/L (ref 3.5–5.3)
Sodium: 141 mEq/L (ref 135–145)
Total Bilirubin: 0.6 mg/dL (ref 0.2–1.2)
Total Protein: 6.6 g/dL (ref 6.0–8.3)

## 2013-11-12 ENCOUNTER — Ambulatory Visit (HOSPITAL_COMMUNITY)
Admission: RE | Admit: 2013-11-12 | Discharge: 2013-11-12 | Disposition: A | Discharge status: Home / Self Care | Payer: PRIVATE HEALTH INSURANCE | Source: Ambulatory Visit | Attending: Internal Medicine | Admitting: Internal Medicine

## 2013-11-12 DIAGNOSIS — M79642 Pain in left hand: Secondary | ICD-10-CM | POA: Insufficient documentation

## 2013-11-12 DIAGNOSIS — M79645 Pain in left finger(s): Secondary | ICD-10-CM

## 2013-11-12 DIAGNOSIS — M25532 Pain in left wrist: Secondary | ICD-10-CM | POA: Insufficient documentation

## 2013-11-20 ENCOUNTER — Ambulatory Visit: Payer: PRIVATE HEALTH INSURANCE | Admitting: Family Medicine

## 2013-11-30 NOTE — Addendum Note (Signed)
Addended by: Yvonna Alanis E on: 11/30/2013 11:56 AM   Modules accepted: Orders

## 2013-12-19 ENCOUNTER — Other Ambulatory Visit: Payer: Self-pay | Admitting: Internal Medicine

## 2014-01-17 ENCOUNTER — Encounter (HOSPITAL_COMMUNITY): Payer: Self-pay | Admitting: *Deleted

## 2014-01-17 ENCOUNTER — Emergency Department (HOSPITAL_COMMUNITY)
Admission: EM | Admit: 2014-01-17 | Discharge: 2014-01-17 | Disposition: A | Discharge status: Home / Self Care | Payer: Medicare Other | Attending: Emergency Medicine | Admitting: Emergency Medicine

## 2014-01-17 DIAGNOSIS — I10 Essential (primary) hypertension: Secondary | ICD-10-CM | POA: Insufficient documentation

## 2014-01-17 DIAGNOSIS — F329 Major depressive disorder, single episode, unspecified: Secondary | ICD-10-CM | POA: Diagnosis not present

## 2014-01-17 DIAGNOSIS — M25572 Pain in left ankle and joints of left foot: Secondary | ICD-10-CM | POA: Diagnosis not present

## 2014-01-17 DIAGNOSIS — N19 Unspecified kidney failure: Secondary | ICD-10-CM | POA: Insufficient documentation

## 2014-01-17 DIAGNOSIS — Z87891 Personal history of nicotine dependence: Secondary | ICD-10-CM | POA: Insufficient documentation

## 2014-01-17 DIAGNOSIS — K2970 Gastritis, unspecified, without bleeding: Secondary | ICD-10-CM | POA: Diagnosis not present

## 2014-01-17 DIAGNOSIS — Z8619 Personal history of other infectious and parasitic diseases: Secondary | ICD-10-CM | POA: Diagnosis not present

## 2014-01-17 DIAGNOSIS — Z862 Personal history of diseases of the blood and blood-forming organs and certain disorders involving the immune mechanism: Secondary | ICD-10-CM | POA: Diagnosis not present

## 2014-01-17 DIAGNOSIS — H269 Unspecified cataract: Secondary | ICD-10-CM | POA: Diagnosis not present

## 2014-01-17 DIAGNOSIS — Z79899 Other long term (current) drug therapy: Secondary | ICD-10-CM | POA: Insufficient documentation

## 2014-01-17 MED ORDER — HYDROCODONE-ACETAMINOPHEN 5-325 MG PO TABS
1.0000 | ORAL_TABLET | Freq: Once | ORAL | Status: AC
  Administered 2014-01-17: 1 via ORAL
  Filled 2014-01-17: qty 1

## 2014-01-17 MED ORDER — TRAMADOL HCL 50 MG PO TABS: 50.0000 mg | ORAL_TABLET | Freq: Four times a day (QID) | ORAL | Status: DC | PRN

## 2014-01-17 NOTE — ED Provider Notes (Signed)
CSN: [id number]     Arrival date & time 01/17/14  0827 History  This chart was scribed for Domenic Moras, PA-C, working with Arbie Cookey, * by Steva Colder, ED Scribe. The patient was seen in room TR06C/TR06C at 9:07 AM.    Chief Complaint  Patient presents with  . Ankle Pain     The history is provided by the patient. No language interpreter was used.    HPI Comments: [name] is a 80 y.o. female with a medical hx of HTN who presents to the Emergency Department complaining of left ankle pain onset yesterday afternoon. She noticed it when she was walking around the house and she had sharp pain to the ankle. She reports that she is active and takes an aerobics class. She denies any fall or injury to the area. She states that she is having associated symptoms of joint swelling. She states that she has tried Tylenol with no relief for her symptoms. She denies SOB, and any other symptoms. She notes that she had surgery on her left ankle in 2014. She denies hx of gout. No numbness or weakness.   Past Medical History  Diagnosis Date  . Depression   . Hypertension   . Renal failure     Formerly on hemodialysis; managed by Dr. Corliss Parish  . Gastritis, Helicobacter pylori     Noted on EGD 01/18/2009 by [name];  biopsy showed chronic gastritis with Helicobacter pylori, treated by [name].  . Pericardial effusion 11/2004    S/P subxiphoid pericardial window 11/01/2004 by [name].  A question of possible collagen vascular disease had been raised in 2006 due to arthralgias, a history of idiopathic pericarditis, and increased pigmentation of her palms, and she was referred to [name] but he was unable to confirm a rheumatologic diagnosis.  [name] Hemorrhoids, internal 05/09/2005     Found on colonoscopy 05/09/2005 by [name]  . Cataracts, bilateral   . Pinguecula   . Vitreous detachment   . Thigh pain   . Epicondylitis   . Anemia, iron  deficiency   . Angiodysplasia of colon 05/09/2005    Single small angiodysplastic lesion in the descending colon found on colonoscopy 05/09/2005 by [name]  . Anemia due to blood loss, chronic   . Stiffness of joint, not elsewhere classified, hand   . S/P hysterectomy   . Osteoporosis, unspecified 03/06/2012    A DXA bone density scan done 02/20/2012 showed osteoporosis with a lumbar spine T-score of -4.4 and a right femur T-score of -2.8.  Patient has chronic kidney disease with estimated GFR less than 30, and it is not clear how much of her osteoporosis may be due to renal osteodystrophy.     Past Surgical History  Procedure Laterality Date  . Subxyphoid pericardial window  11/01/2004  . Arteriovenous graft placement  11/28/2005     Left forearm arteriovenous graft.  . Thrombectomy / arteriovenous graft revision  01/21/2006    Thrombectomy and revision of left forearm loop AV graft.  . Thrombectomy   03/06/2006    Simple thrombectomy arteriovenous Gore-Tex graft, left arm, with exploration of venous end and intraoperative shuntogram.         . Hallux valgus correction  01/2012    Left foot   Family History  Problem Relation Age of Onset  . Hypertension Brother   . Heart disease Father   . Diabetes Brother   . Breast cancer  Neg Hx   . Colon cancer Neg Hx   . Lung cancer Neg Hx   . Cervical cancer Neg Hx   . Sickle cell anemia Neg Hx    History  Substance Use Topics  . Smoking status: Former Smoker -- 0.10 packs/day for 1 years    Quit date: 01/02/1960  . Smokeless tobacco: Never Used  . Alcohol Use: No   OB History    No data available     Review of Systems  Respiratory: Negative for shortness of breath.   Musculoskeletal: Positive for myalgias, joint swelling and arthralgias.      Allergies  Ibuprofen  Home Medications   Prior to Admission medications   Medication Sig Start Date End Date Taking? Authorizing Provider  amLODipine (NORVASC) 10 MG tablet  Take 1 tablet (10 mg total) by mouth at bedtime. 01/07/13   Axel Filler, MD  atenolol (TENORMIN) 25 MG tablet Take 1 tablet (25 mg total) by mouth at bedtime. 01/07/13   Axel Filler, MD  buPROPion (WELLBUTRIN XL) 150 MG 24 hr tablet Take 1 tablet (150 mg total) by mouth daily. 12/21/13   Axel Filler, MD  cinacalcet (SENSIPAR) 30 MG tablet Take 1 tablet (30 mg total) by mouth daily. With meals. 01/07/13   Axel Filler, MD  furosemide (LASIX) 80 MG tablet Take 0.5 tablets (40 mg total) by mouth daily. 01/07/13   Axel Filler, MD  Multiple Vitamins-Minerals (CENTRUM SILVER ADULT 50+ PO) Take 1 tablet by mouth daily.    Historical Provider, MD  omeprazole (PRILOSEC) 20 MG capsule Take 1 capsule (20 mg total) by mouth 2 (two) times daily. 01/07/13   Axel Filler, MD  sennosides-docusate sodium (SENOKOT-S) 8.6-50 MG tablet Take 1 tablet by mouth every other day as needed for constipation.     Historical Provider, MD   BP 153/88 mmHg  Pulse 76  Temp(Src) 98.2 F (36.8 C) (Oral)  Resp 18  SpO2 95%  Physical Exam  Constitutional: She is oriented to person, place, and time. She appears well-developed and well-nourished. No distress.  HENT:  Head: Normocephalic and atraumatic.  Eyes: EOM are normal.  Neck: Neck supple. No tracheal deviation present.  Cardiovascular: Normal rate.   Pulmonary/Chest: Effort normal. No respiratory distress.  Musculoskeletal: Normal range of motion.       Left ankle: She exhibits swelling. She exhibits normal range of motion. Tenderness. Achilles tendon normal.  Left ankle: Swelling to the lateral aspect of the left ankle. No obvious deformity. Pain on palpation of the lateral aspect at a surgical site. Pulse and sensation intact. Full ROM with passive and active motion. Achilles tendon intact. NVI.   Neurological: She is alert and oriented to person, place, and time.  Skin: Skin is warm and dry.  Psychiatric: She has a normal mood and affect. Her  behavior is normal.  Nursing note and vitals reviewed.   ED Course  Procedures (including critical care time) DIAGNOSTIC STUDIES: Oxygen Saturation is 95% on room air, adequate by my interpretation.    COORDINATION OF CARE: 9:10 AM-Discussed treatment plan which includes Tramadol, F/U with PCP, Referral to Orthopedist, Rest, elevate, Warm soaks with pt at bedside and pt agreed to plan.   9:09 AM- low suspicion of DVT given her pain and no significant hx. Pt has previous surgery to the ankle, thus given the possibility of arthritis for her pain. No evidence of infection to suggest septic arthritis.    Labs Review Labs  Reviewed - No data to display  Imaging Review No results found.   EKG Interpretation None      MDM   Final diagnoses:  Left lateral ankle pain    BP 153/88 mmHg  Pulse 76  Temp(Src) 98.2 F (36.8 C) (Oral)  Resp 18  SpO2 95%   I personally performed the services described in this documentation, which was scribed in my presence. The recorded information has been reviewed and is accurate.    Domenic Moras, PA-C 01/17/14 [place], MD 01/18/14 [id number]

## 2014-01-17 NOTE — ED Notes (Signed)
Pt reports left ankle pain since yesterday, denies fall or injury. Mild swelling noted.

## 2014-01-17 NOTE — Discharge Instructions (Signed)
Ankle Pain Ankle pain is a common symptom. The bones, cartilage, tendons, and muscles of the ankle joint perform a lot of work each day. The ankle joint holds your body weight and allows you to move around. Ankle pain can occur on either side or back of 1 or both ankles. Ankle pain may be sharp and burning or dull and aching. There may be tenderness, stiffness, redness, or warmth around the ankle. The pain occurs more often when a person walks or puts pressure on the ankle. CAUSES  There are many reasons ankle pain can develop. It is important to work with your caregiver to identify the cause since many conditions can impact the bones, cartilage, muscles, and tendons. Causes for ankle pain include:  Injury, including a break (fracture), sprain, or strain often due to a fall, sports, or a high-impact activity.  Swelling (inflammation) of a tendon (tendonitis).  Achilles tendon rupture.  Ankle instability after repeated sprains and strains.  Poor foot alignment.  Pressure on a nerve (tarsal tunnel syndrome).  Arthritis in the ankle or the lining of the ankle.  Crystal formation in the ankle (gout or pseudogout). DIAGNOSIS  A diagnosis is based on your medical history, your symptoms, results of your physical exam, and results of diagnostic tests. Diagnostic tests may include X-ray exams or a computerized magnetic scan (magnetic resonance imaging, MRI). TREATMENT  Treatment will depend on the cause of your ankle pain and may include:  Keeping pressure off the ankle and limiting activities.  Using crutches or other walking support (a cane or brace).  Using rest, ice, compression, and elevation.  Participating in physical therapy or home exercises.  Wearing shoe inserts or special shoes.  Losing weight.  Taking medications to reduce pain or swelling or receiving an injection.  Undergoing surgery. HOME CARE INSTRUCTIONS   Only take over-the-counter or prescription medicines for  pain, discomfort, or fever as directed by your caregiver.  Put ice on the injured area.  Put ice in a plastic bag.  Place a towel between your skin and the bag.  Leave the ice on for 15-20 minutes at a time, 03-04 times a day.  Keep your leg raised (elevated) when possible to lessen swelling.  Avoid activities that cause ankle pain.  Follow specific exercises as directed by your caregiver.  Record how often you have ankle pain, the location of the pain, and what it feels like. This information may be helpful to you and your caregiver.  Ask your caregiver about returning to work or sports and whether you should drive.  Follow up with your caregiver for further examination, therapy, or testing as directed. SEEK MEDICAL CARE IF:   Pain or swelling continues or worsens beyond 1 week.  You have an oral temperature above 102 F (38.9 C).  You are feeling unwell or have chills.  You are having an increasingly difficult time with walking.  You have loss of sensation or other new symptoms.  You have questions or concerns. MAKE SURE YOU:   Understand these instructions.  Will watch your condition.  Will get help right away if you are not doing well or get worse. Document Released: 06/07/2009 Document Revised: 03/12/2011 Document Reviewed: 06/07/2009 [facility] Patient Information 2015 [place], [place]. This information is not intended to replace advice given to you by your health care provider. Make sure you discuss any questions you have with your health care provider. Elastic Bandage and RICE Elastic bandages come in different shapes and sizes. They perform  different functions. Your caregiver will help you to decide what is best for your protection, recovery, or rehabilitation following an injury. The following are some general tips to help you use an elastic bandage.  Use the bandage as directed by the maker of the bandage you are using.  Do not wrap it too tight. This may cut  off the circulation of the arm or leg below the bandage.  If part of your body beyond the bandage becomes blue, numb, or swollen, it is too tight. Loosen the bandage as needed to prevent these problems.  See your caregiver or trainer if the bandage seems to be making your problems worse rather than better. Bandages may be a reminder to you that you have an injury. However, they provide very little support. The few pounds of support they provide are minor considering the pressure it takes to injure a joint or tear ligaments. Therefore, the joint will not be able to handle all of the wear and tear it could before the injury. The routine care of many injuries includes Rest, Ice, Compression, and Elevation (RICE).  Rest is required to allow your body to heal. Generally, routine activities can be resumed when comfortable. Injured tendons and bones take about 6 weeks to heal.  Icing the injury helps keep the swelling down and reduces pain. Do not apply ice directly to the skin. Put ice in a plastic bag. Place a towel between the skin and the bag. This will prevent frostbite to the skin. Apply ice bags to the injured area for 15-20 minutes, every 2 hours while awake. Do this for the first 24 to 48 hours, then as directed by your caregiver.  Compression helps keep swelling down, gives support, and helps with discomfort. If an elastic bandage has been applied today, it should be removed and reapplied every 3 to 4 hours. It should not be applied tightly, but firmly enough to keep swelling down. Watch fingers or toes for swelling, bluish discoloration, coldness, numbness, or increased pain. If any of these problems occur, remove the bandage and reapply it more loosely. If these problems persist, contact your caregiver.  Elevation helps reduce swelling and decreases pain. The injured area (arms, hands, legs, or feet) should be placed near to or above the heart (center of the chest) if able. Persistent pain and  inability to use the injured area for more than 2 to 3 days are warning signs. You should see a caregiver for a follow-up visit as soon as possible. Initially, a minor broken bone (hairline fracture) may not be seen on X-rays. It may take 7 to 10 days to finally show up. Continued pain and swelling show that further evaluation and/or X-rays are needed. Make a follow-up visit with your caregiver. A specialist in reading X-rays (radiologist) will read your X-rays again. Finding out the results of your test Not all test results are available during your visit. If your test results are not back during the visit, make an appointment with your caregiver to find out the results. Do not assume everything is normal if you have not heard from your caregiver or the medical facility. It is important for you to follow up on all of your test results. Document Released: 06/09/2001 Document Revised: 03/12/2011 Document Reviewed: 04/21/2007 [facility] Patient Information 2015 [place], [place]. This information is not intended to replace advice given to you by your health care provider. Make sure you discuss any questions you have with your health care provider.

## 2014-01-20 ENCOUNTER — Ambulatory Visit (INDEPENDENT_AMBULATORY_CARE_PROVIDER_SITE_OTHER): Payer: Medicare Other | Admitting: Internal Medicine

## 2014-01-20 ENCOUNTER — Ambulatory Visit (HOSPITAL_COMMUNITY)
Admission: RE | Admit: 2014-01-20 | Discharge: 2014-01-20 | Disposition: A | Discharge status: Home / Self Care | Payer: Medicare Other | Source: Ambulatory Visit | Attending: Oncology | Admitting: Oncology

## 2014-01-20 ENCOUNTER — Encounter: Payer: Self-pay | Admitting: Internal Medicine

## 2014-01-20 VITALS — BP 143/88 | HR 76 | Temp 98.1°F | Ht 62.0 in

## 2014-01-20 DIAGNOSIS — M25571 Pain in right ankle and joints of right foot: Secondary | ICD-10-CM | POA: Insufficient documentation

## 2014-01-20 DIAGNOSIS — M7989 Other specified soft tissue disorders: Secondary | ICD-10-CM | POA: Insufficient documentation

## 2014-01-20 DIAGNOSIS — M25579 Pain in unspecified ankle and joints of unspecified foot: Secondary | ICD-10-CM | POA: Diagnosis not present

## 2014-01-20 DIAGNOSIS — M25572 Pain in left ankle and joints of left foot: Secondary | ICD-10-CM | POA: Diagnosis not present

## 2014-01-20 DIAGNOSIS — M25472 Effusion, left ankle: Secondary | ICD-10-CM | POA: Diagnosis not present

## 2014-01-20 DIAGNOSIS — I10 Essential (primary) hypertension: Secondary | ICD-10-CM

## 2014-01-20 DIAGNOSIS — M25471 Effusion, right ankle: Secondary | ICD-10-CM | POA: Diagnosis not present

## 2014-01-20 MED ORDER — TRAMADOL HCL 50 MG PO TABS: 100.0000 mg | ORAL_TABLET | Freq: Two times a day (BID) | ORAL | Status: DC | PRN

## 2014-01-20 MED ORDER — HYDROCODONE-ACETAMINOPHEN 5-325 MG PO TABS: 1.0000 | ORAL_TABLET | Freq: Two times a day (BID) | ORAL | Status: DC | PRN

## 2014-01-20 NOTE — Addendum Note (Signed)
Addended by: Charlesetta Shanks on: 01/20/2014 04:52 PM   Modules accepted: Orders

## 2014-01-20 NOTE — Progress Notes (Signed)
   Subjective:    Patient ID: [name], female    DOB: [birth year], 80 y.o.   MRN: [id number]  HPI [name] is a 80 year old woman with history of HTN, CKD, depression, and hearing loss presenting with left ankle pain.  She was seen in the ER three days ago for sharp pain in her left ankle that she noticed while walking around the house.  No imaging was obtained, and she was given a prescription for tramadol.  She reports that she initially developed pain in her left ankle six days ago.  She describes the pain as sharp and progressively getting worse.  She says the pain is constant and worse with weight bearing.  She denies ankle swelling or warmth, fevers, chills, or rash.  She has been taking the Tramadol with mild relief of her pain, and Tylenol has not helped.  She had a hallux valgus correction of both feet performed in 01/14.   Review of Systems  Constitutional: Negative for fever, chills and appetite change.  HENT: Negative for congestion, rhinorrhea and sore throat.   Respiratory: Negative for shortness of breath.   Cardiovascular: Negative for chest pain.  Gastrointestinal: Negative for nausea, vomiting, abdominal pain, diarrhea and constipation.  Genitourinary: Negative for dysuria.  Musculoskeletal: Positive for arthralgias. Negative for myalgias and joint swelling.  Skin: Negative for rash.  Neurological: Negative for dizziness, weakness, light-headedness and numbness.       Objective:   Physical Exam  Constitutional: She appears well-developed and well-nourished. No distress.  HENT:  Head: Normocephalic and atraumatic.  Eyes: Conjunctivae and EOM are normal. Pupils are equal, round, and reactive to light. No scleral icterus.  Cardiovascular: Normal rate, regular rhythm and normal heart sounds.   Pulmonary/Chest: Effort normal and breath sounds normal. No respiratory distress. She has no wheezes.  Abdominal: Soft. Bowel sounds are normal. She exhibits no  distension. There is no tenderness.  Musculoskeletal:  Trace edema of ankles bilaterally without large effusion.  No erythema or warmth.  Pain with weight bearing and movement of ankle along with palpation of the forefoot bilaterally L>R.  Skin: Skin is warm and dry.          Assessment & Plan:  Please see problem-based assessment and plan.

## 2014-01-20 NOTE — Assessment & Plan Note (Addendum)
Pain in bilateral ankles most consistent with arthritis pain that started in left ankle and has migrated to the right side due to compensation and increased weight bearing.  Symptoms not typical for gout, and though she has renal dysfunction, she does not have a history.  No concern for infection with stable vital signs.  She does have a history of renal osteodystrophy (last PTH elevated in 3/14), which puts her at risk for fracture.  She may benefit from starting calcium and vitamin D, but would need to recheck PTH, calcium and phosphorus prior to initiation. -X-rays of ankles bilaterally. -Increase tramadol to 100 mg q12h, #30. -If tramadol does not work, can use Vicodin 5-325 mg q12h PRN as well, alternating with tramadol.  Provided prescription for #30 tabs. -Will await x-ray results and refer to ortho for fracture vs. Sports medicine to pursue possible steroid injections. -Consider re-checking PTH, Ca, P at next appointment and possibly starting vitamin D and calcium.  --Addendum-- Arman Filter, MD, PhD Internal Medicine Intern Pager: [phone] 01/20/2014,4:50 PM  No evidence of fracture.  Only mild soft tissue swelling. -Placed referral to sports medicine.

## 2014-01-20 NOTE — Patient Instructions (Addendum)
Thank you for coming to clinic today [name].  General instructions: -We would like to get some x-rays of your ankles today to make sure your don't have a fracture and to look for arthritis. -I would like you to try to increase your tramadol dose to 100 mg every 12 hours.  I also wrote you a prescription for Vicodin that you can take every 12 hours as well.  You can alternate between the two medications every 6 hours if the increased tramadol doesn't control your pain. -Based on the x-ray results, I will refer you to either Sports Medicine or Orthopedics for further evaluation. -Please make a follow up appointment to return to clinic in 2 weeks if your ankle pain doesn't get better.  Please bring your medicines with you each time you come.   Medicines may be  Eye drops  Herbal   Vitamins  Pills  Seeing these help [place] take care of you.

## 2014-01-20 NOTE — Assessment & Plan Note (Signed)
BP Readings from Last 3 Encounters:  01/20/14 143/88  01/17/14 153/88  11/03/13 145/77    Lab Results  Component Value Date   NA 141 11/03/2013   K 4.4 11/03/2013   CREATININE 2.02* 11/03/2013    Assessment: Blood pressure control: mildly elevated Progress toward BP goal:  Improved. Comments: Suspect pain could be contributing, will not modify medication currently.  Plan: Medications:  continue current medications: amlodipine 10 mg daily, atenolol 25 mg daily, Lasix 40 mg daily.

## 2014-01-25 ENCOUNTER — Telehealth: Payer: Self-pay | Admitting: *Deleted

## 2014-01-25 NOTE — Telephone Encounter (Signed)
Pt would

## 2014-01-25 NOTE — Progress Notes (Signed)
Case discussed with Dr. Moding at time of visit. We reviewed the resident's history and exam and pertinent patient test results. I agree with the assessment, diagnosis, and plan of care documented in the resident's note. 

## 2014-01-28 ENCOUNTER — Ambulatory Visit: Payer: Medicare Other | Admitting: Internal Medicine

## 2014-01-29 ENCOUNTER — Other Ambulatory Visit: Payer: Self-pay | Admitting: *Deleted

## 2014-01-29 DIAGNOSIS — M25579 Pain in unspecified ankle and joints of unspecified foot: Secondary | ICD-10-CM

## 2014-01-29 MED ORDER — TRAMADOL HCL 50 MG PO TABS: 100.0000 mg | ORAL_TABLET | Freq: Two times a day (BID) | ORAL | Status: DC | PRN

## 2014-01-29 NOTE — Telephone Encounter (Signed)
Pt has appointment at sports med on 2/22.  She is asking for enough Tramadol to last until that appointment.  Directions are take 2 every 12 hours.

## 2014-01-29 NOTE — Telephone Encounter (Signed)
Prescribed tramadol 50 mg 2 tabs q12h PRN #30, which lasted her 9 days, which is appropriate.  She needs Tramadol for the next month to get her to the appointment.  Will prescribe #100 tabs (4 tabs daily for 25 days).  She should still have some Vicodin as well for break through pain.

## 2014-01-29 NOTE — Telephone Encounter (Signed)
Rx called in 

## 2014-02-01 ENCOUNTER — Ambulatory Visit: Payer: Medicare Other | Admitting: Internal Medicine

## 2014-02-03 ENCOUNTER — Ambulatory Visit: Payer: Medicare Other | Admitting: Internal Medicine

## 2014-02-09 ENCOUNTER — Encounter (HOSPITAL_COMMUNITY): Payer: Self-pay

## 2014-02-09 ENCOUNTER — Emergency Department (HOSPITAL_COMMUNITY): Payer: Medicare Other

## 2014-02-09 ENCOUNTER — Inpatient Hospital Stay (HOSPITAL_COMMUNITY)
Admission: EM | Admit: 2014-02-09 | Discharge: 2014-02-10 | DRG: 392 | Disposition: A | Discharge status: Home / Self Care | Payer: Medicare Other | Attending: Internal Medicine | Admitting: Internal Medicine

## 2014-02-09 DIAGNOSIS — R072 Precordial pain: Secondary | ICD-10-CM | POA: Diagnosis not present

## 2014-02-09 DIAGNOSIS — F329 Major depressive disorder, single episode, unspecified: Secondary | ICD-10-CM | POA: Diagnosis present

## 2014-02-09 DIAGNOSIS — Z87891 Personal history of nicotine dependence: Secondary | ICD-10-CM

## 2014-02-09 DIAGNOSIS — J439 Emphysema, unspecified: Secondary | ICD-10-CM | POA: Diagnosis not present

## 2014-02-09 DIAGNOSIS — K219 Gastro-esophageal reflux disease without esophagitis: Principal | ICD-10-CM | POA: Diagnosis present

## 2014-02-09 DIAGNOSIS — M810 Age-related osteoporosis without current pathological fracture: Secondary | ICD-10-CM | POA: Diagnosis present

## 2014-02-09 DIAGNOSIS — N184 Chronic kidney disease, stage 4 (severe): Secondary | ICD-10-CM | POA: Diagnosis not present

## 2014-02-09 DIAGNOSIS — R0789 Other chest pain: Secondary | ICD-10-CM | POA: Diagnosis not present

## 2014-02-09 DIAGNOSIS — R110 Nausea: Secondary | ICD-10-CM | POA: Diagnosis not present

## 2014-02-09 DIAGNOSIS — I129 Hypertensive chronic kidney disease with stage 1 through stage 4 chronic kidney disease, or unspecified chronic kidney disease: Secondary | ICD-10-CM | POA: Diagnosis present

## 2014-02-09 DIAGNOSIS — R079 Chest pain, unspecified: Secondary | ICD-10-CM | POA: Insufficient documentation

## 2014-02-09 DIAGNOSIS — R001 Bradycardia, unspecified: Secondary | ICD-10-CM | POA: Diagnosis not present

## 2014-02-09 HISTORY — DX: Gastro-esophageal reflux disease without esophagitis: K21.9

## 2014-02-09 HISTORY — DX: Unspecified osteoarthritis, unspecified site: M19.90

## 2014-02-09 LAB — I-STAT TROPONIN, ED
Troponin i, poc: 0.02 ng/mL (ref 0.00–0.08)
Troponin i, poc: 0.01 ng/mL (ref 0.00–0.08)

## 2014-02-09 LAB — BASIC METABOLIC PANEL
Anion gap: 11 (ref 5–15)
BUN: 23 mg/dL (ref 6–23)
CO2: 27 mmol/L (ref 19–32)
Calcium: 9.3 mg/dL (ref 8.4–10.5)
Chloride: 101 mmol/L (ref 96–112)
Creatinine, Ser: 2.35 mg/dL — ABNORMAL HIGH (ref 0.50–1.10)
GFR calc Af Amer: 22 mL/min — ABNORMAL LOW (ref >90)
GFR calc non Af Amer: 19 mL/min — ABNORMAL LOW (ref >90)
Glucose, Bld: 99 mg/dL (ref 70–99)
Potassium: 4.7 mmol/L (ref 3.5–5.1)
Sodium: 139 mmol/L (ref 135–145)

## 2014-02-09 LAB — CBC
HCT: 37.5 % (ref 36.0–46.0)
Hemoglobin: 12 g/dL (ref 12.0–15.0)
MCH: 30.3 pg (ref 26.0–34.0)
MCHC: 32 g/dL (ref 30.0–36.0)
MCV: 94.7 fL (ref 78.0–100.0)
Platelets: 263 10*3/uL (ref 150–400)
RBC: 3.96 MIL/uL (ref 3.87–5.11)
RDW: 13.1 % (ref 11.5–15.5)
WBC: 5.1 10*3/uL (ref 4.0–10.5)

## 2014-02-09 LAB — TROPONIN I
Troponin I: 0.16 ng/mL — ABNORMAL HIGH (ref <0.031)
Troponin I: <0.03 ng/mL (ref <0.031)

## 2014-02-09 MED ORDER — AMLODIPINE BESYLATE 10 MG PO TABS
10.0000 mg | ORAL_TABLET | Freq: Every day | ORAL | Status: DC
  Administered 2014-02-09: 10 mg via ORAL
  Filled 2014-02-09 (×2): qty 1

## 2014-02-09 MED ORDER — BUPROPION HCL ER (XL) 150 MG PO TB24
150.0000 mg | ORAL_TABLET | Freq: Every day | ORAL | Status: DC
Start: 2014-02-09 — End: 2014-02-10
  Administered 2014-02-09 – 2014-02-10 (×2): 150 mg via ORAL
  Filled 2014-02-09 (×2): qty 1

## 2014-02-09 MED ORDER — CINACALCET HCL 30 MG PO TABS
30.0000 mg | ORAL_TABLET | Freq: Every day | ORAL | Status: DC
  Administered 2014-02-10: 30 mg via ORAL
  Filled 2014-02-09 (×2): qty 1

## 2014-02-09 MED ORDER — CINACALCET HCL 30 MG PO TABS: 30.0000 mg | ORAL_TABLET | Freq: Every day | ORAL | Status: DC

## 2014-02-09 MED ORDER — ACETAMINOPHEN 650 MG RE SUPP: 650.0000 mg | Freq: Four times a day (QID) | RECTAL | Status: DC | PRN

## 2014-02-09 MED ORDER — ASPIRIN 81 MG PO CHEW
324.0000 mg | CHEWABLE_TABLET | Freq: Once | ORAL | Status: AC
Start: 2014-02-09 — End: 2014-02-09
  Administered 2014-02-09: 324 mg via ORAL
  Filled 2014-02-09: qty 4

## 2014-02-09 MED ORDER — ACETAMINOPHEN 325 MG PO TABS: 650.0000 mg | ORAL_TABLET | Freq: Four times a day (QID) | ORAL | Status: DC | PRN

## 2014-02-09 MED ORDER — SODIUM CHLORIDE 0.9 % IJ SOLN
3.0000 mL | Freq: Two times a day (BID) | INTRAMUSCULAR | Status: DC
  Administered 2014-02-09: 3 mL via INTRAVENOUS

## 2014-02-09 MED ORDER — HEPARIN SODIUM (PORCINE) 5000 UNIT/ML IJ SOLN
5000.0000 [IU] | Freq: Three times a day (TID) | INTRAMUSCULAR | Status: DC
  Administered 2014-02-09 – 2014-02-10 (×2): 5000 [IU] via SUBCUTANEOUS
  Filled 2014-02-09 (×4): qty 1

## 2014-02-09 MED ORDER — PANTOPRAZOLE SODIUM 40 MG PO TBEC
40.0000 mg | DELAYED_RELEASE_TABLET | Freq: Every day | ORAL | Status: DC
  Administered 2014-02-09 – 2014-02-10 (×2): 40 mg via ORAL
  Filled 2014-02-09 (×2): qty 1

## 2014-02-09 NOTE — ED Provider Notes (Signed)
Pt with intermittent CP over the last 8 hours - she has hx of same recently X 1 - no pain at this time.  Hx of prior tob use - Htn, ECG without acute findings, trop neg  on exam the patient has clear heart sounds, clear lung sounds, no peripheral edema.   EKG Interpretation  Date/Time:  Tuesday February 09 2014 11:10:52 EST Ventricular Rate:  72 PR Interval:  150 QRS Duration: 88 QT Interval:  404 QTC Calculation: 442 R Axis:   48 Text Interpretation:  Normal sinus rhythm Normal ECG since last tracing no significant change Confirmed by Alani Sabbagh  MD, Lillion Elbert ([id number]) on 02/09/2014 11:31:05 AM      The patient is elderly with ongoing intermittent chest pain, though she is pain-free at this time she would benefit from observational admission to rule out for serial troponins. The patient is in agreement with the plan.  Medical screening examination/treatment/procedure(s) were conducted as a shared visit with non-physician practitioner(s) and myself.  I personally evaluated the patient during the encounter.  Clinical Impression:   Final diagnoses:  Chest pain, unspecified chest pain type         Johnna Acosta, MD 02/10/14 2126

## 2014-02-09 NOTE — ED Notes (Signed)
Admitting MD at bedside.

## 2014-02-09 NOTE — ED Notes (Signed)
Pt given a meal.

## 2014-02-09 NOTE — H&P (Signed)
Date: 02/09/2014               Patient Name:  [name] MRN: [id number]  DOB: [birth year] Age / Sex: 80 y.o., female   PCP: Axel Filler, MD         Medical Service: Internal Medicine Teaching Service         Attending Physician: Dr. Scharlene Gloss    First Contact: Dr. Albin Felling Pager: [phone]  Second Contact: Dr. Juluis Mire Pager: [phone]       After Hours (After 5p/  First Contact Pager: [phone]  weekends / holidays): Second Contact Pager: [phone]   Chief Complaint: chest pain  History of Present Illness: [name] is a 80 year old woman with HTN, CKD stage 4, s/p pericardial effusion window 11/2004 who presents with chest pain. She had never had an episode like this until this past Saturday 2/6 when she at rest noted substernal non-radiating chest pain she described as pressure that lasted a few minutes and went away on its own. She decided not to go to the hospital and returned to normal health until this morning. She was at rest when she again had this pressure sensation and it lasted fifteen minutes. She reports associated nausea without emesis, diaphoresis and sometimes lightheadedness during these episodes. They are not related to exertion and she is unsure of any other aggravating or alleviating factors. She's never had this before.  She also notes that a few weeks ago she had R>L LE swelling. She was seen in 01/20/14 [facility] clinic for bilateral ankle pain thought to be related to arthritis with tramadol increased and a referral to sports medicine. This was after she was seen in the ED 1/17 for ankle pain and joint swelling. She notes she has been taking lasix 40 mg bid (prescribed daily) for a few weeks with no documentation that she was told to increase. She says her swelling has improved.  In the ED, she received ASA 324 mg po once.  Meds: Current Facility-Administered Medications  Medication Dose Route Frequency Provider Last Rate Last Dose  . acetaminophen  (TYLENOL) tablet 650 mg  650 mg Oral Q6H PRN Francesca Oman, DO       Or  . acetaminophen (TYLENOL) suppository 650 mg  650 mg Rectal Q6H PRN Francesca Oman, DO      . amLODipine (NORVASC) tablet 10 mg  10 mg Oral QHS Francesca Oman, DO      . buPROPion (WELLBUTRIN XL) 24 hr tablet 150 mg  150 mg Oral Daily Francesca Oman, DO      . cinacalcet [facility]) tablet 30 mg  30 mg Oral Daily Francesca Oman, DO      . heparin injection 5,000 Units  5,000 Units Subcutaneous 3 times per day Francesca Oman, DO      . pantoprazole (PROTONIX) EC tablet 40 mg  40 mg Oral Daily Francesca Oman, DO      . sodium chloride 0.9 % injection 3 mL  3 mL Intravenous Q12H Francesca Oman, DO       Current Outpatient Prescriptions  Medication Sig Dispense Refill  . amLODipine (NORVASC) 10 MG tablet Take 1 tablet (10 mg total) by mouth at bedtime. 60 tablet 5  . atenolol (TENORMIN) 25 MG tablet Take 1 tablet (25 mg total) by mouth at bedtime. 60 tablet 5  . buPROPion (WELLBUTRIN XL) 150 MG 24 hr tablet Take 1 tablet (150 mg total)  by mouth daily. 60 tablet 1  . cinacalcet (SENSIPAR) 30 MG tablet Take 1 tablet (30 mg total) by mouth daily. With meals. 60 tablet 5  . diphenhydramine-acetaminophen (TYLENOL PM) 25-500 MG TABS Take 1 tablet by mouth at bedtime as needed (sleep).    . furosemide (LASIX) 80 MG tablet Take 0.5 tablets (40 mg total) by mouth daily. 60 tablet 3  . HYDROcodone-acetaminophen (NORCO/VICODIN) 5-325 MG per tablet Take 1 tablet by mouth every 12 (twelve) hours as needed for moderate pain. 30 tablet 0  . meclizine (ANTIVERT) 12.5 MG tablet Take 12.5 mg by mouth 3 (three) times daily as needed for dizziness or nausea.    . Multiple Vitamins-Minerals (CENTRUM SILVER ADULT 50+ PO) Take 1 tablet by mouth daily.    [name] omeprazole (PRILOSEC) 20 MG capsule Take 1 capsule (20 mg total) by mouth 2 (two) times daily. 120 capsule 5  . traMADol (ULTRAM) 50 MG tablet Take 2 tablets (100 mg total) by mouth every 12 (twelve)  hours as needed for severe pain. 100 tablet 0  . Trolamine Salicylate (ASPERCREME EX) Apply 1 application topically daily as needed (hand pain).    . [DISCONTINUED] FLUoxetine (PROZAC) 10 MG tablet Take 1 tablet (10 mg total) by mouth daily. 30 tablet 2    Allergies: Allergies as of 02/09/2014 - Review Complete 02/09/2014  Allergen Reaction Noted  . Ibuprofen Other (See Comments) 01/17/2014   Past Medical History  Diagnosis Date  . Depression   . Hypertension   . Renal failure     Formerly on hemodialysis; managed by Dr. Corliss Parish  . Gastritis, Helicobacter pylori     Noted on EGD 01/18/2009 by [name];  biopsy showed chronic gastritis with Helicobacter pylori, treated by [name].  . Pericardial effusion 11/2004    S/P subxiphoid pericardial window 11/01/2004 by [name].  A question of possible collagen vascular disease had been raised in 2006 due to arthralgias, a history of idiopathic pericarditis, and increased pigmentation of her palms, and she was referred to [name] but he was unable to confirm a rheumatologic diagnosis.  [name] Hemorrhoids, internal 05/09/2005     Found on colonoscopy 05/09/2005 by [name]  . Cataracts, bilateral   . Pinguecula   . Vitreous detachment   . Thigh pain   . Epicondylitis   . Anemia, iron deficiency   . Angiodysplasia of colon 05/09/2005    Single small angiodysplastic lesion in the descending colon found on colonoscopy 05/09/2005 by [name]  . Anemia due to blood loss, chronic   . Stiffness of joint, not elsewhere classified, hand   . S/P hysterectomy   . Osteoporosis, unspecified 03/06/2012    A DXA bone density scan done 02/20/2012 showed osteoporosis with a lumbar spine T-score of -4.4 and a right femur T-score of -2.8.  Patient has chronic kidney disease with estimated GFR less than 30, and it is not clear how much of her osteoporosis may be due to renal osteodystrophy.     Past Surgical  History  Procedure Laterality Date  . Subxyphoid pericardial window  11/01/2004  . Arteriovenous graft placement  11/28/2005     Left forearm arteriovenous graft.  . Thrombectomy / arteriovenous graft revision  01/21/2006    Thrombectomy and revision of left forearm loop AV graft.  . Thrombectomy   03/06/2006    Simple thrombectomy arteriovenous Gore-Tex graft, left arm, with exploration of venous end and intraoperative shuntogram.         .  Hallux valgus correction  01/2012    Left foot   Family History  Problem Relation Age of Onset  . Hypertension Brother   . Heart disease Father   . Diabetes Brother   . Breast cancer Neg Hx   . Colon cancer Neg Hx   . Lung cancer Neg Hx   . Cervical cancer Neg Hx   . Sickle cell anemia Neg Hx    History   Social History  . Marital Status: Legally Separated    Spouse Name: N/A    Number of Children: N/A  . Years of Education: N/A   Occupational History  . Not on file.   Social History Main Topics  . Smoking status: Former Smoker -- 0.10 packs/day for 1 years    Quit date: 01/02/1960  . Smokeless tobacco: Never Used  . Alcohol Use: No  . Drug Use: No  . Sexual Activity: Not on file   Other Topics Concern  . Not on file   Social History Narrative    Review of Systems: A comprehensive review of systems was negative except for: as noted above per HPI  Physical Exam: Blood pressure 138/74, pulse 62, temperature 97.9 F (36.6 C), temperature source Oral, resp. rate 14, SpO2 97 %.  Gen: A&O x 4, no acute distress, well developed, well nourished HEENT: Atraumatic, PERRL, EOMI, sclerae anicteric, moist mucous membranes Heart: Regular rate and rhythm, normal S1 S2, no murmurs, rubs, or gallops, no tenderness on chest wall, well healed midline scar below sternum from pericardial window Lungs: Clear to auscultation bilaterally, respirations unlabored Abd: Soft, non-tender, non-distended, + bowel sounds, no hepatosplenomegaly Ext:  B/l 1+ pitting edema through ankles, L arm fistula without thrill  Lab results: Basic Metabolic Panel:  Recent Labs  02/09/14 1117  NA 139  K 4.7  CL 101  CO2 27  GLUCOSE 99  BUN 23  CREATININE 2.35*  CALCIUM 9.3   CBC:  Recent Labs  02/09/14 1117  WBC 5.1  HGB 12.0  HCT 37.5  MCV 94.7  PLT 263   Cardiac Enzymes:  Recent Labs  02/09/14 1430  TROPONINI 0.16*  i-stat trop 0.02, 0.01  Imaging results:  Dg Chest 2 View  02/09/2014   CLINICAL DATA:  Three-day history of short sternal chest pain and pressure  EXAM: CHEST  2 VIEW  COMPARISON:  March 12, 2012  FINDINGS: There is evidence of a degree of underlying emphysematous change. There is no edema or consolidation. Heart size and pulmonary vascularity are within normal limits. No adenopathy. There is degenerative change in the thoracic spine.  IMPRESSION: Underlying emphysematous change.  No edema or consolidation.   Electronically Signed   By: Lowella Grip III M.D.   On: 02/09/2014 12:49    Other results: EKG: normal EKG, normal sinus rhythm, no t-wave or ST changes from previous tracing 06/02/13.  Assessment & Plan by Problem: Active Problems:   Chest pain  #Chest pain: The cause of [name]'s chest pain is unclear. It is a substernal pressure with associated nausea, diaphoresis, lightheadedness which is concerning for ACS but it is non-exertional. Her EKG is unchanged and trops were negative x 2 in ED but most recent is 0.16. TIMI score is 4 which is 20 percent risk at 14 days of all cause mortality, new or recurrent MI, or severe recurrent ischemia requiring urgent revascularization. This may also be in the setting of her CKD but earlier trops were negative. She had a stress test in  2001 which was negative.. Last echo 03/2012 with EF [id number], no diastolic dysfunction. Other considerations include musculoskeletal but is not reproducible, GI. Infection unlikely as no evidence on CXR and afebrile with no leukocytosis.  Cardiology has been consulted -f/u cardiology consult -consider echo -troponin q6h x 3 -repeat EKG in am -hemoglobin A1c, lipid profile -protonix 40 mg po daily -tylenol 650 mg po or pr q6hprn -cardiac monitor -daily weight, I&O -O2 sat >92% -PT/OT  #HTN: At home she is on amlodipine 10 mg qhs, atenolol 25 mg qhs, lasix 40 mg bid. She was supposed to be on lasix 40 mg daily but says the past two weeks she is giving herself bid dosing. -cont amlodipine 10 mg qhs -hold atenolol as HR in the 60s -hold lasix for now to clarify dosage and not very edematous on exam  #CKD stage 4: Presenting creatinine 2.34 with GFR 22. Baseline creatinine appears to be around 2.25. She was previously on HD which she says was started about 8 years ago but says she stopped about 4 years ago. She is a patient of Dr Moshe Cipro. -cont to monitor  #GERD: See above re chest pain. At home she is on omeprazole 20 mg bid -protonix 40 mg daily  #Diet: NPO  #DVT PPx: heparin 5000 u [place] tid  #Code: full   Dispo: Disposition is deferred at this time, awaiting improvement of current medical problems. Anticipated discharge in approximately 1-2 day(s).   The patient does have a current PCP Axel Filler, MD) and does need an [facility] hospital follow-up appointment after discharge.  The patient does not know have transportation limitations that hinder transportation to clinic appointments.  Signed: Kelby Aline, MD 02/09/2014, 6:33 PM    ADDENDUM to change co-signing attending

## 2014-02-09 NOTE — ED Notes (Signed)
Patient transported to X-ray 

## 2014-02-09 NOTE — Progress Notes (Signed)
Pt admitted to room. Vitals stable. Will continue to monitor.

## 2014-02-09 NOTE — ED Provider Notes (Signed)
CSN: [id number]     Arrival date & time 02/09/14  1053 History   First MD Initiated Contact with Patient 02/09/14 1109     Chief Complaint  Patient presents with  . Chest Pain     (Consider location/radiation/quality/duration/timing/severity/associated sxs/prior Treatment) HPI  [name] is a 80 y.o. female with PMH of renal disease formally on hemodialysis for last 4 years or so, hypertension, anemia presenting with intermittent chest pain center chest that does not radiate. Patient states pain lasts minutes and associated with nausea, diaphoresis and at times lightheadedness. Patient was concerned with feeling flushed during episodes. Patient states they happen at rest and with activity. She does not note any alleviating or aggravating factors. Patient hasn't had chest pain before. A shunt states she has had some swelling of her lower extremity this is resolved. Patient does not have cardiac history. No history of dyslipidemia, diabetes. Patient with family history of heart disease. Pt denies history of DVT, PE, hemoptysis, estrogen use, recent surgery or trauma, malignancy.   Past Medical History  Diagnosis Date  . Depression   . Hypertension   . Renal failure     Formerly on hemodialysis; managed by Dr. Corliss Parish  . Gastritis, Helicobacter pylori     Noted on EGD 01/18/2009 by [name];  biopsy showed chronic gastritis with Helicobacter pylori, treated by [name].  . Pericardial effusion 11/2004    S/P subxiphoid pericardial window 11/01/2004 by [name].  A question of possible collagen vascular disease had been raised in 2006 due to arthralgias, a history of idiopathic pericarditis, and increased pigmentation of her palms, and she was referred to [name] but he was unable to confirm a rheumatologic diagnosis.  [name] Hemorrhoids, internal 05/09/2005     Found on colonoscopy 05/09/2005 by [name]  . Cataracts, bilateral   . Pinguecula    . Vitreous detachment   . Thigh pain   . Epicondylitis   . Anemia, iron deficiency   . Angiodysplasia of colon 05/09/2005    Single small angiodysplastic lesion in the descending colon found on colonoscopy 05/09/2005 by [name]  . Anemia due to blood loss, chronic   . Stiffness of joint, not elsewhere classified, hand   . S/P hysterectomy   . Osteoporosis, unspecified 03/06/2012    A DXA bone density scan done 02/20/2012 showed osteoporosis with a lumbar spine T-score of -4.4 and a right femur T-score of -2.8.  Patient has chronic kidney disease with estimated GFR less than 30, and it is not clear how much of her osteoporosis may be due to renal osteodystrophy.     Past Surgical History  Procedure Laterality Date  . Subxyphoid pericardial window  11/01/2004  . Arteriovenous graft placement  11/28/2005     Left forearm arteriovenous graft.  . Thrombectomy / arteriovenous graft revision  01/21/2006    Thrombectomy and revision of left forearm loop AV graft.  . Thrombectomy   03/06/2006    Simple thrombectomy arteriovenous Gore-Tex graft, left arm, with exploration of venous end and intraoperative shuntogram.         . Hallux valgus correction  01/2012    Left foot   Family History  Problem Relation Age of Onset  . Hypertension Brother   . Heart disease Father   . Diabetes Brother   . Breast cancer Neg Hx   . Colon cancer Neg Hx   . Lung cancer Neg Hx   . Cervical cancer  Neg Hx   . Sickle cell anemia Neg Hx    History  Substance Use Topics  . Smoking status: Former Smoker -- 0.10 packs/day for 1 years    Quit date: 01/02/1960  . Smokeless tobacco: Never Used  . Alcohol Use: No   OB History    No data available     Review of Systems 10 Systems reviewed and are negative for acute change except as noted in the HPI.    Allergies  Ibuprofen  Home Medications   Prior to Admission medications   Medication Sig Start Date End Date Taking? Authorizing Provider   amLODipine (NORVASC) 10 MG tablet Take 1 tablet (10 mg total) by mouth at bedtime. 01/07/13  Yes Axel Filler, MD  atenolol (TENORMIN) 25 MG tablet Take 1 tablet (25 mg total) by mouth at bedtime. 01/07/13  Yes Axel Filler, MD  buPROPion (WELLBUTRIN XL) 150 MG 24 hr tablet Take 1 tablet (150 mg total) by mouth daily. 12/21/13  Yes Axel Filler, MD  cinacalcet (SENSIPAR) 30 MG tablet Take 1 tablet (30 mg total) by mouth daily. With meals. 01/07/13  Yes Axel Filler, MD  diphenhydramine-acetaminophen (TYLENOL PM) 25-500 MG TABS Take 1 tablet by mouth at bedtime as needed (sleep).   Yes Historical Provider, MD  furosemide (LASIX) 80 MG tablet Take 0.5 tablets (40 mg total) by mouth daily. 01/07/13  Yes Axel Filler, MD  HYDROcodone-acetaminophen (NORCO/VICODIN) 5-325 MG per tablet Take 1 tablet by mouth every 12 (twelve) hours as needed for moderate pain. 01/20/14  Yes Arman Filter, MD  meclizine (ANTIVERT) 12.5 MG tablet Take 12.5 mg by mouth 3 (three) times daily as needed for dizziness or nausea.   Yes Historical Provider, MD  Multiple Vitamins-Minerals (CENTRUM SILVER ADULT 50+ PO) Take 1 tablet by mouth daily.   Yes Historical Provider, MD  omeprazole (PRILOSEC) 20 MG capsule Take 1 capsule (20 mg total) by mouth 2 (two) times daily. 01/07/13  Yes Axel Filler, MD  traMADol (ULTRAM) 50 MG tablet Take 2 tablets (100 mg total) by mouth every 12 (twelve) hours as needed for severe pain. 01/29/14  Yes Arman Filter, MD  Trolamine Salicylate (ASPERCREME EX) Apply 1 application topically daily as needed (hand pain).   Yes Historical Provider, MD   BP 130/83 mmHg  Pulse 58  Temp(Src) 97.9 F (36.6 C) (Oral)  Resp 21  SpO2 97% Physical Exam  Constitutional: She appears well-developed and well-nourished. No distress.  HENT:  Head: Normocephalic and atraumatic.  Eyes: Conjunctivae and EOM are normal. Right eye exhibits no discharge. Left eye exhibits no discharge.  Neck: No  JVD present.  Cardiovascular: Normal rate and regular rhythm.   No peripheral edema or tenderness tenderness.  Pulmonary/Chest: Effort normal and breath sounds normal. No respiratory distress. She has no wheezes.  Abdominal: Soft. Bowel sounds are normal. She exhibits no distension. There is no tenderness.  Neurological: She is alert. She exhibits normal muscle tone. Coordination normal.  Skin: Skin is warm and dry. She is not diaphoretic.  Nursing note and vitals reviewed.   ED Course  Procedures (including critical care time) Labs Review Labs Reviewed  BASIC METABOLIC PANEL - Abnormal; Notable for the following:    Creatinine, Ser 2.35 (*)    GFR calc non Af Amer 19 (*)    GFR calc Af Amer 22 (*)    All other components within normal limits  TROPONIN I - Abnormal; Notable for the following:  Troponin I 0.16 (*)    All other components within normal limits  CBC  TROPONIN I  TROPONIN I  I-STAT TROPOININ, ED  I-STAT TROPOININ, ED    Imaging Review Dg Chest 2 View  02/09/2014   CLINICAL DATA:  Three-day history of short sternal chest pain and pressure  EXAM: CHEST  2 VIEW  COMPARISON:  March 12, 2012  FINDINGS: There is evidence of a degree of underlying emphysematous change. There is no edema or consolidation. Heart size and pulmonary vascularity are within normal limits. No adenopathy. There is degenerative change in the thoracic spine.  IMPRESSION: Underlying emphysematous change.  No edema or consolidation.   Electronically Signed   By: Lowella Grip III M.D.   On: 02/09/2014 12:49     EKG Interpretation   Date/Time:  Tuesday February 09 2014 11:10:52 EST Ventricular Rate:  72 PR Interval:  150 QRS Duration: 88 QT Interval:  404 QTC Calculation: 442 R Axis:   48 Text Interpretation:  Normal sinus rhythm Normal ECG since last tracing no  significant change Confirmed by MILLER  MD, BRIAN ([id number]) on 02/09/2014  11:31:05 AM      MDM   Final diagnoses:  Chest  pain, unspecified chest pain type   Patient with history of hypertension, smoking formerly on hemodialysis for chronic kidney disease presenting with transient chest pain that she's had for the past 2-3 days. She does not have a cardiologist. VSS. Patient without active chest pain. Is given an aspirin in ED with improvement of her symptoms. Initial troponin negative patient has no electrolyte abnormalities negative chest x-ray and EKG without significant change from prior. Creatine is baseline. Concern with her Cardiac risk factors as well as 2 episodes of chest pain today. Consult to internal medicine for admission spoke with Dr. Redmond Pulling agrees to admit patient to Dr. Zenovia Jarred service for cardiac rule out.  Discussed all results and patient verbalizes understanding and agrees with plan.  This is a shared patient. This patient was discussed with the physician who saw and evaluated the patient and agrees with the plan.    Pura Spice, PA-C 02/09/14 [place], PA-C 02/09/14 1550  Johnna Acosta, MD 02/10/14 2126

## 2014-02-09 NOTE — ED Notes (Signed)
Pt reports intermittent upper central abdominal pain with nausea, diaphoresis and lightheadedness. Reports episodes "come and go. I get so hot when it happens". Pt reports pain 8/10 currently. NAD.

## 2014-02-09 NOTE — Consult Note (Signed)
CONSULTATION NOTE  Reason for Consult: Chest pain  Requesting Physician: Dr. Lynnae January  Cardiologist: None (new)  HPI: This is a 80 y.o. female with a past medical history significant for hypertension, renal failure with unknown etiology, formally on hemodialysis, history of gastritis with Helicobacter pylori, pericardial effusion with a question of possible collagen vascular disease, however rheumatologic diagnosis cannot be confirmed. She has no history of coronary disease. She had an echocardiogram in 2014 which interestingly showed mild LVH, EF of 60%, and upper normal left atrial size.  She presents today with a couple episodes of chest discomfort. The first happened a few days ago and was short lasting. She described it as a squeezing in the center of the chest, there was some nausea and associated diaphoresis. This episode recurred today and again felt like a pressure in the chest that lasted for 15-20 minutes. There is associated with a hot and flushed feeling across the chest and radiated up to her neck. It was not at exertion or relieved by rest. She had not eaten and only drank a half a cup of coffee in the morning. She reports that she has not missed any of her medications for reflux. She thinks the symptoms are different than her prior reflux symptoms.  PMHx:  Past Medical History  Diagnosis Date  . Depression   . Hypertension   . Renal failure     Formerly on hemodialysis; managed by Dr. Corliss Parish  . Gastritis, Helicobacter pylori     Noted on EGD 01/18/2009 by [name];  biopsy showed chronic gastritis with Helicobacter pylori, treated by [name].  . Pericardial effusion 11/2004    S/P subxiphoid pericardial window 11/01/2004 by [name].  A question of possible collagen vascular disease had been raised in 2006 due to arthralgias, a history of idiopathic pericarditis, and increased pigmentation of her palms, and she was referred to Dr.  Rockwell [place] but he was unable to confirm a rheumatologic diagnosis.  [name] Hemorrhoids, internal 05/09/2005     Found on colonoscopy 05/09/2005 by [name]  . Cataracts, bilateral   . Pinguecula   . Vitreous detachment   . Thigh pain   . Epicondylitis   . Anemia, iron deficiency   . Angiodysplasia of colon 05/09/2005    Single small angiodysplastic lesion in the descending colon found on colonoscopy 05/09/2005 by [name]  . Anemia due to blood loss, chronic   . Stiffness of joint, not elsewhere classified, hand   . S/P hysterectomy   . Osteoporosis, unspecified 03/06/2012    A DXA bone density scan done 02/20/2012 showed osteoporosis with a lumbar spine T-score of -4.4 and a right femur T-score of -2.8.  Patient has chronic kidney disease with estimated GFR less than 30, and it is not clear how much of her osteoporosis may be due to renal osteodystrophy.     Past Surgical History  Procedure Laterality Date  . Subxyphoid pericardial window  11/01/2004  . Arteriovenous graft placement  11/28/2005     Left forearm arteriovenous graft.  . Thrombectomy / arteriovenous graft revision  01/21/2006    Thrombectomy and revision of left forearm loop AV graft.  . Thrombectomy   03/06/2006    Simple thrombectomy arteriovenous Gore-Tex graft, left arm, with exploration of venous end and intraoperative shuntogram.         . Hallux valgus correction  01/2012    Left foot    FAMHx: Family History  Problem  Relation Age of Onset  . Hypertension Brother   . Heart disease Father   . Diabetes Brother   . Breast cancer Neg Hx   . Colon cancer Neg Hx   . Lung cancer Neg Hx   . Cervical cancer Neg Hx   . Sickle cell anemia Neg Hx     SOCHx:  reports that she quit smoking about 54 years ago. She has never used smokeless tobacco. She reports that she does not drink alcohol or use illicit drugs.  ALLERGIES: Allergies  Allergen Reactions  . Ibuprofen Other (See Comments)    PT limited intake  because of chronic kidney DX    ROS: A comprehensive review of systems was negative except for: Constitutional: positive for fatigue and sweats Cardiovascular: positive for chest pressure/discomfort and lower extremity edema Gastrointestinal: positive for nausea and reflux symptoms Genitourinary: positive for hot flashes  HOME MEDICATIONS:   Medication List    ASK your doctor about these medications        amLODipine 10 MG tablet  Commonly known as:  NORVASC  Take 1 tablet (10 mg total) by mouth at bedtime.     ASPERCREME EX  Apply 1 application topically daily as needed (hand pain).     atenolol 25 MG tablet  Commonly known as:  TENORMIN  Take 1 tablet (25 mg total) by mouth at bedtime.     buPROPion 150 MG 24 hr tablet  Commonly known as:  WELLBUTRIN XL  Take 1 tablet (150 mg total) by mouth daily.     CENTRUM SILVER ADULT 50+ PO  Take 1 tablet by mouth daily.     cinacalcet 30 MG tablet  Commonly known as:  SENSIPAR  Take 1 tablet (30 mg total) by mouth daily. With meals.     diphenhydramine-acetaminophen 25-500 MG Tabs  Commonly known as:  TYLENOL PM  Take 1 tablet by mouth at bedtime as needed (sleep).     furosemide 80 MG tablet  Commonly known as:  LASIX  Take 0.5 tablets (40 mg total) by mouth daily.     HYDROcodone-acetaminophen 5-325 MG per tablet  Commonly known as:  NORCO/VICODIN  Take 1 tablet by mouth every 12 (twelve) hours as needed for moderate pain.     meclizine 12.5 MG tablet  Commonly known as:  ANTIVERT  Take 12.5 mg by mouth 3 (three) times daily as needed for dizziness or nausea.     omeprazole 20 MG capsule  Commonly known as:  PRILOSEC  Take 1 capsule (20 mg total) by mouth 2 (two) times daily.     traMADol 50 MG tablet  Commonly known as:  ULTRAM  Take 2 tablets (100 mg total) by mouth every 12 (twelve) hours as needed for severe pain.        HOSPITAL MEDICATIONS: I have reviewed the patient's current  medications.  VITALS: Blood pressure 138/74, pulse 62, temperature 97.9 F (36.6 C), temperature source Oral, resp. rate 14, SpO2 97 %.  PHYSICAL EXAM: General appearance: alert and no distress Neck: no carotid bruit and no JVD Lungs: clear to auscultation bilaterally Heart: regular rate and rhythm, S1, S2 normal, no murmur, click, rub or gallop Abdomen: soft, non-tender; bowel sounds normal; no masses,  no organomegaly Extremities: extremities normal, atraumatic, no cyanosis or edema Pulses: 2+ and symmetric Skin: Skin color, texture, turgor normal. No rashes or lesions or mild sclerodactylyl Neurologic: Grossly normal Psych: Pleasant  LABS: Results for orders placed or performed during the hospital  encounter of 02/09/14 (from the past 48 hour(s))  CBC     Status: None   Collection Time: 02/09/14 11:17 AM  Result Value Ref Range   WBC 5.1 4.0 - 10.5 K/uL   RBC 3.96 3.87 - 5.11 MIL/uL   Hemoglobin 12.0 12.0 - 15.0 g/dL   HCT 37.5 36.0 - 46.0 %   MCV 94.7 78.0 - 100.0 fL   MCH 30.3 26.0 - 34.0 pg   MCHC 32.0 30.0 - 36.0 g/dL   RDW 13.1 11.5 - 15.5 %   Platelets 263 150 - 400 K/uL  Basic metabolic panel     Status: Abnormal   Collection Time: 02/09/14 11:17 AM  Result Value Ref Range   Sodium 139 135 - 145 mmol/L   Potassium 4.7 3.5 - 5.1 mmol/L   Chloride 101 96 - 112 mmol/L   CO2 27 19 - 32 mmol/L   Glucose, Bld 99 70 - 99 mg/dL   BUN 23 6 - 23 mg/dL   Creatinine, Ser 2.35 (H) 0.50 - 1.10 mg/dL   Calcium 9.3 8.4 - 10.5 mg/dL   GFR calc non Af Amer 19 (L) >90 mL/min   GFR calc Af Amer 22 (L) >90 mL/min    Comment: (NOTE) The eGFR has been calculated using the CKD EPI equation. This calculation has not been validated in all clinical situations. eGFR's persistently <90 mL/min signify possible Chronic Kidney Disease.    Anion gap 11 5 - 15  I-stat troponin, ED (not at [facility])     Status: None   Collection Time: 02/09/14 11:37 AM  Result Value Ref Range   Troponin i,  poc 0.02 0.00 - 0.08 ng/mL   Comment 3            Comment: Due to the release kinetics of cTnI, a negative result within the first hours of the onset of symptoms does not rule out myocardial infarction with certainty. If myocardial infarction is still suspected, repeat the test at appropriate intervals.   Troponin I (q 6hr x 3)     Status: Abnormal   Collection Time: 02/09/14  2:30 PM  Result Value Ref Range   Troponin I 0.16 (H) <0.031 ng/mL    Comment:        PERSISTENTLY INCREASED TROPONIN VALUES IN THE RANGE OF 0.04-0.49 ng/mL CAN BE SEEN IN:       -UNSTABLE ANGINA       -CONGESTIVE HEART FAILURE       -MYOCARDITIS       -CHEST TRAUMA       -ARRYHTHMIAS       -LATE PRESENTING MYOCARDIAL INFARCTION       -COPD   CLINICAL FOLLOW-UP RECOMMENDED.   I-Stat Troponin, ED (not at [facility])     Status: None   Collection Time: 02/09/14  2:45 PM  Result Value Ref Range   Troponin i, poc 0.01 0.00 - 0.08 ng/mL   Comment 3            Comment: Due to the release kinetics of cTnI, a negative result within the first hours of the onset of symptoms does not rule out myocardial infarction with certainty. If myocardial infarction is still suspected, repeat the test at appropriate intervals.     IMAGING: Dg Chest 2 View  02/09/2014   CLINICAL DATA:  Three-day history of short sternal chest pain and pressure  EXAM: CHEST  2 VIEW  COMPARISON:  March 12, 2012  FINDINGS: There is evidence of  a degree of underlying emphysematous change. There is no edema or consolidation. Heart size and pulmonary vascularity are within normal limits. No adenopathy. There is degenerative change in the thoracic spine.  IMPRESSION: Underlying emphysematous change.  No edema or consolidation.   Electronically Signed   By: Lowella Grip III M.D.   On: 02/09/2014 12:49    HOSPITAL DIAGNOSES: Active Problems:   Chest pain   IMPRESSION: 1. Chest pain  RECOMMENDATION: Mrs. Wiswell is had 2 episodes of chest  discomfort which is central substernal and radiates somewhat up to the neck associated with hot, flushed feeling, nausea but no vomiting. She denied any belching or improvement with medication her symptoms. The pain resolved on its own when she arrived in the ER. She does have a history of pericardial effusion status post window and it is unlikely but conceivable she could've had some recurrence of this. Also she has a history of reflux with H. pylori. The symptoms do sound to be more gastrointestinal in nature. I recommend a rule out overnight and stress testing in the morning. I discussed the plan with her and she is in agreement.  Thanks for consulting [place]. Cardiology will follow along with you.  Time Spent Directly with Patient: 15 minutes  Pixie Casino, MD, [facility] Attending Cardiologist [facility]  Trinetta Alemu C 02/09/2014, 6:28 PM

## 2014-02-10 ENCOUNTER — Observation Stay (HOSPITAL_COMMUNITY): Payer: Medicare Other

## 2014-02-10 DIAGNOSIS — N184 Chronic kidney disease, stage 4 (severe): Secondary | ICD-10-CM

## 2014-02-10 DIAGNOSIS — I129 Hypertensive chronic kidney disease with stage 1 through stage 4 chronic kidney disease, or unspecified chronic kidney disease: Secondary | ICD-10-CM

## 2014-02-10 DIAGNOSIS — R079 Chest pain, unspecified: Secondary | ICD-10-CM

## 2014-02-10 DIAGNOSIS — K219 Gastro-esophageal reflux disease without esophagitis: Principal | ICD-10-CM

## 2014-02-10 LAB — LIPID PANEL
Cholesterol: 173 mg/dL (ref 0–200)
HDL: 54 mg/dL (ref >39)
LDL Cholesterol: 92 mg/dL (ref 0–99)
Total CHOL/HDL Ratio: 3.2 RATIO
Triglycerides: 136 mg/dL (ref <150)
VLDL: 27 mg/dL (ref 0–40)

## 2014-02-10 LAB — BASIC METABOLIC PANEL
Anion gap: 10 (ref 5–15)
BUN: 24 mg/dL — ABNORMAL HIGH (ref 6–23)
CO2: 25 mmol/L (ref 19–32)
Calcium: 9.1 mg/dL (ref 8.4–10.5)
Chloride: 104 mmol/L (ref 96–112)
Creatinine, Ser: 2.06 mg/dL — ABNORMAL HIGH (ref 0.50–1.10)
GFR calc Af Amer: 26 mL/min — ABNORMAL LOW (ref >90)
GFR calc non Af Amer: 22 mL/min — ABNORMAL LOW (ref >90)
Glucose, Bld: 99 mg/dL (ref 70–99)
Potassium: 4.2 mmol/L (ref 3.5–5.1)
Sodium: 139 mmol/L (ref 135–145)

## 2014-02-10 LAB — TROPONIN I: Troponin I: <0.03 ng/mL (ref <0.031)

## 2014-02-10 MED ORDER — REGADENOSON 0.4 MG/5ML IV SOLN
0.4000 mg | Freq: Once | INTRAVENOUS | Status: AC
  Administered 2014-02-10: 0.4 mg via INTRAVENOUS
  Filled 2014-02-10: qty 5

## 2014-02-10 MED ORDER — FUROSEMIDE 40 MG PO TABS
40.0000 mg | ORAL_TABLET | Freq: Every day | ORAL | Status: DC
  Filled 2014-02-10: qty 1

## 2014-02-10 MED ORDER — REGADENOSON 0.4 MG/5ML IV SOLN
INTRAVENOUS | Status: AC
  Administered 2014-02-10: 0.4 mg via INTRAVENOUS
  Filled 2014-02-10: qty 5

## 2014-02-10 MED ORDER — TECHNETIUM TC 99M SESTAMIBI GENERIC - CARDIOLITE
30.0000 | Freq: Once | INTRAVENOUS | Status: AC | PRN
  Administered 2014-02-10: 30 via INTRAVENOUS

## 2014-02-10 MED ORDER — TECHNETIUM TC 99M SESTAMIBI GENERIC - CARDIOLITE
10.0000 | Freq: Once | INTRAVENOUS | Status: AC | PRN
  Administered 2014-02-10: 10 via INTRAVENOUS

## 2014-02-10 MED ORDER — ATENOLOL 25 MG PO TABS
25.0000 mg | ORAL_TABLET | Freq: Every day | ORAL | Status: DC
  Filled 2014-02-10: qty 1

## 2014-02-10 NOTE — Progress Notes (Signed)
PT Cancellation Note  Patient Details Name: [name] MRN: [id number] DOB: April 09, 1934   Cancelled Treatment:    Reason Eval/Treat Not Completed: PT screened, no needs identified, will sign off. Pt independent with mobility.   [place], [place] 02/10/2014, 11:54 AM

## 2014-02-10 NOTE — Discharge Instructions (Addendum)
1. You have hospital follow up appointments as follows: 02/17/2014 with Dr. Clinton Gallant in the [facility]   2. Please take all medications as prescribed.    3. If you have worsening of your symptoms or new symptoms arise, please call the clinic [phone]), or go to the ER immediately if symptoms are severe.

## 2014-02-10 NOTE — Progress Notes (Signed)
OT Cancellation Note  Patient Details Name: [name] MRN: [id number] DOB: [birth year]   Cancelled Treatment:    Reason Eval/Treat Not Completed: Patient at procedure or test/ unavailable (Will continue to follow.)  Malka So 02/10/2014, 10:26 AM

## 2014-02-10 NOTE — Progress Notes (Signed)
Subjective: Currently CP free w/o dyspnea. Pt complained of nausea after administration of Lexiscan. Symptoms improved during recovery phase of stress test.   Objective: Vital signs in last 24 hours: Temp:  [97.9 F (36.6 C)-98.7 F (37.1 C)] 98.1 F (36.7 C) (02/10 0443) Pulse Rate:  [58-75] 69 (02/10 0443) Resp:  [11-21] 16 (02/10 0443) BP: (121-162)/(61-87) 137/72 mmHg (02/10 0443) SpO2:  [92 %-100 %] 99 % (02/10 0443) Weight:  [161 lb 2.5 oz (73.1 kg)] 161 lb 2.5 oz (73.1 kg) (02/10 0443) Weight change:  Last BM Date: 02/09/14 Intake/Output from previous day: 02/09 0701 - 02/10 0700 In: -  Out: 300 [Urine:300] Intake/Output this shift:    PE: General:Pleasant affect, NAD Skin:Warm and dry, brisk capillary refill HEENT:normocephalic, sclera clear, mucus membranes moist Neck:supple, no JVD, no bruits  Heart:S1S2 RRR without murmur, gallup, rub or click Lungs:clear without rales, rhonchi, or wheezes [id number], non tender, + BS, do not palpate liver spleen or masses Ext:no lower ext edema, 2+ pedal pulses, 2+ radial pulses Neuro:alert and oriented, MAE, follows commands, + facial symmetry EKG SR to S arrthymia    Lab Results:  Recent Labs  02/09/14 1117  WBC 5.1  HGB 12.0  HCT 37.5  PLT 263   BMET  Recent Labs  02/09/14 1117 02/10/14 0219  NA 139 139  K 4.7 4.2  CL 101 104  CO2 27 25  GLUCOSE 99 99  BUN 23 24*  CREATININE 2.35* 2.06*  CALCIUM 9.3 9.1    Recent Labs  02/09/14 2124 02/10/14 0219  TROPONINI <0.03 <0.03    Lab Results  Component Value Date   CHOL 173 02/10/2014   HDL 54 02/10/2014   LDLCALC 92 02/10/2014   TRIG 136 02/10/2014   CHOLHDL 3.2 02/10/2014   No results found for: HGBA1C   Lab Results  Component Value Date   TSH 4.091 02/13/2012    Hepatic Function Panel No results for input(s): PROT, ALBUMIN, AST, ALT, ALKPHOS, BILITOT, BILIDIR, IBILI in the last 72 hours.  Recent Labs  02/10/14 0219  CHOL 173    No results for input(s): PROTIME in the last 72 hours.     Studies/Results: Dg Chest 2 View  02/09/2014   CLINICAL DATA:  Three-day history of short sternal chest pain and pressure  EXAM: CHEST  2 VIEW  COMPARISON:  March 12, 2012  FINDINGS: There is evidence of a degree of underlying emphysematous change. There is no edema or consolidation. Heart size and pulmonary vascularity are within normal limits. No adenopathy. There is degenerative change in the thoracic spine.  IMPRESSION: Underlying emphysematous change.  No edema or consolidation.   Electronically Signed   By: Lowella Grip III M.D.   On: 02/09/2014 12:49    Medications: I have reviewed the patient's current medications. Scheduled Meds: . amLODipine  10 mg Oral QHS  . buPROPion  150 mg Oral Daily  . cinacalcet  30 mg Oral Q breakfast  . heparin  5,000 Units Subcutaneous 3 times per day  . pantoprazole  40 mg Oral Daily  . regadenoson      . regadenoson  0.4 mg Intravenous Once  . sodium chloride  3 mL Intravenous Q12H   Continuous Infusions:  PRN Meds:.acetaminophen **OR** acetaminophen  Assessment/Plan:  80 y.o. female with a past medical history significant for hypertension, renal failure with unknown etiology, formally on hemodialysis, history of gastritis with Helicobacter pylori, pericardial effusion with a question of  possible collagen vascular disease, however rheumatologic diagnosis cannot be confirmed. She has no history of coronary disease. She had an echocardiogram in 2014 which interestingly showed mild LVH, EF of 60%, and upper normal left atrial size.  Admitted for chest pain- central substernal and radiates somewhat up to the neck associated with hot, flushed feeling, nausea but no vomiting. She denied any belching or improvement with medication her symptoms. The pain resolved on its own when she arrived in the ER.   Active Problems:   1. Chest pain-  Initial troponin 0.16 all others <0.03. Patient  tolerated NST fairly well. No EKG changes noted. Radiologist interpretation to follow. Discharge home later today if stress test is negative. If abnormal NST, will keep overnight and plan for LHC in am.   MD to follow with further recommendations.     LOS: 1 day   Naveh Rickles 02/10/2014

## 2014-02-10 NOTE — Progress Notes (Addendum)
Subjective: No overnight events. Pt denies any further chest pain, no SOB or abdominal pain. Pt states that she vomited this morning after getting ginger ale after stress test. She denies any nausea or further episodes of emesis. Myoview today w/o EKG abnormality today. Radiology review pending.   Objective: Vital signs in last 24 hours: Filed Vitals:   02/09/14 1700 02/09/14 1800 02/09/14 1920 02/10/14 0443  BP: 121/71 138/74 158/87 137/72  Pulse: 60 62 65 69  Temp:   98.7 F (37.1 C) 98.1 F (36.7 C)  TempSrc:   Oral Oral  Resp: 20 14 16 16   Height:   5\' 2"  (1.575 m)   Weight:   73.1 kg (161 lb 2.5 oz) 73.1 kg (161 lb 2.5 oz)  SpO2: 93% 97% 97% 99%   Weight change:  No intake or output data in the 24 hours ending 02/10/14 0735 Vitals reviewed. General: Sitting in chair, NAD HEENT: EOMI, no scleral icterus Cardiac: RRR, no rubs, murmurs or gallops Pulm: Clear to auscultation bilaterally, no wheezes, rales, or rhonchi Abd: Soft, nontender, nondistended, BS present Ext: Warm and well perfused, no edema Neuro: Alert and oriented X3, cranial nerves II-XII grossly intact, moves all extremities.  Lab Results: Basic Metabolic Panel:  Recent Labs Lab 02/09/14 1117 02/10/14 0219  NA 139 139  K 4.7 4.2  CL 101 104  CO2 27 25  GLUCOSE 99 99  BUN 23 24*  CREATININE 2.35* 2.06*  CALCIUM 9.3 9.1   CBC:  Recent Labs Lab 02/09/14 1117  WBC 5.1  HGB 12.0  HCT 37.5  MCV 94.7  PLT 263   Cardiac Enzymes:  Recent Labs Lab 02/09/14 1430 02/09/14 2124 02/10/14 0219  TROPONINI 0.16* <0.03 <0.03   Fasting Lipid Panel:  Recent Labs Lab 02/10/14 0219  CHOL 173  HDL 54  LDLCALC 92  TRIG 136  CHOLHDL 3.2    Micro Results: No results found for this or any previous visit (from the past 240 hour(s)).   Studies/Results: Dg Chest 2 View  02/09/2014   CLINICAL DATA:  Three-day history of short sternal chest pain and pressure  EXAM: CHEST  2 VIEW  COMPARISON:  March 12, 2012  FINDINGS: There is evidence of a degree of underlying emphysematous change. There is no edema or consolidation. Heart size and pulmonary vascularity are within normal limits. No adenopathy. There is degenerative change in the thoracic spine.  IMPRESSION: Underlying emphysematous change.  No edema or consolidation.   Electronically Signed   By: Lowella Grip III M.D.   On: 02/09/2014 12:49   Medications: I have reviewed the patient's current medications. Scheduled Meds: . amLODipine  10 mg Oral QHS  . buPROPion  150 mg Oral Daily  . cinacalcet  30 mg Oral Q breakfast  . heparin  5,000 Units Subcutaneous 3 times per day  . pantoprazole  40 mg Oral Daily  . sodium chloride  3 mL Intravenous Q12H   Continuous Infusions:  PRN Meds:.acetaminophen **OR** acetaminophen   Assessment/Plan: 80 year old woman with HTN, CKD stage 4, s/p pericardial effusion window 11/2004 admitted with chest pain.   #Chest pain: Pt with acute onset substernal, non-radiating chest pain on 2/6. The pain resolved after a few minutes but reoccurred 2/9 and she presented to the ED. IMTS was asked to admit for ACS r/o. EKG without changes, troponin x1 positive at 0.16, but subsequently improved to <0.03 x2. Cardiology was consulted and performed myoview today which showed no EKG changes.  Final impression: Normal stress nuclear perfusion study. LVEF 72%, read by Dr. Debara Pickett. Pt w/o further episodes of chest pain, denies SOB, abdominal pain, or nausea. 1 episode of emesis after myoview. Minimizing risk for MI, A1c pending, lipid panel normal, restaring home atenolol for better BP control. Pt likely able to go home today and will not require LHC, but will talk to Cardiology before discharging.  - [place] with Cardiology. - Will consider starting statin for anti-inflammatory properties - F/u A1c - Restart Atenolol  #HTN: Pt on CCB, BB, and diuretic at home. BP elevated this morning w/ SBP in 150s,  around the time of her myoview. Amlodipine continued on admission. Atenolol held in the setting of mild bradycardia with HR in 50s; HR now in 80-90s.  - Continue amlodipine 10mg  daily  - Restart Atenolol 25mg  qhs  #CKD 4: Cr 2.34/GFr 22 on admission. BL Cr ~ 2.25. Pt previously on HD, which was stopped 4 ys ago. She follows with Dr. Moshe Cipro with [facility]. Cr actually better on repeat today at 2.06.   #GERD: Pt on omeprazole at home. Protonix started on admission 2/2 hospital formulary. Chest pain thought to be GI related. - Continue PPI  #DVT PPx: [place] [facility]  Dispo: Disposition is deferred at this time, awaiting improvement of current medical problems.  Anticipated discharge in approximately 0-1 day(s).   The patient does have a current PCP Axel Filler, MD) and does need an [facility] hospital follow-up appointment after discharge.  The patient does not have transportation limitations that hinder transportation to clinic appointments.  .Services Needed at time of discharge: Y = Yes, Blank = No PT:   OT:   RN:   Equipment:   Other:     LOS: 1 day   Otho Bellows, MD Internal Medicine Resident, [facility] 02/10/2014 1:52 PM

## 2014-02-10 NOTE — Progress Notes (Signed)
Patient is getting sick to her stomach and hasn't eaten since yesterday at 3pm. Paged the IM doctor and they stated that they are waiting on cardiology to look at results of stress test. Paged Rosita Fire, PA and no response. Informed patient regarding what the attending told me and she still sent her daughter to get her something to eat.

## 2014-02-10 NOTE — Progress Notes (Signed)
OT Cancellation Note  Patient Details Name: [name] MRN: [id number] DOB: 07-28-1934   Cancelled Treatment:    Reason Eval/Treat Not Completed: OT screened, no needs identified, will sign off  [name] 02/10/2014, 2:01 PM

## 2014-02-10 NOTE — Progress Notes (Signed)
Nuclear stress test is normal. Normal EF. No further cardiac work up needed.    Peter Martinique MD, [facility]

## 2014-02-10 NOTE — Discharge Summary (Signed)
Patient Name:  [name]  MRN: [id number]  PCP: Axel Filler, MD  DOB:  [birth year]       Date of Admission:  02/09/2014  Date of Discharge:  02/10/2014      Attending Physician: Dr. Thayer Headings, MD         DISCHARGE DIAGNOSES: 1. Chest pain 2. Hypertension 3. Chronic kidney disease stage 4 4. GERD   DISPOSITION AND FOLLOW-UP: [name] is to follow-up with the listed providers as detailed below, at patient's visiting, please address following issues:   Follow-up Information    Follow up with Clinton Gallant, MD On 02/17/2014.   Specialty:  Internal Medicine   Contact information:   [place] [address] [phone]      Discharge Instructions    Call MD for:  difficulty breathing, headache or visual disturbances    Complete by:  As directed      Call MD for:  extreme fatigue    Complete by:  As directed      Call MD for:  hives    Complete by:  As directed      Call MD for:  persistant dizziness or light-headedness    Complete by:  As directed      Call MD for:  persistant nausea and vomiting    Complete by:  As directed      Call MD for:  redness, tenderness, or signs of infection (pain, swelling, redness, odor or green/yellow discharge around incision site)    Complete by:  As directed      Call MD for:  severe uncontrolled pain    Complete by:  As directed      Call MD for:  temperature >100.4    Complete by:  As directed      Diet - low sodium heart healthy    Complete by:  As directed      Increase activity slowly    Complete by:  As directed             DISCHARGE MEDICATIONS:   Medication List    STOP taking these medications        HYDROcodone-acetaminophen 5-325 MG per tablet  Commonly known as:  NORCO/VICODIN      TAKE these medications        amLODipine 10 MG tablet  Commonly known as:  NORVASC  Take 1 tablet (10 mg total) by mouth at bedtime.     ASPERCREME EX  Apply 1 application topically daily as  needed (hand pain).     atenolol 25 MG tablet  Commonly known as:  TENORMIN  Take 1 tablet (25 mg total) by mouth at bedtime.     buPROPion 150 MG 24 hr tablet  Commonly known as:  WELLBUTRIN XL  Take 1 tablet (150 mg total) by mouth daily.     CENTRUM SILVER ADULT 50+ PO  Take 1 tablet by mouth daily.     cinacalcet 30 MG tablet  Commonly known as:  SENSIPAR  Take 1 tablet (30 mg total) by mouth daily. With meals.     diphenhydramine-acetaminophen 25-500 MG Tabs  Commonly known as:  TYLENOL PM  Take 1 tablet by mouth at bedtime as needed (sleep).     furosemide 80 MG tablet  Commonly known as:  LASIX  Take 0.5 tablets (40 mg total) by mouth daily.     meclizine 12.5 MG tablet  Commonly known as:  ANTIVERT  Take 12.5 mg by mouth 3 (three) times daily as needed for dizziness or nausea.     omeprazole 20 MG capsule  Commonly known as:  PRILOSEC  Take 1 capsule (20 mg total) by mouth 2 (two) times daily.     traMADol 50 MG tablet  Commonly known as:  ULTRAM  Take 2 tablets (100 mg total) by mouth every 12 (twelve) hours as needed for severe pain.         CONSULTS:   Cardiology  PROCEDURES PERFORMED:  Dg Chest 2 View  02/09/2014   CLINICAL DATA:  Three-day history of short sternal chest pain and pressure  EXAM: CHEST  2 VIEW  COMPARISON:  March 12, 2012  FINDINGS: There is evidence of a degree of underlying emphysematous change. There is no edema or consolidation. Heart size and pulmonary vascularity are within normal limits. No adenopathy. There is degenerative change in the thoracic spine.  IMPRESSION: Underlying emphysematous change.  No edema or consolidation.   Electronically Signed   By: Lowella Grip III M.D.   On: 02/09/2014 12:49    Nm Myocar Multi W/spect W/wall Motion / Ef  02/10/2014   HISTORY OF PRESENT ILLNESS: Nuclear Med Background  Indication for Stress Test:  Chest pain  History:  This is a 80 y.o. female with a past medical history significant  for hypertension, renal failure with unknown etiology, formally on hemodialysis, history of gastritis with Helicobacter pylori, pericardial effusion with a question of possible collagen vascular disease, however rheumatologic diagnosis cannot be confirmed. She has no history of coronary disease. She had an echocardiogram in 2014 which interestingly showed mild LVH, EF of 60%, and upper normal left atrial size.  She presents today with a couple episodes of chest discomfort. The first happened a few days ago and was short lasting. She described it as a squeezing in the center of the chest, there was some nausea and associated diaphoresis. This episode recurred today and again felt like a pressure in the chest that lasted for 15-20 minutes. There is associated with a hot and flushed feeling across the chest and radiated up to her neck. It was not at exertion or relieved by rest. She had not eaten and only drank a half a cup of coffee in the morning. She reports that she has not missed any of her medications for reflux. She thinks the symptoms are different than her prior reflux symptoms.  Cardiac Risk Factors:  HTN, CKD  Symptoms:  Chest pain  PROCEDURE: Nuclear Pre-Procedure  Caffeine/Decaff Intake:  NPO After: MN.  Lungs:  O2 Stat:  IV 0.9% NS with Angio Cath:  Chest Size (in):  Cup Size:  Height: 5'2"  Weight: 161  BMI:  Tech Comments:  Nuclear Med Study  1 or 2 day study: 1  Stress Test Type: Lexiscan  Reading MD: Hilty  Order Authorizing Provider: Comer  Resting Radionuclide: Sestamibi  Resting Radionuclide Dose:  10 mCi  Stress Radionuclide: Sestamibi  Stress Radionuclide Dose:  30 mCi  Stress Protocol  Rest HR: 78  Stress HR: 90  Rest BP: 143/82  Stress BP: 154/79  Exercise Time (min):  METS:  Dose of Adenosine (mg):  Dose of Lexiscan:  0.4 mg  Dose of Atropine (mg):  Dose of Dobutamine:  Stress Test Technologist:  Nuclear Technologist:  Rest Procedure:  Stress Procedure:  Transient Ischemic Dilatation (Normal  <1.22): 0.6  Lung/Heart Ratio (Normal <0.45):  QGS EDV: 46  QGS ESV: 13  LV Ejection  Fraction: 72%  Rest ECG: NSR  Stress ECG:  No changes  Raw Data Images:  No significant artifacts  Stress Images:  No reversible ischemia  Rest Images:  No reversible ischemia  Subtraction (SDS):  0  IMPRESSION: Exercise Capacity: N/A  BP Response: Normal  Clinical Symptoms: None  ECG Impression:  No lexiscan changes  Comparison with Prior Nuclear Study: None  Final Impression:  Normal stress nuclear perfusion study.  LVEF 72%   Electronically Signed   By: Pixie Casino   On: 02/10/2014 13:16      ADMISSION DATA: Admission H&P HPI: [name] is a 80 year old woman with HTN, CKD stage 4, s/p pericardial effusion window 11/2004 who presents with chest pain. She had never had an episode like this until this past Saturday 2/6 when she at rest noted substernal non-radiating chest pain she described as pressure that lasted a few minutes and went away on its own. She decided not to go to the hospital and returned to normal health until this morning. She was at rest when she again had this pressure sensation and it lasted fifteen minutes. She reports associated nausea without emesis, diaphoresis and sometimes lightheadedness during these episodes. They are not related to exertion and she is unsure of any other aggravating or alleviating factors. She's never had this before.  She also notes that a few weeks ago she had R>L LE swelling. She was seen in 01/20/14 [facility] clinic for bilateral ankle pain thought to be related to arthritis with tramadol increased and a referral to sports medicine. This was after she was seen in the ED 1/17 for ankle pain and joint swelling. She notes she has been taking lasix 40 mg bid (prescribed daily) for a few weeks with no documentation that she was told to increase. She says her swelling has improved.  In the ED, she received ASA 324 mg po once.  Physical Exam: Blood pressure 138/74, pulse 62,  temperature 97.9 F (36.6 C), temperature source Oral, resp. rate 14, SpO2 97 %.  Gen: A&O x 4, no acute distress, well developed, well nourished HEENT: Atraumatic, PERRL, EOMI, sclerae anicteric, moist mucous membranes Heart: Regular rate and rhythm, normal S1 S2, no murmurs, rubs, or gallops, no tenderness on chest wall, well healed midline scar below sternum from pericardial window Lungs: Clear to auscultation bilaterally, respirations unlabored Abd: Soft, non-tender, non-distended, + bowel sounds, no hepatosplenomegaly Ext: B/l 1+ pitting edema through ankles, L arm fistula without thrill  Labs: Basic Metabolic Panel:  Recent Labs (last 2 labs)      Recent Labs  02/09/14 1117  NA 139  K 4.7  CL 101  CO2 27  GLUCOSE 99  BUN 23  CREATININE 2.35*  CALCIUM 9.3     CBC:  Recent Labs (last 2 labs)      Recent Labs  02/09/14 1117  WBC 5.1  HGB 12.0  HCT 37.5  MCV 94.7  PLT 263     Cardiac Enzymes:  Recent Labs (last 2 labs)      Recent Labs  02/09/14 1430  TROPONINI 0.16*    i-stat trop 0.02, 0.01     HOSPITAL COURSE: Atypcial chest pain:  [name] did not have any EKG change but she did report nausea, vomiting and diaphoresis, had a TIME score of 4, elevated troponin of 0.16 and cardiac risk factors so she was admitted for ACS rule out.  Cardiology was consulted and she underwent myoview on 02/10/2014 which was  interpreted as normal and revealed an LVEF of 72%.  Her chest pain resolved by hospital day 2 and she felt well.  She did not have any further vomiting and was eager to eat after her myoview.  Given resolution of symptoms and normal stress myoview no further cardiac work-up was recommended by cardiology.  She was discharged home in stable condition.  She will follow-up in Internal Medicine [place] on 02/17/14.  Hypertension:  Atenolol was initially held due to bradycardia.  However, this medication was resumed on hospital day  2 when HR was normal.   Amlodipine was continued.  No changes were made to her medications at discharge.   Chronic kidney disease, stage 4: The patient says she was previously on HD for a few years but this was stopped about four years ago.  She follows with Dr. Moshe Cipro with [facility].  Her creatinine was at baseline during admission.   GERD:  PPI was continued during admission.  DISCHARGE DATA: Vital Signs: BP 161/75 mmHg  Pulse 63  Temp(Src) 98.4 F (36.9 C) (Oral)  Resp 18  Ht 5\' 2"  (1.575 m)  Wt 73.1 kg (161 lb 2.5 oz)  BMI 29.47 kg/m2  SpO2 100%  Labs: Results for orders placed or performed during the hospital encounter of 02/09/14 (from the past 24 hour(s))  Troponin I (q 6hr x 3)     Status: None   Collection Time: 02/09/14  9:24 PM  Result Value Ref Range   Troponin I <0.03 <0.031 ng/mL  Troponin I (q 6hr x 3)     Status: None   Collection Time: 02/10/14  2:19 AM  Result Value Ref Range   Troponin I <0.03 <0.031 ng/mL  Basic metabolic panel     Status: Abnormal   Collection Time: 02/10/14  2:19 AM  Result Value Ref Range   Sodium 139 135 - 145 mmol/L   Potassium 4.2 3.5 - 5.1 mmol/L   Chloride 104 96 - 112 mmol/L   CO2 25 19 - 32 mmol/L   Glucose, Bld 99 70 - 99 mg/dL   BUN 24 (H) 6 - 23 mg/dL   Creatinine, Ser 2.06 (H) 0.50 - 1.10 mg/dL   Calcium 9.1 8.4 - 10.5 mg/dL   GFR calc non Af Amer 22 (L) >90 mL/min   GFR calc Af Amer 26 (L) >90 mL/min   Anion gap 10 5 - 15  Lipid panel     Status: None   Collection Time: 02/10/14  2:19 AM  Result Value Ref Range   Cholesterol 173 0 - 200 mg/dL   Triglycerides 136 <150 mg/dL   HDL 54 >39 mg/dL   Total CHOL/HDL Ratio 3.2 RATIO   VLDL 27 0 - 40 mg/dL   LDL Cholesterol 92 0 - 99 mg/dL     Services Ordered on Discharge: Y = Yes; Blank = No PT:   OT:   RN:   Equipment:   Other:      Time Spent on Discharge: 35 min   Signed: Duwaine Maxin PGY 2, Internal Medicine Resident 02/10/2014,  4:06 PM

## 2014-02-11 LAB — HEMOGLOBIN A1C
Hgb A1c MFr Bld: 6.1 % — ABNORMAL HIGH (ref 4.8–5.6)
Mean Plasma Glucose: 128 mg/dL

## 2014-02-11 NOTE — Telephone Encounter (Signed)
reviewed

## 2014-02-12 ENCOUNTER — Encounter: Payer: Self-pay | Admitting: Sports Medicine

## 2014-02-12 ENCOUNTER — Ambulatory Visit (INDEPENDENT_AMBULATORY_CARE_PROVIDER_SITE_OTHER): Payer: Medicare Other | Admitting: Sports Medicine

## 2014-02-12 VITALS — BP 139/88 | HR 56 | Ht 62.0 in | Wt 169.0 lb

## 2014-02-12 DIAGNOSIS — M25571 Pain in right ankle and joints of right foot: Secondary | ICD-10-CM

## 2014-02-12 DIAGNOSIS — M79645 Pain in left finger(s): Secondary | ICD-10-CM

## 2014-02-12 DIAGNOSIS — M79644 Pain in right finger(s): Secondary | ICD-10-CM | POA: Diagnosis not present

## 2014-02-12 NOTE — Assessment & Plan Note (Signed)
Right first [place] joint arthritis. -Discussed that joint injection would be an option, but she wished to hold off at this time. -She will continue topical Aspercreme as needed. -Discussed that if her pain becomes troublesome enough, that either injection or meeting with hand orthopedic surgery would be an option.

## 2014-02-12 NOTE — Assessment & Plan Note (Signed)
Left first CMC joint arthritis. -Discussed that joint injection would be an option, but she wished to hold off at this time. -She will continue topical Aspercreme as needed. -Discussed that if her pain becomes troublesome enough, that either injection or meeting with hand orthopedic surgery would be an option.

## 2014-02-12 NOTE — Assessment & Plan Note (Signed)
Left greater than right pes planus with hyperpronation. Right medial ankle joint pain, likely related to arthritis of the ankle joint. History of bilateral bunion surgery. -Treatment options were discussed with the patient today. -Modifications to her insoles were made with fitting of bilateral medium scaphoid pads. -A body helix compression sleeve was fitted and applied to the right ankle. She tolerated this well, and feels that it would not aggravate her bilateral CMC joint arthritis. -We will see how she tolerates the insole modifications, and see her back as needed. If her ankle joint pain persists and is increasingly troublesome, we may consider a corticosteroid injection into the ankle as an option if she is interested. -Follow-up as needed

## 2014-02-12 NOTE — Progress Notes (Signed)
   Subjective:    Patient ID: [name], female    DOB: [birth year], 80 y.o.   MRN: [id number]  HPI [name] is a 80 year old pleasant female who presents with bilateral ankle pain, right greater than left. She has had chronic bilateral ankle, bilateral knee, and bilateral CMC joint pain related to arthritis. She denies any acute injury that triggered her ankle pain, but does say her water aerobics class had been doing impact resistance jumping from the bottom of the pool. She says that her instructor brought her in somewhat early so that they could do exercises in the deep and and avoid the impact activity after she made her aware of her increasing pain. Her symptoms are aggravated with activities such as walking. She denies any pain that wakes her up at night. She is using topical Aspercreme with some relief. She notices intermittent bilateral ankle and knee swelling. She denies any rash, warmth, fever, chills, numbness, or tingling. She has a history of bilateral bunion surgery.  Past medical history, social history, medications, and allergies were reviewed and are up to date in the chart.  Review of Systems 7 point review of systems was performed and was otherwise negative unless noted in the history of present illness.     Objective:   Physical Exam BP 139/88 mmHg  Pulse 56  Ht 5\' 2"  (1.575 m)  Wt 169 lb (76.658 kg)  BMI 30.90 kg/m2 GEN: The patient is well-developed well-nourished female and in no acute distress.  She is awake alert and oriented x3. SKIN: warm and well-perfused, no rash  EXTR: No lower extremity edema or calf tenderness Neuro: Strength 5/5 globally. Sensation intact throughout. No focal deficits. Vasc: +2 bilateral distal pulses. No edema.  MSK: Examination of the bilateral ankles reveals flattened medial longitudinal arch worse on the left. She has subluxation of the second toes bilaterally over the first toe. Hyperpronation of the left ankle. Mild pain located  at the lateral ankle joint with associated sinus tarsi swelling. Examination of the right ankle reveals fairly well-preserved medial longitudinal arch. She is very first ray dominant on the right. She is tender to palpation over the medial ankle joint. Less swelling compared to left. She has fairly flexible ankles considering her age. Examination of the right hand and wrist reveals tenderness to palpation over the right first [facility] joint with bony prominence bilaterally. No localized swelling, warmth, or induration.     Assessment & Plan:  Please see problem based assessment and plan in the problem list.

## 2014-02-17 ENCOUNTER — Ambulatory Visit: Payer: Medicare Other | Admitting: Internal Medicine

## 2014-02-21 ENCOUNTER — Emergency Department (HOSPITAL_COMMUNITY): Payer: Medicare Other

## 2014-02-21 ENCOUNTER — Encounter (HOSPITAL_COMMUNITY): Payer: Self-pay | Admitting: *Deleted

## 2014-02-21 ENCOUNTER — Emergency Department (HOSPITAL_COMMUNITY)
Admission: EM | Admit: 2014-02-21 | Discharge: 2014-02-21 | Disposition: A | Discharge status: Home / Self Care | Payer: Medicare Other | Attending: Emergency Medicine | Admitting: Emergency Medicine

## 2014-02-21 DIAGNOSIS — R609 Edema, unspecified: Secondary | ICD-10-CM | POA: Diagnosis not present

## 2014-02-21 DIAGNOSIS — G8929 Other chronic pain: Secondary | ICD-10-CM | POA: Diagnosis not present

## 2014-02-21 DIAGNOSIS — Z79899 Other long term (current) drug therapy: Secondary | ICD-10-CM | POA: Diagnosis not present

## 2014-02-21 DIAGNOSIS — M79621 Pain in right upper arm: Secondary | ICD-10-CM | POA: Diagnosis not present

## 2014-02-21 DIAGNOSIS — Z8669 Personal history of other diseases of the nervous system and sense organs: Secondary | ICD-10-CM | POA: Insufficient documentation

## 2014-02-21 DIAGNOSIS — F329 Major depressive disorder, single episode, unspecified: Secondary | ICD-10-CM | POA: Diagnosis not present

## 2014-02-21 DIAGNOSIS — R011 Cardiac murmur, unspecified: Secondary | ICD-10-CM | POA: Diagnosis not present

## 2014-02-21 DIAGNOSIS — M25422 Effusion, left elbow: Secondary | ICD-10-CM | POA: Diagnosis not present

## 2014-02-21 DIAGNOSIS — M79602 Pain in left arm: Secondary | ICD-10-CM | POA: Diagnosis not present

## 2014-02-21 DIAGNOSIS — Z862 Personal history of diseases of the blood and blood-forming organs and certain disorders involving the immune mechanism: Secondary | ICD-10-CM | POA: Insufficient documentation

## 2014-02-21 DIAGNOSIS — I10 Essential (primary) hypertension: Secondary | ICD-10-CM | POA: Diagnosis not present

## 2014-02-21 DIAGNOSIS — Z87891 Personal history of nicotine dependence: Secondary | ICD-10-CM | POA: Insufficient documentation

## 2014-02-21 DIAGNOSIS — R112 Nausea with vomiting, unspecified: Secondary | ICD-10-CM | POA: Diagnosis not present

## 2014-02-21 DIAGNOSIS — K219 Gastro-esophageal reflux disease without esophagitis: Secondary | ICD-10-CM | POA: Diagnosis not present

## 2014-02-21 DIAGNOSIS — Z87448 Personal history of other diseases of urinary system: Secondary | ICD-10-CM | POA: Insufficient documentation

## 2014-02-21 DIAGNOSIS — Z8619 Personal history of other infectious and parasitic diseases: Secondary | ICD-10-CM | POA: Insufficient documentation

## 2014-02-21 DIAGNOSIS — M25522 Pain in left elbow: Secondary | ICD-10-CM | POA: Diagnosis not present

## 2014-02-21 DIAGNOSIS — M1990 Unspecified osteoarthritis, unspecified site: Secondary | ICD-10-CM | POA: Insufficient documentation

## 2014-02-21 HISTORY — DX: Other chronic pain: G89.29

## 2014-02-21 HISTORY — DX: Pain in unspecified finger(s): M79.646

## 2014-02-21 HISTORY — DX: Pain in unspecified knee: M25.569

## 2014-02-21 HISTORY — DX: Pain in unspecified ankle and joints of unspecified foot: M25.579

## 2014-02-21 LAB — BASIC METABOLIC PANEL
Anion gap: 8 (ref 5–15)
BUN: 26 mg/dL — ABNORMAL HIGH (ref 6–23)
CO2: 26 mmol/L (ref 19–32)
Calcium: 8.7 mg/dL (ref 8.4–10.5)
Chloride: 103 mmol/L (ref 96–112)
Creatinine, Ser: 2.51 mg/dL — ABNORMAL HIGH (ref 0.50–1.10)
GFR calc Af Amer: 20 mL/min — ABNORMAL LOW (ref >90)
GFR calc non Af Amer: 17 mL/min — ABNORMAL LOW (ref >90)
Glucose, Bld: 103 mg/dL — ABNORMAL HIGH (ref 70–99)
Potassium: 3.9 mmol/L (ref 3.5–5.1)
Sodium: 137 mmol/L (ref 135–145)

## 2014-02-21 LAB — CBC WITH DIFFERENTIAL/PLATELET
Basophils Absolute: 0 10*3/uL (ref 0.0–0.1)
Basophils Relative: 0 % (ref 0–1)
Eosinophils Absolute: 0.3 10*3/uL (ref 0.0–0.7)
Eosinophils Relative: 5 % (ref 0–5)
HCT: 33.8 % — ABNORMAL LOW (ref 36.0–46.0)
Hemoglobin: 10.9 g/dL — ABNORMAL LOW (ref 12.0–15.0)
Lymphocytes Relative: 22 % (ref 12–46)
Lymphs Abs: 1.4 10*3/uL (ref 0.7–4.0)
MCH: 29.6 pg (ref 26.0–34.0)
MCHC: 32.2 g/dL (ref 30.0–36.0)
MCV: 91.8 fL (ref 78.0–100.0)
Monocytes Absolute: 0.5 10*3/uL (ref 0.1–1.0)
Monocytes Relative: 7 % (ref 3–12)
Neutro Abs: 4.2 10*3/uL (ref 1.7–7.7)
Neutrophils Relative %: 66 % (ref 43–77)
Platelets: 200 10*3/uL (ref 150–400)
RBC: 3.68 MIL/uL — ABNORMAL LOW (ref 3.87–5.11)
RDW: 13.4 % (ref 11.5–15.5)
WBC: 6.4 10*3/uL (ref 4.0–10.5)

## 2014-02-21 LAB — SEDIMENTATION RATE: Sed Rate: 45 mm/hr — ABNORMAL HIGH (ref 0–22)

## 2014-02-21 LAB — SYNOVIAL CELL COUNT + DIFF, W/ CRYSTALS
Crystals, Fluid: NONE SEEN
Eosinophils-Synovial: 0 % (ref 0–1)
Lymphocytes-Synovial Fld: 4 % (ref 0–20)
Monocyte-Macrophage-Synovial Fluid: 28 % — ABNORMAL LOW (ref 50–90)
Neutrophil, Synovial: 68 % — ABNORMAL HIGH (ref 0–25)
Other Cells-SYN: 0
WBC, Synovial: 1342 /mm3 — ABNORMAL HIGH (ref 0–200)

## 2014-02-21 LAB — I-STAT CG4 LACTIC ACID, ED: Lactic Acid, Venous: 0.68 mmol/L (ref 0.5–2.0)

## 2014-02-21 LAB — D-DIMER, QUANTITATIVE: D-Dimer, Quant: 1.1 ug/mL-FEU — ABNORMAL HIGH (ref 0.00–0.48)

## 2014-02-21 LAB — CK: Total CK: 124 U/L (ref 7–177)

## 2014-02-21 LAB — TROPONIN I: Troponin I: <0.03 ng/mL (ref <0.031)

## 2014-02-21 LAB — GLUCOSE, SYNOVIAL FLUID: Glucose, Synovial Fluid: 104 mg/dL

## 2014-02-21 MED ORDER — HYDROCODONE-ACETAMINOPHEN 5-325 MG PO TABS
1.0000 | ORAL_TABLET | Freq: Once | ORAL | Status: AC
Start: 2014-02-21 — End: 2014-02-21
  Administered 2014-02-21: 1 via ORAL
  Filled 2014-02-21: qty 1

## 2014-02-21 MED ORDER — HYDROMORPHONE HCL 1 MG/ML IJ SOLN: 1.0000 mg | Freq: Once | INTRAMUSCULAR | Status: DC

## 2014-02-21 MED ORDER — LIDOCAINE HCL (PF) 1 % IJ SOLN
5.0000 mL | Freq: Once | INTRAMUSCULAR | Status: AC
  Administered 2014-02-21: 5 mL
  Filled 2014-02-21: qty 5

## 2014-02-21 MED ORDER — HYDROMORPHONE HCL 1 MG/ML IJ SOLN
0.5000 mg | Freq: Once | INTRAMUSCULAR | Status: AC
  Administered 2014-02-21: 0.5 mg via INTRAVENOUS
  Filled 2014-02-21: qty 1

## 2014-02-21 MED ORDER — OXYCODONE-ACETAMINOPHEN 5-325 MG PO TABS: ORAL_TABLET | ORAL | Status: DC

## 2014-02-21 MED ORDER — HYDROMORPHONE HCL 1 MG/ML IJ SOLN
1.0000 mg | Freq: Once | INTRAMUSCULAR | Status: AC
  Administered 2014-02-21: 1 mg via INTRAVENOUS
  Filled 2014-02-21: qty 1

## 2014-02-21 MED ORDER — OXYCODONE-ACETAMINOPHEN 5-325 MG PO TABS
1.0000 | ORAL_TABLET | Freq: Once | ORAL | Status: AC
Start: 2014-02-21 — End: 2014-02-21
  Administered 2014-02-21: 1 via ORAL
  Filled 2014-02-21: qty 1

## 2014-02-21 MED ORDER — ONDANSETRON HCL 4 MG/2ML IJ SOLN
4.0000 mg | Freq: Once | INTRAMUSCULAR | Status: AC
  Administered 2014-02-21: 4 mg via INTRAVENOUS
  Filled 2014-02-21: qty 2

## 2014-02-21 NOTE — ED Notes (Signed)
Dr Ankit at bedside. 

## 2014-02-21 NOTE — ED Provider Notes (Signed)
Pt received at sign out with Vasc US LUE and synovial fluid left elbow pending. Preliminary results of both as below (will take longer for the rest of the synovial fluid tests to result). Pt more comfortable after pain meds.  VS remain stable/afebrile. T/C to Ortho Dr. Erlinda Hong, case discussed, including:  HPI, pertinent PM/SHx, VS/PE, dx testing, ED course and treatment:  States synovial fluid c/w noninflammatory arthritis picture (given WBC count is near 1000), pt can be tx symptomatically for pain at this time, and can have pt f/u in office this week. Pt wants to go home now. Dx and testing, as well as d/w Ortho MD, d/w pt.  Questions answered.  Verb understanding, agreeable to d/c home with outpt f/u.   VASCULAR LAB PRELIMINARY PRELIMINARY PRELIMINARY PRELIMINARY Left upper extremity venous Doppler completed.  Preliminary report: There is no DVT or SVT noted in the left upper extremity. KANADY, CANDACE, RVT 02/21/2014, 10:10 AM  Results for orders placed or performed during the hospital encounter of 02/21/14  Synovial cell count + diff, w/ crystals  Result Value Ref Range   Color, Synovial PINK (A) YELLOW   Appearance-Synovial HAZY (A) CLEAR   Crystals, Fluid NO CRYSTALS SEEN    WBC, Synovial 1342 (H) 0 - 200 /cu mm   Neutrophil, Synovial 68 (H) 0 - 25 %   Lymphocytes-Synovial Fld 4 0 - 20 %   Monocyte-Macrophage-Synovial Fluid 28 (L) 50 - 90 %   Eosinophils-Synovial 0 0 - 1 %   Other Cells-SYN 0          Francine Graven, DO 02/21/14 1134

## 2014-02-21 NOTE — ED Provider Notes (Addendum)
CSN: [id number]     Arrival date & time 02/21/14  0248 History   This chart was scribed for Nichole Biles, MD by [name], ED Scribe. This patient was seen in room A07C/A07C and the patient's care was started at 3:16 AM.    Chief Complaint  Patient presents with  . Arm Pain      Patient is a 80 y.o. female presenting with arm pain. The history is provided by the patient. No language interpreter was used.  Arm Pain Pertinent negatives include no chest pain and no shortness of breath.   HPI Comments: [name] is a 80 y.o. female with PMHx HTN, anemia, osteoporosis, epicondylitis, arthritis, and renal failure who presents to the Emergency Department complaining of severe throbbing left elbow pain with gradual worsening onset yesterday morning.  Pt notes pt worsened around 9 PM. Pt has a dialysis fistula in the area, which has been unused in 5 years. Pt is no longer on dialysis. Pt notes associated nausea and vomiting due to pain. Pt uncomfortable in exam room. Pt denies fever, back pain, chest pain, and SOB, numbness, tingling.  Past Medical History  Diagnosis Date  . Depression   . Hypertension   . Gastritis, Helicobacter pylori     Noted on EGD 01/18/2009 by [name];  biopsy showed chronic gastritis with Helicobacter pylori, treated by [name].  . Pericardial effusion 11/2004    S/P subxiphoid pericardial window 11/01/2004 by [name].  A question of possible collagen vascular disease had been raised in 2006 due to arthralgias, a history of idiopathic pericarditis, and increased pigmentation of her palms, and she was referred to [name] but he was unable to confirm a rheumatologic diagnosis.  [name] Hemorrhoids, internal 05/09/2005     Found on colonoscopy 05/09/2005 by [name]  . Cataracts, bilateral   . Pinguecula   . Vitreous detachment   . Thigh pain   . Anemia, iron deficiency   . Angiodysplasia of colon 05/09/2005    Single small  angiodysplastic lesion in the descending colon found on colonoscopy 05/09/2005 by [name]  . Anemia due to blood loss, chronic   . Stiffness of joint, not elsewhere classified, hand   . Osteoporosis, unspecified 03/06/2012    A DXA bone density scan done 02/20/2012 showed osteoporosis with a lumbar spine T-score of -4.4 and a right femur T-score of -2.8.  Patient has chronic kidney disease with estimated GFR less than 30, and it is not clear how much of her osteoporosis may be due to renal osteodystrophy.    [name] GERD (gastroesophageal reflux disease)   . Epicondylitis   . Arthritis     "knots on my thumbs" (02/09/2014)  . Renal failure     Formerly on hemodialysis; managed by Dr. Corliss Parish   Past Surgical History  Procedure Laterality Date  . Subxyphoid pericardial window  11/01/2004  . Arteriovenous graft placement Left 11/28/2005    forearm arteriovenous  . Thrombectomy / arteriovenous graft revision Left 01/21/2006    Thrombectomy and revision of left forearm loop AV graft.  . Thrombectomy Left  03/06/2006    Simple thrombectomy arteriovenous Gore-Tex graft, left arm, with exploration of venous end and intraoperative shuntogram.         . Hallux valgus correction Left 01/2012    foot  . Abdominal hysterectomy  1960's   Family History  Problem Relation Age of Onset  . Hypertension Brother   . Heart  disease Father   . Diabetes Brother   . Breast cancer Neg Hx   . Colon cancer Neg Hx   . Lung cancer Neg Hx   . Cervical cancer Neg Hx   . Sickle cell anemia Neg Hx    History  Substance Use Topics  . Smoking status: Former Smoker -- 0.10 packs/day for 1 years    Types: Cigarettes    Quit date: 01/02/1960  . Smokeless tobacco: Never Used  . Alcohol Use: No   OB History    No data available     Review of Systems  Constitutional: Negative for fever.  Respiratory: Negative for shortness of breath.   Cardiovascular: Negative for chest pain.  Gastrointestinal:  Positive for nausea and vomiting.  Musculoskeletal: Positive for myalgias and arthralgias. Negative for back pain.  Skin: Negative for rash.  All other systems reviewed and are negative.     Allergies  Ibuprofen  Home Medications   Prior to Admission medications   Medication Sig Start Date End Date Taking? Authorizing Provider  amLODipine (NORVASC) 10 MG tablet Take 1 tablet (10 mg total) by mouth at bedtime. 01/07/13  Yes Axel Filler, MD  atenolol (TENORMIN) 25 MG tablet Take 1 tablet (25 mg total) by mouth at bedtime. 01/07/13  Yes Axel Filler, MD  buPROPion (WELLBUTRIN XL) 150 MG 24 hr tablet Take 1 tablet (150 mg total) by mouth daily. 12/21/13  Yes Axel Filler, MD  cinacalcet (SENSIPAR) 30 MG tablet Take 1 tablet (30 mg total) by mouth daily. With meals. 01/07/13  Yes Axel Filler, MD  diphenhydramine-acetaminophen (TYLENOL PM) 25-500 MG TABS Take 1 tablet by mouth at bedtime as needed (sleep).   Yes Historical Provider, MD  furosemide (LASIX) 80 MG tablet Take 0.5 tablets (40 mg total) by mouth daily. 01/07/13  Yes Axel Filler, MD  meclizine (ANTIVERT) 12.5 MG tablet Take 12.5 mg by mouth daily.    Yes Historical Provider, MD  Multiple Vitamins-Minerals (CENTRUM SILVER ADULT 50+ PO) Take 1 tablet by mouth daily.   Yes Historical Provider, MD  omeprazole (PRILOSEC) 20 MG capsule Take 1 capsule (20 mg total) by mouth 2 (two) times daily. Patient taking differently: Take 20 mg by mouth daily.  01/07/13  Yes Axel Filler, MD  traMADol (ULTRAM) 50 MG tablet Take 2 tablets (100 mg total) by mouth every 12 (twelve) hours as needed for severe pain. 01/29/14  Yes Arman Filter, MD  Trolamine Salicylate (ASPERCREME EX) Apply 1 application topically daily as needed (hand pain).   Yes Historical Provider, MD   BP 163/60 mmHg  Pulse 78  Temp(Src) 98 F (36.7 C) (Oral)  Resp 20  Ht 5\' 2"  (1.575 m)  Wt 169 lb (76.658 kg)  BMI 30.90 kg/m2  SpO2 100% Physical Exam   Constitutional: She is oriented to person, place, and time. She appears well-developed and well-nourished.  HENT:  Head: Normocephalic.  Eyes: Conjunctivae are normal.  Neck: Normal range of motion. Neck supple. No JVD present.  Distended neck veins  Cardiovascular: Normal rate, regular rhythm and intact distal pulses.   Murmur (systolic) heard. Pulses:      Radial pulses are 2+ on the right side, and 2+ on the left side.  Pulmonary/Chest: Effort normal and breath sounds normal.  Musculoskeletal:       Left elbow: She exhibits decreased range of motion. She exhibits no swelling. Tenderness found.  Grossly mild swelling appreciated. Old fistula on left forearm with palpable  bruit. No effusion appreciated on elbow exam however pt has significant TTP over the radial head and the forearm muscle. No signs of bursitis.   Neurological: She is alert and oriented to person, place, and time.  Normal sensory exam  Skin: Skin is warm and dry.  Psychiatric: She has a normal mood and affect. Her behavior is normal.  Nursing note and vitals reviewed.   ED Course  ARTHOCENTESIS Date/Time: 02/21/2014 7:24 AM Performed by: [name] Authorized by: [name] Consent: Verbal consent obtained. Risks and benefits: risks, benefits and alternatives were discussed Consent given by: patient Patient understanding: patient states understanding of the procedure being performed Site marked: the operative site was marked Imaging studies: imaging studies available Patient identity confirmed: verbally with patient Indications: pain,  possible septic joint,  joint swelling and diagnostic evaluation  Body area: elbow Joint: left elbow Local anesthesia used: yes Anesthesia: local infiltration Local anesthetic: lidocaine 1% without epinephrine Anesthetic total: 5 ml Patient sedated: no Preparation: Patient was prepped and draped in the usual sterile fashion. Needle gauge: 18 G Ultrasound  guidance: no Approach: lateral Aspirate: blood-tinged Aspirate amount: 8 mL Patient tolerance: Patient tolerated the procedure well with no immediate complications   (including critical care time) DIAGNOSTIC STUDIES: Oxygen Saturation is 99% on room air, normal by my interpretation.    COORDINATION OF CARE: 3:28 AM Discussed treatment plan with patient at beside, the patient agrees with the plan and has no further questions at this time.   Labs Review Labs Reviewed  CBC WITH DIFFERENTIAL/PLATELET - Abnormal; Notable for the following:    RBC 3.68 (*)    Hemoglobin 10.9 (*)    HCT 33.8 (*)    All other components within normal limits  BASIC METABOLIC PANEL - Abnormal; Notable for the following:    Glucose, Bld 103 (*)    BUN 26 (*)    Creatinine, Ser 2.51 (*)    GFR calc non Af Amer 17 (*)    GFR calc Af Amer 20 (*)    All other components within normal limits  SEDIMENTATION RATE - Abnormal; Notable for the following:    Sed Rate 45 (*)    All other components within normal limits  D-DIMER, QUANTITATIVE - Abnormal; Notable for the following:    D-Dimer, Quant 1.10 (*)    All other components within normal limits  BODY FLUID CULTURE  TROPONIN I  CK  SYNOVIAL CELL COUNT + DIFF, W/ CRYSTALS  SYNOVIAL FLUID, CRYSTAL  URIC ACID, SYNOVIAL FLUID  PROTEIN, SYNOVIAL FLUID  GLUCOSE, SYNOVIAL FLUID  I-STAT CG4 LACTIC ACID, ED    Imaging Review Dg Elbow Complete Left  02/21/2014   CLINICAL DATA:  Severe pain in left elbow for 1 day.  No known in  EXAM: LEFT ELBOW - COMPLETE 3+ VIEW  COMPARISON:  None.  FINDINGS: There is a prominent anterior fat pad, indicative of joint effusion or hemarthrosis. No fracture is evident. There is no bone lesion or bony destruction.  IMPRESSION: Left elbow joint effusion   Electronically Signed   By: Andreas Newport M.D.   On: 02/21/2014 06:07   Dg Chest Port 1 View  02/21/2014   CLINICAL DATA:  Sudden left arm pain  EXAM: PORTABLE CHEST - 1 VIEW   COMPARISON:  02/09/2014  FINDINGS: A single AP portable view of the chest demonstrates no focal airspace consolidation or alveolar edema. The lungs are grossly clear. There is no large effusion or pneumothorax. There is unchanged cardiomegaly. Cardiac  and mediastinal contours are otherwise unremarkable.  IMPRESSION: No active disease.   Electronically Signed   By: Andreas Newport M.D.   On: 02/21/2014 04:35     EKG Interpretation   Date/Time:  Sunday February 21 2014 03:46:04 EST Ventricular Rate:  71 PR Interval:  157 QRS Duration: 100 QT Interval:  422 QTC Calculation: 459 R Axis:   26 Text Interpretation:  Sinus rhythm no acute changes No significant change  since last tracing Confirmed by Hamsini Verrilli, MD, Pardeep Pautz ([id number]) on 02/21/2014  4:47:29 AM      7:21 AM Pain improved to 3/10. Repeat exam - no swelling, no warmth to touch, no erythema. She has a fistula -and there is a strong pulse over it. Pt 's tendneress reproduced with palpation over the elbow, forearm. Tenderness only in the area of palpation. She is able to supinate, but has tenderness with pronation, and she has tenderness with flexion of the elbow. The tenderness is in the proximal forearm. Don't suspect a joint infection. I have ordered a xray of the knee. Sed rate is not severely elevated. Dimer is slightly elevated. US duplex will be needed. All the labs otherwise are not indicative of any acute process, and again, pt continues to be neurovascularly intact. Will try to get pain in control. Adding CK to the labs. If pain responds, pt can be discharged.  7:21 AM L elbow has joint effusion. Arthrocentesis done. Pt gave verbal consent and tolerated the procedure well.   MDM   Final diagnoses:  Arm pain, anterior, left    I personally performed the services described in this documentation, which was scribed in my presence. The recorded information has been reviewed and is accurate.  Pt comes in with LUE  pain. Pain starts proximal to elbow and moves towards wrist, and is worse with pronation, supination, elbow flexion. Tenderness to palpation of all of those joints. There is no overt signs of infection. Pt has hx of HD and fistula over there. Pt is uncomfortable.  Initial concerns - vascular etiology like thrombosis, or aneurysm of the fistula, DVT. Infectious - like bursitis, septic joint. Pt also had a recent admission for chest pain and had a neg nuclear stress. Dissection was considered in the ddx - however, there is no chest pain, no back pain, no neuro complains, normal and equal pulse bilateral upper extremities - and with normal HR and normal BP - so we are really not suspicious of a disection Pt is neurovascularly intact. Will start with basic workup. Dimer will be added to look for LUE DVT.   Nichole Biles, MD 02/21/14 0726   7:52 AM Pt's care to be transferred to Dr. Thurnell Garbe. If patient has septic joint, ortho will be consulted. [place] for DVT is pending as well. Patient might need admission for pain control, even if workup is neg - she has received 3 rounds of iv meds for her pain.  Nichole Biles, MD 02/21/14 [phone]

## 2014-02-21 NOTE — ED Notes (Signed)
Arrived back from xray.  Now states my pain is back at 9/10 after having to move my arm around.  Md notified.

## 2014-02-21 NOTE — ED Notes (Signed)
The pt is c/o severe lr arm pain .   Old dialysis fistula in that lt arm.  No use for 3 years.  Her kidneys just started working againj.  No transplant.  Good radial pulse .  Severe arm pain since early this am.  Hurting sl yesterday am

## 2014-02-21 NOTE — ED Notes (Signed)
Taken to xray department via stretcher at this time.

## 2014-02-21 NOTE — ED Notes (Signed)
Patient transported to X-ray 

## 2014-02-21 NOTE — ED Notes (Signed)
Doppler at bedside.

## 2014-02-21 NOTE — Discharge Instructions (Signed)
°Emergency Department Resource Guide °1) Find a Doctor and Pay Out of Pocket °Although you won't have to find out who is covered by your insurance plan, it is a good idea to ask around and get recommendations. You will then need to call the office and see if the doctor you have chosen will accept you as a new patient and what types of options they offer for patients who are self-pay. Some doctors offer discounts or will set up payment plans for their patients who do not have insurance, but you will need to ask so you aren't surprised when you get to your appointment. ° °2) Contact Your Local Health Department °Not all health departments have doctors that can see patients for sick visits, but many do, so it is worth a call to see if yours does. If you don't know where your local health department is, you can check in your phone book. The CDC also has a tool to help you locate your state's health department, and many state websites also have listings of all of their local health departments. ° °3) Find a Walk-in Clinic °If your illness is not likely to be very severe or complicated, you may want to try a walk in clinic. These are popping up all over the country in pharmacies, drugstores, and shopping centers. They're usually staffed by nurse practitioners or physician assistants that have been trained to treat common illnesses and complaints. They're usually fairly quick and inexpensive. However, if you have serious medical issues or chronic medical problems, these are probably not your best option. ° °No Primary Care Doctor: °- Call [facility] at  832-8000 - they can help you locate a primary care doctor that  accepts your insurance, provides certain services, etc. °- Physician Referral Service- 1-800-533-3463 ° °Chronic Pain Problems: °Organization         Address  Phone   Notes  °[facility]  (336) 297-2271 Patients need to be referred by their primary care doctor.  ° °Medication  Assistance: °Organization         Address  Phone   Notes  °[place] [facility] [address] °[place], [place] [address] (336) 641-8030 --Must be a resident of [place] °-- Must have NO insurance coverage whatsoever (no Medicaid/ Medicare, etc.) °-- The pt. MUST have a primary care doctor that directs their care regularly and follows them in the community °  °[facility]  (866) 331-1348   °[employer]  (888) 892-1162   ° °Agencies that provide inexpensive medical care: °Organization         Address  Phone   Notes  °[facility]  (336) 832-8035   ° Internal Medicine    (336) 832-7272   °[facility] [address] °[place], [place] [address] (336) 832-4777   °[facility] [address], °[place] (336) 271-4999   °[facility]    (336) 373-0678   °[facility]    (336) 272-1050   °[facility] ° [address], [place] Phone:  (336) 832-4444, Fax:  (336) 832-4440 Hours of Operation:  9 am - 6 pm, M-F.  Also accepts Medicaid/Medicare and self-pay.  °[facility] ° [address], [place] Phone: (336) 832-3150, Fax: (336) 832-3151. Hours of Operation:  8:30 am - 5:30 pm, M-F.  Also accepts Medicaid and self-pay.  °[facility] [place] [address], [place] Phone: (336) 878-6027   °[facility] [address], [place], [place] (336)723-1848, Ext. 123 Mondays & Thursdays: 7-9 AM.  First 15 patients are seen on a first come, first serve basis. °  ° °Medicaid-accepting [place] Providers: ° °Organization         Address  Phone   Notes  °[facility] [address], [place] (336) 641-2100 Also accepts self-pay patients.  °[facility] [address], [place] ° (336) 856-9996   °[facility] [address], [place]  (336) 288-8857   °[facility] [address] [place] [address], [place] (336) 299-7000   °Veita Bland [address], [place]  ° (336) 373-1557 Only accepts [place] Access Medicaid patients after they have their name applied to their card.  ° °Self-Pay (no insurance) in [place]: ° °Organization         Address  Phone   Notes  °Sickle Cell Patients, [facility] [address], [place] (336) 832-1970   °[facility] [address], [place] (336) 832-4400   ° [facility] ° [address], [place] (336) 992-4800   °[facility]/Dr. Osei-Bonsu ° [address] [place] [address], [place] or [address], [place] (336) 841-8500 Phone number for both [place] and [place] locations is the same.  °[facility] [address], [place] (336) 299-0000   °[facility] [address] [place] [address], [place] or [address] (336) 852-7530 °(336) 878-2260   °[facility] [address], [place] (336) 350-1642, phone; (336) 294-5005, fax Sees patients 1st and 3rd Saturday of every month.  Must not qualify for public or private insurance (i.e. Medicaid, Medicare, [place] Health Choice, Veterans' Benefits) • Household income should be no more than 200% of the poverty level •The clinic cannot treat you if you are pregnant or think you are pregnant • Sexually transmitted diseases are not treated at the clinic.  ° ° °Dental Care: °Organization         Address  Phone  Notes  °[place] [facility] [address], [place] (336) 641-6152 Accepts children up to age 21 who are enrolled in Medicaid or [place] Health Choice; pregnant women with a Medicaid card; and children who have applied for Medicaid or [place] Health Choice, but were declined, whose parents can pay a reduced fee at time of service.  °[place]  [facility] [place]  [address], [place] (336) 641-7733 Accepts children up to age 21 who are enrolled in Medicaid or [place] Health Choice; pregnant women with a Medicaid card; and children who have applied for Medicaid or  Health Choice, but were declined, whose parents can pay a reduced fee at time of service.  °[facility] ° [address], [place] (336) 641-4533 Patients are seen by appointment only. Walk-ins are not accepted. [facility] will see patients 18 years of age and older. °Monday - Tuesday (8am-5pm) °Most Wednesdays (8:30-5pm) °$30 per visit, cash only  °[facility] ° [address], [place] (336) 641-4533 Patients are seen by appointment only. Walk-ins are not accepted. [facility] will see patients 18 years of age and older. °One   Wednesday Evening (Monthly: Volunteer Based).  $30 per visit, cash only  °[facility]  (919) 537-3737 for adults; Children under age 4, call Graduate Pediatric Dentistry at (919) [phone]. Children aged 4-14, please call (919) [phone] to request a pediatric application. ° Dental services are provided in all areas of dental care including fillings, crowns and bridges, complete and partial dentures, implants, gum treatment, root canals, and extractions. Preventive care is also provided. Treatment is provided to both adults and children. °Patients are selected via a lottery and there is often a waiting list. °  °[facility] [address], °[place] ° (336) 763-8833 [email or web address] °  °[facility] [address], [place], [place] (336)723-1848, Ext. 123 Second and Fourth Thursday of each month, opens at 6:30 AM; Clinic ends at 9 AM.  Patients are seen on a first-come first-served basis, and a limited number are seen during each clinic.  ° °[facility] ° [address], [place], [place] (336) 723-7904    Eligibility Requirements °You must have lived in [place], or [place] counties for at least the last three months. °  You cannot be eligible for state or federal sponsored healthcare insurance, including Veterans Administration, Medicaid, or Medicare. °  You generally cannot be eligible for healthcare insurance through your employer.  °  How to apply: °Eligibility screenings are held every Tuesday and Wednesday afternoon from 1:00 pm until 4:00 pm. You do not need an appointment for the interview!  °[facility] [address], [place], [place] 336-631-2330   °[facility]  336-342-8273   °[place]  336-703-3100   °[facility]  336-570-6415   ° °Behavioral Health Resources in the Community: °Intensive Outpatient Programs °Organization         Address  Phone  Notes  °[facility] [address], [place], [place] 336-878-6098   °[facility] [address], [place], [place] 336-832-9800   °[facility] [address], [place], [place] ° 336-882-2125   °[facility] [address],  °[place], [place] 1-800-853-5163 or 336-641-4981   °Substance Abuse Resources °Organization         Address  Phone  Notes  °[facility]  336-882-2125   °[facility]  336-784-9470   °[facility]  336-285-9073   °[facility]  336-845-3988   °Residential & Outpatient Substance Abuse Program  1-800-659-3381   °Psychological Services °Organization         Address  Phone  Notes  °[facility]  336- 832-9600   °[facility]  336- 378-7881   °[facility] [address], [place] 1-800-853-5163 or 336-641-4981   ° °Mobile Crisis Teams °Organization         Address  Phone  Notes  °[facility], Mobile Crisis Care Unit  1-877-626-1772   °Assertive °[facility] ° [address]  [place], [place] 336-834-9664   °Sharon DeEsch [address] °[place] 336-554-5454   ° °Self-Help/Support Groups °Organization         Address  Phone             Notes  °[facility]  - variety of support groups  336- 373-1402 Call for more information  °Narcotics Anonymous (NA), [facility] [address], °[place]   2 meetings at this location  ° °  Residential Treatment Programs Organization         Address  Phone  Notes  [facility] [address],    [place]  1-443-792-0608   [facility]  [address] [place] [id number], [place], [place]   [place], [place] 620-350-2466 Admissions: 8am-3pm M-F  [facility]stance [address],    [place], [place] X4321937   [facility] [address] [place], [place], [place]   [facility] [address],  [place], [place]   [facility] Intensive Outpatient [address], [place], [place]   [facility] [address]  [place], [place] 1-8476776688 or 424-741-5739   [facility] ([facility] [address] [place], [place] Accepts Medicaid  [facility] [address],  [place] 1-3364028700 Substance Abuse/Addiction Treatment   [facility]         Address  Phone  Notes  [facility]  206-728-2745   Domenic Schwab, PhD [address] [place], [place]   757-514-7384 or 337-473-6177   [place], [place] 513-115-1862   [facility] [address] [place], [place] 214-574-8480 Insurance/Medicaid/sponsorship through [facility] [address]                                    [place], [place] [phone] [place] [address], [place] [phone]    Dr. Adele Schilder  [phone]   [facility] Dept. 1) [address], [place] 2) [place] 3)  [address], [place] [phone]  (781)[phone]   [place] [phone] or [phone] (After Hours)       Take the prescription as directed: do not take extra tylenol when you take this prescription medication because it already contains tylenol. Wear the sling as needed for comfort for the next few days. Apply moist heat or ice to the area(s) of discomfort, for 15 minutes at a time, several times per day for the next few days.  Do not fall asleep on a heating or ice pack.  Call your regular medical doctor tomorrow morning to schedule a follow up appointment in the next 3 days. Call the Orthopedic doctor tomorrow morning to schedule a follow up appointment this week.  Return to the Emergency Department immediately if worsening.

## 2014-02-21 NOTE — Progress Notes (Signed)
VASCULAR LAB PRELIMINARY  PRELIMINARY  PRELIMINARY  PRELIMINARY  Left upper extremity venous Doppler completed.    Preliminary report:  There is no DVT or SVT noted in the left upper extremity.  Yamilette Garretson, RVT 02/21/2014, 10:10 AM

## 2014-02-22 ENCOUNTER — Telehealth: Payer: Self-pay | Admitting: *Deleted

## 2014-02-22 ENCOUNTER — Ambulatory Visit: Payer: Medicare Other | Admitting: Family Medicine

## 2014-02-22 LAB — URIC ACID, SYNOVIAL FLUID: Uric Acid, Synovial Fluid: 9 mg/dL — ABNORMAL HIGH (ref 0.0–6.0)

## 2014-02-22 LAB — PROTEIN, SYNOVIAL FLUID: Protein, Synovial Fluid: 2.3 g/dL (ref 1.0–3.0)

## 2014-02-22 NOTE — Telephone Encounter (Signed)
Recently went to ER - needs ER FU appt - has appt with Dr Marinda Elk 03/03/14 8:15AM. Offered to bring in with another doctor - pt insist only sees Dr Marinda Elk. Instructed pt to call clinic if any change. Hilda Blades Keenen Roessner RN 02/22/14 12:15PM

## 2014-02-24 LAB — BODY FLUID CULTURE
Culture: NO GROWTH
Gram Stain: NONE SEEN

## 2014-02-25 ENCOUNTER — Telehealth: Payer: Self-pay | Admitting: *Deleted

## 2014-02-25 NOTE — Telephone Encounter (Signed)
Pt called saying right knee is filling up with fluid and getting painful, she has an appt with you tomorrow but didn't know if you wanted to call her in any pain meds today.  Let me know how you want to handle

## 2014-02-26 ENCOUNTER — Ambulatory Visit (INDEPENDENT_AMBULATORY_CARE_PROVIDER_SITE_OTHER): Payer: Medicare Other | Admitting: Family Medicine

## 2014-02-26 ENCOUNTER — Encounter: Payer: Self-pay | Admitting: Family Medicine

## 2014-02-26 VITALS — BP 157/85 | Ht 66.0 in | Wt 169.0 lb

## 2014-02-26 DIAGNOSIS — M25561 Pain in right knee: Secondary | ICD-10-CM | POA: Diagnosis not present

## 2014-02-26 NOTE — Assessment & Plan Note (Signed)
-  Asymptomatic today, improved on Tylenol, ibuprofen, and tramadol at night. -The patient did not request any refill of tramadol today. -I cautioned her about the side effects and risk of dependency even with tramadol, and that she should first try Tylenol or ibuprofen if needed for pain. -She has not had any recent right knee x-rays, though because she is overall asymptomatic today we will hold off on obtaining new x-rays. -I also explained corticosteroid injection for the right knee, but she did not seem enthusiastic about trying this, and this would not be an option today since she is overall asymptomatic. -I agreed that I would provide one refill of tramadol at the time that she is running out of her current prescription, but if she is going to forego corticosteroid injection and require long-term tramadol, that this would likely be best managed by her primary physician. -I will see her back on an as-needed basis. -An aside, she is doing significant better from the standpoint of her bilateral ankle pain after sport insoles.

## 2014-02-26 NOTE — Progress Notes (Signed)
   Subjective:    Patient ID: [name], female    DOB: 14-Sep-1934, 80 y.o.   MRN: [id number]  HPI [name] is a 80 year old pleasant female who presents for questions about tramadol use and a recent flare of right knee pain, which is currently improved today. She has had chronic bilateral ankle, bilateral knee, and bilateral CMC joint pain related to arthritis. She denies any acute injury that triggered her right knee pain, but does say her water aerobics class had been doing impact resistance jumping from the bottom of the pool. Her symptoms are aggravated with activities such as walking. She denies any pain that wakes her up at night. She is using topical Aspercreme with some relief. She notices intermittent bilateral ankle and knee swelling. She denies any rash, warmth, fever, chills, numbness, or tingling. She has a history of bilateral bunion surgery. She found significant relief by taking one or 2 tramadol at night. She wonders if it is okay to follow this pain regimen. She denies any history of substance abuse or dependency.  Past medical history, social history, medications, and allergies were reviewed and are up to date in the chart.  Review of Systems 7 point review of systems was performed and was otherwise negative unless noted in the history of present illness.     Objective:   Physical Exam BP 157/85 mmHg  Ht 5\' 6"  (1.676 m)  Wt 169 lb (76.658 kg)  BMI 27.29 kg/m2 GEN: The patient is well-developed well-nourished female and in no acute distress.  She is awake alert and oriented x3. SKIN: warm and well-perfused, no rash  EXTR: No lower extremity edema or calf tenderness Neuro: Strength 5/5 globally. Sensation intact throughout. No focal deficits. Vasc: +2 bilateral distal pulses. No edema.  MSK: Examination of the right knee reveals no effusion, warmth, or erythema. Range of motion is from 0-120 with mild crepitus without pain. No valgus or varus instability. Mild medial  joint line tenderness to palpation. Patellar and quadriceps tendons intact.  X-rays Right Knee in 2004: Mild degenerative changes.    Assessment & Plan:  Please see problem based assessment and plan in the problem list.

## 2014-02-26 NOTE — Telephone Encounter (Signed)
Rhea I need to see her first I have never seen her, Dr. Micheline Chapman has [facility]! Dorcas Mcmurray

## 2014-02-28 NOTE — Progress Notes (Signed)
[facility]: Attending Note: I have reviewed the chart, discussed wit the Sports Medicine Fellow. I agree with assessment and treatment plan as detailed in the Fellow's note.  

## 2014-03-01 DIAGNOSIS — M25522 Pain in left elbow: Secondary | ICD-10-CM | POA: Diagnosis not present

## 2014-03-02 ENCOUNTER — Other Ambulatory Visit: Payer: Self-pay | Admitting: *Deleted

## 2014-03-02 ENCOUNTER — Other Ambulatory Visit: Payer: Self-pay | Admitting: Internal Medicine

## 2014-03-02 DIAGNOSIS — M79602 Pain in left arm: Secondary | ICD-10-CM

## 2014-03-03 ENCOUNTER — Ambulatory Visit (INDEPENDENT_AMBULATORY_CARE_PROVIDER_SITE_OTHER): Payer: Medicare Other | Admitting: Internal Medicine

## 2014-03-03 ENCOUNTER — Encounter: Payer: Self-pay | Admitting: Internal Medicine

## 2014-03-03 ENCOUNTER — Observation Stay (HOSPITAL_COMMUNITY)
Admission: AD | Admit: 2014-03-03 | Discharge: 2014-03-05 | Disposition: A | Discharge status: Home / Self Care | Payer: Medicare Other | Source: Ambulatory Visit | Attending: Infectious Disease | Admitting: Infectious Disease

## 2014-03-03 ENCOUNTER — Encounter (HOSPITAL_COMMUNITY): Payer: Self-pay | Admitting: General Practice

## 2014-03-03 VITALS — BP 152/75 | HR 60 | Temp 97.6°F | Ht 66.0 in | Wt 161.5 lb

## 2014-03-03 DIAGNOSIS — N184 Chronic kidney disease, stage 4 (severe): Secondary | ICD-10-CM | POA: Diagnosis present

## 2014-03-03 DIAGNOSIS — I10 Essential (primary) hypertension: Secondary | ICD-10-CM | POA: Diagnosis present

## 2014-03-03 DIAGNOSIS — Z87891 Personal history of nicotine dependence: Secondary | ICD-10-CM | POA: Insufficient documentation

## 2014-03-03 DIAGNOSIS — D509 Iron deficiency anemia, unspecified: Secondary | ICD-10-CM | POA: Insufficient documentation

## 2014-03-03 DIAGNOSIS — I129 Hypertensive chronic kidney disease with stage 1 through stage 4 chronic kidney disease, or unspecified chronic kidney disease: Secondary | ICD-10-CM | POA: Insufficient documentation

## 2014-03-03 DIAGNOSIS — M25522 Pain in left elbow: Secondary | ICD-10-CM | POA: Diagnosis present

## 2014-03-03 DIAGNOSIS — Z886 Allergy status to analgesic agent status: Secondary | ICD-10-CM | POA: Diagnosis not present

## 2014-03-03 DIAGNOSIS — M10022 Idiopathic gout, left elbow: Secondary | ICD-10-CM | POA: Diagnosis not present

## 2014-03-03 DIAGNOSIS — Z9851 Tubal ligation status: Secondary | ICD-10-CM | POA: Diagnosis not present

## 2014-03-03 DIAGNOSIS — M180 Bilateral primary osteoarthritis of first carpometacarpal joints: Secondary | ICD-10-CM | POA: Insufficient documentation

## 2014-03-03 DIAGNOSIS — Z90710 Acquired absence of both cervix and uterus: Secondary | ICD-10-CM | POA: Diagnosis not present

## 2014-03-03 DIAGNOSIS — Z9842 Cataract extraction status, left eye: Secondary | ICD-10-CM | POA: Insufficient documentation

## 2014-03-03 DIAGNOSIS — M109 Gout, unspecified: Secondary | ICD-10-CM | POA: Diagnosis present

## 2014-03-03 DIAGNOSIS — Z9841 Cataract extraction status, right eye: Secondary | ICD-10-CM | POA: Insufficient documentation

## 2014-03-03 DIAGNOSIS — M79602 Pain in left arm: Secondary | ICD-10-CM | POA: Diagnosis not present

## 2014-03-03 DIAGNOSIS — R52 Pain, unspecified: Secondary | ICD-10-CM

## 2014-03-03 DIAGNOSIS — F329 Major depressive disorder, single episode, unspecified: Secondary | ICD-10-CM | POA: Insufficient documentation

## 2014-03-03 DIAGNOSIS — K219 Gastro-esophageal reflux disease without esophagitis: Secondary | ICD-10-CM | POA: Insufficient documentation

## 2014-03-03 DIAGNOSIS — Z8673 Personal history of transient ischemic attack (TIA), and cerebral infarction without residual deficits: Secondary | ICD-10-CM | POA: Insufficient documentation

## 2014-03-03 DIAGNOSIS — M25429 Effusion, unspecified elbow: Secondary | ICD-10-CM | POA: Insufficient documentation

## 2014-03-03 DIAGNOSIS — T82590A Other mechanical complication of surgically created arteriovenous fistula, initial encounter: Secondary | ICD-10-CM | POA: Diagnosis present

## 2014-03-03 DIAGNOSIS — M25579 Pain in unspecified ankle and joints of unspecified foot: Secondary | ICD-10-CM | POA: Diagnosis not present

## 2014-03-03 HISTORY — DX: Chronic kidney disease, stage 4 (severe): N18.4

## 2014-03-03 HISTORY — DX: Cerebral infarction, unspecified: I63.9

## 2014-03-03 HISTORY — DX: Gout, unspecified: M10.9

## 2014-03-03 HISTORY — DX: Personal history of other medical treatment: Z92.89

## 2014-03-03 LAB — CBC WITH DIFFERENTIAL/PLATELET
Basophils Absolute: 0 10*3/uL (ref 0.0–0.1)
Basophils Relative: 0 % (ref 0–1)
Eosinophils Absolute: 0.2 10*3/uL (ref 0.0–0.7)
Eosinophils Relative: 3 % (ref 0–5)
HCT: 33.8 % — ABNORMAL LOW (ref 36.0–46.0)
Hemoglobin: 10.9 g/dL — ABNORMAL LOW (ref 12.0–15.0)
Lymphocytes Relative: 26 % (ref 12–46)
Lymphs Abs: 1.7 10*3/uL (ref 0.7–4.0)
MCH: 29.7 pg (ref 26.0–34.0)
MCHC: 32.2 g/dL (ref 30.0–36.0)
MCV: 92.1 fL (ref 78.0–100.0)
Monocytes Absolute: 0.5 10*3/uL (ref 0.1–1.0)
Monocytes Relative: 8 % (ref 3–12)
Neutro Abs: 4.3 10*3/uL (ref 1.7–7.7)
Neutrophils Relative %: 63 % (ref 43–77)
Platelets: 278 10*3/uL (ref 150–400)
RBC: 3.67 MIL/uL — ABNORMAL LOW (ref 3.87–5.11)
RDW: 13.5 % (ref 11.5–15.5)
WBC: 6.7 10*3/uL (ref 4.0–10.5)

## 2014-03-03 LAB — BASIC METABOLIC PANEL
Anion gap: 9 (ref 5–15)
BUN: 31 mg/dL — ABNORMAL HIGH (ref 6–23)
CO2: 27 mmol/L (ref 19–32)
Calcium: 8.9 mg/dL (ref 8.4–10.5)
Chloride: 101 mmol/L (ref 96–112)
Creatinine, Ser: 2.17 mg/dL — ABNORMAL HIGH (ref 0.50–1.10)
GFR calc Af Amer: 24 mL/min — ABNORMAL LOW (ref >90)
GFR calc non Af Amer: 21 mL/min — ABNORMAL LOW (ref >90)
Glucose, Bld: 102 mg/dL — ABNORMAL HIGH (ref 70–99)
Potassium: 3.9 mmol/L (ref 3.5–5.1)
Sodium: 137 mmol/L (ref 135–145)

## 2014-03-03 MED ORDER — COLCHICINE 0.6 MG PO TABS: 0.6000 mg | ORAL_TABLET | Freq: Two times a day (BID) | ORAL | Status: DC

## 2014-03-03 MED ORDER — CENTRUM SILVER ADULT 50+ PO TABS: 1.0000 | ORAL_TABLET | Freq: Every day | ORAL | Status: DC

## 2014-03-03 MED ORDER — FUROSEMIDE 40 MG PO TABS
40.0000 mg | ORAL_TABLET | Freq: Every day | ORAL | Status: DC
  Administered 2014-03-04 – 2014-03-05 (×2): 40 mg via ORAL
  Filled 2014-03-03 (×2): qty 1

## 2014-03-03 MED ORDER — CINACALCET HCL 30 MG PO TABS
30.0000 mg | ORAL_TABLET | Freq: Two times a day (BID) | ORAL | Status: DC
  Administered 2014-03-03 – 2014-03-04 (×2): 30 mg via ORAL
  Filled 2014-03-03 (×4): qty 1

## 2014-03-03 MED ORDER — PANTOPRAZOLE SODIUM 40 MG PO TBEC
40.0000 mg | DELAYED_RELEASE_TABLET | Freq: Every day | ORAL | Status: DC
  Administered 2014-03-04: 40 mg via ORAL
  Filled 2014-03-03: qty 1

## 2014-03-03 MED ORDER — OXYCODONE-ACETAMINOPHEN 5-325 MG PO TABS
1.0000 | ORAL_TABLET | Freq: Four times a day (QID) | ORAL | Status: DC | PRN
  Administered 2014-03-03 – 2014-03-05 (×3): 1 via ORAL
  Filled 2014-03-03 (×3): qty 1

## 2014-03-03 MED ORDER — HEPARIN SODIUM (PORCINE) 5000 UNIT/ML IJ SOLN
5000.0000 [IU] | Freq: Three times a day (TID) | INTRAMUSCULAR | Status: DC
  Administered 2014-03-03 – 2014-03-04 (×3): 5000 [IU] via SUBCUTANEOUS
  Filled 2014-03-03 (×3): qty 1

## 2014-03-03 MED ORDER — BUPROPION HCL ER (XL) 150 MG PO TB24
150.0000 mg | ORAL_TABLET | Freq: Every day | ORAL | Status: DC
  Administered 2014-03-04 – 2014-03-05 (×2): 150 mg via ORAL
  Filled 2014-03-03 (×2): qty 1

## 2014-03-03 MED ORDER — ADULT MULTIVITAMIN W/MINERALS CH
1.0000 | ORAL_TABLET | Freq: Every day | ORAL | Status: DC
  Administered 2014-03-04 – 2014-03-05 (×2): 1 via ORAL
  Filled 2014-03-03 (×2): qty 1

## 2014-03-03 MED ORDER — AMLODIPINE BESYLATE 10 MG PO TABS
10.0000 mg | ORAL_TABLET | Freq: Every day | ORAL | Status: DC
  Administered 2014-03-03 – 2014-03-04 (×2): 10 mg via ORAL
  Filled 2014-03-03 (×2): qty 1

## 2014-03-03 MED ORDER — ATENOLOL 25 MG PO TABS
25.0000 mg | ORAL_TABLET | Freq: Every day | ORAL | Status: DC
  Administered 2014-03-03 – 2014-03-04 (×2): 25 mg via ORAL
  Filled 2014-03-03 (×2): qty 1

## 2014-03-03 MED ORDER — COLCHICINE 0.6 MG PO TABS
1.2000 mg | ORAL_TABLET | Freq: Once | ORAL | Status: AC
  Administered 2014-03-03: 1.2 mg via ORAL
  Filled 2014-03-03: qty 2

## 2014-03-03 NOTE — Assessment & Plan Note (Signed)
Assessment: Patient presents with severe pain in her left proximal forearm anteriorly, left antecubital area, and left elbow which started on 02/20/2014.  She denies any injury to that area and any prior history of similar pain.  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  Given her tenderness in the area of the AV graft, I am concerned about a complication of her AV graft (vascular or infectious.)  It is unclear whether the AV graft was chronically thrombosed; notes from the inpatient admission earlier this month described absent thrill.  Plan: Admit for observation and diagnostic evaluation including vascular surgery consult and imaging based on their evaluation, which might include arterial and venous Dopplers, ultrasound imaging, or possibly MRI.

## 2014-03-03 NOTE — Assessment & Plan Note (Signed)
  Lab Results  Component Value Date   CREATININE 2.51* 02/21/2014   CREATININE 2.06* 02/10/2014   CREATININE 2.35* 02/09/2014   CREATININE 2.02* 11/03/2013   CREATININE 2.60* 06/02/2013      Assessment: Patient was previously dialysis dependent, but has been off of hemodialysis since 2010.  Plan:  I called her nephrologist Dr. Corliss Parish and discussed her presenting complaints and my plans for admission and diagnostic workup.

## 2014-03-03 NOTE — Assessment & Plan Note (Signed)
BP Readings from Last 3 Encounters:  03/03/14 152/75  02/26/14 157/85  02/21/14 175/89    Lab Results  Component Value Date   NA 137 02/21/2014   K 3.9 02/21/2014   CREATININE 2.51* 02/21/2014    Assessment: Blood pressure control: mildly elevated Comments: Blood pressure is mildly elevated, likely aggravated by her pain.  Plan: Medications:  continue current medications, and reassess blood pressure control after pain is controlled.

## 2014-03-03 NOTE — Progress Notes (Signed)
Admitted to room 6n23, A/Ox4, c/o left arm pain 9/10, denies nausea, oriented to room and surroundings, doctors notified of arrival

## 2014-03-03 NOTE — H&P (Signed)
Date: 03/03/2014               Patient Name:  [name] MRN: [id number]  DOB: 02-17-1934 Age / Sex: 80 y.o., female   PCP: Nichole Filler, MD         Medical Service: Internal Medicine Teaching Service         Attending Physician: Dr. Truman Hayward, MD    First Contact: Dr. Julious Cole Pager: [phone]  Second Contact: Dr. Joni Cole Pager: [phone]       After Hours (After 5p/  First Contact Pager: [phone]  weekends / holidays): Second Contact Pager: [phone]   Chief Complaint: left elbow pain and lt antecubital fossa pain  History of Present Illness: Pt is a 80 y/o female with PMHx of HTN, arthritis, and CKD4- no longer on HD who presents with left elbow and antecubital fossa pain x 2 weeks. Pt was seen in the ED on 02/21/14 for left elbow pain. Lt elbow xray revealed an effusion, neg for fractures. Arthrocentesis was done at that time and synovial fluid was positive for 1342 WBCs and neg for crystals consistent with noninflammatory arthritis .LUE doppler was negative for DVT or SVT. Pt was discharged home with pain meds and f/u with Dr. Erlinda Cole w/ ortho the following week. She follows w/ [facility] and last saw them on 2/26 for right knee pain and pt was continued on tramadol for pain with option of steroid injections in the future. She was then seen in clinic today with complaint of left antecubital fossa pain and left elbow pain that initially began on 02/20/14, one day prior to ED visit, and pain was relieved with joint aspiration and tramadol. Then 2 weeks ago patient started experiencing pain in her left antecubital fossa and she noted that tramadol was no longer controlling her left elbow pain. She states after left elbow aspiration she had swelling of left antecubital fossa that has since gone down. She is complaining of severe lt antecubital fossa pain and cannot fully extended or flex at elbow due to pain. Denies hx of pain similar to this before. Pt was then  directly admitted to IMTS due to concern for vascular or infectious AV graft complications. Pt denies night sweats, fevers, chills, cervical lymphadenopathy, and n/v.    Meds: No current facility-administered medications for this encounter.    Allergies: Allergies as of 03/03/2014 - Review Complete 03/03/2014  Allergen Reaction Noted  . Ibuprofen Other (See Comments) 01/17/2014   Past Medical History  Diagnosis Date  . Depression   . Hypertension   . Gastritis, Helicobacter pylori     Noted on EGD 01/18/2009 by Dr. Laurence Cole;  biopsy showed chronic gastritis with Helicobacter pylori, treated by Dr. Oletta Cole.  . Pericardial effusion 11/2004    S/P subxiphoid pericardial window 11/01/2004 by [name].  A question of possible collagen vascular disease had been raised in 2006 due to arthralgias, a history of idiopathic pericarditis, and increased pigmentation of her palms, and she was referred to Dr. Rockwell Cole but he was unable to confirm a rheumatologic diagnosis.  [name] Hemorrhoids, internal 05/09/2005     Found on colonoscopy 05/09/2005 by Dr. Laurence Cole  . Cataracts, bilateral   . Pinguecula   . Vitreous detachment     "no OR"  . Thigh pain   . Anemia, iron deficiency   . Angiodysplasia of colon 05/09/2005    Single small angiodysplastic lesion in the descending  colon found on colonoscopy 05/09/2005 by Dr. Laurence Cole  . Anemia due to blood loss, chronic   . Stiffness of joint, not elsewhere classified, hand   . Osteoporosis, unspecified 03/06/2012    A DXA bone density scan done 02/20/2012 showed osteoporosis with a lumbar spine T-score of -4.4 and a right femur T-score of -2.8.  Patient has chronic kidney disease with estimated GFR less than 30, and it is not clear how much of her osteoporosis may be due to renal osteodystrophy.    [name] GERD (gastroesophageal reflux disease)   . Chronic knee pain     bilateral  . Chronic ankle pain     bilateral  . Chronic thumb pain     . Renal failure     Formerly on hemodialysis; managed by [name]  . Chronic kidney disease, stage IV (severe)     [name] 02/09/2014  . History of blood transfusion     "low blood count"  . Gout   . Epicondylitis   . Arthritis     "knots on my thumbs; left elbow" (03/03/2014)  . Stroke 2007    "when my kidneys went out & I was in a coma; weak LUE/LLE since"   Past Surgical History  Procedure Laterality Date  . Subxyphoid pericardial window  11/01/2004  . Arteriovenous graft placement Left 11/28/2005    forearm arteriovenous  . Thrombectomy / arteriovenous graft revision Left 01/21/2006    Thrombectomy and revision of left forearm loop AV graft.  . Thrombectomy Left  03/06/2006    Simple thrombectomy arteriovenous Gore-Tex graft, left arm, with exploration of venous end and intraoperative shuntogram.         . Hallux valgus correction Left 01/2012    foot  . Abdominal hysterectomy  1960's  . Tubal ligation  1960's    "before hysterectomy"  . Cardiac catheterization  01/2005    [name] 05/15/2010   Family History  Problem Relation Age of Onset  . Hypertension Brother   . Heart disease Father   . Diabetes Brother   . Breast cancer Neg Hx   . Colon cancer Neg Hx   . Lung cancer Neg Hx   . Cervical cancer Neg Hx   . Sickle cell anemia Neg Hx    History   Social History  . Marital Status: Legally Separated    Spouse Name: N/A  . Number of Children: N/A  . Years of Education: N/A   Occupational History  . Not on file.   Social History Main Topics  . Smoking status: Former Smoker -- 0.10 packs/day for 1 years    Types: Cigarettes    Quit date: 01/02/1960  . Smokeless tobacco: Never Used  . Alcohol Use: No  . Drug Use: No  . Sexual Activity: No   Other Topics Concern  . Not on file   Social History Narrative    Review of Systems: Pertinent items are noted in HPI.  Physical Exam: Blood pressure 154/78, pulse 71, temperature 98.2 F (36.8 C),  temperature source Oral, resp. rate 16, height 5\' 6"  (1.676 m), weight 169 lb (76.658 kg), SpO2 99 %. General: NAD, sitting in recliner at bedside HEENT: moist mucous membranes Lungs: CTAB, no wheezing Cardiac: RRR, no murmurs GI: soft, active bowel sounds, not TTP  Neuro: 3/5 on left hand grip, good left hand abductor strength Ext: tender to palpation over left elbow joint, could not appreciate erythema, positive for warmth. Left antecubital fossa was tender to  palpation. AV graft with good bruit, absent thrill.  Assessment & Plan by Problem: Principal Problem:   Pain and swelling of left elbow Active Problems:   Essential hypertension   CKD (chronic kidney disease) stage 4, GFR 15-29 ml/min   Left antecubital fossa and elbow pain-- likely 2/2 elevated uric acid of 9.0 that was noted in synovial fluid from left elbow joint aspiration on 2/21. Spoke with Dr. Sharol Given and he recommends tx for gout with colchine 0.6mg  BID and can try prednisone if infectious etiology rule out. Can also start doxycycline if infection likely. States pt can f/u tomorrow w/ortho clinic. DDx included AV graft thombosis however audible bruit was heard on graft auscultation, AV graft infection or aneurysm, bursitis, and septic joint- although less likely given length of time.  - admit to med surg - VVS consult tomorrow - CBC - percocet q6h prn for pain  HTN - continue home norvasc 10mg , atenolol 25mg , and lasix 40mg  BID  CKD4--b/l around 2.25. She follows w/ Dr. Moshe Cipro.  - continue to monitor  GERD-- on prilosec 20mg  at home - protoninx 40mg  daily  Diet-- renal  DVT ppx-- hep [place]  Dispo: Disposition is deferred at this time, awaiting improvement of current medical problems. Anticipated discharge in approximately 1-2 day(s).   The patient does have a current PCP Nichole Filler, MD) and does need an [facility] hospital follow-up appointment after discharge.  The patient does not have transportation  limitations that hinder transportation to clinic appointments.  Signed: Julious Oka, MD 03/03/2014, 4:25 PM

## 2014-03-03 NOTE — Progress Notes (Signed)
   Subjective:    Patient ID: [name], female    DOB: Jan 09, 1934, 80 y.o.   MRN: [id number]  HPI Patient presents with a chief complaint of pain in her left antecubital area, left proximal forearm, and left elbow which started on the evening of 02/20/14.  She was seen in the [facility] ED on 02/21/2014 and underwent left elbow arthrocentesis with findings consistent with noninflammatory arthritis.  She describes severe pain that has persisted without improvement.  The pain is mainly in the anterior proximal forearm and antecubital area in the area of her AV graft; she has also noticed distention and pulsation of the artery proximal to the graft.   Review of Systems  Constitutional: Negative for fever, chills and diaphoresis (Patient reports diaphoresis on 2/21 which then resolved.).  HENT: Negative for dental problem, ear pain and sore throat.   Respiratory: Negative for cough, shortness of breath and wheezing.   Cardiovascular: Negative for chest pain and leg swelling.  Gastrointestinal: Negative for nausea, vomiting, abdominal pain and blood in stool.  Genitourinary: Negative for dysuria, frequency and flank pain.  Musculoskeletal: Positive for arthralgias (Left elbow only).  Neurological: Negative for dizziness, seizures, syncope, weakness and numbness.       Objective:   Physical Exam  Cardiovascular: Normal rate, regular rhythm and normal heart sounds.  Exam reveals no gallop and no friction rub.   No murmur heard. Pulmonary/Chest: Effort normal and breath sounds normal. No respiratory distress. She has no wheezes. She has no rales.  Abdominal: Soft. Bowel sounds are normal. She exhibits no distension. There is no tenderness. There is no rebound and no guarding.  Musculoskeletal:       Arms:        Assessment & Plan:

## 2014-03-04 ENCOUNTER — Observation Stay (HOSPITAL_COMMUNITY): Payer: Medicare Other

## 2014-03-04 DIAGNOSIS — Z9851 Tubal ligation status: Secondary | ICD-10-CM | POA: Diagnosis not present

## 2014-03-04 DIAGNOSIS — N184 Chronic kidney disease, stage 4 (severe): Secondary | ICD-10-CM

## 2014-03-04 DIAGNOSIS — T82590A Other mechanical complication of surgically created arteriovenous fistula, initial encounter: Secondary | ICD-10-CM | POA: Diagnosis present

## 2014-03-04 DIAGNOSIS — D509 Iron deficiency anemia, unspecified: Secondary | ICD-10-CM | POA: Diagnosis not present

## 2014-03-04 DIAGNOSIS — I129 Hypertensive chronic kidney disease with stage 1 through stage 4 chronic kidney disease, or unspecified chronic kidney disease: Secondary | ICD-10-CM

## 2014-03-04 DIAGNOSIS — M25522 Pain in left elbow: Secondary | ICD-10-CM

## 2014-03-04 DIAGNOSIS — R52 Pain, unspecified: Secondary | ICD-10-CM | POA: Insufficient documentation

## 2014-03-04 DIAGNOSIS — Z9841 Cataract extraction status, right eye: Secondary | ICD-10-CM | POA: Diagnosis not present

## 2014-03-04 DIAGNOSIS — M25422 Effusion, left elbow: Secondary | ICD-10-CM

## 2014-03-04 DIAGNOSIS — T82898A Other specified complication of vascular prosthetic devices, implants and grafts, initial encounter: Secondary | ICD-10-CM | POA: Diagnosis not present

## 2014-03-04 DIAGNOSIS — Z9842 Cataract extraction status, left eye: Secondary | ICD-10-CM | POA: Diagnosis not present

## 2014-03-04 DIAGNOSIS — Z87891 Personal history of nicotine dependence: Secondary | ICD-10-CM | POA: Diagnosis not present

## 2014-03-04 DIAGNOSIS — M10022 Idiopathic gout, left elbow: Secondary | ICD-10-CM | POA: Diagnosis not present

## 2014-03-04 DIAGNOSIS — F329 Major depressive disorder, single episode, unspecified: Secondary | ICD-10-CM | POA: Diagnosis not present

## 2014-03-04 DIAGNOSIS — M25429 Effusion, unspecified elbow: Secondary | ICD-10-CM | POA: Insufficient documentation

## 2014-03-04 DIAGNOSIS — M180 Bilateral primary osteoarthritis of first carpometacarpal joints: Secondary | ICD-10-CM | POA: Diagnosis not present

## 2014-03-04 DIAGNOSIS — Z886 Allergy status to analgesic agent status: Secondary | ICD-10-CM | POA: Diagnosis not present

## 2014-03-04 DIAGNOSIS — Z90710 Acquired absence of both cervix and uterus: Secondary | ICD-10-CM | POA: Diagnosis not present

## 2014-03-04 DIAGNOSIS — K219 Gastro-esophageal reflux disease without esophagitis: Secondary | ICD-10-CM | POA: Diagnosis not present

## 2014-03-04 DIAGNOSIS — Z8673 Personal history of transient ischemic attack (TIA), and cerebral infarction without residual deficits: Secondary | ICD-10-CM | POA: Diagnosis not present

## 2014-03-04 LAB — SYNOVIAL CELL COUNT + DIFF, W/ CRYSTALS
Eosinophils-Synovial: 0 % (ref 0–1)
Lymphocytes-Synovial Fld: 1 % (ref 0–20)
Monocyte-Macrophage-Synovial Fluid: 2 % — ABNORMAL LOW (ref 50–90)
Neutrophil, Synovial: 97 % — ABNORMAL HIGH (ref 0–25)
Other Cells-SYN: 0
WBC, Synovial: 14339 /mm3 — ABNORMAL HIGH (ref 0–200)

## 2014-03-04 MED ORDER — COLCHICINE 0.6 MG PO TABS
0.3000 mg | ORAL_TABLET | Freq: Every day | ORAL | Status: DC
  Administered 2014-03-04: 0.3 mg via ORAL
  Filled 2014-03-04: qty 1

## 2014-03-04 NOTE — H&P (Signed)
  Date: 03/04/2014  Patient name: [name]  Medical record number: [id number]  Date of birth: Jul 05, 1934   I have seen and evaluated [name] and discussed their care with the Residency Team including Dr. Hulen Luster and Dr. Heber Pierce  Assessment and Plan: I have seen and evaluated the patient as outlined above. I agree with the formulated Assessment and Plan as detailed in the residents' admission note, with the following changes:   Briefly this is 80 year old lady who had acute onset of left elbow pain and a and effusion that was noted in the emerge department on 02/21/2014. Aspirate of the joint yielded 1342 white blood cells with 68% neutrophils 20% monocytes are seen uric acid in the fluid was 9 protein 2.3.  She had initial improvement in her symptoms after aspiration of this area and after initiation of Ultram for pain control. However pain recurred and she was in exquisite pain in the clinic yesterday. Her effusion seemed worse and Dr. Marinda Elk who was her clinic doctor was concerned about the possibility that her graft which is anterior to her effusion might be itself infected.  Today on exam she still has her quite painful warm tender effusion about her elbow. She is also extrinsic tender in the antecubital fossa in the area of her graft.  Greatly appreciate vascular surgery's help as well as orthopedic surgeries advice.  We will plan on an IR guided aspirate of the joint to send and the fluid for  Cell count and differential  Crystal analysis  Gram stain and culture  Fungal stain and culture  AFB stain and culture if there is  enough fluid.  VVS is getting [place]  I think we may also want to get MRI of this elbow to better evaluate the deeper tissues including bone.  We had already started her on colchicine yesterday.  Truman Hayward, [place] 3/3/20162:02 PM

## 2014-03-04 NOTE — Consult Note (Signed)
[facility] [place]  Patient name: [name] MRN: [id number] DOB: January 23, 1934 Sex: female  REASON FOR CONSULT: Possible infected left forearm AV graft  HPI: [name] is a 79 y.o. female who was admitted yesterday with left elbow pain and pain in the antecubital fossa. The patient tells me that she had a left forearm graft placed in 2009. This was only used for approximately a month because it occluded multiple times. She states that she underwent several surgical revisions but that the graft did not stay open. Her kidney function improved and she has not required dialysis for many years. Graft has not been used really since 2009. She developed pain in her left elbow and swelling. She apparently has undergone an aspirate of the left elbow sign of fluid which had elevated uric acid. She is being treated for gout. Vascular surgery was consult to rule out a vascular graft infection. The patient is unaware of any history of fever or chills. He denies any recent uremic symptoms including nausea, vomiting, fatigue, anorexia, or palpitations.  Past Medical History  Diagnosis Date  . Depression   . Hypertension   . Gastritis, Helicobacter pylori     Noted on EGD 01/18/2009 by [name];  biopsy showed chronic gastritis with Helicobacter pylori, treated by [name].  . Pericardial effusion 11/2004    S/P subxiphoid pericardial window 11/01/2004 by [name].  A question of possible collagen vascular disease had been raised in 2006 due to arthralgias, a history of idiopathic pericarditis, and increased pigmentation of her palms, and she was referred to [name] but he was unable to confirm a rheumatologic diagnosis.  [name] Hemorrhoids, internal 05/09/2005     Found on colonoscopy 05/09/2005 by [name]  . Cataracts, bilateral   . Pinguecula   . Vitreous detachment     "no OR"  . Thigh pain   . Anemia, iron deficiency   . Angiodysplasia  of colon 05/09/2005    Single small angiodysplastic lesion in the descending colon found on colonoscopy 05/09/2005 by [name]  . Anemia due to blood loss, chronic   . Stiffness of joint, not elsewhere classified, hand   . Osteoporosis, unspecified 03/06/2012    A DXA bone density scan done 02/20/2012 showed osteoporosis with a lumbar spine T-score of -4.4 and a right femur T-score of -2.8.  Patient has chronic kidney disease with estimated GFR less than 30, and it is not clear how much of her osteoporosis may be due to renal osteodystrophy.    [name] GERD (gastroesophageal reflux disease)   . Chronic knee pain     bilateral  . Chronic ankle pain     bilateral  . Chronic thumb pain   . Renal failure     Formerly on hemodialysis; managed by Dr. Corliss Parish  . Chronic kidney disease, stage IV (severe)     [name] 02/09/2014  . History of blood transfusion     "low blood count"  . Gout   . Epicondylitis   . Arthritis     "knots on my thumbs; left elbow" (03/03/2014)  . Stroke 2007    "when my kidneys went out & I was in a coma; weak LUE/LLE since"    Family History  Problem Relation Age of Onset  . Hypertension Brother   . Heart disease Father   . Diabetes Brother   . Breast cancer Neg Hx   . Colon cancer Neg  Hx   . Lung cancer Neg Hx   . Cervical cancer Neg Hx   . Sickle cell anemia Neg Hx     SOCIAL HISTORY: History  Substance Use Topics  . Smoking status: Former Smoker -- 0.10 packs/day for 1 years    Types: Cigarettes    Quit date: 01/02/1960  . Smokeless tobacco: Never Used  . Alcohol Use: No    Allergies  Allergen Reactions  . Ibuprofen Other (See Comments)    PT limited intake because of chronic kidney DX    Current Facility-Administered Medications  Medication Dose Route Frequency Provider Last Rate Last Dose  . amLODipine (NORVASC) tablet 10 mg  10 mg Oral QHS Lucious Groves, DO   10 mg at 03/03/14 2319  . atenolol (TENORMIN) tablet 25 mg  25 mg Oral  QHS Lucious Groves, DO   25 mg at 03/03/14 2319  . buPROPion (WELLBUTRIN XL) 24 hr tablet 150 mg  150 mg Oral Daily Lucious Groves, DO   150 mg at 03/03/14 1656  . cinacalcet (SENSIPAR) tablet 30 mg  30 mg Oral BID WC Lucious Groves, DO   30 mg at 03/03/14 2122  . colchicine tablet 0.3 mg  0.3 mg Oral Daily Lucious Groves, DO      . furosemide (LASIX) tablet 40 mg  40 mg Oral Daily Lucious Groves, DO   40 mg at 03/03/14 1657  . heparin injection 5,000 Units  5,000 Units Subcutaneous 3 times per day Lucious Groves, DO   5,000 Units at 03/04/14 0640  . multivitamin with minerals tablet 1 tablet  1 tablet Oral Daily Truman Hayward, MD      . oxyCODONE-acetaminophen (PERCOCET/ROXICET) 5-325 MG per tablet 1 tablet  1 tablet Oral Q6H PRN Lucious Groves, DO   1 tablet at 03/03/14 1645  . pantoprazole (PROTONIX) EC tablet 40 mg  40 mg Oral Daily Lucious Groves, DO   40 mg at 03/03/14 1658   REVIEW OF SYSTEMS: Valu.Nieves ] denotes positive finding; [  ] denotes negative finding CARDIOVASCULAR:  [ ]  chest pain   [ ]  chest pressure   [ ]  palpitations   [ ]  orthopnea   [ ]  dyspnea on exertion   [ ]  claudication   [ ]  rest pain   [ ]  DVT   [ ]  phlebitis PULMONARY:   [ ]  productive cough   [ ]  asthma   [ ]  wheezing NEUROLOGIC:   [ ]  weakness  [ ]  paresthesias  [ ]  aphasia  [ ]  amaurosis  [ ]  dizziness HEMATOLOGIC:   [ ]  bleeding problems   [ ]  clotting disorders MUSCULOSKELETAL:  Valu.Nieves ] joint pain   [ ]  joint swelling [ ]  leg swelling GASTROINTESTINAL: [ ]   blood in stool  [ ]   hematemesis GENITOURINARY:  [ ]   dysuria  [ ]   hematuria PSYCHIATRIC:  [ ]  history of major depression INTEGUMENTARY:  [ ]  rashes  [ ]  ulcers CONSTITUTIONAL:  [ ]  fever   [ ]  chills  PHYSICAL EXAM: Filed Vitals:   03/03/14 1550 03/03/14 2132 03/04/14 0614  BP: 154/78 129/77 136/71  Pulse: 71 63 67  Temp: 98.2 F (36.8 C) 98.6 F (37 C) 98.8 F (37.1 C)  TempSrc: Oral Oral   Resp: 16 16 16   Height: 5\' 6"  (1.676 m)    Weight:  169 lb (76.658 kg)    SpO2: 99% 99% 99%  Body mass index is 27.29 kg/(m^2). GENERAL: The patient is a well-nourished female, in no acute distress. The vital signs are documented above. CARDIOVASCULAR: There is a regular rate and rhythm. I do not detect carotid bruits. She has palpable radial pulses.  PULMONARY: There is good air exchange bilaterally without wheezing or rales. ABDOMEN: Soft and non-tender with normal pitched bowel sounds.  MUSCULOSKELETAL: There is swelling of the left elbow with mild erythema. NEUROLOGIC: No focal weakness or paresthesias are detected. SKIN: There are no ulcers or rashes noted. PSYCHIATRIC: The patient has a normal affect. Her left forearm AV graft is chronically occluded without a bruit or thrill.  DATA:  Lab Results  Component Value Date   WBC 6.7 03/03/2014   HGB 10.9* 03/03/2014   HCT 33.8* 03/03/2014   MCV 92.1 03/03/2014   PLT 278 03/03/2014   Lab Results  Component Value Date   NA 137 03/03/2014   K 3.9 03/03/2014   CL 101 03/03/2014   CO2 27 03/03/2014   Lab Results  Component Value Date   CREATININE 2.17* 03/03/2014   No results found for: INR, PROTIME Lab Results  Component Value Date   HGBA1C 6.1* 02/10/2014   X-RAY OF THE LEFT ELBOW: This shows an effusion of the left elbow. No fracture is noted.  MEDICAL ISSUES:  STATUS POST LEFT FOREARM AV GRAFT IN 2009: The patient has a chronically occluded left forearm AV graft. On exam this does not appear to be infected. There is no erythema or swelling. Likewise she is afebrile with a normal white blood cell count. At this point I would not recommend removal of the graft as it does not appear to be infected. I have ordered a duplex of the graft to look for any perigraft fluid or other abnormalities. However most likely her pain is related to the effusion in her left elbow. Vascular surgery will follow.  [place] [facility] [place] Beeper:  [phone]

## 2014-03-04 NOTE — Progress Notes (Signed)
VASCULAR LAB PRELIMINARY  PRELIMINARY  PRELIMINARY  PRELIMINARY  Upper extremity duplex of AV graft completed.    Preliminary report:  No evidence of perigraft fluid or abnormal appearance of surrounding tissues.  Daven Montz, RVT 03/04/2014, 2:39 PM

## 2014-03-04 NOTE — Progress Notes (Signed)
UR completed 

## 2014-03-04 NOTE — Progress Notes (Signed)
Subjective: Pt complaining of left elbow pain and left antecubital fossa pain. States medications given last night completely took pain away. Likely pt is talking about percocet given last night rather than colchicine as pt was tender to palpation on exam.   Objective: Vital signs in last 24 hours: Filed Vitals:   03/03/14 1550 03/03/14 2132 03/04/14 0614  BP: 154/78 129/77 136/71  Pulse: 71 63 67  Temp: 98.2 F (36.8 C) 98.6 F (37 C) 98.8 F (37.1 C)  TempSrc: Oral Oral   Resp: 16 16 16   Height: 5\' 6"  (1.676 m)    Weight: 169 lb (76.658 kg)    SpO2: 99% 99% 99%   Weight change:   Intake/Output Summary (Last 24 hours) at 03/04/14 [id number] Last data filed at 03/04/14 0914  Gross per 24 hour  Intake    240 ml  Output      0 ml  Net    240 ml   General: NAD, sitting in recliner at bedside HEENT: moist mucous membranes Lungs: CTAB, no wheezing Cardiac: RRR, no murmurs GI: soft, active bowel sounds, not TTP  Neuro: 2/5 on left hand grip- poor effort due to pain, good left hand abductor strength Ext: tender to palpation over left elbow joint, could not appreciate erythema, positive for warmth. Left antecubital fossa was tender to palpation. AV graft with good bruit, absent thrill.  Lab Results: Basic Metabolic Panel:  Recent Labs Lab 03/03/14 1915  NA 137  K 3.9  CL 101  CO2 27  GLUCOSE 102*  BUN 31*  CREATININE 2.17*  CALCIUM 8.9   CBC:  Recent Labs Lab 03/03/14 1915  WBC 6.7  NEUTROABS 4.3  HGB 10.9*  HCT 33.8*  MCV 92.1  PLT 278   Medications: I have reviewed the patient's current medications. Scheduled Meds: . amLODipine  10 mg Oral QHS  . atenolol  25 mg Oral QHS  . buPROPion  150 mg Oral Daily  . cinacalcet  30 mg Oral BID WC  . colchicine  0.3 mg Oral Daily  . furosemide  40 mg Oral Daily  . heparin  5,000 Units Subcutaneous 3 times per day  . multivitamin with minerals  1 tablet Oral Daily  . pantoprazole  40 mg Oral Daily   Continuous  Infusions:  PRN Meds:.oxyCODONE-acetaminophen Assessment/Plan: Principal Problem:   Pain and swelling of left elbow Active Problems:   Essential hypertension   CKD (chronic kidney disease) stage 4, GFR 15-29 ml/min   Left antecubital fossa and elbow pain--pain controlled with percocet overnight, questionable if colchicine helped with pain as she was given both meds last night. Possible pain is due to infectious etiology from introduction of bacteria from previous arthrocentesis in the ED on 02/21/14. - VVS saw patient this morning, do not believe she has an AV graft infxn, ordered a duplex of the graft to look for perigraft fluid or any other abnormalities.  - ordered left elbow joint aspiration with synovial fluid studies - percocet q6h prn for pain  HTN - continue home norvasc 10mg , atenolol 25mg , and lasix 40mg  BID  CKD4--b/l around 2.25. She follows w/ Dr. Moshe Cipro. Creatinine this morning 2.17 - continue to monitor  GERD-- on prilosec 20mg  at home - protoninx 40mg  daily  Diet-- renal  DVT ppx-- hep [place]  Dispo: Disposition is deferred at this time, awaiting improvement of current medical problems. Anticipated discharge in approximately 1-2 day(s).   The patient does have a current PCP Axel Filler, MD)  and does need an [facility] hospital follow-up appointment after discharge.  The patient does not have transportation limitations that hinder transportation to clinic appointments.   .Services Needed at time of discharge: Y = Yes, Blank = No PT:   OT:   RN:   Equipment:   Other:       Julious Oka, MD 03/04/2014, 9:17 AM

## 2014-03-05 DIAGNOSIS — M25422 Effusion, left elbow: Secondary | ICD-10-CM | POA: Diagnosis not present

## 2014-03-05 DIAGNOSIS — D509 Iron deficiency anemia, unspecified: Secondary | ICD-10-CM | POA: Diagnosis not present

## 2014-03-05 DIAGNOSIS — Z9841 Cataract extraction status, right eye: Secondary | ICD-10-CM | POA: Diagnosis not present

## 2014-03-05 DIAGNOSIS — F329 Major depressive disorder, single episode, unspecified: Secondary | ICD-10-CM | POA: Diagnosis not present

## 2014-03-05 DIAGNOSIS — Z9851 Tubal ligation status: Secondary | ICD-10-CM | POA: Diagnosis not present

## 2014-03-05 DIAGNOSIS — M180 Bilateral primary osteoarthritis of first carpometacarpal joints: Secondary | ICD-10-CM | POA: Diagnosis not present

## 2014-03-05 DIAGNOSIS — M10022 Idiopathic gout, left elbow: Secondary | ICD-10-CM

## 2014-03-05 DIAGNOSIS — Z886 Allergy status to analgesic agent status: Secondary | ICD-10-CM | POA: Diagnosis not present

## 2014-03-05 DIAGNOSIS — N184 Chronic kidney disease, stage 4 (severe): Secondary | ICD-10-CM | POA: Diagnosis not present

## 2014-03-05 DIAGNOSIS — Z90710 Acquired absence of both cervix and uterus: Secondary | ICD-10-CM | POA: Diagnosis not present

## 2014-03-05 DIAGNOSIS — Z87891 Personal history of nicotine dependence: Secondary | ICD-10-CM | POA: Diagnosis not present

## 2014-03-05 DIAGNOSIS — Z8673 Personal history of transient ischemic attack (TIA), and cerebral infarction without residual deficits: Secondary | ICD-10-CM | POA: Diagnosis not present

## 2014-03-05 DIAGNOSIS — I129 Hypertensive chronic kidney disease with stage 1 through stage 4 chronic kidney disease, or unspecified chronic kidney disease: Secondary | ICD-10-CM | POA: Diagnosis not present

## 2014-03-05 DIAGNOSIS — Z9842 Cataract extraction status, left eye: Secondary | ICD-10-CM | POA: Diagnosis not present

## 2014-03-05 DIAGNOSIS — T82898A Other specified complication of vascular prosthetic devices, implants and grafts, initial encounter: Secondary | ICD-10-CM | POA: Diagnosis not present

## 2014-03-05 DIAGNOSIS — K219 Gastro-esophageal reflux disease without esophagitis: Secondary | ICD-10-CM | POA: Diagnosis not present

## 2014-03-05 LAB — CBC WITH DIFFERENTIAL/PLATELET
Basophils Absolute: 0 10*3/uL (ref 0.0–0.1)
Basophils Relative: 0 % (ref 0–1)
Eosinophils Absolute: 0.3 10*3/uL (ref 0.0–0.7)
Eosinophils Relative: 5 % (ref 0–5)
HCT: 34.8 % — ABNORMAL LOW (ref 36.0–46.0)
Hemoglobin: 11.3 g/dL — ABNORMAL LOW (ref 12.0–15.0)
Lymphocytes Relative: 35 % (ref 12–46)
Lymphs Abs: 1.8 10*3/uL (ref 0.7–4.0)
MCH: 29.5 pg (ref 26.0–34.0)
MCHC: 32.5 g/dL (ref 30.0–36.0)
MCV: 90.9 fL (ref 78.0–100.0)
Monocytes Absolute: 0.4 10*3/uL (ref 0.1–1.0)
Monocytes Relative: 8 % (ref 3–12)
Neutro Abs: 2.6 10*3/uL (ref 1.7–7.7)
Neutrophils Relative %: 52 % (ref 43–77)
Platelets: 290 10*3/uL (ref 150–400)
RBC: 3.83 MIL/uL — ABNORMAL LOW (ref 3.87–5.11)
RDW: 13.4 % (ref 11.5–15.5)
WBC: 5 10*3/uL (ref 4.0–10.5)

## 2014-03-05 MED ORDER — COLCHICINE 0.6 MG PO TABS
0.3000 mg | ORAL_TABLET | Freq: Every day | ORAL | Status: DC
Start: 2014-03-05 — End: 2014-04-05

## 2014-03-05 MED ORDER — PANTOPRAZOLE SODIUM 40 MG PO TBEC
40.0000 mg | DELAYED_RELEASE_TABLET | Freq: Two times a day (BID) | ORAL | Status: DC
  Administered 2014-03-05: 40 mg via ORAL
  Filled 2014-03-05: qty 1

## 2014-03-05 MED ORDER — PANTOPRAZOLE SODIUM 40 MG PO TBEC
40.0000 mg | DELAYED_RELEASE_TABLET | Freq: Once | ORAL | Status: AC
  Administered 2014-03-05: 40 mg via ORAL
  Filled 2014-03-05: qty 1

## 2014-03-05 MED ORDER — PANTOPRAZOLE SODIUM 40 MG PO TBEC: 40.0000 mg | DELAYED_RELEASE_TABLET | Freq: Two times a day (BID) | ORAL | Status: DC

## 2014-03-05 MED ORDER — COLCHICINE 0.6 MG PO TABS
0.3000 mg | ORAL_TABLET | Freq: Every day | ORAL | Status: DC
  Administered 2014-03-05: 0.3 mg via ORAL
  Filled 2014-03-05: qty 1

## 2014-03-05 NOTE — Discharge Summary (Signed)
Name: [name] MRN: [id number] DOB: 03-Dec-1934 80 y.o. PCP: Axel Filler, MD  Date of Admission: 03/03/2014  2:38 PM Date of Discharge: 03/05/2014 Attending Physician: Truman Hayward, MD  Discharge Diagnosis:  Principal Problem:   Acute gout of left elbow Active Problems:   Essential hypertension   CKD (chronic kidney disease) stage 4, GFR 15-29 ml/min   Pain   Elbow effusion   AV graft malfunction  Discharge Medications:   Medication List    ASK your doctor about these medications        amLODipine 10 MG tablet  Commonly known as:  NORVASC  Take 1 tablet (10 mg total) by mouth at bedtime.     ASPERCREME EX  Apply 1 application topically daily as needed (hand pain).     atenolol 25 MG tablet  Commonly known as:  TENORMIN  Take 1 tablet (25 mg total) by mouth at bedtime.     buPROPion 150 MG 24 hr tablet  Commonly known as:  WELLBUTRIN XL  Take 1 tablet (150 mg total) by mouth daily.     CENTRUM SILVER ADULT 50+ PO  Take 1 tablet by mouth daily.     cinacalcet 30 MG tablet  Commonly known as:  SENSIPAR  Take 1 tablet (30 mg total) by mouth daily.     diphenhydramine-acetaminophen 25-500 MG Tabs  Commonly known as:  TYLENOL PM  Take 1 tablet by mouth at bedtime as needed (sleep).     furosemide 80 MG tablet  Commonly known as:  LASIX  Take 0.5 tablets (40 mg total) by mouth daily.     meclizine 12.5 MG tablet  Commonly known as:  ANTIVERT  Take 12.5 mg by mouth daily.     omeprazole 20 MG capsule  Commonly known as:  PRILOSEC  Take 1 capsule (20 mg total) by mouth 2 (two) times daily.     oxyCODONE-acetaminophen 5-325 MG per tablet  Commonly known as:  PERCOCET/ROXICET  1 tab PO q12h prn pain     traMADol 50 MG tablet  Commonly known as:  ULTRAM  Take 2 tablets (100 mg total) by mouth every 12 (twelve) hours as needed for severe pain.        Disposition and follow-up:   NicholeNichole Cole was discharged from [facility] in Good condition.  At the hospital follow up visit please address:  1.  Gout left elbow: how is pain.  May need steroids if worse, please check kidney function if worse d/c colchicine.  2.  Labs / imaging needed at time of follow-up: BMP, consider uric acid in a few weeks.  3.  Pending labs/ test needing follow-up: Cultures from synoval fluid.  Follow-up Appointments:   Discharge Instructions:   Consultations: Treatment Team:  Angelia Mould, MD  Procedures Performed:  Dg Chest 2 View  02/09/2014   CLINICAL DATA:  Three-day history of short sternal chest pain and pressure  EXAM: CHEST  2 VIEW  COMPARISON:  March 12, 2012  FINDINGS: There is evidence of a degree of underlying emphysematous change. There is no edema or consolidation. Heart size and pulmonary vascularity are within normal limits. No adenopathy. There is degenerative change in the thoracic spine.  IMPRESSION: Underlying emphysematous change.  No edema or consolidation.   Electronically Signed   By: Lowella Grip III M.D.   On: 02/09/2014 12:49   Dg Elbow Complete Left  02/21/2014   CLINICAL DATA:  Severe pain in left elbow for 1 day.  No known in  EXAM: LEFT ELBOW - COMPLETE 3+ VIEW  COMPARISON:  None.  FINDINGS: There is a prominent anterior fat pad, indicative of joint effusion or hemarthrosis. No fracture is evident. There is no bone lesion or bony destruction.  IMPRESSION: Left elbow joint effusion   Electronically Signed   By: Andreas Newport M.D.   On: 02/21/2014 06:07   Nm Myocar Multi W/spect W/wall Motion / Ef  02/10/2014   HISTORY OF PRESENT ILLNESS: Nuclear Med Background  Indication for Stress Test:  Chest pain  History:  This is a 80 y.o. female with a past medical history significant for hypertension, renal failure with unknown etiology, formally on hemodialysis, history of gastritis with Helicobacter pylori, pericardial effusion with a question of possible collagen vascular disease, however  rheumatologic diagnosis cannot be confirmed. She has no history of coronary disease. She had an echocardiogram in 2014 which interestingly showed mild LVH, EF of 60%, and upper normal left atrial size.  She presents today with a couple episodes of chest discomfort. The first happened a few days ago and was short lasting. She described it as a squeezing in the center of the chest, there was some nausea and associated diaphoresis. This episode recurred today and again felt like a pressure in the chest that lasted for 15-20 minutes. There is associated with a hot and flushed feeling across the chest and radiated up to her neck. It was not at exertion or relieved by rest. She had not eaten and only drank a half a cup of coffee in the morning. She reports that she has not missed any of her medications for reflux. She thinks the symptoms are different than her prior reflux symptoms.  Cardiac Risk Factors:  HTN, CKD  Symptoms:  Chest pain  PROCEDURE: Nuclear Pre-Procedure  Caffeine/Decaff Intake:  NPO After: MN.  Lungs:  O2 Stat:  IV 0.9% NS with Angio Cath:  Chest Size (in):  Cup Size:  Height: 5'2"  Weight: 161  BMI:  Tech Comments:  Nuclear Med Study  1 or 2 day study: 1  Stress Test Type: Lexiscan  Reading MD: Hilty  Order Authorizing Provider: Comer  Resting Radionuclide: Sestamibi  Resting Radionuclide Dose:  10 mCi  Stress Radionuclide: Sestamibi  Stress Radionuclide Dose:  30 mCi  Stress Protocol  Rest HR: 78  Stress HR: 90  Rest BP: 143/82  Stress BP: 154/79  Exercise Time (min):  METS:  Dose of Adenosine (mg):  Dose of Lexiscan:  0.4 mg  Dose of Atropine (mg):  Dose of Dobutamine:  Stress Test Technologist:  Nuclear Technologist:  Rest Procedure:  Stress Procedure:  Transient Ischemic Dilatation (Normal <1.22): 0.6  Lung/Heart Ratio (Normal <0.45):  QGS EDV: 46  QGS ESV: 13  LV Ejection Fraction: 72%  Rest ECG: NSR  Stress ECG:  No changes  Raw Data Images:  No significant artifacts  Stress Images:  No reversible  ischemia  Rest Images:  No reversible ischemia  Subtraction (SDS):  0  IMPRESSION: Exercise Capacity: N/A  BP Response: Normal  Clinical Symptoms: None  ECG Impression:  No lexiscan changes  Comparison with Prior Nuclear Study: None  Final Impression:  Normal stress nuclear perfusion study.  LVEF 72%   Electronically Signed   By: Pixie Casino   On: 02/10/2014 13:16   Dg Chest Port 1 View  02/21/2014   CLINICAL DATA:  Sudden left arm pain  EXAM: PORTABLE  CHEST - 1 VIEW  COMPARISON:  02/09/2014  FINDINGS: A single AP portable view of the chest demonstrates no focal airspace consolidation or alveolar edema. The lungs are grossly clear. There is no large effusion or pneumothorax. There is unchanged cardiomegaly. Cardiac and mediastinal contours are otherwise unremarkable.  IMPRESSION: No active disease.   Electronically Signed   By: Andreas Newport M.D.   On: 02/21/2014 04:35   Dg Fluoro Guide Ndl Plc/bx  03/04/2014   CLINICAL DATA:  Left elbow and antecubital fossa pain for in 2 weeks. History of previous hemodialysis, hypertension and arthritis. Initial encounter.  EXAM: FLUORO GUIDED NEEDLE PLACEMENT (left elbow joint aspiration)  TECHNIQUE: Time out procedure was performed. The procedure was discussed with the patient and informed written consent was obtained. The left elbow was draped and prepped in sterile fashion. 1% lidocaine was administered as local anesthetic. A 21 gauge hypodermic needle was advanced into the posterolateral aspect of the elbow joint under fluoroscopic guidance. 4 ml of bloody cloudy fluid was aspirated without immediate complications. No contrast was injected into the joint.  CONTRAST:  None.  FLUOROSCOPY TIME:  Radiation Exposure Index (as provided by the fluoroscopic device): Examination was performed using low dose fluoroscopy. Images were recorded with fluoro-store. Number of separate spot images:None. Dose area product: 0.87 uGy*m2.  If the device does not provide the exposure  index:  Fluoroscopy Time (in minutes and seconds):  6 seconds  Number of Acquired Images:  COMPARISON:  Radiographs 02/21/2014  FINDINGS: Left elbow aspiration as described.  IMPRESSION: Diagnostic left elbow aspiration, yielding 4 ml of bloody cloudy fluid. Fluid was sent for the requested studies.   Electronically Signed   By: Richardean Sale M.D.   On: 03/04/2014 13:45   Left Elbow synoval fluid Results for HIKARI, DIEKER (MRN [id number]) as of 03/05/2014 14:53  Ref. Range 03/04/2014 13:28  Color, Synovial Latest Range: YELLOW  ORANGE (A)  Appearance-Synovial Latest Range: CLEAR  HAZY (A)  Crystals, Fluid No range found INTRACELLULAR MON...  WBC, Synovial Latest Range: 0-200 /cu mm 14339 (H)  Neutrophil, Synovial Latest Range: 0-25 % 97 (H)  Lymphocytes-Synovial Fld Latest Range: 0-20 % 1  Monocyte-Macrophage-Synovial Fluid Latest Range: 50-90 % 2 (L)  Eosinophils-Synovial Latest Range: 0-1 % 0  Other Cells-SYN No range found 0  No  Admission HPI: Pt is a 80 y/o female with PMHx of HTN, arthritis, and CKD4- no longer on HD who presents with left elbow and antecubital fossa pain x 2 weeks. Pt was seen in the ED on 02/21/14 for left elbow pain. Lt elbow xray revealed an effusion, neg for fractures. Arthrocentesis was done at that time and synovial fluid was positive for 1342 WBCs and neg for crystals consistent with noninflammatory arthritis .LUE doppler was negative for DVT or SVT. Pt was discharged home with pain meds and f/u with Dr. Erlinda Hong w/ ortho the following week. She follows w/ [facility] and last saw them on 2/26 for right knee pain and pt was continued on tramadol for pain with option of steroid injections in the future. She was then seen in clinic today with complaint of left antecubital fossa pain and left elbow pain that initially began on 02/20/14, one day prior to ED visit, and pain was relieved with joint aspiration and tramadol. Then 2 weeks ago patient started experiencing  pain in her left antecubital fossa and she noted that tramadol was no longer controlling her left elbow pain. She states after left  elbow aspiration she had swelling of left antecubital fossa that has since gone down. She is complaining of severe lt antecubital fossa pain and cannot fully extended or flex at elbow due to pain. Denies hx of pain similar to this before. Pt was then directly admitted to IMTS due to concern for vascular or infectious AV graft complications. Pt denies night sweats, fevers, chills, cervical lymphadenopathy, and n/v.   Hospital Course by problem list: Principal Problem:   Acute gout of left elbow Active Problems:   Essential hypertension   CKD (chronic kidney disease) stage 4, GFR 15-29 ml/min   Pain   Elbow effusion   AV graft malfunction   [name] is a 79 y/o female with of HTN, arthritis, and CKD4 with L antecubital fossa graft (no longer on HD) who was admitted for acute gout attack. Her hospital course is described by problem below.   Acute gout attack, L elbow: [name] presented with severely painful, warm, tender effusion of her left elbow and antecubital fossa. She was directly admitted due to concern for vascular or infectious AV graft complications, as she had an arthrocentesis performed 2 weeks prior which showed a noninfectious arthritis with no crystals but a high uric acid.  We empirically gave a 1.2mg  dose of colchicine. Vascular surgery was consulted. They ordered a duplex of the graft which showed no perigraft fluid or any other abnormalities, so they were not concerned for an AV graft infection. An IR guided aspirate of the joint demonstrated monosodium urate crystals with a WBC of 14,000. By the morning of 3/4 she was symptomatically greatly improved.  She was continued on renal dosed colchicine at 0.3mg  as she was symptomatically much improved after the first dose of colchicine so steroids were deferred at this time.. Pain was controlled with  Percocet. Cultures were ngtd x1 at time of discharge. No steroids were given. The patient will be maintained on colchicine for now and will ultimately benefit from allopurinol for gout prevention.   HTN: Continued home norvasc 10mg , atenolol 25mg , and lasix 40mg  BID.   CKD4: Pt with baseline Cr. ~ 2.25. She follows w/ Dr. Moshe Cipro.   GERD-Pt was treated with protonix 40 mg daily. She is on prilosec 20mg  at home.     Discharge Vitals:   BP 121/78 mmHg  Pulse 64  Temp(Src) 97.4 F (36.3 C) (Oral)  Resp 17  Ht 5\' 6"  (1.676 m)  Wt 169 lb (76.658 kg)  BMI 27.29 kg/m2  SpO2 100%  Discharge Labs:  Results for orders placed or performed during the hospital encounter of 03/03/14 (from the past 24 hour(s))  Body fluid culture     Status: None (Preliminary result)   Collection Time: 03/04/14  1:28 PM  Result Value Ref Range   Specimen Description FLUID SYNOVIAL    Special Requests LEFT ELBOW SYNOVIAL FLUID    Gram Stain      FEW WBC PRESENT, PREDOMINANTLY PMN NO ORGANISMS SEEN Performed at [facility]    Culture NO GROWTH Performed at [facility]     Report Status PENDING   Synovial cell count + diff, w/ crystals     Status: Abnormal   Collection Time: 03/04/14  1:28 PM  Result Value Ref Range   Color, Synovial ORANGE (A) YELLOW   Appearance-Synovial HAZY (A) CLEAR   Crystals, Fluid INTRACELLULAR MONOSODIUM URATE CRYSTALS    WBC, Synovial 14339 (H) 0 - 200 /cu mm   Neutrophil, Synovial 97 (H) 0 -  25 %   Lymphocytes-Synovial Fld 1 0 - 20 %   Monocyte-Macrophage-Synovial Fluid 2 (L) 50 - 90 %   Eosinophils-Synovial 0 0 - 1 %   Other Cells-SYN 0   Fungus Culture with Smear     Status: None (Preliminary result)   Collection Time: 03/04/14  1:28 PM  Result Value Ref Range   Specimen Description SYNOVIAL    Special Requests NONE    Fungal Smear      NO YEAST OR FUNGAL ELEMENTS SEEN Performed at [facility]    Culture      CULTURE IN PROGRESS FOR  FOUR WEEKS Performed at [facility]    Report Status PENDING   CBC with Differential/Platelet     Status: Abnormal   Collection Time: 03/05/14  5:28 AM  Result Value Ref Range   WBC 5.0 4.0 - 10.5 K/uL   RBC 3.83 (L) 3.87 - 5.11 MIL/uL   Hemoglobin 11.3 (L) 12.0 - 15.0 g/dL   HCT 34.8 (L) 36.0 - 46.0 %   MCV 90.9 78.0 - 100.0 fL   MCH 29.5 26.0 - 34.0 pg   MCHC 32.5 30.0 - 36.0 g/dL   RDW 13.4 11.5 - 15.5 %   Platelets 290 150 - 400 K/uL   Neutrophils Relative % 52 43 - 77 %   Neutro Abs 2.6 1.7 - 7.7 K/uL   Lymphocytes Relative 35 12 - 46 %   Lymphs Abs 1.8 0.7 - 4.0 K/uL   Monocytes Relative 8 3 - 12 %   Monocytes Absolute 0.4 0.1 - 1.0 K/uL   Eosinophils Relative 5 0 - 5 %   Eosinophils Absolute 0.3 0.0 - 0.7 K/uL   Basophils Relative 0 0 - 1 %   Basophils Absolute 0.0 0.0 - 0.1 K/uL    Signed: Lucious Groves, DO 03/05/2014, 12:36 PM    Services Ordered on Discharge: none Equipment Ordered on Discharge: none

## 2014-03-05 NOTE — Progress Notes (Signed)
UR completed 

## 2014-03-05 NOTE — Progress Notes (Signed)
   VASCULAR SURGERY ASSESSMENT & PLAN:  * Left elbow pain better. No more pain at antecubital area.  * Duplex of graft was unremarkable. No fluid around graft. No evidence of infection.    * Vascular surgery will be available as needed.   SUBJECTIVE: Left arm feels better  PHYSICAL EXAM: Filed Vitals:   03/04/14 1025 03/04/14 1422 03/04/14 2157 03/05/14 0527  BP: 138/74 149/77 126/82 121/78  Pulse: 67 67 69 64  Temp: 98.8 F (37.1 C) 98.8 F (37.1 C) 98.9 F (37.2 C) 97.4 F (36.3 C)  TempSrc: Oral Oral Oral Oral  Resp: 18 18 17 17   Height:      Weight:      SpO2: 100% 100% 100% 100%   No change in exam.   LABS: Lab Results  Component Value Date   WBC 5.0 03/05/2014   HGB 11.3* 03/05/2014   HCT 34.8* 03/05/2014   MCV 90.9 03/05/2014   PLT 290 03/05/2014   Lab Results  Component Value Date   CREATININE 2.17* 03/03/2014   No results found for: INR, PROTIME CBG (last 3)  No results for input(s): GLUCAP in the last 72 hours.  Principal Problem:   Pain and swelling of left elbow Active Problems:   Essential hypertension   CKD (chronic kidney disease) stage 4, GFR 15-29 ml/min   Pain   Elbow effusion   AV graft malfunction    Gae Gallop Beeper: [id number] 03/05/2014

## 2014-03-05 NOTE — Progress Notes (Signed)
Subjective: Elbow feels much better, able to move it more.  Still taking some extra pain medications but doing very well. Has been up walking hallways Objective: Vital signs in last 24 hours: Filed Vitals:   03/04/14 1025 03/04/14 1422 03/04/14 2157 03/05/14 0527  BP: 138/74 149/77 126/82 121/78  Pulse: 67 67 69 64  Temp: 98.8 F (37.1 C) 98.8 F (37.1 C) 98.9 F (37.2 C) 97.4 F (36.3 C)  TempSrc: Oral Oral Oral Oral  Resp: 18 18 17 17   Height:      Weight:      SpO2: 100% 100% 100% 100%   Weight change:   Intake/Output Summary (Last 24 hours) at 03/05/14 1236 Last data filed at 03/05/14 0925  Gross per 24 hour  Intake    600 ml  Output      0 ml  Net    600 ml   General: resting in bed Cardiac: RRR, no rubs, murmurs or gallops Pulm: clear to auscultation bilaterally Abd: soft, nontender, nondistended, BS present Ext: left elbow still with small effusion, increased ROM, decreased pain and tenderness and less warmth. Neuro: alert and oriented Lab Results: Basic Metabolic Panel:  Recent Labs Lab 03/03/14 1915  NA 137  K 3.9  CL 101  CO2 27  GLUCOSE 102*  BUN 31*  CREATININE 2.17*  CALCIUM 8.9   Liver Function Tests: No results for input(s): AST, ALT, ALKPHOS, BILITOT, PROT, ALBUMIN in the last 168 hours. No results for input(s): LIPASE, AMYLASE in the last 168 hours. No results for input(s): AMMONIA in the last 168 hours. CBC:  Recent Labs Lab 03/03/14 1915 03/05/14 0528  WBC 6.7 5.0  NEUTROABS 4.3 2.6  HGB 10.9* 11.3*  HCT 33.8* 34.8*  MCV 92.1 90.9  PLT 278 290    Micro Results: Recent Results (from the past 240 hour(s))  Body fluid culture     Status: None (Preliminary result)   Collection Time: 03/04/14  1:28 PM  Result Value Ref Range Status   Specimen Description FLUID SYNOVIAL  Final   Special Requests LEFT ELBOW SYNOVIAL FLUID  Final   Gram Stain   Final    FEW WBC PRESENT, PREDOMINANTLY PMN NO ORGANISMS SEEN Performed at [facility]    Culture NO GROWTH Performed at [facility]   Final   Report Status PENDING  Incomplete  Fungus Culture with Smear     Status: None (Preliminary result)   Collection Time: 03/04/14  1:28 PM  Result Value Ref Range Status   Specimen Description SYNOVIAL  Final   Special Requests NONE  Final   Fungal Smear   Final    NO YEAST OR FUNGAL ELEMENTS SEEN Performed at [facility]    Culture   Final    CULTURE IN PROGRESS FOR FOUR WEEKS Performed at [facility]    Report Status PENDING  Incomplete   Studies/Results: Dg Fluoro Guide Ndl Plc/bx  03/04/2014   CLINICAL DATA:  Left elbow and antecubital fossa pain for in 2 weeks. History of previous hemodialysis, hypertension and arthritis. Initial encounter.  EXAM: FLUORO GUIDED NEEDLE PLACEMENT (left elbow joint aspiration)  TECHNIQUE: Time out procedure was performed. The procedure was discussed with the patient and informed written consent was obtained. The left elbow was draped and prepped in sterile fashion. 1% lidocaine was administered as local anesthetic. A 21 gauge hypodermic needle was advanced into the posterolateral aspect of the elbow joint under fluoroscopic guidance. 4 ml  of bloody cloudy fluid was aspirated without immediate complications. No contrast was injected into the joint.  CONTRAST:  None.  FLUOROSCOPY TIME:  Radiation Exposure Index (as provided by the fluoroscopic device): Examination was performed using low dose fluoroscopy. Images were recorded with fluoro-store. Number of separate spot images:None. Dose area product: 0.87 uGy*m2.  If the device does not provide the exposure index:  Fluoroscopy Time (in minutes and seconds):  6 seconds  Number of Acquired Images:  COMPARISON:  Radiographs 02/21/2014  FINDINGS: Left elbow aspiration as described.  IMPRESSION: Diagnostic left elbow aspiration, yielding 4 ml of bloody cloudy fluid. Fluid was sent for the requested studies.   Electronically  Signed   By: Richardean Sale M.D.   On: 03/04/2014 13:45   Medications: I have reviewed the patient's current medications. Scheduled Meds: . amLODipine  10 mg Oral QHS  . atenolol  25 mg Oral QHS  . buPROPion  150 mg Oral Daily  . cinacalcet  30 mg Oral BID WC  . colchicine  0.3 mg Oral Daily  . furosemide  40 mg Oral Daily  . heparin  5,000 Units Subcutaneous 3 times per day  . multivitamin with minerals  1 tablet Oral Daily  . pantoprazole  40 mg Oral BID AC   Continuous Infusions:  PRN Meds:.oxyCODONE-acetaminophen Assessment/Plan:   Acute gout of left elbow - Diagnostic tap of elbow yesterday showed 14k WBC and uric acid crystals c/w diagnosis of gout. - Patient has received colcrys and is empiraclly doing better. - Will have her continue colcrys at a reduced dose 0.3mg  for now as she is doing better and discharge her with close follow up in the clinic.  If she worsens she will need course of prednisone. - Appreciate vascular surgeon consult and has now signed off. -Will resume tramadol for extra pain control at home.     Essential hypertension Currently well controlled - continue medications    CKD (chronic kidney disease) stage 4, GFR 15-29 ml/min - Stable, will repeat BMP at clinic follow up.   Dispo: Discharge home today  The patient does have a current PCP Axel Filler, MD) and does need an [facility] hospital follow-up appointment after discharge.  The patient does not have transportation limitations that hinder transportation to clinic appointments.  .Services Needed at time of discharge: Y = Yes, Blank = No PT:   OT:   RN:   Equipment:   Other:       Lucious Groves, DO 03/05/2014, 12:36 PM

## 2014-03-05 NOTE — Progress Notes (Signed)
  Date: 03/05/2014  Patient name: [name]  Medical record number: [id number]  Date of birth: 18-Jul-1934   I have seen and evaluated [name] and discussed their care with the Residency Team.   Assessment and Plan: I have seen and evaluated the patient as outlined above. I agree with the formulated Assessment and Plan as detailed in the residents' admission note, with the following changes:   Patient left elbow aspirate showed 14,000 white blood cells, with intracellular crystals consistent with gout.  Today she has a much more favorable exam able to move her elbow around much better not nearly as warm or tender. She seems responding to colchicine so we'll continue her on daily culture seen at 0.3 mg.  She will follow-up in internal medicine clinic and initiate all appear all down the role road while bridging with colchicine.   Truman Hayward, [place] 3/4/20161:33 PM

## 2014-03-05 NOTE — Discharge Instructions (Signed)
Please start taking colchicine 0.3mg  a day for gout.  They may adjust this in clinic.  If pain gets worse then call clinic you may need prednisone.

## 2014-03-05 NOTE — Progress Notes (Signed)
[name] to be D/C'd Home per MD order.  Discussed with the patient and all questions fully answered.  VSS, Skin clean, dry and intact without evidence of skin break down, no evidence of skin tears noted. IV catheter discontinued intact. Site without signs and symptoms of complications. Dressing and pressure applied.  An After Visit Summary was printed and given to the patient. Prescriptions called in to patient's pharmacy.  D/c education completed with patient/family including follow up instructions, medication list, d/c activities limitations if indicated, with other d/c instructions as indicated by MD - patient able to verbalize understanding, all questions fully answered.   Patient instructed to return to ED, call 911, or call MD for any changes in condition.   Patient escorted via [place], and D/C home via private auto.  [name] 03/05/2014 2:30 PM

## 2014-03-08 ENCOUNTER — Encounter: Payer: Self-pay | Admitting: Vascular Surgery

## 2014-03-08 LAB — BODY FLUID CULTURE: Culture: NO GROWTH

## 2014-03-09 ENCOUNTER — Ambulatory Visit (HOSPITAL_COMMUNITY)
Admission: RE | Admit: 2014-03-09 | Discharge: 2014-03-09 | Disposition: A | Discharge status: Home / Self Care | Payer: Medicare Other | Source: Ambulatory Visit | Attending: Vascular Surgery | Admitting: Vascular Surgery

## 2014-03-09 ENCOUNTER — Ambulatory Visit (INDEPENDENT_AMBULATORY_CARE_PROVIDER_SITE_OTHER): Payer: Medicare Other | Admitting: Vascular Surgery

## 2014-03-09 ENCOUNTER — Encounter: Payer: Self-pay | Admitting: Vascular Surgery

## 2014-03-09 VITALS — BP 139/81 | HR 68 | Resp 16 | Ht 62.0 in | Wt 158.0 lb

## 2014-03-09 DIAGNOSIS — M25522 Pain in left elbow: Secondary | ICD-10-CM | POA: Diagnosis not present

## 2014-03-09 DIAGNOSIS — M79602 Pain in left arm: Secondary | ICD-10-CM

## 2014-03-09 NOTE — Progress Notes (Signed)
Subjective:     Patient ID: [name], female   DOB: [birth year], 80 y.o.   MRN: [id number]  HPI this 80 year old female is seen today for evaluation for possible infection of the nonfunctional AV graft in the left forearm. Patient had the graft inserted many years ago and it was used briefly but never function quite normally. Eventually patient's renal function returned in the graft has not been utilized and it has been thrombosed for many years. She was in the hospital last week with elbow pain and there was some concern about potential infection of the graft. She was evaluated by Dr. Shanon Rosser and patient was arranged for ultrasound exam of the graft and she returns today for further follow-up. In the interim fluid has been aspirated from her left elbow and the diagnosis of gout has been made according to the patient. She denies any redness drainage fluctuance or other problems associated with the graft and states that it is "not giving her any trouble".  Past Medical History  Diagnosis Date  . Depression   . Hypertension   . Gastritis, Helicobacter pylori     Noted on EGD 01/18/2009 by [name];  biopsy showed chronic gastritis with Helicobacter pylori, treated by [name].  . Pericardial effusion 11/2004    S/P subxiphoid pericardial window 11/01/2004 by [name].  A question of possible collagen vascular disease had been raised in 2006 due to arthralgias, a history of idiopathic pericarditis, and increased pigmentation of her palms, and she was referred to [name] but he was unable to confirm a rheumatologic diagnosis.  [name] Hemorrhoids, internal 05/09/2005     Found on colonoscopy 05/09/2005 by [name]  . Cataracts, bilateral   . Pinguecula   . Vitreous detachment     "no OR"  . Thigh pain   . Anemia, iron deficiency   . Angiodysplasia of colon 05/09/2005    Single small angiodysplastic lesion in the descending colon found on colonoscopy 05/09/2005  by [name]  . Anemia due to blood loss, chronic   . Stiffness of joint, not elsewhere classified, hand   . Osteoporosis, unspecified 03/06/2012    A DXA bone density scan done 02/20/2012 showed osteoporosis with a lumbar spine T-score of -4.4 and a right femur T-score of -2.8.  Patient has chronic kidney disease with estimated GFR less than 30, and it is not clear how much of her osteoporosis may be due to renal osteodystrophy.    [name] GERD (gastroesophageal reflux disease)   . Chronic knee pain     bilateral  . Chronic ankle pain     bilateral  . Chronic thumb pain   . Renal failure     Formerly on hemodialysis; managed by Dr. Corliss Parish  . Chronic kidney disease, stage IV (severe)     [name] 02/09/2014  . History of blood transfusion     "low blood count"  . Gout   . Epicondylitis   . Arthritis     "knots on my thumbs; left elbow" (03/03/2014)  . Stroke 2007    "when my kidneys went out & I was in a coma; weak LUE/LLE since"    History  Substance Use Topics  . Smoking status: Former Smoker -- 0.10 packs/day for 1 years    Types: Cigarettes    Quit date: 01/02/1960  . Smokeless tobacco: Never Used  . Alcohol Use: No    Family History  Problem Relation Age of  Onset  . Hypertension Brother   . Heart disease Father   . Diabetes Brother   . Breast cancer Neg Hx   . Colon cancer Neg Hx   . Lung cancer Neg Hx   . Cervical cancer Neg Hx   . Sickle cell anemia Neg Hx     Allergies  Allergen Reactions  . Ibuprofen Other (See Comments)    PT limited intake because of chronic kidney DX     Current outpatient prescriptions:  .  amLODipine (NORVASC) 10 MG tablet, Take 1 tablet (10 mg total) by mouth at bedtime., Disp: 60 tablet, Rfl: 5 .  atenolol (TENORMIN) 25 MG tablet, Take 1 tablet (25 mg total) by mouth at bedtime., Disp: 60 tablet, Rfl: 1 .  buPROPion (WELLBUTRIN XL) 150 MG 24 hr tablet, Take 1 tablet (150 mg total) by mouth daily., Disp: 60 tablet, Rfl:  1 .  cinacalcet (SENSIPAR) 30 MG tablet, Take 1 tablet (30 mg total) by mouth daily., Disp: 60 tablet, Rfl: 1 .  diphenhydramine-acetaminophen (TYLENOL PM) 25-500 MG TABS, Take 1 tablet by mouth at bedtime as needed (sleep)., Disp: , Rfl:  .  furosemide (LASIX) 80 MG tablet, Take 0.5 tablets (40 mg total) by mouth daily., Disp: 30 tablet, Rfl: 1 .  meclizine (ANTIVERT) 12.5 MG tablet, Take 12.5 mg by mouth daily. , Disp: , Rfl:  .  Multiple Vitamins-Minerals (CENTRUM SILVER ADULT 50+ PO), Take 1 tablet by mouth daily., Disp: , Rfl:  .  omeprazole (PRILOSEC) 20 MG capsule, Take 1 capsule (20 mg total) by mouth 2 (two) times daily., Disp: 120 capsule, Rfl: 1 .  traMADol (ULTRAM) 50 MG tablet, Take 2 tablets (100 mg total) by mouth every 12 (twelve) hours as needed for severe pain., Disp: 100 tablet, Rfl: 0 .  Trolamine Salicylate (ASPERCREME EX), Apply 1 application topically daily as needed (hand pain)., Disp: , Rfl:  .  colchicine 0.6 MG tablet, Take 0.5 tablets (0.3 mg total) by mouth daily. (Patient not taking: Reported on 03/09/2014), Disp: 15 tablet, Rfl: 0 .  oxyCODONE-acetaminophen (PERCOCET/ROXICET) 5-325 MG per tablet, 1 tab PO q12h prn pain (Patient not taking: Reported on 03/09/2014), Disp: 10 tablet, Rfl: 0 .  [DISCONTINUED] FLUoxetine (PROZAC) 10 MG tablet, Take 1 tablet (10 mg total) by mouth daily., Disp: 30 tablet, Rfl: 2  BP 139/81 mmHg  Pulse 68  Resp 16  Ht 5\' 2"  (1.575 m)  Wt 158 lb (71.668 kg)  BMI 28.89 kg/m2  SpO2 99%  Body mass index is 28.89 kg/(m^2).         Review of Systems denies chest pain, pain in the left hand, hemoptysis, orthopnea.     Objective:   Physical Exam BP 139/81 mmHg  Pulse 68  Resp 16  Ht 5\' 2"  (1.575 m)  Wt 158 lb (71.668 kg)  BMI 28.89 kg/m2  SpO2 99%  Gen. elderly female in no apparent distress alert and oriented 3 Lungs no rhonchi or wheezing Upper extremity with 2+ brachial and radial pulse palpable. Left forearm AV graft which  has no pulse or palpable thrill and has been revised into the distal upper arm. There is no erythema, fluctuance, drainage, or other abnormalities noted. Left elbow is mildly swollen but not tender to movement.  Today I reviewed the results of the ultrasound study performed at [facility] last week which revealed no evidence of fluid surrounding the nonfunctional left forearm AV graft.     Assessment:  No evidence of infection of nonfunctional left forearm AV graft which is asymptomatic Ultrasound reveals no fluid around graft Recent diagnosis of gout and left elbow    Plan:     No further vascular workup indicated-return to see me on when necessary basis

## 2014-03-12 ENCOUNTER — Ambulatory Visit: Payer: Medicare Other | Admitting: Internal Medicine

## 2014-03-16 ENCOUNTER — Encounter: Payer: Self-pay | Admitting: Internal Medicine

## 2014-03-16 ENCOUNTER — Ambulatory Visit (INDEPENDENT_AMBULATORY_CARE_PROVIDER_SITE_OTHER): Payer: Medicare Other | Admitting: Internal Medicine

## 2014-03-16 VITALS — BP 130/80 | HR 71 | Temp 98.3°F | Ht 62.0 in | Wt 160.5 lb

## 2014-03-16 DIAGNOSIS — M10022 Idiopathic gout, left elbow: Secondary | ICD-10-CM

## 2014-03-16 DIAGNOSIS — N184 Chronic kidney disease, stage 4 (severe): Secondary | ICD-10-CM | POA: Diagnosis not present

## 2014-03-16 DIAGNOSIS — Z87891 Personal history of nicotine dependence: Secondary | ICD-10-CM | POA: Diagnosis not present

## 2014-03-16 DIAGNOSIS — I129 Hypertensive chronic kidney disease with stage 1 through stage 4 chronic kidney disease, or unspecified chronic kidney disease: Secondary | ICD-10-CM | POA: Diagnosis not present

## 2014-03-16 DIAGNOSIS — I10 Essential (primary) hypertension: Secondary | ICD-10-CM

## 2014-03-16 LAB — BASIC METABOLIC PANEL WITH GFR
BUN: 31 mg/dL — ABNORMAL HIGH (ref 6–23)
CO2: 27 mEq/L (ref 19–32)
Calcium: 8.7 mg/dL (ref 8.4–10.5)
Chloride: 104 mEq/L (ref 96–112)
Creat: 2.13 mg/dL — ABNORMAL HIGH (ref 0.50–1.10)
GFR, Est African American: 25 mL/min — ABNORMAL LOW
GFR, Est Non African American: 22 mL/min — ABNORMAL LOW
Glucose, Bld: 79 mg/dL (ref 70–99)
Potassium: 5 mEq/L (ref 3.5–5.3)
Sodium: 139 mEq/L (ref 135–145)

## 2014-03-16 LAB — URIC ACID: Uric Acid, Serum: 9.2 mg/dL — ABNORMAL HIGH (ref 2.4–7.0)

## 2014-03-16 NOTE — Patient Instructions (Signed)
General Instructions:   Please bring your medicines with you each time you come to clinic.  Medicines may include prescription medications, over-the-counter medications, herbal remedies, eye drops, vitamins, or other pills.   Progress Toward Treatment Goals:  Treatment Goal 01/20/2014  Blood pressure unchanged    Self Care Goals & Plans:  Self Care Goal 03/16/2014  Manage my medications take my medicines as prescribed; bring my medications to every visit; refill my medications on time  Monitor my health -  Eat healthy foods drink diet soda or water instead of juice or soda; eat more vegetables; eat foods that are low in salt; eat baked foods instead of fried foods  Be physically active -  Meeting treatment goals -    No flowsheet data found.   Care Management & Community Referrals:  Referral 03/03/2014  Referrals made for care management support -  Referrals made to community resources none

## 2014-03-16 NOTE — Progress Notes (Signed)
Internal Medicine Clinic Attending  Case discussed with Dr. Glenn soon after the resident saw the patient.  We reviewed the resident's history and exam and pertinent patient test results.  I agree with the assessment, diagnosis, and plan of care documented in the resident's note. 

## 2014-03-16 NOTE — Assessment & Plan Note (Signed)
Cr 2.17 on 03/03/14 prior to hospital discharge. She was started on colchicine in the hospital for acute gout flare, which was renally dosed.  - Checking BMP today to assess renal function.

## 2014-03-16 NOTE — Assessment & Plan Note (Signed)
BP Readings from Last 3 Encounters:  03/16/14 130/80  03/09/14 139/81  03/05/14 121/78    Lab Results  Component Value Date   NA 137 03/03/2014   K 3.9 03/03/2014   CREATININE 2.17* 03/03/2014    Assessment: Blood pressure control:  Good Progress toward BP goal:   At goal Comments: At hospital discharge, she was continued home norvasc 10mg , atenolol 25mg , and lasix 40mg  BID.   Plan: Medications:  continue current medications Educational resources provided: brochure (denies) Self management tools provided:   Other plans: f/u in 2 mo Checking BMP today

## 2014-03-16 NOTE — Progress Notes (Signed)
Patient ID: [name], female   DOB: Mar 25, 1934, 80 y.o.   MRN: [id number]  Subjective:   Patient ID: [name] female   DOB: 1934-06-16 80 y.o.   MRN: [id number]  HPI: [name] is a 80 y.o. F w/ PMH HTN, arthritis, and CKD4, no longer requiring HD presents for a hospital follow after being admitted with acute gout.  She was admitted on 3/4 with left elbow pain concerning for vascular or infectious AV graft complications. She was felt to have an acute gout attack and was started on colchicine 1.2mg  x1 and then 0.3mg  given renal dysfunction.   Today she is feeling great. She denies any joint pain and is ready to get back to doing yoga and water aerobics. She continues to take 0.3mg  colchicine daily.    Past Medical History  Diagnosis Date  . Depression   . Hypertension   . Gastritis, Helicobacter pylori     Noted on EGD 01/18/2009 by [name];  biopsy showed chronic gastritis with Helicobacter pylori, treated by [name].  . Pericardial effusion 11/2004    S/P subxiphoid pericardial window 11/01/2004 by [name].  A question of possible collagen vascular disease had been raised in 2006 due to arthralgias, a history of idiopathic pericarditis, and increased pigmentation of her palms, and she was referred to [name] but he was unable to confirm a rheumatologic diagnosis.  [name] Hemorrhoids, internal 05/09/2005     Found on colonoscopy 05/09/2005 by [name]  . Cataracts, bilateral   . Pinguecula   . Vitreous detachment     "no OR"  . Thigh pain   . Anemia, iron deficiency   . Angiodysplasia of colon 05/09/2005    Single small angiodysplastic lesion in the descending colon found on colonoscopy 05/09/2005 by [name]  . Anemia due to blood loss, chronic   . Stiffness of joint, not elsewhere classified, hand   . Osteoporosis, unspecified 03/06/2012    A DXA bone density scan done 02/20/2012 showed osteoporosis with a lumbar spine  T-score of -4.4 and a right femur T-score of -2.8.  Patient has chronic kidney disease with estimated GFR less than 30, and it is not clear how much of her osteoporosis may be due to renal osteodystrophy.    [name] GERD (gastroesophageal reflux disease)   . Chronic knee pain     bilateral  . Chronic ankle pain     bilateral  . Chronic thumb pain   . Renal failure     Formerly on hemodialysis; managed by Dr. Corliss Parish  . Chronic kidney disease, stage IV (severe)     [name] 02/09/2014  . History of blood transfusion     "low blood count"  . Gout   . Epicondylitis   . Arthritis     "knots on my thumbs; left elbow" (03/03/2014)  . Stroke 2007    "when my kidneys went out & I was in a coma; weak LUE/LLE since"   Current Outpatient Prescriptions  Medication Sig Dispense Refill  . amLODipine (NORVASC) 10 MG tablet Take 1 tablet (10 mg total) by mouth at bedtime. 60 tablet 5  . atenolol (TENORMIN) 25 MG tablet Take 1 tablet (25 mg total) by mouth at bedtime. 60 tablet 1  . buPROPion (WELLBUTRIN XL) 150 MG 24 hr tablet Take 1 tablet (150 mg total) by mouth daily. 60 tablet 1  . cinacalcet (SENSIPAR) 30 MG tablet Take 1 tablet (30 mg  total) by mouth daily. 60 tablet 1  . colchicine 0.6 MG tablet Take 0.5 tablets (0.3 mg total) by mouth daily. (Patient not taking: Reported on 03/09/2014) 15 tablet 0  . diphenhydramine-acetaminophen (TYLENOL PM) 25-500 MG TABS Take 1 tablet by mouth at bedtime as needed (sleep).    . furosemide (LASIX) 80 MG tablet Take 0.5 tablets (40 mg total) by mouth daily. 30 tablet 1  . meclizine (ANTIVERT) 12.5 MG tablet Take 12.5 mg by mouth daily.     . Multiple Vitamins-Minerals (CENTRUM SILVER ADULT 50+ PO) Take 1 tablet by mouth daily.    [name] omeprazole (PRILOSEC) 20 MG capsule Take 1 capsule (20 mg total) by mouth 2 (two) times daily. 120 capsule 1  . oxyCODONE-acetaminophen (PERCOCET/ROXICET) 5-325 MG per tablet 1 tab PO q12h prn pain (Patient not taking: Reported on  03/09/2014) 10 tablet 0  . traMADol (ULTRAM) 50 MG tablet Take 2 tablets (100 mg total) by mouth every 12 (twelve) hours as needed for severe pain. 100 tablet 0  . Trolamine Salicylate (ASPERCREME EX) Apply 1 application topically daily as needed (hand pain).    . [DISCONTINUED] FLUoxetine (PROZAC) 10 MG tablet Take 1 tablet (10 mg total) by mouth daily. 30 tablet 2   No current facility-administered medications for this visit.   Family History  Problem Relation Age of Onset  . Hypertension Brother   . Heart disease Father   . Diabetes Brother   . Breast cancer Neg Hx   . Colon cancer Neg Hx   . Lung cancer Neg Hx   . Cervical cancer Neg Hx   . Sickle cell anemia Neg Hx    History   Social History  . Marital Status: Legally Separated    Spouse Name: N/A  . Number of Children: N/A  . Years of Education: N/A   Social History Main Topics  . Smoking status: Former Smoker -- 0.10 packs/day for 1 years    Types: Cigarettes    Quit date: 01/02/1960  . Smokeless tobacco: Never Used  . Alcohol Use: No  . Drug Use: No  . Sexual Activity: No   Other Topics Concern  . None   Social History Narrative   Review of Systems: Constitutional: Denies fever, chills, diaphoresis, appetite change and fatigue.  HEENT: Denies photophobia, eye pain, redness, hearing loss, ear pain, congestion, sore throat, rhinorrhea, sneezing, mouth sores, trouble swallowing, neck pain, neck stiffness and tinnitus.   Respiratory: Denies SOB, DOE, cough, chest tightness,  and wheezing.   Cardiovascular: Denies chest pain, palpitations and leg swelling.  Gastrointestinal: Denies nausea, vomiting, abdominal pain, diarrhea, constipation, blood in stool and abdominal distention.  Genitourinary: Denies dysuria, urgency, frequency, hematuria, flank pain and difficulty urinating.  Endocrine: Denies: hot or cold intolerance, sweats, changes in hair or nails, polyuria, polydipsia. Musculoskeletal: Denies myalgias, back  pain, joint swelling, arthralgias and gait problem.  Skin: Denies pallor, rash and wound.  Neurological: Denies dizziness, seizures, syncope, weakness, light-headedness, numbness and headaches.  Hematological: Denies adenopathy. Easy bruising, personal or family bleeding history  Psychiatric/Behavioral: Denies suicidal ideation, mood changes, confusion, nervousness, sleep disturbance and agitation  Objective:  Physical Exam: Filed Vitals:   03/16/14 0912  BP: 130/80  Pulse: 71  Temp: 98.3 F (36.8 C)  TempSrc: Oral  Height: 5\' 2"  (1.575 m)  Weight: 160 lb 8 oz (72.802 kg)  SpO2: 99%   Constitutional: Vital signs reviewed.  Patient is a well-developed and well-nourished female in no acute distress and cooperative with  exam.   Head: Normocephalic and atraumatic Eyes: PERRL, EOMI. No scleral icterus.  Neck: Normal ROM, supple Cardiovascular: RRR, no MRG. No peripheral edema. Pulmonary/Chest: Normal respiratory effort, CTAB, no wheezes, rales, or rhonchi Abdominal: Soft. Non-tender, non-distended, bowel sounds are normal.  Musculoskeletal: Kyphosis of thoracic spine present. No erythema, or stiffness, ROM full. Neurological: A&O x3, cranial nerve II-XII are grossly intact, no focal motor deficit.  Skin: Warm, dry and intact.   Psychiatric: Normal mood and affect. Speech and behavior is normal.  Assessment & Plan:   Please refer to Problem List based Assessment and Plan

## 2014-03-16 NOTE — Assessment & Plan Note (Addendum)
Pt presents for hospital f/u of acute gout flare. Synovial fluid with elevated uric acid of 9.0. Colchicine was started in the hospital and the patient is gout free today. She has full ROM of the left elbow w/o erythema or pain. With her h/o CKD 4, checking BMP to assess renal function on the colchicine. If her renal function is stable, will continue the colchicine for approx 6 mo and will begin allopurinol, renally dosed for gout prophylaxis. - Checking BMP  - Checking serum uric acid  Addendum: 03/23/14 Uric acid 9.2 with stable Cr at 2.13, CrCl>20 Will start Allopurinol 100mg  daily, which will likely need to be increased to achieve serum uric acid < 6.0 I have been unable to contact [name] at a reasonable hour while on Night float, so I will ask my nurse to contact her to let her know of her lab results and to let her know that she can pick up the allopurinol at her pharmacy, and she should begin taking it once a day.  - F/u in 1 month

## 2014-03-16 NOTE — Addendum Note (Signed)
Addended by: Otho Bellows on: 03/16/2014 09:47 AM   Modules accepted: Level of Service

## 2014-03-19 ENCOUNTER — Telehealth: Payer: Self-pay | Admitting: *Deleted

## 2014-03-19 ENCOUNTER — Encounter: Payer: Self-pay | Admitting: *Deleted

## 2014-03-19 NOTE — Telephone Encounter (Signed)
Pt called wants results of lab work from last office visit. Pt is aware Dr Eulas Post not in clinic and will send message. Hilda Blades Anglea Gordner RN 03/19/14 2PM

## 2014-03-23 MED ORDER — ALLOPURINOL 100 MG PO TABS: 100.0000 mg | ORAL_TABLET | Freq: Every day | ORAL | Status: DC

## 2014-03-23 NOTE — Telephone Encounter (Signed)
I have been unable to get in touch with [name] at a reasonable hour while I have been on [employer]. Her urinc acid level was elevated at 9.2, but her renal function was stable at Cr 2.13. I discussed with her about starting allopurinol as a gout prophylaxis pending her lab results. I have sent in a prescription to her pharmacy for allopurinol 100mg  daily. Will you please call the patient to let her know of her lab results and that the prescription is waiting for her at her pharmacy, please? Also, she will need to f/u in clinic in about 1 month.   Thanks!  Curt Bears

## 2014-03-23 NOTE — Addendum Note (Signed)
Addended by: Clayburn Pert F on: 03/23/2014 11:08 PM   Modules accepted: Orders

## 2014-04-03 LAB — FUNGUS CULTURE W SMEAR: Fungal Smear: NONE SEEN

## 2014-04-05 ENCOUNTER — Other Ambulatory Visit: Payer: Self-pay | Admitting: Internal Medicine

## 2014-04-05 NOTE — Telephone Encounter (Signed)
Talked with pt - will call and sch an appt before allopurinol has to be refilled. Elbow feeling much better.

## 2014-04-18 ENCOUNTER — Other Ambulatory Visit: Payer: Self-pay | Admitting: Internal Medicine

## 2014-04-22 LAB — AFB CULTURE WITH SMEAR (NOT AT ARMC): Acid Fast Smear: NONE SEEN

## 2014-05-05 ENCOUNTER — Telehealth: Payer: Self-pay | Admitting: Pulmonary Disease

## 2014-05-05 NOTE — Telephone Encounter (Signed)
Call to patient to confirm appointment for 05/06/14 at 8:15lmtcb

## 2014-05-06 ENCOUNTER — Encounter: Payer: Self-pay | Admitting: Pulmonary Disease

## 2014-05-06 ENCOUNTER — Ambulatory Visit (INDEPENDENT_AMBULATORY_CARE_PROVIDER_SITE_OTHER): Payer: Medicare Other | Admitting: Pulmonary Disease

## 2014-05-06 VITALS — BP 118/65 | HR 57 | Temp 98.5°F | Ht 62.0 in | Wt 164.2 lb

## 2014-05-06 DIAGNOSIS — I10 Essential (primary) hypertension: Secondary | ICD-10-CM | POA: Diagnosis not present

## 2014-05-06 DIAGNOSIS — M10022 Idiopathic gout, left elbow: Secondary | ICD-10-CM | POA: Diagnosis not present

## 2014-05-06 DIAGNOSIS — Z79899 Other long term (current) drug therapy: Secondary | ICD-10-CM

## 2014-05-06 LAB — BASIC METABOLIC PANEL WITH GFR
BUN: 39 mg/dL — ABNORMAL HIGH (ref 6–23)
CO2: 22 mEq/L (ref 19–32)
Calcium: 8.5 mg/dL (ref 8.4–10.5)
Chloride: 107 mEq/L (ref 96–112)
Creat: 2.5 mg/dL — ABNORMAL HIGH (ref 0.50–1.10)
GFR, Est African American: 21 mL/min — ABNORMAL LOW
GFR, Est Non African American: 18 mL/min — ABNORMAL LOW
Glucose, Bld: 69 mg/dL — ABNORMAL LOW (ref 70–99)
Potassium: 4 mEq/L (ref 3.5–5.3)
Sodium: 142 mEq/L (ref 135–145)

## 2014-05-06 LAB — URIC ACID: Uric Acid, Serum: 6.1 mg/dL (ref 2.4–7.0)

## 2014-05-06 NOTE — Assessment & Plan Note (Addendum)
Assessment: No acute flares.  Plan: -Check BMP and Uric acid level.  -Continue colchicine 0.3mg  daily for total 6 month course (end 09/03/2014) -Continue allopurinol 100mg  daily.  -If serum uric acid remains greater than 6, will uptitrate the dose of allopurinol in 50 mg increments in patients with CKD stage 4. -Follow up in 1-3 months.  Addendum: Uric acid level 6.1. Will keep patient on current dose of allopurinol.

## 2014-05-06 NOTE — Progress Notes (Signed)
Medicine attending: Medical history, presenting problems, physical findings, and medications, reviewed with Dr Jennifer Krall and I concur with her evaluation and management plan. 

## 2014-05-06 NOTE — Progress Notes (Signed)
Subjective:   Patient ID: [name], female    DOB: 06-Nov-1934, 80 y.o.   MRN: [id number]  HPI [name] is a 80 year old woman with history of HTN, GERD, CKD4, gout presenting for follow up.  She was last seen in clinic 03/16/2014. She was on colchicine started during her hospitalization 03/03/2014. Her uric acid level was elevated at 9.2, but her renal function was stable at Cr 2.13. She was started on allopurinol 100mg  daily as prophylaxis for gout. She is to continue colchicine for 6 months (end 09/03/2014) and titrate allopurinol to achieve serum uric acid <6.  Today she reports she is doing well and has not had any flares.  Review of Systems Constitutional: no fevers/chills Eyes: no vision changes Ears, nose, mouth, throat, and face: no cough Respiratory: no shortness of breath Cardiovascular: no chest pain Gastrointestinal: no nausea/vomiting, no abdominal pain, no constipation, no diarrhea Genitourinary: no dysuria, no hematuria Integument: no rash Hematologic/lymphatic: no bleeding/bruising, no edema Musculoskeletal: +chronic hand pain, no myalgias Neurological: no paresthesias, no weakness  Past Medical History  Diagnosis Date  . Depression   . Hypertension   . Gastritis, Helicobacter pylori     Noted on EGD 01/18/2009 by [name];  biopsy showed chronic gastritis with Helicobacter pylori, treated by [name].  . Pericardial effusion 11/2004    S/P subxiphoid pericardial window 11/01/2004 by [name].  A question of possible collagen vascular disease had been raised in 2006 due to arthralgias, a history of idiopathic pericarditis, and increased pigmentation of her palms, and she was referred to [name] but he was unable to confirm a rheumatologic diagnosis.  [name] Hemorrhoids, internal 05/09/2005     Found on colonoscopy 05/09/2005 by [name]  . Cataracts, bilateral   . Pinguecula   . Vitreous detachment     "no OR"  . Thigh  pain   . Anemia, iron deficiency   . Angiodysplasia of colon 05/09/2005    Single small angiodysplastic lesion in the descending colon found on colonoscopy 05/09/2005 by [name]  . Anemia due to blood loss, chronic   . Stiffness of joint, not elsewhere classified, hand   . Osteoporosis, unspecified 03/06/2012    A DXA bone density scan done 02/20/2012 showed osteoporosis with a lumbar spine T-score of -4.4 and a right femur T-score of -2.8.  Patient has chronic kidney disease with estimated GFR less than 30, and it is not clear how much of her osteoporosis may be due to renal osteodystrophy.    [name] GERD (gastroesophageal reflux disease)   . Chronic knee pain     bilateral  . Chronic ankle pain     bilateral  . Chronic thumb pain   . Renal failure     Formerly on hemodialysis; managed by Dr. Corliss Parish  . Chronic kidney disease, stage IV (severe)     [name] 02/09/2014  . History of blood transfusion     "low blood count"  . Gout   . Epicondylitis   . Arthritis     "knots on my thumbs; left elbow" (03/03/2014)  . Stroke 2007    "when my kidneys went out & I was in a coma; weak LUE/LLE since"    Current Outpatient Prescriptions on File Prior to Visit  Medication Sig Dispense Refill  . allopurinol (ZYLOPRIM) 100 MG tablet Take 1 tablet (100 mg total) by mouth daily. 30 tablet 1  . amLODipine (NORVASC) 10 MG tablet  TAKE ONE TABLET BY MOUTH AT BEDTIME 60 tablet 3  . atenolol (TENORMIN) 25 MG tablet Take 1 tablet (25 mg total) by mouth at bedtime. 60 tablet 1  . buPROPion (WELLBUTRIN XL) 150 MG 24 hr tablet Take 1 tablet (150 mg total) by mouth daily. 60 tablet 1  . cinacalcet (SENSIPAR) 30 MG tablet Take 1 tablet (30 mg total) by mouth daily. 60 tablet 1  . colchicine 0.6 MG tablet Take 0.5 tablets (0.3 mg total) by mouth daily. 15 tablet 1  . diphenhydramine-acetaminophen (TYLENOL PM) 25-500 MG TABS Take 1 tablet by mouth at bedtime as needed (sleep).    . furosemide (LASIX)  80 MG tablet Take 0.5 tablets (40 mg total) by mouth daily. 30 tablet 1  . meclizine (ANTIVERT) 12.5 MG tablet Take 12.5 mg by mouth daily.     . Multiple Vitamins-Minerals (CENTRUM SILVER ADULT 50+ PO) Take 1 tablet by mouth daily.    [name] omeprazole (PRILOSEC) 20 MG capsule Take 1 capsule (20 mg total) by mouth 2 (two) times daily. 120 capsule 1  . oxyCODONE-acetaminophen (PERCOCET/ROXICET) 5-325 MG per tablet 1 tab PO q12h prn pain (Patient not taking: Reported on 03/16/2014) 10 tablet 0  . traMADol (ULTRAM) 50 MG tablet Take 2 tablets (100 mg total) by mouth every 12 (twelve) hours as needed for severe pain. 100 tablet 0  . Trolamine Salicylate (ASPERCREME EX) Apply 1 application topically daily as needed (hand pain).    . [DISCONTINUED] FLUoxetine (PROZAC) 10 MG tablet Take 1 tablet (10 mg total) by mouth daily. 30 tablet 2   No current facility-administered medications on file prior to visit.    Today's Vitals   05/06/14 0826  BP: 118/65  Pulse: 57  Temp: 98.5 F (36.9 C)  TempSrc: Oral  Height: 5\' 2"  (1.575 m)  Weight: 164 lb 3.2 oz (74.481 kg)  SpO2: 100%   Objective:  Physical Exam  Constitutional: She is oriented to person, place, and time. She appears well-developed and well-nourished. No distress.  HENT:  Head: Normocephalic and atraumatic.  Mouth/Throat: Oropharynx is clear and moist.  Eyes: Conjunctivae are normal.  Neck: Neck supple.  Cardiovascular: Normal rate, regular rhythm and normal heart sounds.   No murmur heard. Pulmonary/Chest: Effort normal and breath sounds normal. No respiratory distress. She has no wheezes. She has no rales.  Abdominal: Soft. Bowel sounds are normal. She exhibits no distension.  Musculoskeletal: Normal range of motion. She exhibits no tenderness.  Lymphadenopathy:    She has no cervical adenopathy.  Neurological: She is alert and oriented to person, place, and time.  Skin: Skin is warm and dry.  Psychiatric: She has a normal mood and  affect.    Assessment & Plan:  Please refer to problem based charting.

## 2014-05-06 NOTE — Patient Instructions (Signed)
General Instructions:   Please bring your medicines with you each time you come to clinic.  Medicines may include prescription medications, over-the-counter medications, herbal remedies, eye drops, vitamins, or other pills.   Progress Toward Treatment Goals:  Treatment Goal 05/06/2014  Blood pressure at goal    Self Care Goals & Plans:  Self Care Goal 05/06/2014  Manage my medications take my medicines as prescribed; bring my medications to every visit; refill my medications on time  Monitor my health -  Eat healthy foods eat more vegetables; eat foods that are low in salt; eat baked foods instead of fried foods  Be physically active find an activity I enjoy; take a walk every day  Meeting treatment goals -

## 2014-05-06 NOTE — Assessment & Plan Note (Signed)
BP Readings from Last 3 Encounters:  05/06/14 118/65  03/16/14 130/80  03/09/14 139/81    Lab Results  Component Value Date   NA 139 03/16/2014   K 5.0 03/16/2014   CREATININE 2.13* 03/16/2014    Assessment: Blood pressure control: controlled Progress toward BP goal:  at goal  Plan: Medications:  continue current medications including amlodipine 10mg  daily, atenolol 25mg  daily, lasix 40mg  daily

## 2014-05-11 ENCOUNTER — Other Ambulatory Visit: Payer: Self-pay | Admitting: Internal Medicine

## 2014-05-22 ENCOUNTER — Other Ambulatory Visit: Payer: Self-pay | Admitting: Internal Medicine

## 2014-05-28 ENCOUNTER — Other Ambulatory Visit: Payer: Self-pay | Admitting: Internal Medicine

## 2014-06-22 ENCOUNTER — Other Ambulatory Visit: Payer: Self-pay | Admitting: Pulmonary Disease

## 2014-07-12 ENCOUNTER — Other Ambulatory Visit: Payer: Self-pay | Admitting: Internal Medicine

## 2014-07-20 ENCOUNTER — Other Ambulatory Visit: Payer: Self-pay | Admitting: Internal Medicine

## 2014-08-11 ENCOUNTER — Other Ambulatory Visit: Payer: Self-pay | Admitting: Pulmonary Disease

## 2014-08-11 DIAGNOSIS — Z1239 Encounter for other screening for malignant neoplasm of breast: Secondary | ICD-10-CM

## 2014-08-20 ENCOUNTER — Ambulatory Visit
Admission: RE | Admit: 2014-08-20 | Discharge: 2014-08-20 | Disposition: A | Discharge status: Home / Self Care | Payer: Medicare Other | Source: Ambulatory Visit | Attending: Oncology | Admitting: Oncology

## 2014-08-20 DIAGNOSIS — Z1239 Encounter for other screening for malignant neoplasm of breast: Secondary | ICD-10-CM

## 2014-08-24 ENCOUNTER — Ambulatory Visit (INDEPENDENT_AMBULATORY_CARE_PROVIDER_SITE_OTHER): Payer: Medicare Other | Admitting: Internal Medicine

## 2014-08-24 ENCOUNTER — Ambulatory Visit (HOSPITAL_COMMUNITY)
Admission: RE | Admit: 2014-08-24 | Discharge: 2014-08-24 | Disposition: A | Discharge status: Home / Self Care | Payer: Medicare Other | Source: Ambulatory Visit | Attending: Internal Medicine | Admitting: Internal Medicine

## 2014-08-24 VITALS — BP 139/64 | HR 67 | Temp 98.2°F | Ht 62.0 in | Wt 168.4 lb

## 2014-08-24 DIAGNOSIS — M79645 Pain in left finger(s): Secondary | ICD-10-CM | POA: Diagnosis not present

## 2014-08-24 DIAGNOSIS — M25532 Pain in left wrist: Secondary | ICD-10-CM | POA: Diagnosis not present

## 2014-08-24 MED ORDER — DICLOFENAC SODIUM 1 % TD GEL: 2.0000 g | Freq: Four times a day (QID) | TRANSDERMAL | Status: DC

## 2014-08-24 NOTE — Patient Instructions (Signed)
Thank you for coming in today.   - I have given you a prescription for Voltaren gel today. Apply it to the area 4 times a day.  - We will get an Xray of your wrist today. I will call you and let you know if there are any problems.  - If you continue to have pain for 1-2 weeks, call the clinic and schedule an appointment. We will get you a referral to a hand surgeon at that time.

## 2014-08-24 NOTE — Progress Notes (Signed)
Patient ID: [name], female   DOB: 1934-03-22, 80 y.o.   MRN: [id number]   Subjective:   Patient ID: [name] female   DOB: Sep 22, 1934 80 y.o.   MRN: [id number]  HPI: [name] is a 80 y.o. female with a PMH of HTN, GERD, CKD4, gout presents to clinic with complaints of several month history of worsening left thumb pain.   For details of today's visit please refer to problem based charting.   Past Medical History  Diagnosis Date  . Depression   . Hypertension   . Gastritis, Helicobacter pylori     Noted on EGD 01/18/2009 by [name];  biopsy showed chronic gastritis with Helicobacter pylori, treated by [name].  . Pericardial effusion 11/2004    S/P subxiphoid pericardial window 11/01/2004 by [name].  A question of possible collagen vascular disease had been raised in 2006 due to arthralgias, a history of idiopathic pericarditis, and increased pigmentation of her palms, and she was referred to [name] but he was unable to confirm a rheumatologic diagnosis.  [name] Hemorrhoids, internal 05/09/2005     Found on colonoscopy 05/09/2005 by [name]  . Cataracts, bilateral   . Pinguecula   . Vitreous detachment     "no OR"  . Thigh pain   . Anemia, iron deficiency   . Angiodysplasia of colon 05/09/2005    Single small angiodysplastic lesion in the descending colon found on colonoscopy 05/09/2005 by [name]  . Anemia due to blood loss, chronic   . Stiffness of joint, not elsewhere classified, hand   . Osteoporosis, unspecified 03/06/2012    A DXA bone density scan done 02/20/2012 showed osteoporosis with a lumbar spine T-score of -4.4 and a right femur T-score of -2.8.  Patient has chronic kidney disease with estimated GFR less than 30, and it is not clear how much of her osteoporosis may be due to renal osteodystrophy.    [name] GERD (gastroesophageal reflux disease)   . Chronic knee pain     bilateral  . Chronic ankle pain    bilateral  . Chronic thumb pain   . Renal failure     Formerly on hemodialysis; managed by Dr. Corliss Parish  . Chronic kidney disease, stage IV (severe)     [name] 02/09/2014  . History of blood transfusion     "low blood count"  . Gout   . Epicondylitis   . Arthritis     "knots on my thumbs; left elbow" (03/03/2014)  . Stroke 2007    "when my kidneys went out & I was in a coma; weak LUE/LLE since"   Current Outpatient Prescriptions  Medication Sig Dispense Refill  . allopurinol (ZYLOPRIM) 100 MG tablet TAKE ONE TABLET BY MOUTH ONCE DAILY 30 tablet 3  . amLODipine (NORVASC) 10 MG tablet TAKE ONE TABLET BY MOUTH AT BEDTIME 60 tablet 3  . atenolol (TENORMIN) 25 MG tablet TAKE ONE TABLET BY MOUTH AT BEDTIME 60 tablet 3  . buPROPion (WELLBUTRIN XL) 150 MG 24 hr tablet TAKE ONE TABLET BY MOUTH ONCE DAILY 60 tablet 3  . cinacalcet (SENSIPAR) 30 MG tablet Take 1 tablet (30 mg total) by mouth daily. 60 tablet 1  . colchicine 0.6 MG tablet TAKE ONE-HALF TABLET BY MOUTH  DAILY 15 tablet 2  . diclofenac sodium (VOLTAREN) 1 % GEL Apply 2 g topically 4 (four) times daily. 1 Tube 3  . diphenhydramine-acetaminophen (TYLENOL PM) 25-500 MG TABS  Take 1 tablet by mouth at bedtime as needed (sleep).    . furosemide (LASIX) 80 MG tablet TAKE ONE-HALF TABLET BY MOUTH ONCE DAILY 30 tablet 2  . meclizine (ANTIVERT) 12.5 MG tablet Take 12.5 mg by mouth daily.     . Multiple Vitamins-Minerals (CENTRUM SILVER ADULT 50+ PO) Take 1 tablet by mouth daily.    [name] omeprazole (PRILOSEC) 20 MG capsule TAKE ONE CAPSULE BY MOUTH TWICE DAILY 120 capsule 2  . Trolamine Salicylate (ASPERCREME EX) Apply 1 application topically daily as needed (hand pain).    . [DISCONTINUED] FLUoxetine (PROZAC) 10 MG tablet Take 1 tablet (10 mg total) by mouth daily. 30 tablet 2   No current facility-administered medications for this visit.   Family History  Problem Relation Age of Onset  . Hypertension Brother   . Heart disease  Father   . Diabetes Brother   . Breast cancer Neg Hx   . Colon cancer Neg Hx   . Lung cancer Neg Hx   . Cervical cancer Neg Hx   . Sickle cell anemia Neg Hx    Social History   Social History  . Marital Status: Legally Separated    Spouse Name: N/A  . Number of Children: N/A  . Years of Education: N/A   Social History Main Topics  . Smoking status: Former Smoker -- 0.10 packs/day for 1 years    Types: Cigarettes    Quit date: 01/02/1960  . Smokeless tobacco: Never Used  . Alcohol Use: No  . Drug Use: No  . Sexual Activity: No   Other Topics Concern  . Not on file   Social History Narrative   Review of Systems: Review of Systems  Constitutional: Negative for fever and chills.  Musculoskeletal: Positive for joint pain. Negative for myalgias.  Skin: Negative for rash.  Neurological: Negative for tingling, sensory change, focal weakness and weakness.   Objective:  Physical Exam: Filed Vitals:   08/24/14 1417  BP: 139/64  Pulse: 67  Temp: 98.2 F (36.8 C)  TempSrc: Oral  Height: 5\' 2"  (1.575 m)  Weight: 168 lb 6.4 oz (76.386 kg)  SpO2: 100%   GENERAL- alert, co-operative, appears as stated age, not in any distress. HEENT- Atraumatic, normocephalic, PERRL, EOMI, oral mucosa appears moist. CARDIAC- RRR, no murmurs, rubs or gallops. RESP- Moving equal volumes of air, and clear to auscultation bilaterally, no wheezes or crackles. ABDOMEN- Soft, nontender, no guarding or rebound, no palpable masses or organomegaly, bowel sounds present. NEURO- No obvious Cr N abnormality MSK- left hand with no obvious abnormality. 5/5 strength in digits 2-5 and normal ROM. Pain with movement of 1st digit and decreased ROM. Pain localized in the thenar area. Full ROM at the wrist. Negative Tinel and Phalen. No erythema, edema or warmth.  EXTREMITIES- pulse 2+, symmetric, no pedal edema. SKIN- Warm, dry, No rash or lesion. PSYCH- Normal mood and affect, appropriate thought content and  speech.  Assessment & Plan:   Case discussed with Dr. Dareen Piano. Please refer to Problem based carting for further details of today's visit.

## 2014-08-24 NOTE — Assessment & Plan Note (Addendum)
Patient reports worsening of her left thumb pain for the last 2 months. She reports pain in the thenar area of her left hand. Describes it as a sharp, shooting pain starting at the base of her hand and shooting down her thumb lasting only a few seconds. Pain is exacerbated with movement of her thumb but can occur without any trigger. Denies any trauma to the area, fevers chills, erythema, warmth or edema. She has been wearing a wrist splint which helps limit her movement but she has not seen any change in symptoms.   On exam today she has 5/5 strength in digits 2-5 and normal ROM. Pain with movement of 1st digit and decreased ROM. Pain localized in the thenar area. Full ROM at the wrist. Negative Tinel and Phalen. No erythema, edema or warmth.   She was previously seen on 11/03/13 for the same pain. Xrays of her hand were negative for fracture or subluxation and only showed degenerative changes first carpometacarpal joint. She was referred to sports medicine. Believed it to be [facility] joint arthritis and discussed joint injections which she declined. She states today that she would not want any injections done at any point.    de Quervain tendinopathy vs OA  - Will get repeat Xray today to assess for any acute changes  - Voltaren gel prn - If symptoms do not resolve and Xray does not reveal anything, next step would be referral to hand orthopedic surgery   ADDENDUM:  IMPRESSION: No acute bony or joint abnormality. Prominent radiocarpal and first carpometacarpal degenerative changes present. The prominent degenerative changes noted at the base of the left thumb may account for the patient's symptoms.  ADDENDUM: Patient called in to clinic today on 8/29 with concerns about the Voltaren gel. States that she was told by her Kidney doctor to not take anything that could potentially adversely affect her Kidneys and is concerned about using the Voltaren gel. I explained to her that the risk was low as there  is little systemic uptake of the gel but she says she is unwilling to try anything that has any risk to her Kidneys. Will put in a referral to Orthopedic Surgery today for further evaluation.

## 2014-08-26 NOTE — Progress Notes (Signed)
Internal Medicine Clinic Attending  I saw and evaluated the patient.  I personally confirmed the key portions of the history and exam documented by Dr. Boswell and I reviewed pertinent patient test results.  The assessment, diagnosis, and plan were formulated together and I agree with the documentation in the resident's note. 

## 2014-08-30 ENCOUNTER — Other Ambulatory Visit: Payer: Self-pay | Admitting: Pulmonary Disease

## 2014-08-30 NOTE — Telephone Encounter (Signed)
Called pt back, explained that this is not something to take orally that she is to rub it into the skin, she states she knows that but that the side effects are kidney failure and therefore she should not use it and "he has done made me spend my money on something that will kill me", i tried to explain that the gel is the least harmful and it would take gallons of the gel most probably to cause the renal failure, she stated that was not what the papers said, she would like the doctor to call her back. Sending this to dr's narendra, boswell, kim

## 2014-08-30 NOTE — Telephone Encounter (Signed)
Dr. Charlynn Grimes discussed case with patient. We will hold of on voltaren gel for now and refer her to a hand surgeon if pain persists

## 2014-08-30 NOTE — Telephone Encounter (Signed)
Pt stated that she can not take diclofenac sodium. Please call pt back.

## 2014-08-30 NOTE — Telephone Encounter (Signed)
Spoke to patient about her concerns with the diclofenac gel and informed her that there is minimal systemic absorption. Patient is anxious about the potential to affect her renal function so I offered other modalities including acetaminophen, ice, and physical therapy, and that she could use the diclofenac gel sparingly if needed. Patient agreed to try a multi-modal approach and was advised to contact clinic if further concerns arise.

## 2014-08-30 NOTE — Addendum Note (Signed)
Addended by: Atwater Lions on: 08/30/2014 03:33 PM   Modules accepted: Orders

## 2014-08-31 MED ORDER — COLCHICINE 0.6 MG PO TABS: 0.3000 mg | ORAL_TABLET | Freq: Every day | ORAL | Status: DC

## 2014-10-08 ENCOUNTER — Encounter (HOSPITAL_COMMUNITY): Payer: Self-pay | Admitting: Emergency Medicine

## 2014-10-08 ENCOUNTER — Emergency Department (HOSPITAL_COMMUNITY)
Admission: EM | Admit: 2014-10-08 | Discharge: 2014-10-09 | Disposition: A | Discharge status: Home / Self Care | Payer: Medicare Other | Attending: Emergency Medicine | Admitting: Emergency Medicine

## 2014-10-08 DIAGNOSIS — I129 Hypertensive chronic kidney disease with stage 1 through stage 4 chronic kidney disease, or unspecified chronic kidney disease: Secondary | ICD-10-CM | POA: Insufficient documentation

## 2014-10-08 DIAGNOSIS — Z862 Personal history of diseases of the blood and blood-forming organs and certain disorders involving the immune mechanism: Secondary | ICD-10-CM | POA: Insufficient documentation

## 2014-10-08 DIAGNOSIS — M109 Gout, unspecified: Secondary | ICD-10-CM | POA: Insufficient documentation

## 2014-10-08 DIAGNOSIS — Z8679 Personal history of other diseases of the circulatory system: Secondary | ICD-10-CM | POA: Insufficient documentation

## 2014-10-08 DIAGNOSIS — Z79899 Other long term (current) drug therapy: Secondary | ICD-10-CM | POA: Insufficient documentation

## 2014-10-08 DIAGNOSIS — Z8673 Personal history of transient ischemic attack (TIA), and cerebral infarction without residual deficits: Secondary | ICD-10-CM | POA: Diagnosis not present

## 2014-10-08 DIAGNOSIS — Z9889 Other specified postprocedural states: Secondary | ICD-10-CM | POA: Diagnosis not present

## 2014-10-08 DIAGNOSIS — M1990 Unspecified osteoarthritis, unspecified site: Secondary | ICD-10-CM | POA: Insufficient documentation

## 2014-10-08 DIAGNOSIS — G8929 Other chronic pain: Secondary | ICD-10-CM | POA: Insufficient documentation

## 2014-10-08 DIAGNOSIS — Z8669 Personal history of other diseases of the nervous system and sense organs: Secondary | ICD-10-CM | POA: Insufficient documentation

## 2014-10-08 DIAGNOSIS — Z791 Long term (current) use of non-steroidal anti-inflammatories (NSAID): Secondary | ICD-10-CM | POA: Diagnosis not present

## 2014-10-08 DIAGNOSIS — R112 Nausea with vomiting, unspecified: Secondary | ICD-10-CM | POA: Insufficient documentation

## 2014-10-08 DIAGNOSIS — K219 Gastro-esophageal reflux disease without esophagitis: Secondary | ICD-10-CM | POA: Insufficient documentation

## 2014-10-08 DIAGNOSIS — N184 Chronic kidney disease, stage 4 (severe): Secondary | ICD-10-CM | POA: Diagnosis not present

## 2014-10-08 DIAGNOSIS — K2970 Gastritis, unspecified, without bleeding: Secondary | ICD-10-CM | POA: Insufficient documentation

## 2014-10-08 DIAGNOSIS — G8918 Other acute postprocedural pain: Secondary | ICD-10-CM | POA: Diagnosis not present

## 2014-10-08 DIAGNOSIS — F329 Major depressive disorder, single episode, unspecified: Secondary | ICD-10-CM | POA: Diagnosis not present

## 2014-10-08 DIAGNOSIS — Z87891 Personal history of nicotine dependence: Secondary | ICD-10-CM | POA: Insufficient documentation

## 2014-10-08 MED ORDER — HYDROMORPHONE HCL 1 MG/ML IJ SOLN
1.0000 mg | Freq: Once | INTRAMUSCULAR | Status: AC
  Administered 2014-10-08: 1 mg via INTRAVENOUS
  Filled 2014-10-08: qty 1

## 2014-10-08 MED ORDER — ONDANSETRON HCL 4 MG/2ML IJ SOLN
4.0000 mg | Freq: Once | INTRAMUSCULAR | Status: AC
  Administered 2014-10-08: 4 mg via INTRAVENOUS
  Filled 2014-10-08: qty 2

## 2014-10-08 NOTE — ED Notes (Signed)
Dr. Roxanne Mins at the bedside.

## 2014-10-08 NOTE — ED Notes (Signed)
Patient not in room yet 

## 2014-10-08 NOTE — ED Notes (Signed)
Pt. reports post surgery pain at left hand at [facility] onset today unrelieved by prescription Percocet.

## 2014-10-08 NOTE — ED Provider Notes (Signed)
CSN: [id number]     Arrival date & time 10/08/14  2258 History  By signing my name below, I, [name], attest that this documentation has been prepared under the direction and in the presence of Delora Fuel, MD. Electronically Signed: Hansel Cole, ED Scribe. 10/08/2014. 11:26 PM.    Chief Complaint  Patient presents with  . Post-op Problem   The history is provided by the patient. No language interpreter was used.   HPI Comments: [name] is a 80 y.o. female s/p surgery on the left hand earlier this morning at [facility] who presents to the Emergency Department complaining of moderate pain to the left hand onset 8 hours ago at [facility] with associated nausea. Pt notes that once the numbness wore off from her procedure, she began to feel the pain. Pt notes that she has been keeping her hand elevated, but has not tried ice. She states no relief with 2 Percocet, taken at 1500. She states she took another Percocet 1900 and had an episode of emesis after this dose. Her surgery was done by [name] with [facility].   Past Medical History  Diagnosis Date  . Depression   . Hypertension   . Gastritis, Helicobacter pylori     Noted on EGD 01/18/2009 by [name];  biopsy showed chronic gastritis with Helicobacter pylori, treated by [name].  . Pericardial effusion 11/2004    S/P subxiphoid pericardial window 11/01/2004 by [name].  A question of possible collagen vascular disease had been raised in 2006 due to arthralgias, a history of idiopathic pericarditis, and increased pigmentation of her palms, and she was referred to [name] but he was unable to confirm a rheumatologic diagnosis.  [name] Hemorrhoids, internal 05/09/2005     Found on colonoscopy 05/09/2005 by [name]  . Cataracts, bilateral   . Pinguecula   . Vitreous detachment     "no OR"  . Thigh pain   . Anemia, iron deficiency   . Angiodysplasia of colon 05/09/2005    Single small  angiodysplastic lesion in the descending colon found on colonoscopy 05/09/2005 by [name]  . Anemia due to blood loss, chronic   . Stiffness of joint, not elsewhere classified, hand   . Osteoporosis, unspecified 03/06/2012    A DXA bone density scan done 02/20/2012 showed osteoporosis with a lumbar spine T-score of -4.4 and a right femur T-score of -2.8.  Patient has chronic kidney disease with estimated GFR less than 30, and it is not clear how much of her osteoporosis may be due to renal osteodystrophy.    [name] GERD (gastroesophageal reflux disease)   . Chronic knee pain     bilateral  . Chronic ankle pain     bilateral  . Chronic thumb pain   . Renal failure     Formerly on hemodialysis; managed by Dr. Corliss Parish  . Chronic kidney disease, stage IV (severe) ([place])     [name] 02/09/2014  . History of blood transfusion     "low blood count"  . Gout   . Epicondylitis   . Arthritis     "knots on my thumbs; left elbow" (03/03/2014)  . Stroke [facility]) 2007    "when my kidneys went out & I was in a coma; weak LUE/LLE since"   Past Surgical History  Procedure Laterality Date  . Subxyphoid pericardial window  11/01/2004  . Arteriovenous graft placement Left 11/28/2005    forearm arteriovenous  .  Thrombectomy / arteriovenous graft revision Left 01/21/2006    Thrombectomy and revision of left forearm loop AV graft.  . Thrombectomy Left  03/06/2006    Simple thrombectomy arteriovenous Gore-Tex graft, left arm, with exploration of venous end and intraoperative shuntogram.         . Hallux valgus correction Left 01/2012    foot  . Abdominal hysterectomy  1960's  . Tubal ligation  1960's    "before hysterectomy"  . Cardiac catheterization  01/2005    [name] 05/15/2010   Family History  Problem Relation Age of Onset  . Hypertension Brother   . Heart disease Father   . Diabetes Brother   . Breast cancer Neg Hx   . Colon cancer Neg Hx   . Lung cancer Neg Hx   . Cervical cancer Neg  Hx   . Sickle cell anemia Neg Hx    Social History  Substance Use Topics  . Smoking status: Former Smoker -- 0.10 packs/day for 1 years    Types: Cigarettes    Quit date: 01/02/1960  . Smokeless tobacco: Never Used  . Alcohol Use: No   OB History    No data available     Review of Systems  Gastrointestinal: Positive for nausea and vomiting.  Musculoskeletal: Positive for arthralgias.  All other systems reviewed and are negative.  Allergies  Ibuprofen  Home Medications   Prior to Admission medications   Medication Sig Start Date End Date Taking? Authorizing Provider  allopurinol (ZYLOPRIM) 100 MG tablet TAKE ONE TABLET BY MOUTH ONCE DAILY 06/23/14   [name], MD  amLODipine (NORVASC) 10 MG tablet TAKE ONE TABLET BY MOUTH AT BEDTIME 04/19/14   [name], MD  atenolol (TENORMIN) 25 MG tablet TAKE ONE TABLET BY MOUTH AT BEDTIME 07/22/14   [name], MD  buPROPion (WELLBUTRIN XL) 150 MG 24 hr tablet TAKE ONE TABLET BY MOUTH ONCE DAILY 05/25/14   [name], MD  cinacalcet (SENSIPAR) 30 MG tablet Take 1 tablet (30 mg total) by mouth daily. 03/04/14   [name], MD  colchicine 0.6 MG tablet Take 0.5 tablets (0.3 mg total) by mouth daily. 08/31/14   [name], MD  diclofenac sodium (VOLTAREN) 1 % GEL Apply 2 g topically 4 (four) times daily. 08/24/14   [name], MD  diphenhydramine-acetaminophen (TYLENOL PM) 25-500 MG TABS Take 1 tablet by mouth at bedtime as needed (sleep).    Historical Provider, MD  furosemide (LASIX) 80 MG tablet TAKE ONE-HALF TABLET BY MOUTH ONCE DAILY 07/14/14   [name], MD  meclizine (ANTIVERT) 12.5 MG tablet Take 12.5 mg by mouth daily.     Historical Provider, MD  Multiple Vitamins-Minerals (CENTRUM SILVER ADULT 50+ PO) Take 1 tablet by mouth daily.    Historical Provider, MD  omeprazole (PRILOSEC) 20 MG capsule TAKE ONE CAPSULE BY MOUTH TWICE DAILY 07/14/14   [name], MD  Trolamine Salicylate (ASPERCREME EX)  Apply 1 application topically daily as needed (hand pain).    Historical Provider, MD   BP 158/79 mmHg  Pulse 85  Temp(Src) 98.5 F (36.9 C) (Oral)  Resp 18  SpO2 95% Physical Exam  Constitutional: She is oriented to person, place, and time. She appears well-developed and well-nourished.  HENT:  Head: Normocephalic and atraumatic.  Eyes: Conjunctivae and EOM are normal. Pupils are equal, round, and reactive to light.  Neck: Normal range of motion. Neck supple. No JVD present.  Cardiovascular: Normal rate, regular rhythm and  normal heart sounds.   No murmur heard. Pulmonary/Chest: Effort normal and breath sounds normal. She has no wheezes. She has no rales. She exhibits no tenderness.  Abdominal: Soft. Bowel sounds are normal. She exhibits no distension and no mass. There is no tenderness.  Musculoskeletal: Normal range of motion. She exhibits no edema.  Left hand in a thumb spica splint. Cap refill is prompt and sensation is normal.   Lymphadenopathy:    She has no cervical adenopathy.  Neurological: She is alert and oriented to person, place, and time. No cranial nerve deficit. She exhibits normal muscle tone. Coordination normal.  Skin: Skin is warm and dry. No rash noted.  Psychiatric: She has a normal mood and affect. Her behavior is normal. Judgment and thought content normal.  Nursing note and vitals reviewed.  ED Course  Procedures (including critical care time) DIAGNOSTIC STUDIES: Oxygen Saturation is 95% on RA, adequate by my interpretation.    COORDINATION OF CARE: 11:23 PM Discussed treatment plan with pt at bedside and pt agreed to plan.   MDM   Final diagnoses:  Post-operative pain    Postoperative pain. Old records are reviewed and she had been referred from internal medicine clinic to hand specialist but hand surgery note and operative notes are not in her system. No evidence of any acute complication other than expected pain when block were off. IV is started  and she is given intravenous hydromorphone and also given ondansetron for nausea. When I am able to identify how much analgesia she needs, she will be sent home with appropriate prescriptions.  She had good relief of pain with single dose of hydromorphone and she is discharged with prescription for hydromorphone.  I, Jimmylee Ratterree, personally performed the services described in this documentation. All medical record entries made by the scribe were at my direction and in my presence.  I have reviewed the chart and discharge instructions and agree that the record reflects my personal performance and is accurate and complete. Shreena Baines.  10/08/2014. 11:46 PM.      Delora Fuel, MD [id number]

## 2014-10-09 MED ORDER — HYDROMORPHONE HCL 2 MG PO TABS
2.0000 mg | ORAL_TABLET | ORAL | Status: DC | PRN
Start: 2014-10-09 — End: 2015-03-01

## 2014-10-09 NOTE — Discharge Instructions (Signed)
Take your oxycodone-acetaminophen as needed for less severe pain. Take the hydromorphone as needed for more severe pain. Follow-up with your hand surgeon as scheduled.

## 2014-10-09 NOTE — ED Notes (Signed)
Dr. Roxanne Mins is at the bedside.

## 2014-11-03 ENCOUNTER — Other Ambulatory Visit: Payer: Self-pay | Admitting: Pulmonary Disease

## 2014-11-17 ENCOUNTER — Other Ambulatory Visit: Payer: Self-pay | Admitting: Internal Medicine

## 2014-12-06 ENCOUNTER — Other Ambulatory Visit: Payer: Self-pay | Admitting: Pulmonary Disease

## 2014-12-08 ENCOUNTER — Other Ambulatory Visit: Payer: Self-pay | Admitting: Orthopedic Surgery

## 2014-12-08 DIAGNOSIS — M25511 Pain in right shoulder: Secondary | ICD-10-CM

## 2014-12-15 ENCOUNTER — Other Ambulatory Visit: Payer: Self-pay | Admitting: Internal Medicine

## 2014-12-15 NOTE — Telephone Encounter (Signed)
Has Feb appt with new PCP

## 2014-12-16 ENCOUNTER — Ambulatory Visit
Admission: RE | Admit: 2014-12-16 | Discharge: 2014-12-16 | Disposition: A | Discharge status: Home / Self Care | Payer: Medicare Other | Source: Ambulatory Visit | Attending: Orthopedic Surgery | Admitting: Orthopedic Surgery

## 2014-12-16 DIAGNOSIS — M25511 Pain in right shoulder: Secondary | ICD-10-CM

## 2015-01-20 ENCOUNTER — Other Ambulatory Visit: Payer: Self-pay | Admitting: Internal Medicine

## 2015-01-24 ENCOUNTER — Encounter: Payer: Self-pay | Admitting: Internal Medicine

## 2015-01-24 ENCOUNTER — Ambulatory Visit (INDEPENDENT_AMBULATORY_CARE_PROVIDER_SITE_OTHER): Payer: Medicare Other | Admitting: Internal Medicine

## 2015-01-24 VITALS — BP 165/84 | HR 68 | Temp 98.0°F | Wt 170.0 lb

## 2015-01-24 DIAGNOSIS — H9312 Tinnitus, left ear: Secondary | ICD-10-CM

## 2015-01-24 DIAGNOSIS — I69354 Hemiplegia and hemiparesis following cerebral infarction affecting left non-dominant side: Secondary | ICD-10-CM

## 2015-01-24 DIAGNOSIS — Z8673 Personal history of transient ischemic attack (TIA), and cerebral infarction without residual deficits: Secondary | ICD-10-CM | POA: Insufficient documentation

## 2015-01-24 DIAGNOSIS — M10022 Idiopathic gout, left elbow: Secondary | ICD-10-CM

## 2015-01-24 DIAGNOSIS — H9319 Tinnitus, unspecified ear: Secondary | ICD-10-CM | POA: Insufficient documentation

## 2015-01-24 MED ORDER — ATORVASTATIN CALCIUM 40 MG PO TABS: 40.0000 mg | ORAL_TABLET | Freq: Every day | ORAL | Status: DC

## 2015-01-24 MED ORDER — ASPIRIN EC 81 MG PO TBEC: 81.0000 mg | DELAYED_RELEASE_TABLET | Freq: Every day | ORAL | Status: AC

## 2015-01-24 MED ORDER — COLCHICINE 0.6 MG PO TABS: 0.3000 mg | ORAL_TABLET | Freq: Every day | ORAL | Status: DC

## 2015-01-24 NOTE — Progress Notes (Signed)
Patient ID: [name], female   DOB: 06/07/1934, 81 y.o.   MRN: [id number]    Subjective:   Patient ID: [name] female   DOB: 1934-04-28 81 y.o.   MRN: [id number]  HPI: [name] is a 81 y.o. female with PMH as below, here for 3 weeks of left ear ringing.  Please see Problem-Based charting for the status of the patient's chronic medical issues.  She reports 3 weeks of constant, low pitched, "snoring," ringing in her ear.  She has hearing loss at baseline, wearing hearing aids. However, she states her left hearing aid is not working as well recently.  She denies vertigo, but she is taking Meclizine for an unknown reason.   She endorses intermittent dizziness when standing quickly.   She has a h/o CVA in 2007, with residual LLE weakness unchanged from prior.  She is not ASA or statin because no one ever told her take it.  She denies new deficits other than ear ringing.   Past Medical History  Diagnosis Date  . Depression   . Hypertension   . Gastritis, Helicobacter pylori     Noted on EGD 01/18/2009 by [name];  biopsy showed chronic gastritis with Helicobacter pylori, treated by [name].  . Pericardial effusion 11/2004    S/P subxiphoid pericardial window 11/01/2004 by [name].  A question of possible collagen vascular disease had been raised in 2006 due to arthralgias, a history of idiopathic pericarditis, and increased pigmentation of her palms, and she was referred to [name] but he was unable to confirm a rheumatologic diagnosis.  [name] Hemorrhoids, internal 05/09/2005     Found on colonoscopy 05/09/2005 by [name]  . Cataracts, bilateral   . Pinguecula   . Vitreous detachment     "no OR"  . Thigh pain   . Anemia, iron deficiency   . Angiodysplasia of colon 05/09/2005    Single small angiodysplastic lesion in the descending colon found on colonoscopy 05/09/2005 by [name]  . Anemia due to blood loss, chronic   .  Stiffness of joint, not elsewhere classified, hand   . Osteoporosis, unspecified 03/06/2012    A DXA bone density scan done 02/20/2012 showed osteoporosis with a lumbar spine T-score of -4.4 and a right femur T-score of -2.8.  Patient has chronic kidney disease with estimated GFR less than 30, and it is not clear how much of her osteoporosis may be due to renal osteodystrophy.    [name] GERD (gastroesophageal reflux disease)   . Chronic knee pain     bilateral  . Chronic ankle pain     bilateral  . Chronic thumb pain   . Renal failure     Formerly on hemodialysis; managed by Dr. Corliss Parish  . Chronic kidney disease, stage IV (severe) ([place])     [name] 02/09/2014  . History of blood transfusion     "low blood count"  . Gout   . Epicondylitis   . Arthritis     "knots on my thumbs; left elbow" (03/03/2014)  . Stroke [facility]) 2007    "when my kidneys went out & I was in a coma; weak LUE/LLE since"   Current Outpatient Prescriptions  Medication Sig Dispense Refill  . allopurinol (ZYLOPRIM) 100 MG tablet TAKE ONE TABLET BY MOUTH ONCE DAILY 30 tablet 5  . amLODipine (NORVASC) 10 MG tablet TAKE ONE TABLET BY MOUTH AT BEDTIME 60 tablet 0  . atenolol (TENORMIN)  25 MG tablet TAKE ONE TABLET BY MOUTH AT BEDTIME 60 tablet 3  . buPROPion (WELLBUTRIN XL) 150 MG 24 hr tablet TAKE ONE TABLET BY MOUTH ONCE DAILY 60 tablet 3  . colchicine 0.6 MG tablet Take 0.5 tablets (0.3 mg total) by mouth daily. 15 tablet 0  . furosemide (LASIX) 80 MG tablet TAKE ONE-HALF TABLET BY MOUTH ONCE DAILY 30 tablet 2  . HYDROmorphone (DILAUDID) 2 MG tablet Take 1 tablet (2 mg total) by mouth every 4 (four) hours as needed for severe pain. 10 tablet 0  . meclizine (ANTIVERT) 12.5 MG tablet Take 12.5 mg by mouth daily.     . Multiple Vitamins-Minerals (CENTRUM SILVER ADULT 50+ PO) Take 1 tablet by mouth daily.    [name] omeprazole (PRILOSEC) 20 MG capsule TAKE ONE CAPSULE BY MOUTH TWICE DAILY 120 capsule 2  . oxyCODONE-acetaminophen  (PERCOCET/ROXICET) 5-325 MG tablet Take 2 tablets by mouth every 6 (six) hours as needed for moderate pain.     . SENSIPAR 30 MG tablet TAKE ONE TABLET BY MOUTH DAILY 60 tablet 0  . Trolamine Salicylate (ASPERCREME EX) Apply 1 application topically daily as needed (hand pain).    . [DISCONTINUED] FLUoxetine (PROZAC) 10 MG tablet Take 1 tablet (10 mg total) by mouth daily. 30 tablet 2   No current facility-administered medications for this visit.   Family History  Problem Relation Age of Onset  . Hypertension Brother   . Heart disease Father   . Diabetes Brother   . Breast cancer Neg Hx   . Colon cancer Neg Hx   . Lung cancer Neg Hx   . Cervical cancer Neg Hx   . Sickle cell anemia Neg Hx    Social History   Social History  . Marital Status: Legally Separated    Spouse Name: N/A  . Number of Children: N/A  . Years of Education: N/A   Social History Main Topics  . Smoking status: Former Smoker -- 0.10 packs/day for 1 years    Types: Cigarettes    Quit date: 01/02/1960  . Smokeless tobacco: Never Used  . Alcohol Use: No  . Drug Use: No  . Sexual Activity: Not on file   Other Topics Concern  . Not on file   Social History Narrative   Review of Systems: She has had intermittent dizziness and worsening hearing loss.  Balance of 10 point ROS otherwise negative.  Objective:  Physical Exam: There were no vitals filed for this visit. Physical Exam  Constitutional: She is oriented to person, place, and time and well-developed, well-nourished, and in no distress.  HENT:  Head: Normocephalic and atraumatic.  Bilateral external ear exams normal.  TM normal.  Weber heard best in right ear.  Bone conduction > air conduction bilaterally.  Eyes: EOM are normal. No scleral icterus.  Neck: No tracheal deviation present.  Cardiovascular: Normal rate, regular rhythm and normal heart sounds.   No bruits.  Pulmonary/Chest: Effort normal and breath sounds normal. No stridor. No  respiratory distress. She has no wheezes.  Neurological: She is alert and oriented to person, place, and time.  CN II-XII intact to bedside testing.  Bilateral UE strength 5/5.  RLE 5/5 to hip and knee flexion/extension.  LLE strength 4/5 to hip and knee flexion/extension.  Romberg negative.  Skin: Skin is warm and dry.     Assessment & Plan:   Patient and case were discussed with Dr. Evette Doffing.  Please refer to Problem Based charting for further documentation.

## 2015-01-24 NOTE — Assessment & Plan Note (Signed)
Patient reports CVA in 2007 while in [place].  She has residual LLE weakness that is unchanged from baseline.  Rest of her neuro exam is unremarkable.  She is not on ASA or statin, which she states is because no one told her to take them. Will get lipid panel today and start ASA 81mg  and Atorvastatin 40 mg.  Recheck lipids in 6-8 weeks.

## 2015-01-24 NOTE — Patient Instructions (Signed)
1. Take Aspirin 81 mg daily. 2. Start taking Atorvastatin (Lipitor) 40 mg daily. 3. Follow up with ENT about the hearing.  Tinnitus Tinnitus refers to hearing a sound when there is no actual source for that sound. This is often described as ringing in the ears. However, people with this condition may hear a variety of noises. A person may hear the sound in one ear or in both ears.  The sounds of tinnitus can be soft, loud, or somewhere in between. Tinnitus can last for a few seconds or can be constant for days. It may go away without treatment and come back at various times. When tinnitus is constant or happens often, it can lead to other problems, such as trouble sleeping and trouble concentrating. Almost everyone experiences tinnitus at some point. Tinnitus that is long-lasting (chronic) or comes back often is a problem that may require medical attention.  CAUSES  The cause of tinnitus is often not known. In some cases, it can result from other problems or conditions, including:   Exposure to loud noises from machinery, music, or other sources.  Hearing loss.  Ear or sinus infections.  Earwax buildup.  A foreign object in the ear.  Use of certain medicines.  Use of alcohol and caffeine.  High blood pressure.  Heart diseases.  Anemia.  Allergies.  Meniere disease.  Thyroid problems.  Tumors.  An enlarged part of a weakened blood vessel (aneurysm). SYMPTOMS The main symptom of tinnitus is hearing a sound when there is no source for that sound. It may sound like:   Buzzing.  Roaring.  Ringing.  Blowing air, similar to the sound heard when you listen to a seashell.  Hissing.  Whistling.  Sizzling.  Humming.  Running water.  A sustained musical note. DIAGNOSIS  Tinnitus is diagnosed based on your symptoms. Your health care provider will do a physical exam. A comprehensive hearing exam (audiologic exam) will be done if your tinnitus:   Affects only one ear  (unilateral).  Causes hearing difficulties.  Lasts 6 months or longer. You may also need to see a health care provider who specializes in hearing disorders (audiologist). You may be asked to complete a questionnaire to determine the severity of your tinnitus. Tests may be done to help determine the cause and to rule out other conditions. These can include:  Imaging studies of your head and brain, such as:  A CT scan.  An MRI.  An imaging study of your blood vessels (angiogram). TREATMENT  Treating an underlying medical condition can sometimes make tinnitus go away. If your tinnitus continues, other treatments may include:  Medicines, such as certain antidepressants or sleeping aids.  Sound generators to mask the tinnitus. These include:  Tabletop sound machines that play relaxing sounds to help you fall asleep.  Wearable devices that fit in your ear and play sounds or music.  A small device that uses headphones to deliver a signal embedded in music (acoustic neural stimulation). In time, this may change the pathways of your brain and make you less sensitive to tinnitus. This device is used for very severe cases when no other treatment is working.  Therapy and counseling to help you manage the stress of living with tinnitus.  Using hearing aids or cochlear implants, if your tinnitus is related to hearing loss. HOME CARE INSTRUCTIONS  When possible, avoid being in loud places and being exposed to loud sounds.  Wear hearing protection, such as earplugs, when you are exposed to  loud noises.  Do not take stimulants, such as nicotine, alcohol, or caffeine.  Practice techniques for reducing stress, such as meditation, yoga, or deep breathing.  Use a white noise machine, a humidifier, or other devices to mask the sound of tinnitus.  Sleep with your head slightly raised. This may reduce the impact of tinnitus.  Try to get plenty of rest each night. SEEK MEDICAL CARE IF:  You  have tinnitus in just one ear.  Your tinnitus continues for 3 weeks or longer without stopping.  Home care measures are not helping.  You have tinnitus after a head injury.  You have tinnitus along with any of the following:  Dizziness.  Loss of balance.  Nausea and vomiting.   This information is not intended to replace advice given to you by your health care provider. Make sure you discuss any questions you have with your health care provider.   Document Released: 12/18/2004 Document Revised: 01/08/2014 Document Reviewed: 05/20/2013 Elsevier Interactive Patient Education [employer].

## 2015-01-24 NOTE — Assessment & Plan Note (Addendum)
Cause of patient's tinnitus is currently unclear.  While she denies vertigo, she is already taking Meclizine.  Therefore, she has the triad of Meniere's disease.  However, her symptoms could also be due to vascular (pulsatile nature), presbycusis, or otosclerosis. Her Weber and Rinne tests are consistent with sensorineural hearing loss.  Remainder of her neuro exam is unremarkable, decreasing concern for new CVA.  Will defer MRI for now and refer to ENT at patient's request. She was counseled until then that it may be a chronic condition requiring coping mechanisms.  Soft background music or white noise was recommended.

## 2015-01-25 ENCOUNTER — Emergency Department (HOSPITAL_COMMUNITY)
Admission: EM | Admit: 2015-01-25 | Discharge: 2015-01-25 | Disposition: A | Discharge status: Home / Self Care | Payer: Medicare Other | Attending: Emergency Medicine | Admitting: Emergency Medicine

## 2015-01-25 ENCOUNTER — Encounter (HOSPITAL_COMMUNITY): Payer: Self-pay | Admitting: Emergency Medicine

## 2015-01-25 DIAGNOSIS — M109 Gout, unspecified: Secondary | ICD-10-CM | POA: Insufficient documentation

## 2015-01-25 DIAGNOSIS — G8929 Other chronic pain: Secondary | ICD-10-CM | POA: Diagnosis not present

## 2015-01-25 DIAGNOSIS — K219 Gastro-esophageal reflux disease without esophagitis: Secondary | ICD-10-CM | POA: Diagnosis not present

## 2015-01-25 DIAGNOSIS — Z8673 Personal history of transient ischemic attack (TIA), and cerebral infarction without residual deficits: Secondary | ICD-10-CM | POA: Insufficient documentation

## 2015-01-25 DIAGNOSIS — Z8659 Personal history of other mental and behavioral disorders: Secondary | ICD-10-CM | POA: Diagnosis not present

## 2015-01-25 DIAGNOSIS — Z992 Dependence on renal dialysis: Secondary | ICD-10-CM | POA: Diagnosis not present

## 2015-01-25 DIAGNOSIS — Z8619 Personal history of other infectious and parasitic diseases: Secondary | ICD-10-CM | POA: Insufficient documentation

## 2015-01-25 DIAGNOSIS — Z87448 Personal history of other diseases of urinary system: Secondary | ICD-10-CM | POA: Diagnosis not present

## 2015-01-25 DIAGNOSIS — Z79899 Other long term (current) drug therapy: Secondary | ICD-10-CM | POA: Insufficient documentation

## 2015-01-25 DIAGNOSIS — N184 Chronic kidney disease, stage 4 (severe): Secondary | ICD-10-CM | POA: Diagnosis not present

## 2015-01-25 DIAGNOSIS — Z7982 Long term (current) use of aspirin: Secondary | ICD-10-CM | POA: Diagnosis not present

## 2015-01-25 DIAGNOSIS — I129 Hypertensive chronic kidney disease with stage 1 through stage 4 chronic kidney disease, or unspecified chronic kidney disease: Secondary | ICD-10-CM | POA: Insufficient documentation

## 2015-01-25 DIAGNOSIS — Z87891 Personal history of nicotine dependence: Secondary | ICD-10-CM | POA: Diagnosis not present

## 2015-01-25 DIAGNOSIS — M1990 Unspecified osteoarthritis, unspecified site: Secondary | ICD-10-CM | POA: Insufficient documentation

## 2015-01-25 DIAGNOSIS — Z862 Personal history of diseases of the blood and blood-forming organs and certain disorders involving the immune mechanism: Secondary | ICD-10-CM | POA: Insufficient documentation

## 2015-01-25 DIAGNOSIS — H9312 Tinnitus, left ear: Secondary | ICD-10-CM

## 2015-01-25 LAB — LIPID PANEL
Chol/HDL Ratio: 2.2 ratio units (ref 0.0–4.4)
Cholesterol, Total: 184 mg/dL (ref 100–199)
HDL: 85 mg/dL (ref >39)
LDL Calculated: 77 mg/dL (ref 0–99)
Triglycerides: 110 mg/dL (ref 0–149)
VLDL Cholesterol Cal: 22 mg/dL (ref 5–40)

## 2015-01-25 LAB — BASIC METABOLIC PANEL
Anion gap: 11 (ref 5–15)
BUN: 32 mg/dL — ABNORMAL HIGH (ref 6–20)
CO2: 26 mmol/L (ref 22–32)
Calcium: 9.1 mg/dL (ref 8.9–10.3)
Chloride: 104 mmol/L (ref 101–111)
Creatinine, Ser: 2.36 mg/dL — ABNORMAL HIGH (ref 0.44–1.00)
GFR calc Af Amer: 22 mL/min — ABNORMAL LOW (ref >60)
GFR calc non Af Amer: 19 mL/min — ABNORMAL LOW (ref >60)
Glucose, Bld: 109 mg/dL — ABNORMAL HIGH (ref 65–99)
Potassium: 4.1 mmol/L (ref 3.5–5.1)
Sodium: 141 mmol/L (ref 135–145)

## 2015-01-25 LAB — CBC WITH DIFFERENTIAL/PLATELET
Basophils Absolute: 0 10*3/uL (ref 0.0–0.1)
Basophils Relative: 0 %
Eosinophils Absolute: 0.2 10*3/uL (ref 0.0–0.7)
Eosinophils Relative: 4 %
HCT: 36.7 % (ref 36.0–46.0)
Hemoglobin: 11.8 g/dL — ABNORMAL LOW (ref 12.0–15.0)
Lymphocytes Relative: 30 %
Lymphs Abs: 1.4 10*3/uL (ref 0.7–4.0)
MCH: 30.5 pg (ref 26.0–34.0)
MCHC: 32.2 g/dL (ref 30.0–36.0)
MCV: 94.8 fL (ref 78.0–100.0)
Monocytes Absolute: 0.3 10*3/uL (ref 0.1–1.0)
Monocytes Relative: 7 %
Neutro Abs: 2.7 10*3/uL (ref 1.7–7.7)
Neutrophils Relative %: 59 %
Platelets: 203 10*3/uL (ref 150–400)
RBC: 3.87 MIL/uL (ref 3.87–5.11)
RDW: 14.5 % (ref 11.5–15.5)
WBC: 4.7 10*3/uL (ref 4.0–10.5)

## 2015-01-25 NOTE — ED Notes (Signed)
Pt departed in NAD.  

## 2015-01-25 NOTE — Discharge Instructions (Signed)
Tinnitus Tinnitus refers to hearing a sound when there is no actual source for that sound. This is often described as ringing in the ears. However, people with this condition may hear a variety of noises. A person may hear the sound in one ear or in both ears.  The sounds of tinnitus can be soft, loud, or somewhere in between. Tinnitus can last for a few seconds or can be constant for days. It may go away without treatment and come back at various times. When tinnitus is constant or happens often, it can lead to other problems, such as trouble sleeping and trouble concentrating. Almost everyone experiences tinnitus at some point. Tinnitus that is long-lasting (chronic) or comes back often is a problem that may require medical attention.  CAUSES  The cause of tinnitus is often not known. In some cases, it can result from other problems or conditions, including:   Exposure to loud noises from machinery, music, or other sources.  Hearing loss.  Ear or sinus infections.  Earwax buildup.  A foreign object in the ear.  Use of certain medicines.  Use of alcohol and caffeine.  High blood pressure.  Heart diseases.  Anemia.  Allergies.  Meniere disease.  Thyroid problems.  Tumors.  An enlarged part of a weakened blood vessel (aneurysm). SYMPTOMS The main symptom of tinnitus is hearing a sound when there is no source for that sound. It may sound like:   Buzzing.  Roaring.  Ringing.  Blowing air, similar to the sound heard when you listen to a seashell.  Hissing.  Whistling.  Sizzling.  Humming.  Running water.  A sustained musical note. DIAGNOSIS  Tinnitus is diagnosed based on your symptoms. Your health care provider will do a physical exam. A comprehensive hearing exam (audiologic exam) will be done if your tinnitus:   Affects only one ear (unilateral).  Causes hearing difficulties.  Lasts 6 months or longer. You may also need to see a health care provider  who specializes in hearing disorders (audiologist). You may be asked to complete a questionnaire to determine the severity of your tinnitus. Tests may be done to help determine the cause and to rule out other conditions. These can include:  Imaging studies of your head and brain, such as:  A CT scan.  An MRI.  An imaging study of your blood vessels (angiogram). TREATMENT  Treating an underlying medical condition can sometimes make tinnitus go away. If your tinnitus continues, other treatments may include:  Medicines, such as certain antidepressants or sleeping aids.  Sound generators to mask the tinnitus. These include:  Tabletop sound machines that play relaxing sounds to help you fall asleep.  Wearable devices that fit in your ear and play sounds or music.  A small device that uses headphones to deliver a signal embedded in music (acoustic neural stimulation). In time, this may change the pathways of your brain and make you less sensitive to tinnitus. This device is used for very severe cases when no other treatment is working.  Therapy and counseling to help you manage the stress of living with tinnitus.  Using hearing aids or cochlear implants, if your tinnitus is related to hearing loss. HOME CARE INSTRUCTIONS  When possible, avoid being in loud places and being exposed to loud sounds.  Wear hearing protection, such as earplugs, when you are exposed to loud noises.  Do not take stimulants, such as nicotine, alcohol, or caffeine.  Practice techniques for reducing stress, such as meditation, yoga,  or deep breathing.  Use a white noise machine, a humidifier, or other devices to mask the sound of tinnitus.  Sleep with your head slightly raised. This may reduce the impact of tinnitus.  Try to get plenty of rest each night. SEEK MEDICAL CARE IF:  You have tinnitus in just one ear.  Your tinnitus continues for 3 weeks or longer without stopping.  Home care measures are not  helping.  You have tinnitus after a head injury.  You have tinnitus along with any of the following:  Dizziness.  Loss of balance.  Nausea and vomiting.   This information is not intended to replace advice given to you by your health care provider. Make sure you discuss any questions you have with your health care provider.   Document Released: 12/18/2004 Document Revised: 01/08/2014 Document Reviewed: 05/20/2013 Elsevier Interactive Patient Education 2016 [employer]   Possible Meniere Disease Meniere disease is an inner ear disorder. It causes attacks of a spinning sensation (vertigo) and ringing in the ear (tinnitus). It also causes hearing loss and a sensation of fullness or pressure in your ear.  Meniere disease is lifelong. It may get worse over time. Symptoms usually begin in one ear but may eventually affect both ears.  CAUSES Meniere disease is caused by having too much of the fluid that is in your inner ear (endolymph). When endolymph builds up in your inner ear, it affects the nerves that control balance and hearing. The reason for the endolymph buildup is not known. Possible causes include:  Allergy.  An abnormal reaction of the body's defense system (autoimmune disease).  Viral infection of the inner ear.  Head injury. RISK FACTORS  Age older than 40 years.  Family history of Meniere disease.  History of autoimmune disease.  History of migraine headaches. SIGNS AND SYMPTOMS Symptoms of Meniere disease can come and go and may last for up to 4 hours at a time. Symptoms usually start in one ear and may become more frequent and eventually involve both ears. Symptoms can include:  Fullness and pressure in your ear.  Roaring or ringing in your ear.  Vertigo and loss of balance.  Decreased hearing.  Nausea and vomiting. DIAGNOSIS Your health care provider will perform a physical exam. Tests may be done to confirm a diagnosis of Meniere disease. These  tests may include:  A hearing test (audiogram).  An electronystagmogram. This tests your balance nerve (vestibular nerve).  Imaging studies, such as CT or MRI, of your inner ear. TREATMENT There is no cure for Meniere disease, but it can be managed. Management may include:  A diet that may help relieve symptoms of Meniere disease.  Use of medicines to reduce:  Vertigo.  Nausea.  Fluid retention.  Use of an air pressure pulse generator. This is a machine that sends small pressure pulses into your ear canal.  Inner ear surgery (rare). When you experience symptoms, it can be helpful to lie down on a flat surface and focus your eyes on one object that does not move. Try to stay in that position until your symptoms go away.  HOME CARE INSTRUCTIONS   Take medicines only as directed by your health care provider.  Eat the same amount of food at the same time every day, including snacks.  Do not skip meals.  Limit the salt in your diet to 1,000 mg a day.  Avoid caffeine.  Limit alcoholic drinks to one drink a day.  Do not eat foods  containing monosodium glutamate (MSG).  Drink enough fluids to keep your urine clear or pale yellow.  Do not use any tobacco products including cigarettes, chewing tobacco, or electronic cigarettes. If you need help quitting, ask your health care provider.  Find ways to reduce or avoid stress. SEEK MEDICAL CARE IF:   You have symptoms that last longer than 4 hours.  You have new or more severe symptoms. SEEK IMMEDIATE MEDICAL CARE IF:   You have been vomiting for 24 hours.  You are not able to keep fluids down.  You have chest pain or trouble breathing.   This information is not intended to replace advice given to you by your health care provider. Make sure you discuss any questions you have with your health care provider.   Document Released: 12/16/1999 Document Revised: 01/08/2014 Document Reviewed: 12/01/2012 Elsevier Interactive  Patient Education [employer].

## 2015-01-25 NOTE — ED Notes (Signed)
Pt. reports persistent left ear tinnitus for 3 weeks seen by her PCP yesterday referred her to an ENT .

## 2015-01-25 NOTE — ED Provider Notes (Signed)
TIME SEEN: 5:40 AM  CHIEF COMPLAINT: Tinnitus  HPI: Pt is a 81 y.o. female with history of hypertension, chronic kidney disease who presents emergency department with 3-4 weeks of left ear tinnitus. Reports she has had hearing loss for the past several months as well and does have a hearing aid. Also reports a chronic history of vertigo which is controlled with meclizine. Has had a history of a prior stroke with left upper and lower extremity weakness at baseline which is unchanged. No new numbness, tingling or weakness. No head injury. Not on anticoagulation. Denies headache, chest pain or shortness of breath. No ear pain or drainage from the ear. No fever.  ROS: See HPI Constitutional: no fever  Eyes: no drainage  ENT: no runny nose   Cardiovascular:  no chest pain  Resp: no SOB  GI: no vomiting GU: no dysuria Integumentary: no rash  Allergy: no hives  Musculoskeletal: no leg swelling  Neurological: no slurred speech ROS otherwise negative  PAST MEDICAL HISTORY/PAST SURGICAL HISTORY:  Past Medical History  Diagnosis Date  . Depression   . Hypertension   . Gastritis, Helicobacter pylori     Noted on EGD 01/18/2009 by [name];  biopsy showed chronic gastritis with Helicobacter pylori, treated by [name].  . Pericardial effusion 11/2004    S/P subxiphoid pericardial window 11/01/2004 by [name].  A question of possible collagen vascular disease had been raised in 2006 due to arthralgias, a history of idiopathic pericarditis, and increased pigmentation of her palms, and she was referred to [name] but he was unable to confirm a rheumatologic diagnosis.  [name] Hemorrhoids, internal 05/09/2005     Found on colonoscopy 05/09/2005 by [name]  . Cataracts, bilateral   . Pinguecula   . Vitreous detachment     "no OR"  . Thigh pain   . Anemia, iron deficiency   . Angiodysplasia of colon 05/09/2005    Single small angiodysplastic lesion in the  descending colon found on colonoscopy 05/09/2005 by [name]  . Anemia due to blood loss, chronic   . Stiffness of joint, not elsewhere classified, hand   . Osteoporosis, unspecified 03/06/2012    A DXA bone density scan done 02/20/2012 showed osteoporosis with a lumbar spine T-score of -4.4 and a right femur T-score of -2.8.  Patient has chronic kidney disease with estimated GFR less than 30, and it is not clear how much of her osteoporosis may be due to renal osteodystrophy.    [name] GERD (gastroesophageal reflux disease)   . Chronic knee pain     bilateral  . Chronic ankle pain     bilateral  . Chronic thumb pain   . Renal failure     Formerly on hemodialysis; managed by Dr. Corliss Parish  . Chronic kidney disease, stage IV (severe) ([place])     [name] 02/09/2014  . History of blood transfusion     "low blood count"  . Gout   . Epicondylitis   . Arthritis     "knots on my thumbs; left elbow" (03/03/2014)  . Stroke [facility]) 2007    "when my kidneys went out & I was in a coma; weak LUE/LLE since"    MEDICATIONS:  Prior to Admission medications   Medication Sig Start Date End Date Taking? Authorizing Provider  allopurinol (ZYLOPRIM) 100 MG tablet TAKE ONE TABLET BY MOUTH ONCE DAILY 12/15/14   Bartholomew Crews, MD  amLODipine (NORVASC) 10 MG tablet TAKE  ONE TABLET BY MOUTH AT BEDTIME 01/20/15   [name], MD  aspirin EC 81 MG tablet Take 1 tablet (81 mg total) by mouth daily. 01/24/15 01/24/16  Iline Oven, MD  atenolol (TENORMIN) 25 MG tablet TAKE ONE TABLET BY MOUTH AT BEDTIME 07/22/14   [name], MD  atorvastatin (LIPITOR) 40 MG tablet Take 1 tablet (40 mg total) by mouth daily. 01/24/15   Iline Oven, MD  buPROPion (WELLBUTRIN XL) 150 MG 24 hr tablet TAKE ONE TABLET BY MOUTH ONCE DAILY 05/25/14   [name], MD  colchicine 0.6 MG tablet Take 0.5 tablets (0.3 mg total) by mouth daily. 01/24/15   Iline Oven, MD  furosemide (LASIX) 80 MG tablet TAKE  ONE-HALF TABLET BY MOUTH ONCE DAILY 12/06/14   [name], MD  HYDROmorphone (DILAUDID) 2 MG tablet Take 1 tablet (2 mg total) by mouth every 4 (four) hours as needed for severe pain. [id number]   Delora Fuel, MD  meclizine (ANTIVERT) 12.5 MG tablet Take 12.5 mg by mouth daily.     Historical Provider, MD  Multiple Vitamins-Minerals (CENTRUM SILVER ADULT 50+ PO) Take 1 tablet by mouth daily.    Historical Provider, MD  omeprazole (PRILOSEC) 20 MG capsule TAKE ONE CAPSULE BY MOUTH TWICE DAILY 07/14/14   [name], MD  oxyCODONE-acetaminophen (PERCOCET/ROXICET) 5-325 MG tablet Take 2 tablets by mouth every 6 (six) hours as needed for moderate pain.  10/08/14   Historical Provider, MD  SENSIPAR 30 MG tablet TAKE ONE TABLET BY MOUTH DAILY 11/17/14   [name], MD  Trolamine Salicylate (ASPERCREME EX) Apply 1 application topically daily as needed (hand pain).    Historical Provider, MD    ALLERGIES:  Allergies  Allergen Reactions  . Ibuprofen Other (See Comments)    PT limited intake because of chronic kidney DX    SOCIAL HISTORY:  Social History  Substance Use Topics  . Smoking status: Former Smoker -- 0.10 packs/day for 1 years    Types: Cigarettes    Quit date: 01/02/1960  . Smokeless tobacco: Never Used  . Alcohol Use: No    FAMILY HISTORY: Family History  Problem Relation Age of Onset  . Hypertension Brother   . Heart disease Father   . Diabetes Brother   . Breast cancer Neg Hx   . Colon cancer Neg Hx   . Lung cancer Neg Hx   . Cervical cancer Neg Hx   . Sickle cell anemia Neg Hx     EXAM: BP 164/94 mmHg  Pulse 77  Temp(Src) 98.5 F (36.9 C) (Oral)  Resp 14  SpO2 99% CONSTITUTIONAL: Alert and oriented and responds appropriately to questions. Well-appearing; well-nourished HEAD: Normocephalic EYES: Conjunctivae clear, PERRL ENT: normal nose; no rhinorrhea; moist mucous membranes; pharynx without lesions noted, TMs are clear bilaterally without erythema,  bulging, purulence, effusion, perforation, no cerumen impaction, no pain with manipulation of the pinna, no signs of mastoiditis NECK: Supple, no meningismus, no LAD  CARD: RRR; S1 and S2 appreciated; no murmurs, no clicks, no rubs, no gallops RESP: Normal chest excursion without splinting or tachypnea; breath sounds clear and equal bilaterally; no wheezes, no rhonchi, no rales, no hypoxia or respiratory distress, speaking full sentences ABD/GI: Normal bowel sounds; non-distended; soft, non-tender, no rebound, no guarding, no peritoneal signs BACK:  The back appears normal and is non-tender to palpation, there is no CVA tenderness EXT: Normal ROM in all joints; non-tender to palpation; no edema; normal capillary refill; no  cyanosis, no calf tenderness or swelling    SKIN: Normal color for age and race; warm NEURO: Moves all extremities equally, sensation to light touch intact diffusely, cranial nerves II through XII intact, strength is decreased in the left upper and lower extremity compared to the right which is chronic, normal gait PSYCH: The patient's mood and manner are appropriate. Grooming and personal hygiene are appropriate.  MEDICAL DECISION MAKING: Patient here with chronic tinnitus. She also has hearing loss for 6 months. Was seen by her PCP earlier yesterday. Has history of vertigo as well that is also chronic and well controlled with meclizine. They were concerned for possible Mnire's disease. She has chronic kidney disease which is unchanged. Otherwise her labs are normal. No sign of otitis media, otitis externa, mastoiditis. No cerumen impaction. She denies any new medications recently. She does not take salicylates regular but was started on aspirin 81 mg yesterday by her primary care provider. She has not been on any antibiotics recently. Discussed with patient that there are no medications that can improve her tinnitus and it is usually behavioral therapy.  We have referred her as well  as her PCP to ENT. I do not feel she needs further emergent workup. No new focal neurologic deficits on exam. Discussed return precautions. She verbalized understanding and is comfortable with this plan.      [place], DO 01/25/15 [phone]

## 2015-01-26 ENCOUNTER — Other Ambulatory Visit: Payer: Self-pay

## 2015-01-26 ENCOUNTER — Emergency Department (HOSPITAL_COMMUNITY): Payer: Medicare Other

## 2015-01-26 ENCOUNTER — Encounter (HOSPITAL_COMMUNITY): Payer: Self-pay | Admitting: Emergency Medicine

## 2015-01-26 ENCOUNTER — Emergency Department (HOSPITAL_COMMUNITY)
Admission: EM | Admit: 2015-01-26 | Discharge: 2015-01-26 | Disposition: A | Discharge status: Home / Self Care | Payer: Medicare Other | Attending: Emergency Medicine | Admitting: Emergency Medicine

## 2015-01-26 DIAGNOSIS — R42 Dizziness and giddiness: Secondary | ICD-10-CM | POA: Insufficient documentation

## 2015-01-26 DIAGNOSIS — Z8673 Personal history of transient ischemic attack (TIA), and cerebral infarction without residual deficits: Secondary | ICD-10-CM | POA: Diagnosis not present

## 2015-01-26 DIAGNOSIS — H9319 Tinnitus, unspecified ear: Secondary | ICD-10-CM | POA: Diagnosis not present

## 2015-01-26 DIAGNOSIS — Z79899 Other long term (current) drug therapy: Secondary | ICD-10-CM | POA: Diagnosis not present

## 2015-01-26 DIAGNOSIS — Z7982 Long term (current) use of aspirin: Secondary | ICD-10-CM | POA: Diagnosis not present

## 2015-01-26 DIAGNOSIS — Z862 Personal history of diseases of the blood and blood-forming organs and certain disorders involving the immune mechanism: Secondary | ICD-10-CM | POA: Insufficient documentation

## 2015-01-26 DIAGNOSIS — G8929 Other chronic pain: Secondary | ICD-10-CM | POA: Diagnosis not present

## 2015-01-26 DIAGNOSIS — Z87891 Personal history of nicotine dependence: Secondary | ICD-10-CM | POA: Insufficient documentation

## 2015-01-26 DIAGNOSIS — F329 Major depressive disorder, single episode, unspecified: Secondary | ICD-10-CM | POA: Diagnosis not present

## 2015-01-26 DIAGNOSIS — R112 Nausea with vomiting, unspecified: Secondary | ICD-10-CM | POA: Diagnosis not present

## 2015-01-26 DIAGNOSIS — M1990 Unspecified osteoarthritis, unspecified site: Secondary | ICD-10-CM | POA: Diagnosis not present

## 2015-01-26 DIAGNOSIS — M109 Gout, unspecified: Secondary | ICD-10-CM | POA: Insufficient documentation

## 2015-01-26 DIAGNOSIS — N184 Chronic kidney disease, stage 4 (severe): Secondary | ICD-10-CM | POA: Insufficient documentation

## 2015-01-26 DIAGNOSIS — I129 Hypertensive chronic kidney disease with stage 1 through stage 4 chronic kidney disease, or unspecified chronic kidney disease: Secondary | ICD-10-CM | POA: Diagnosis not present

## 2015-01-26 DIAGNOSIS — K219 Gastro-esophageal reflux disease without esophagitis: Secondary | ICD-10-CM | POA: Diagnosis not present

## 2015-01-26 DIAGNOSIS — Z8619 Personal history of other infectious and parasitic diseases: Secondary | ICD-10-CM | POA: Insufficient documentation

## 2015-01-26 DIAGNOSIS — Z9889 Other specified postprocedural states: Secondary | ICD-10-CM | POA: Diagnosis not present

## 2015-01-26 LAB — BASIC METABOLIC PANEL
Anion gap: 12 (ref 5–15)
BUN: 29 mg/dL — ABNORMAL HIGH (ref 6–20)
CO2: 25 mmol/L (ref 22–32)
Calcium: 9 mg/dL (ref 8.9–10.3)
Chloride: 106 mmol/L (ref 101–111)
Creatinine, Ser: 2.1 mg/dL — ABNORMAL HIGH (ref 0.44–1.00)
GFR calc Af Amer: 25 mL/min — ABNORMAL LOW (ref >60)
GFR calc non Af Amer: 21 mL/min — ABNORMAL LOW (ref >60)
Glucose, Bld: 97 mg/dL (ref 65–99)
Potassium: 4.5 mmol/L (ref 3.5–5.1)
Sodium: 143 mmol/L (ref 135–145)

## 2015-01-26 LAB — URINE MICROSCOPIC-ADD ON

## 2015-01-26 LAB — CBC WITH DIFFERENTIAL/PLATELET
Basophils Absolute: 0 10*3/uL (ref 0.0–0.1)
Basophils Relative: 0 %
Eosinophils Absolute: 0.2 10*3/uL (ref 0.0–0.7)
Eosinophils Relative: 4 %
HCT: 37.9 % (ref 36.0–46.0)
Hemoglobin: 12.5 g/dL (ref 12.0–15.0)
Lymphocytes Relative: 28 %
Lymphs Abs: 1.4 10*3/uL (ref 0.7–4.0)
MCH: 31.6 pg (ref 26.0–34.0)
MCHC: 33 g/dL (ref 30.0–36.0)
MCV: 95.9 fL (ref 78.0–100.0)
Monocytes Absolute: 0.3 10*3/uL (ref 0.1–1.0)
Monocytes Relative: 6 %
Neutro Abs: 3 10*3/uL (ref 1.7–7.7)
Neutrophils Relative %: 62 %
Platelets: 207 10*3/uL (ref 150–400)
RBC: 3.95 MIL/uL (ref 3.87–5.11)
RDW: 14.4 % (ref 11.5–15.5)
WBC: 4.9 10*3/uL (ref 4.0–10.5)

## 2015-01-26 LAB — URINALYSIS, ROUTINE W REFLEX MICROSCOPIC
Bilirubin Urine: NEGATIVE
Glucose, UA: NEGATIVE mg/dL
Hgb urine dipstick: NEGATIVE
Ketones, ur: NEGATIVE mg/dL
Nitrite: NEGATIVE
Protein, ur: 30 mg/dL — AB
Specific Gravity, Urine: 1.016 (ref 1.005–1.030)
pH: 6 (ref 5.0–8.0)

## 2015-01-26 LAB — CBG MONITORING, ED: Glucose-Capillary: 78 mg/dL (ref 65–99)

## 2015-01-26 LAB — I-STAT TROPONIN, ED: Troponin i, poc: 0.03 ng/mL (ref 0.00–0.08)

## 2015-01-26 MED ORDER — ONDANSETRON 4 MG PO TBDP
4.0000 mg | ORAL_TABLET | Freq: Once | ORAL | Status: AC
  Administered 2015-01-26: 4 mg via ORAL
  Filled 2015-01-26: qty 1

## 2015-01-26 MED ORDER — LORAZEPAM 1 MG PO TABS
1.0000 mg | ORAL_TABLET | Freq: Once | ORAL | Status: AC
  Administered 2015-01-26: 1 mg via ORAL
  Filled 2015-01-26: qty 1

## 2015-01-26 NOTE — ED Provider Notes (Signed)
CSN: [id number]     Arrival date & time 01/26/15  [id number] History   First MD Initiated Contact with Patient 01/26/15 [phone]     Chief Complaint  Patient presents with  . Dizziness     (Consider location/radiation/quality/duration/timing/severity/associated sxs/prior Treatment) HPI   [name] is a 81 y.o. female, with a history of depression, anemia, CKD stage IV, and CVA, presenting to the ED with "vertigo" that started 2 days ago, but worse this morning. Pt states she was seen twice this week for "ringing in the ears." Pt describes her vertigo as the room spinning around her. Pt currently denies tinnitus. Pt has an appointment for ENT in two days. Pt also complains of some N/V today. Pt denies syncope, neuro deficits, chest pain, shortness of breath, urinary issues, fever/chills, or any other complaints. Patient has been taking meclizine with no improvement.    Past Medical History  Diagnosis Date  . Depression   . Hypertension   . Gastritis, Helicobacter pylori     Noted on EGD 01/18/2009 by [name];  biopsy showed chronic gastritis with Helicobacter pylori, treated by [name].  . Pericardial effusion 11/2004    S/P subxiphoid pericardial window 11/01/2004 by [name].  A question of possible collagen vascular disease had been raised in 2006 due to arthralgias, a history of idiopathic pericarditis, and increased pigmentation of her palms, and she was referred to [name] but he was unable to confirm a rheumatologic diagnosis.  [name] Hemorrhoids, internal 05/09/2005     Found on colonoscopy 05/09/2005 by [name]  . Cataracts, bilateral   . Pinguecula   . Vitreous detachment     "no OR"  . Thigh pain   . Anemia, iron deficiency   . Angiodysplasia of colon 05/09/2005    Single small angiodysplastic lesion in the descending colon found on colonoscopy 05/09/2005 by [name]  . Anemia due to blood loss, chronic   . Stiffness of joint, not  elsewhere classified, hand   . Osteoporosis, unspecified 03/06/2012    A DXA bone density scan done 02/20/2012 showed osteoporosis with a lumbar spine T-score of -4.4 and a right femur T-score of -2.8.  Patient has chronic kidney disease with estimated GFR less than 30, and it is not clear how much of her osteoporosis may be due to renal osteodystrophy.    [name] GERD (gastroesophageal reflux disease)   . Chronic knee pain     bilateral  . Chronic ankle pain     bilateral  . Chronic thumb pain   . Renal failure     Formerly on hemodialysis; managed by Dr. Corliss Parish  . Chronic kidney disease, stage IV (severe) ([place])     [name] 02/09/2014  . History of blood transfusion     "low blood count"  . Gout   . Epicondylitis   . Arthritis     "knots on my thumbs; left elbow" (03/03/2014)  . Stroke [facility]) 2007    "when my kidneys went out & I was in a coma; weak LUE/LLE since"   Past Surgical History  Procedure Laterality Date  . Subxyphoid pericardial window  11/01/2004  . Arteriovenous graft placement Left 11/28/2005    forearm arteriovenous  . Thrombectomy / arteriovenous graft revision Left 01/21/2006    Thrombectomy and revision of left forearm loop AV graft.  . Thrombectomy Left  03/06/2006    Simple thrombectomy arteriovenous Gore-Tex graft, left arm, with exploration of venous end  and intraoperative shuntogram.         . Hallux valgus correction Left 01/2012    foot  . Abdominal hysterectomy  1960's  . Tubal ligation  1960's    "before hysterectomy"  . Cardiac catheterization  01/2005    [name] 05/15/2010   Family History  Problem Relation Age of Onset  . Hypertension Brother   . Heart disease Father   . Diabetes Brother   . Breast cancer Neg Hx   . Colon cancer Neg Hx   . Lung cancer Neg Hx   . Cervical cancer Neg Hx   . Sickle cell anemia Neg Hx    Social History  Substance Use Topics  . Smoking status: Former Smoker -- 0.10 packs/day for 1 years    Types: Cigarettes     Quit date: 01/02/1960  . Smokeless tobacco: Never Used  . Alcohol Use: No   OB History    No data available     Review of Systems  HENT: Positive for tinnitus.   Respiratory: Negative for shortness of breath.   Cardiovascular: Negative for chest pain.  Gastrointestinal: Negative for nausea, vomiting, abdominal pain and diarrhea.  Genitourinary: Negative for dysuria and hematuria.  Neurological: Positive for dizziness. Negative for syncope, numbness and headaches.  All other systems reviewed and are negative.     Allergies  Ibuprofen  Home Medications   Prior to Admission medications   Medication Sig Start Date End Date Taking? Authorizing Provider  allopurinol (ZYLOPRIM) 100 MG tablet TAKE ONE TABLET BY MOUTH ONCE DAILY 12/15/14  Yes Bartholomew Crews, MD  amLODipine (NORVASC) 10 MG tablet TAKE ONE TABLET BY MOUTH AT BEDTIME 01/20/15  Yes [name], MD  aspirin EC 81 MG tablet Take 1 tablet (81 mg total) by mouth daily. 01/24/15 01/24/16 Yes Iline Oven, MD  atenolol (TENORMIN) 25 MG tablet TAKE ONE TABLET BY MOUTH AT BEDTIME 07/22/14  Yes [name], MD  atorvastatin (LIPITOR) 40 MG tablet Take 1 tablet (40 mg total) by mouth daily. 01/24/15  Yes Iline Oven, MD  buPROPion (WELLBUTRIN XL) 150 MG 24 hr tablet TAKE ONE TABLET BY MOUTH ONCE DAILY 05/25/14  Yes [name], MD  colchicine 0.6 MG tablet Take 0.5 tablets (0.3 mg total) by mouth daily. 01/24/15  Yes Iline Oven, MD  furosemide (LASIX) 80 MG tablet TAKE ONE-HALF TABLET BY MOUTH ONCE DAILY 12/06/14  Yes [name], MD  HYDROmorphone (DILAUDID) 2 MG tablet Take 1 tablet (2 mg total) by mouth every 4 (four) hours as needed for severe pain. [id number]  Yes Delora Fuel, MD  meclizine (ANTIVERT) 12.5 MG tablet Take 12.5 mg by mouth daily.    Yes Historical Provider, MD  Multiple Vitamins-Minerals (CENTRUM SILVER ADULT 50+ PO) Take 1 tablet by mouth daily.   Yes Historical Provider, MD   omeprazole (PRILOSEC) 20 MG capsule TAKE ONE CAPSULE BY MOUTH TWICE DAILY 07/14/14  Yes [name], MD  oxyCODONE-acetaminophen (PERCOCET/ROXICET) 5-325 MG tablet Take 2 tablets by mouth every 6 (six) hours as needed for moderate pain.  10/08/14  Yes Historical Provider, MD  SENSIPAR 30 MG tablet TAKE ONE TABLET BY MOUTH DAILY Patient taking differently: TAKE ONE TABLET BY MOUTH EVERY OTHER NIGHT AT BEDTIME 11/17/14  Yes [name], MD  Trolamine Salicylate (ASPERCREME EX) Apply 1 application topically daily as needed (hand pain).   Yes Historical Provider, MD   BP 134/75 mmHg  Pulse 71  Temp(Src) 98.1 F (36.7 C) (  Temporal)  Resp 23  SpO2 98% Physical Exam  Constitutional: She is oriented to person, place, and time. She appears well-developed and well-nourished. No distress.  HENT:  Head: Normocephalic and atraumatic.  Mouth/Throat: Oropharynx is clear and moist.  Eyes: Conjunctivae and EOM are normal. Pupils are equal, round, and reactive to light.  Neck: Normal range of motion. Neck supple.  Cardiovascular: Normal rate, regular rhythm, normal heart sounds and intact distal pulses.   Pulmonary/Chest: Effort normal and breath sounds normal. No respiratory distress.  Abdominal: Soft. Bowel sounds are normal.  Musculoskeletal: She exhibits no edema or tenderness.  Lymphadenopathy:    She has no cervical adenopathy.  Neurological: She is alert and oriented to person, place, and time. She has normal reflexes.  No sensory deficits. Strength 5/5 in all extremities, but comparatively weaker on the left, which patient states is from her CVA and is normal. No gait disturbance. Coordination intact. Cranial nerves III-XII grossly intact. No facial droop. No head movements or positioning seem to increase the patient's feeling of dizziness.  Skin: Skin is warm and dry. She is not diaphoretic.  Nursing note and vitals reviewed.   ED Course  Procedures (including critical care  time) Labs Review Labs Reviewed  URINALYSIS, ROUTINE W REFLEX MICROSCOPIC (NOT AT [facility] - Abnormal; Notable for the following:    Protein, ur 30 (*)    Leukocytes, UA MODERATE (*)    All other components within normal limits  BASIC METABOLIC PANEL - Abnormal; Notable for the following:    BUN 29 (*)    Creatinine, Ser 2.10 (*)    GFR calc non Af Amer 21 (*)    GFR calc Af Amer 25 (*)    All other components within normal limits  URINE MICROSCOPIC-ADD ON - Abnormal; Notable for the following:    Squamous Epithelial / LPF 0-5 (*)    Bacteria, UA RARE (*)    All other components within normal limits  URINE CULTURE  CBC WITH DIFFERENTIAL/PLATELET  I-STAT TROPOININ, ED  CBG MONITORING, ED    CBC Latest Ref Rng 01/26/2015 01/25/2015 03/05/2014  WBC 4.0 - 10.5 K/uL 4.9 4.7 5.0  Hemoglobin 12.0 - 15.0 g/dL 12.5 11.8(L) 11.3(L)  Hematocrit 36.0 - 46.0 % 37.9 36.7 34.8(L)  Platelets 150 - 400 K/uL 207 203 290   BUN  Date Value Ref Range Status  01/26/2015 29* 6 - 20 mg/dL Final  01/25/2015 32* 6 - 20 mg/dL Final  05/06/2014 39* 6 - 23 mg/dL Final  03/16/2014 31* 6 - 23 mg/dL Final   CREAT  Date Value Ref Range Status  05/06/2014 2.50* 0.50 - 1.10 mg/dL Final  03/16/2014 2.13* 0.50 - 1.10 mg/dL Final  11/03/2013 2.02* 0.50 - 1.10 mg/dL Final  01/07/2013 2.39* 0.50 - 1.10 mg/dL Final   CREATININE, SER  Date Value Ref Range Status  01/26/2015 2.10* 0.44 - 1.00 mg/dL Final  01/25/2015 2.36* 0.44 - 1.00 mg/dL Final  03/03/2014 2.17* 0.50 - 1.10 mg/dL Final  02/21/2014 2.51* 0.50 - 1.10 mg/dL Final       Imaging Review Mr Brain Wo Contrast  01/26/2015  CLINICAL DATA:  Extreme vertigo upon wakening this morning. EXAM: MRI HEAD WITHOUT CONTRAST TECHNIQUE: Multiplanar, multiecho pulse sequences of the brain and surrounding structures were obtained without intravenous contrast. COMPARISON:  03/12/2011 FINDINGS: Diffusion imaging does not show any acute or subacute infarction. The  brainstem is normal. There are a few old small vessel cerebellar infarctions. Cerebral hemispheres show age related atrophy  with moderate chronic small-vessel ischemic change of the deep white matter. No cortical or large vessel territory infarction. No intra-axial mass lesion, hemorrhage, hydrocephalus or extra-axial fluid collection. Planum sphenoidale meningioma again demonstrated, today measuring 21 mm front to back, 19 mm right to left and 15 mm cephalocaudal. No significant mass effect. When compared to the previous study, this has enlarged slightly. No pituitary mass. No inflammatory sinus disease. Major vessels at the base of the brain show flow. IMPRESSION: No acute intracranial finding. Moderate chronic small vessel disease affecting the brain. Interval enlargement of the planum sphenoidale meningioma, growing from 14 x 17 x 14 mm to size of 21 x 19 x 15 mm. Electronically Signed   By: Nelson Chimes M.D.   On: 01/26/2015 13:53   I have personally reviewed and evaluated these images and lab results as part of my medical decision-making.   EKG Interpretation   Date/Time:  Wednesday January 26 2015 09:17:11 EST Ventricular Rate:  74 PR Interval:  155 QRS Duration: 99 QT Interval:  425 QTC Calculation: 471 R Axis:   -141 Text Interpretation:  Right and left arm electrode reversal,  interpretation assumes no reversal Sinus rhythm Probable lateral infarct,  age indeterminate Confirmed by GOLDSTON  MD, SCOTT ([id number]) on 01/26/2015  2:50:00 PM      Orthostatic VS for the past 24 hrs:  BP- Lying Pulse- Lying BP- Sitting Pulse- Sitting BP- Standing at 0 minutes Pulse- Standing at 0 minutes  01/26/15 0918 145/82 mmHg 60 132/83 mmHg 74 129/75 mmHg 71     MDM   Final diagnoses:  Vertigo  Non-intractable vomiting with nausea, vomiting of unspecified type    [name] presents with a complaint of dizziness since this morning.  Findings and plan of care discussed with Sherwood Gambler,  MD.  Patient had a CBC and BMP drawn yesterday with no acute changes. No indication for repeat labs to be drawn today. Pt is noted to be moving her head, able to look around the room with no apparent difficulty or increase in her symptoms. This patient's presentation could be connected with her tinnitus and previous vertigo complaints, but caution dictates that this patient receive an MRI to make sure that the patient has not had an infarct. Patient has no complaints of pain or other indications of an intracranial hemorrhage.  Patient agreed to the MRI, but requested "something for anxiety." Pt states that she has had trouble with anxiety during MRI's before. Dr. Regenia Skeeter recommended 1mg  Ativan PO. Patient was reassessed multiple times during her time in the ED with improvement voice by the patient and both her dizziness and her nausea. Patient even asked for some food and ate without any issues. Patient's MRI was free from any acute abnormalities. Patient ambulated without any issues. Patient states that she is ready to go home and that her dizziness has resolved. Patient was encouraged to keep her appointment with ENT on Friday. Patient was also given return precautions. Patient voiced understanding of these instructions and is comfortable with discharge.  Filed Vitals:   01/26/15 1207 01/26/15 1208 01/26/15 1216 01/26/15 1230  BP: 122/89  122/89 134/75  Pulse:  68 66 71  Temp:   98.1 F (36.7 C)   TempSrc:   Temporal   Resp:  17 21 23   SpO2:  100% 97% 98%     Lorayne Bender, PA-C 01/26/15 Arrey, MD 01/27/15 1021

## 2015-01-26 NOTE — ED Notes (Signed)
Walked patient to the bathroom at 9:30 am with little assistance. Walked her again at 2:30 pm with no assistance

## 2015-01-26 NOTE — Progress Notes (Signed)
Internal Medicine Clinic Attending  I saw and evaluated the patient.  I personally confirmed the key portions of the history and exam documented by Dr. Lovena Le and I reviewed pertinent patient test results.  The assessment, diagnosis, and plan were formulated together and I agree with the documentation in the resident's note.  Left sided hearing loss is stable. Tinnitus is new, and I agree with plan for coping and ENT referral.

## 2015-01-26 NOTE — Discharge Instructions (Signed)
You have been seen today for dizziness and tinnitus. Your imaging and lab tests showed no abnormalities. Follow up with PCP as needed. Return to ED should symptoms worsen.

## 2015-01-26 NOTE — ED Notes (Addendum)
Pt from home via [facility] with c/o worsening vertigo this am.  Pt reports N/V, weakness, and dizziness.  Pt was seen 2 days ago for the same, and again last night for the same, discharged both times without medication.  Given 4 mg zofran.  Pt in NAD, A&O.

## 2015-01-28 LAB — URINE CULTURE

## 2015-02-15 ENCOUNTER — Ambulatory Visit: Payer: Medicare Other | Admitting: Internal Medicine

## 2015-02-24 ENCOUNTER — Other Ambulatory Visit: Payer: Self-pay | Admitting: Pulmonary Disease

## 2015-03-01 ENCOUNTER — Encounter: Payer: Self-pay | Admitting: Internal Medicine

## 2015-03-01 ENCOUNTER — Ambulatory Visit (INDEPENDENT_AMBULATORY_CARE_PROVIDER_SITE_OTHER): Payer: Medicare Other | Admitting: Internal Medicine

## 2015-03-01 VITALS — BP 139/78 | HR 62 | Temp 98.2°F | Ht 62.0 in | Wt 166.0 lb

## 2015-03-01 DIAGNOSIS — D329 Benign neoplasm of meninges, unspecified: Secondary | ICD-10-CM

## 2015-03-01 DIAGNOSIS — N184 Chronic kidney disease, stage 4 (severe): Secondary | ICD-10-CM

## 2015-03-01 DIAGNOSIS — I129 Hypertensive chronic kidney disease with stage 1 through stage 4 chronic kidney disease, or unspecified chronic kidney disease: Secondary | ICD-10-CM | POA: Diagnosis not present

## 2015-03-01 DIAGNOSIS — F329 Major depressive disorder, single episode, unspecified: Secondary | ICD-10-CM

## 2015-03-01 DIAGNOSIS — M109 Gout, unspecified: Secondary | ICD-10-CM

## 2015-03-01 DIAGNOSIS — Z8673 Personal history of transient ischemic attack (TIA), and cerebral infarction without residual deficits: Secondary | ICD-10-CM

## 2015-03-01 DIAGNOSIS — I10 Essential (primary) hypertension: Secondary | ICD-10-CM

## 2015-03-01 DIAGNOSIS — M1A0220 Idiopathic chronic gout, left elbow, without tophus (tophi): Secondary | ICD-10-CM | POA: Diagnosis not present

## 2015-03-01 DIAGNOSIS — F32A Depression, unspecified: Secondary | ICD-10-CM

## 2015-03-01 NOTE — Assessment & Plan Note (Signed)
-   Patient is much improved with no further tinnitus - patient did f/u with [facility] - noted to have sensorineural hearing loss - Patient now wearing her hearing aids - Patient states that she believes the tinnitus was secondary to her pain meds and now that she is off them it has improved

## 2015-03-01 NOTE — Assessment & Plan Note (Signed)
-   Patient with no acute flares - On allopurinol 100 mg. Will check uric acid level today - Will d/c colchicine if uric acid level at goal

## 2015-03-01 NOTE — Assessment & Plan Note (Signed)
-   Last BMP done in Jan with stable creatinine and GFR - Being followed by Dr. Moshe Cipro   - No need for repeat BMP at this time

## 2015-03-01 NOTE — Assessment & Plan Note (Signed)
BP Readings from Last 3 Encounters:  03/01/15 139/78  01/26/15 134/78  01/25/15 159/93    Lab Results  Component Value Date   NA 143 01/26/2015   K 4.5 01/26/2015   CREATININE 2.10* 01/26/2015    Assessment: Blood pressure control:  well controlled Progress toward BP goal:   at goal Comments: Patient is on amlodipine, lasix and atenolol  Plan: Medications:  continue current medications Educational resources provided: brochure (denies need) Self management tools provided:   Other plans:

## 2015-03-01 NOTE — Assessment & Plan Note (Signed)
-   Much improved - Patient has stopped taking her bupropion and is doing well off it - Will d/c bupropiion

## 2015-03-01 NOTE — Assessment & Plan Note (Signed)
-   Patient states that she has known about this diagnosis for a long time - States she follows with a neurosurgeon for this and has an appointment coming up - Patient will attempt to get name of neurosurgeon and let me know - Patient remains asymptomatic. Will follow

## 2015-03-01 NOTE — Patient Instructions (Signed)
-   It was a pleasure seeing you today - I will check your cholesterol and uric acid levels today and call you with the results - We will stop the omeprazole and the bupropion today - Please call me with any issues - Please follow up with your neurosurgeon for the meningioma (benign brain mass)

## 2015-03-01 NOTE — Progress Notes (Signed)
   Subjective:    Patient ID: [name], female    DOB: 1934-04-23, 81 y.o.   MRN: [id number]  HPI Patient seen and examined. Patient is here for routine follow up of her HTN and gout. Patient feels well currently with no new complaints. Has had no recent gout flares and is on colchicine and allopurinol. She also has CKD and her creatinine has been stable. She follows up with Dr. Moshe Cipro once a year    Review of Systems  Constitutional: Negative.   HENT: Negative.   Eyes: Negative.   Respiratory: Negative.   Cardiovascular: Negative.   Gastrointestinal: Negative.   Musculoskeletal: Negative.  Negative for back pain, arthralgias and gait problem.  Skin: Negative.   Neurological: Negative.  Negative for dizziness, numbness and headaches.  Psychiatric/Behavioral: Negative.        Objective:   Physical Exam  Constitutional: She is oriented to person, place, and time. She appears well-developed and well-nourished.  HENT:  Head: Normocephalic and atraumatic.  Cardiovascular: Normal rate, regular rhythm and normal heart sounds.   Pulmonary/Chest: Effort normal and breath sounds normal. No respiratory distress. She has no wheezes.  Abdominal: Soft. Bowel sounds are normal. She exhibits no distension. There is no tenderness.  Musculoskeletal: Normal range of motion. She exhibits no edema or tenderness.  Neurological: She is alert and oriented to person, place, and time.  Skin: Skin is warm and dry. No rash noted. No erythema.  Psychiatric: She has a normal mood and affect. Her behavior is normal.          Assessment & Plan:  Please see problem based charting for assessment and plan:

## 2015-03-02 LAB — URIC ACID: Uric Acid: 7.2 mg/dL — ABNORMAL HIGH (ref 2.5–7.1)

## 2015-03-03 ENCOUNTER — Telehealth: Payer: Self-pay | Admitting: Internal Medicine

## 2015-03-03 MED ORDER — ALLOPURINOL 100 MG PO TABS: 150.0000 mg | ORAL_TABLET | Freq: Every day | ORAL | Status: DC

## 2015-03-03 NOTE — Telephone Encounter (Signed)
Called patient to discuss uric acid results. Her uric acid is not at goal. Will increase allopurinol to 150 mg and recheck uric acid on next visit. She is in agreement with plan. Will attempt to d/c colchicine on next visit if no further titration of allopurinol is required.  Patient also to call me back with name of neurosurgeon she follows with for meningioma.

## 2015-03-07 ENCOUNTER — Other Ambulatory Visit: Payer: Self-pay | Admitting: Internal Medicine

## 2015-03-14 ENCOUNTER — Other Ambulatory Visit: Payer: Self-pay | Admitting: Internal Medicine

## 2015-03-15 ENCOUNTER — Telehealth: Payer: Self-pay | Admitting: Internal Medicine

## 2015-03-15 NOTE — Telephone Encounter (Signed)
Pt is calling to give dr Dareen Piano the information for the dr who did the scan on her head. Is is Dr Quita Skye Phone number is [phone]

## 2015-03-15 NOTE — Telephone Encounter (Signed)
Thank you :)

## 2015-03-22 ENCOUNTER — Other Ambulatory Visit: Payer: Self-pay | Admitting: Internal Medicine

## 2015-03-22 MED ORDER — MELATONIN 1 MG PO TABS: 0.5000 mg | ORAL_TABLET | Freq: Every evening | ORAL | Status: DC | PRN

## 2015-03-22 NOTE — Telephone Encounter (Signed)
Pt would like for you to please call her asap, she is going to have surgery soon and wants to speak w/ you, you do not have an appt open until 5/2, she is in that slot but wants to speak with you asap

## 2015-03-22 NOTE — Telephone Encounter (Signed)
I discussed her insomnia with her and gave her advice on sleep hygiene as well as telling her to take 1/2 a tablet of melatonin at night before bed. She is in agreement with plan

## 2015-03-22 NOTE — Telephone Encounter (Signed)
Pt would like a rx for sleeping pill.

## 2015-03-22 NOTE — Telephone Encounter (Signed)
We haven't discussed insomnia before and I am hesitant to prescribe her something without discussing it with her further.

## 2015-03-25 ENCOUNTER — Other Ambulatory Visit: Payer: Self-pay | Admitting: Internal Medicine

## 2015-03-29 ENCOUNTER — Encounter: Payer: Medicare Other | Admitting: Internal Medicine

## 2015-04-11 ENCOUNTER — Other Ambulatory Visit: Payer: Self-pay | Admitting: Internal Medicine

## 2015-04-11 DIAGNOSIS — M1A3220 Chronic gout due to renal impairment, left elbow, without tophus (tophi): Secondary | ICD-10-CM

## 2015-04-20 ENCOUNTER — Ambulatory Visit (INDEPENDENT_AMBULATORY_CARE_PROVIDER_SITE_OTHER): Payer: Medicare Other | Admitting: Internal Medicine

## 2015-04-20 ENCOUNTER — Encounter: Payer: Self-pay | Admitting: Internal Medicine

## 2015-04-20 VITALS — BP 137/85 | HR 69 | Ht 62.0 in | Wt 169.7 lb

## 2015-04-20 DIAGNOSIS — R5383 Other fatigue: Secondary | ICD-10-CM | POA: Diagnosis not present

## 2015-04-20 HISTORY — DX: Other fatigue: R53.83

## 2015-04-20 MED ORDER — ALLOPURINOL 100 MG PO TABS: 150.0000 mg | ORAL_TABLET | Freq: Every day | ORAL | Status: DC

## 2015-04-20 MED ORDER — BUPROPION HCL ER (XL) 150 MG PO TB24: 150.0000 mg | ORAL_TABLET | Freq: Every day | ORAL | Status: DC

## 2015-04-20 MED ORDER — ATENOLOL 25 MG PO TABS: 25.0000 mg | ORAL_TABLET | Freq: Every day | ORAL | Status: DC

## 2015-04-20 MED ORDER — CINACALCET HCL 30 MG PO TABS: 30.0000 mg | ORAL_TABLET | Freq: Every day | ORAL | Status: DC

## 2015-04-20 NOTE — Progress Notes (Signed)
Subjective:    Patient ID: [name], female    DOB: 20-Aug-1934, 81 y.o.   MRN: [id number]  HPI ANDREAH Cole is a 81 y.o. female with PMHx of HTN, Gout, CKD IV who presents to the clinic for complaint of fatigue, weakness and insomnia. Please see A&P for the status of the patient's chronic medical problems.   Past Medical History  Diagnosis Date  . Depression   . Hypertension   . Gastritis, Helicobacter pylori     Noted on EGD 01/18/2009 by [name];  biopsy showed chronic gastritis with Helicobacter pylori, treated by [name].  . Pericardial effusion 11/2004    S/P subxiphoid pericardial window 11/01/2004 by [name].  A question of possible collagen vascular disease had been raised in 2006 due to arthralgias, a history of idiopathic pericarditis, and increased pigmentation of her palms, and she was referred to [name] but he was unable to confirm a rheumatologic diagnosis.  [name] Hemorrhoids, internal 05/09/2005     Found on colonoscopy 05/09/2005 by [name]  . Cataracts, bilateral   . Pinguecula   . Vitreous detachment     "no OR"  . Thigh pain   . Anemia, iron deficiency   . Angiodysplasia of colon 05/09/2005    Single small angiodysplastic lesion in the descending colon found on colonoscopy 05/09/2005 by [name]  . Anemia due to blood loss, chronic   . Stiffness of joint, not elsewhere classified, hand   . Osteoporosis, unspecified 03/06/2012    A DXA bone density scan done 02/20/2012 showed osteoporosis with a lumbar spine T-score of -4.4 and a right femur T-score of -2.8.  Patient has chronic kidney disease with estimated GFR less than 30, and it is not clear how much of her osteoporosis may be due to renal osteodystrophy.    [name] GERD (gastroesophageal reflux disease)   . Chronic knee pain     bilateral  . Chronic ankle pain     bilateral  . Chronic thumb pain   . Renal failure     Formerly on hemodialysis; managed by Dr.  Corliss Parish  . Chronic kidney disease, stage IV (severe) ([place])     [name] 02/09/2014  . History of blood transfusion     "low blood count"  . Gout   . Epicondylitis   . Arthritis     "knots on my thumbs; left elbow" (03/03/2014)  . Stroke [facility]) 2007    "when my kidneys went out & I was in a coma; weak LUE/LLE since"    Outpatient Encounter Prescriptions as of 04/20/2015  Medication Sig  . allopurinol (ZYLOPRIM) 100 MG tablet Take 1.5 tablets (150 mg total) by mouth daily.  [name] amLODipine (NORVASC) 10 MG tablet TAKE ONE TABLET BY MOUTH AT BEDTIME  . aspirin EC 81 MG tablet Take 1 tablet (81 mg total) by mouth daily.  [name] atenolol (TENORMIN) 25 MG tablet Take 1 tablet (25 mg total) by mouth at bedtime.  [name] atorvastatin (LIPITOR) 40 MG tablet Take 1 tablet (40 mg total) by mouth daily.  [name] buPROPion (WELLBUTRIN XL) 150 MG 24 hr tablet Take 1 tablet (150 mg total) by mouth daily.  . cinacalcet (SENSIPAR) 30 MG tablet Take 1 tablet (30 mg total) by mouth daily.  . colchicine 0.6 MG tablet Take 0.5 tablets (0.3 mg total) by mouth daily.  . furosemide (LASIX) 80 MG tablet TAKE ONE-HALF TABLET BY MOUTH ONCE DAILY  . meclizine (  ANTIVERT) 12.5 MG tablet Take 12.5 mg by mouth daily.   . Melatonin 1 MG TABS Take 0.5 tablets (0.5 mg total) by mouth at bedtime as needed.  . Multiple Vitamins-Minerals (CENTRUM SILVER ADULT 50+ PO) Take 1 tablet by mouth daily.  [name] Salicylate (ASPERCREME EX) Apply 1 application topically daily as needed (hand pain).  . [DISCONTINUED] allopurinol (ZYLOPRIM) 100 MG tablet Take 1.5 tablets (150 mg total) by mouth daily.  . [DISCONTINUED] atenolol (TENORMIN) 25 MG tablet TAKE ONE TABLET BY MOUTH AT BEDTIME  . [DISCONTINUED] SENSIPAR 30 MG tablet TAKE ONE TABLET BY MOUTH ONCE DAILY   No facility-administered encounter medications on file as of 04/20/2015.    Family History  Problem Relation Age of Onset  . Hypertension Brother   . Heart disease Father   .  Diabetes Brother   . Breast cancer Neg Hx   . Colon cancer Neg Hx   . Lung cancer Neg Hx   . Cervical cancer Neg Hx   . Sickle cell anemia Neg Hx     Social History   Social History  . Marital Status: Legally Separated    Spouse Name: N/A  . Number of Children: N/A  . Years of Education: N/A   Occupational History  . Not on file.   Social History Main Topics  . Smoking status: Former Smoker -- 0.10 packs/day for 1 years    Types: Cigarettes    Quit date: 01/02/1960  . Smokeless tobacco: Never Used  . Alcohol Use: No  . Drug Use: No  . Sexual Activity: Not on file   Other Topics Concern  . Not on file   Social History Narrative   Review of Systems General: Admits to fatigue, decreased appetite. Denies fever, chills, and diaphoresis.  Respiratory: Denies SOB, cough, DOE, sleep apnea. Cardiovascular: Denies chest pain and palpitations.  Gastrointestinal: Denies nausea, vomiting, abdominal pain, diarrhea, constipation Genitourinary: Denies dysuria, urgency Neurological: Admits to overall weakness. Denies dizziness, headaches, lightheadedness Psychiatric/Behavioral: Admits to depression, insomnia. Denies nervousness, and agitation.     Objective:   Physical Exam Filed Vitals:   04/20/15 1455  BP: 137/85  Pulse: 69  Height: 5\' 2"  (1.575 m)  Weight: 169 lb 11.2 oz (76.975 kg)  SpO2: 99%   General: Vital signs reviewed.  Patient is well-developed and well-nourished, in no acute distress and cooperative with exam.  Neck: Supple, trachea midline, no thyromegaly.  Cardiovascular: RRR, S1 normal, S2 normal, no murmurs, gallops, or rubs. Pulmonary/Chest: Clear to auscultation bilaterally, no wheezes, rales, or rhonchi. Abdominal: Soft, non-tender, non-distended, BS + Extremities: No lower extremity edema bilaterally Neurological: A&O x3, Strength is normal and symmetric bilaterally Skin: Warm, dry and intact.  Psychiatric: Normal mood and affect. speech and behavior is  normal. Cognition and memory are normal.     Assessment & Plan:   Please see problem based assessment and plan.

## 2015-04-20 NOTE — Assessment & Plan Note (Addendum)
Patient admits to a one week history of fatigue, weakness and worsening insomnia. She has a long history of insomnia for which she takes benadryl which helps. REcently, she tried melatonin which only seemed to have helped for 2 nights. She denies any new stressors to worsen her insomnia. She feels more fatigued during the day than usual. She frequently naps without feeling refreshed. She feels overall weaker and relates this to her fatigue. She denies fever, chills, night sweats. She denies history of OSA, snoring or morning headaches. She denies history of thyroid problems- weight gain, weight loss, feeling cold or dry. She denies any blood in her urine or stool. She denies any new medications. She did recently stop her long-term Bupropion XL 150 mg as she felt her depression had gone away. Now she admits to increased depression, trouble sleeping, fatigue and loss of interest in things she used to do. PHQ-9 score was 14 indicating moderate depression. Patient is willing to restart Bupropion.   Assessment: Likely Fatigue 2/2 Depression  Plan: -Restart Bupropion XL 150 mg QD -Follow up one month with PCP -Will check CBC to assess for anemia -Will check TSH to rule out Hypothyroidism -Will check BMET to assess for worsening kidney function  Addendum: TSH normal. BUN slightly increased, Creatinine stable. Hemoglobin stable at 12.

## 2015-04-20 NOTE — Patient Instructions (Signed)
RESTART BUPROPRION 150 MG ONE TABLET IN THE MORNING.  CONTINUE ALL MEDICATIONS THE SAME.   FOLLOW UP WITH DR. Dareen Piano.  I WILL CALL YOU WITH YOUR LAB RESULTS.

## 2015-04-20 NOTE — Progress Notes (Signed)
Medicine attending: Medical history, presenting problems, physical findings, and medications, reviewed with resident physician Dr Alexa Burns on the day of the patient visit and I concur with her evaluation and management plan. 

## 2015-04-21 LAB — BMP8+ANION GAP
Anion Gap: 18 mmol/L (ref 10.0–18.0)
BUN/Creatinine Ratio: 17 (ref 12–28)
BUN: 37 mg/dL — ABNORMAL HIGH (ref 8–27)
CO2: 24 mmol/L (ref 18–29)
Calcium: 9.6 mg/dL (ref 8.7–10.3)
Chloride: 100 mmol/L (ref 96–106)
Creatinine, Ser: 2.14 mg/dL — ABNORMAL HIGH (ref 0.57–1.00)
GFR calc Af Amer: 25 mL/min/{1.73_m2} — ABNORMAL LOW (ref >59)
GFR calc non Af Amer: 21 mL/min/{1.73_m2} — ABNORMAL LOW (ref >59)
Glucose: 94 mg/dL (ref 65–99)
Potassium: 5.2 mmol/L (ref 3.5–5.2)
Sodium: 142 mmol/L (ref 134–144)

## 2015-04-21 LAB — CBC
Hematocrit: 37.1 % (ref 34.0–46.6)
Hemoglobin: 12.2 g/dL (ref 11.1–15.9)
MCH: 30.8 pg (ref 26.6–33.0)
MCHC: 32.9 g/dL (ref 31.5–35.7)
MCV: 94 fL (ref 79–97)
Platelets: 222 10*3/uL (ref 150–379)
RBC: 3.96 x10E6/uL (ref 3.77–5.28)
RDW: 15.1 % (ref 12.3–15.4)
WBC: 5.2 10*3/uL (ref 3.4–10.8)

## 2015-04-21 LAB — TSH: TSH: 3.26 u[IU]/mL (ref 0.450–4.500)

## 2015-05-03 ENCOUNTER — Ambulatory Visit (INDEPENDENT_AMBULATORY_CARE_PROVIDER_SITE_OTHER): Payer: Medicare Other | Admitting: Internal Medicine

## 2015-05-03 ENCOUNTER — Encounter: Payer: Self-pay | Admitting: Internal Medicine

## 2015-05-03 VITALS — BP 122/60 | HR 61 | Temp 98.2°F | Ht 62.0 in | Wt 167.0 lb

## 2015-05-03 DIAGNOSIS — G4700 Insomnia, unspecified: Secondary | ICD-10-CM

## 2015-05-03 DIAGNOSIS — F329 Major depressive disorder, single episode, unspecified: Secondary | ICD-10-CM | POA: Diagnosis not present

## 2015-05-03 DIAGNOSIS — I10 Essential (primary) hypertension: Secondary | ICD-10-CM | POA: Diagnosis not present

## 2015-05-03 DIAGNOSIS — M10022 Idiopathic gout, left elbow: Secondary | ICD-10-CM | POA: Diagnosis not present

## 2015-05-03 DIAGNOSIS — K219 Gastro-esophageal reflux disease without esophagitis: Secondary | ICD-10-CM | POA: Insufficient documentation

## 2015-05-03 DIAGNOSIS — F32A Depression, unspecified: Secondary | ICD-10-CM

## 2015-05-03 DIAGNOSIS — M1A0220 Idiopathic chronic gout, left elbow, without tophus (tophi): Secondary | ICD-10-CM

## 2015-05-03 MED ORDER — MECLIZINE HCL 12.5 MG PO TABS: 12.5000 mg | ORAL_TABLET | Freq: Every day | ORAL | Status: DC

## 2015-05-03 MED ORDER — OMEPRAZOLE 20 MG PO CPDR: 20.0000 mg | DELAYED_RELEASE_CAPSULE | Freq: Two times a day (BID) | ORAL | Status: DC

## 2015-05-03 MED ORDER — TRAZODONE HCL 50 MG PO TABS: 25.0000 mg | ORAL_TABLET | Freq: Every day | ORAL | Status: DC

## 2015-05-03 NOTE — Assessment & Plan Note (Signed)
-   Patient had stopped PPI as her symptoms had resolved - She now complains of recurrent reflux symptoms - Will restart PPI and monitor

## 2015-05-03 NOTE — Assessment & Plan Note (Signed)
-   No recent gout flares - Allopurinol increased to 150 mg on her last visit secondary to elevated uric acid levels - Will f/u repeat uric acid today and consider increasing it further if elevated - If uric acid level at goal will d/c colchicine and continue with allopurinol alone

## 2015-05-03 NOTE — Progress Notes (Signed)
   Subjective:    Patient ID: [name], female    DOB: 1935/01/01, 81 y.o.   MRN: [id number]  HPI Patient seen and examined. She is here for routine follow up of her insomnia, HTN and gout. She denies any recent gout flares. Symptoms are well controlled on current regimen. She does complain of difficulty sleeping and states that she only gets 3 hours of sleep a night. She feels fatigued during the rest of the day. She was prescribed melatonin which did not help. She also had 1 episode of epigastric pain yesterday - felt like gas, no relation exertion, relieved after burping once. No recurrence   Review of Systems  Constitutional: Positive for fatigue. Negative for fever, diaphoresis, activity change, appetite change and unexpected weight change.  HENT: Negative.   Respiratory: Negative.   Cardiovascular: Negative.   Gastrointestinal: Negative.   Musculoskeletal: Negative.   Skin: Negative.   Neurological: Negative.   Psychiatric/Behavioral: Negative.        Objective:   Physical Exam  Constitutional: She is oriented to person, place, and time. She appears well-developed and well-nourished.  HENT:  Head: Normocephalic and atraumatic.  Cardiovascular: Normal rate and regular rhythm.   No murmur heard. Pulmonary/Chest: Effort normal and breath sounds normal.  Abdominal: Soft. Bowel sounds are normal.  Musculoskeletal: Normal range of motion. She exhibits no edema or tenderness.  Neurological: She is alert and oriented to person, place, and time.  Skin: Skin is warm. No rash noted. No erythema.  Psychiatric: She has a normal mood and affect. Her behavior is normal.          Assessment & Plan:  Please see problem based charting for assessment and plan:

## 2015-05-03 NOTE — Assessment & Plan Note (Signed)
BP Readings from Last 3 Encounters:  05/03/15 122/60  04/20/15 137/85  03/01/15 139/78    Lab Results  Component Value Date   NA 142 04/20/2015   K 5.2 04/20/2015   CREATININE 2.14* 04/20/2015    Assessment: Blood pressure control:   well controlled Progress toward BP goal:  at goal Comments: compliant with amlodipine, atenolol and lasix  Plan: Medications:  continue current medications Educational resources provided: brochure (denies) Self management tools provided:   Other plans:

## 2015-05-03 NOTE — Patient Instructions (Signed)
-   It was a pleasure seeing you today - I have restarted prilosec for your reflux and meclizine for your dizziness - Will check your uric acid level today - I have prescribed low dos of trazodone for your insomnia - Please follow up in 3 months

## 2015-05-03 NOTE — Assessment & Plan Note (Signed)
-   Patient did complain of symptoms of depression on her last visit with Dr. Quay Burow - Was restarted on her bupropion - States her symptoms have improved and she will continue with current meds

## 2015-05-03 NOTE — Assessment & Plan Note (Signed)
-   Patient with good sleep hygiene but still c/o bad insomnia - Was tried on melatonin with no improvement - Will start low dose trazadone 25 mg

## 2015-05-04 ENCOUNTER — Telehealth: Payer: Self-pay | Admitting: Internal Medicine

## 2015-05-04 LAB — URIC ACID: Uric Acid: 5.1 mg/dL (ref 2.5–7.1)

## 2015-05-04 NOTE — Telephone Encounter (Signed)
Spoke with patient about uric acid levels. Uric acid levels now normal. Will d/c colchicine and c/w allopurinol. Recheck uric acid levels on next visit

## 2015-05-05 ENCOUNTER — Telehealth: Payer: Self-pay | Admitting: *Deleted

## 2015-05-05 NOTE — Telephone Encounter (Signed)
Patient called to verify that she was supposed to stop taking her colchicine.

## 2015-08-03 ENCOUNTER — Telehealth: Payer: Self-pay | Admitting: *Deleted

## 2015-08-03 DIAGNOSIS — I10 Essential (primary) hypertension: Secondary | ICD-10-CM

## 2015-08-03 NOTE — Telephone Encounter (Signed)
Received fax from [facility] - Atenolol 25 mg is on mfg back order and will not be available until last of August. And pt is completely out. Please advise. Thanks

## 2015-08-03 NOTE — Telephone Encounter (Signed)
Is it possible to get at another pharmacy?

## 2015-08-03 NOTE — Telephone Encounter (Signed)
[facility]; [facility] stated it's on back order [facility].

## 2015-08-04 MED ORDER — METOPROLOL TARTRATE 25 MG PO TABS: 25.0000 mg | ORAL_TABLET | Freq: Two times a day (BID) | ORAL | 3 refills | Status: DC

## 2015-08-04 NOTE — Telephone Encounter (Signed)
I discontinued the atenolol and prescribed metoprolol 25 mg twice daily which is the equivalent dose.  Please call the patient to notify her of the change.  Thanks.

## 2015-08-04 NOTE — Telephone Encounter (Signed)
Pt called / informed of new metoprolol rx.

## 2015-08-11 DIAGNOSIS — Z961 Presence of intraocular lens: Secondary | ICD-10-CM | POA: Diagnosis not present

## 2015-08-13 ENCOUNTER — Other Ambulatory Visit: Payer: Self-pay | Admitting: Neurosurgery

## 2015-08-13 DIAGNOSIS — D329 Benign neoplasm of meninges, unspecified: Secondary | ICD-10-CM

## 2015-08-18 ENCOUNTER — Other Ambulatory Visit: Payer: Self-pay | Admitting: Internal Medicine

## 2015-08-30 ENCOUNTER — Other Ambulatory Visit: Payer: Self-pay | Admitting: Internal Medicine

## 2015-08-31 NOTE — Telephone Encounter (Signed)
Per pt's pcp "She needs an appointment with me. Unsure why she continues to need meclizine"  Please schedule next avail with pcp.Regenia Skeeter, Darlene Cassady8/30/201710:13 AM

## 2015-09-05 ENCOUNTER — Other Ambulatory Visit: Payer: Self-pay | Admitting: Internal Medicine

## 2015-09-12 ENCOUNTER — Other Ambulatory Visit: Payer: Self-pay | Admitting: Internal Medicine

## 2015-09-13 ENCOUNTER — Ambulatory Visit (HOSPITAL_COMMUNITY)
Admission: RE | Admit: 2015-09-13 | Discharge: 2015-09-13 | Disposition: A | Discharge status: Home / Self Care | Payer: Medicare Other | Source: Ambulatory Visit | Attending: Neurosurgery | Admitting: Neurosurgery

## 2015-09-13 DIAGNOSIS — D329 Benign neoplasm of meninges, unspecified: Secondary | ICD-10-CM

## 2015-09-13 MED ORDER — DIAZEPAM 5 MG PO TABS
ORAL_TABLET | ORAL | Status: AC
  Filled 2015-09-13: qty 1

## 2015-09-13 MED ORDER — DIAZEPAM 5 MG PO TABS
5.0000 mg | ORAL_TABLET | Freq: Once | ORAL | Status: AC
  Administered 2015-09-13: 5 mg via ORAL

## 2015-09-22 DIAGNOSIS — D329 Benign neoplasm of meninges, unspecified: Secondary | ICD-10-CM | POA: Diagnosis not present

## 2015-09-27 ENCOUNTER — Encounter: Payer: Self-pay | Admitting: Internal Medicine

## 2015-09-27 ENCOUNTER — Ambulatory Visit (INDEPENDENT_AMBULATORY_CARE_PROVIDER_SITE_OTHER): Payer: Medicare Other | Admitting: Internal Medicine

## 2015-09-27 DIAGNOSIS — X58XXXA Exposure to other specified factors, initial encounter: Secondary | ICD-10-CM | POA: Diagnosis not present

## 2015-09-27 DIAGNOSIS — S46211A Strain of muscle, fascia and tendon of other parts of biceps, right arm, initial encounter: Secondary | ICD-10-CM

## 2015-09-27 DIAGNOSIS — S46911A Strain of unspecified muscle, fascia and tendon at shoulder and upper arm level, right arm, initial encounter: Secondary | ICD-10-CM | POA: Insufficient documentation

## 2015-09-27 MED ORDER — OXYCODONE-ACETAMINOPHEN 5-325 MG PO TABS: 1.0000 | ORAL_TABLET | Freq: Three times a day (TID) | ORAL | 0 refills | Status: DC | PRN

## 2015-09-27 NOTE — Progress Notes (Signed)
Internal Medicine Clinic Attending  I saw and evaluated the patient.  I personally confirmed the key portions of the history and exam documented by Dr. Wynetta Emery and I reviewed pertinent patient test results.  The assessment, diagnosis, and plan were formulated together and I agree with the documentation in the resident's note.  Prior to Rx narcotics, we reviewed [place] narcotic database which showed no controlled substance Rx for the last 6 months.  She uses sparingly due to frequent use injury.  She is exercising with yoga.  Acute Rx of < 7 day supply was provided.

## 2015-09-27 NOTE — Progress Notes (Addendum)
CC: Right arm pain  HPI:  [name] is a 81 y.o. female with PMHx detailed below presenting with right bicep muscle pain.  See problem based assessment and plan below for additional details.  Past Medical History:  Diagnosis Date  . Anemia due to blood loss, chronic   . Anemia, iron deficiency   . Angiodysplasia of colon 05/09/2005   Single small angiodysplastic lesion in the descending colon found on colonoscopy 05/09/2005 by [name]  . Arthritis    "knots on my thumbs; left elbow" (03/03/2014)  . Cataracts, bilateral   . Chronic ankle pain    bilateral  . Chronic kidney disease, stage IV (severe) ([place])    [name] 02/09/2014  . Chronic knee pain    bilateral  . Chronic thumb pain   . Depression   . Epicondylitis   . Gastritis, Helicobacter pylori    Noted on EGD 01/18/2009 by [name];  biopsy showed chronic gastritis with Helicobacter pylori, treated by [name].  [name] GERD (gastroesophageal reflux disease)   . Gout   . Hemorrhoids, internal 05/09/2005    Found on colonoscopy 05/09/2005 by [name]  . History of blood transfusion    "low blood count"  . Hypertension   . Osteoporosis, unspecified 03/06/2012   A DXA bone density scan done 02/20/2012 showed osteoporosis with a lumbar spine T-score of -4.4 and a right femur T-score of -2.8.  Patient has chronic kidney disease with estimated GFR less than 30, and it is not clear how much of her osteoporosis may be due to renal osteodystrophy.    . Pericardial effusion 11/2004   S/P subxiphoid pericardial window 11/01/2004 by [name].  A question of possible collagen vascular disease had been raised in 2006 due to arthralgias, a history of idiopathic pericarditis, and increased pigmentation of her palms, and she was referred to [name] but he was unable to confirm a rheumatologic diagnosis.  . Pinguecula   . Renal failure    Formerly on hemodialysis; managed by Dr. Corliss Parish  . Stiffness of joint, not elsewhere classified, hand   . Stroke [facility]) 2007   "when my kidneys went out & I was in a coma; weak LUE/LLE since"  . Thigh pain   . Vitreous detachment    "no OR"    Review of Systems: Review of Systems  Constitutional: Negative for chills and fever.  HENT: Negative for congestion.   Respiratory: Negative for cough and shortness of breath.   Cardiovascular: Negative for chest pain.  Gastrointestinal: Negative for abdominal pain.  Musculoskeletal: Positive for joint pain and myalgias. Negative for back pain.  Neurological: Negative for headaches.     Physical Exam: Vitals:   09/27/15 0835  BP: 139/74  Pulse: 80  Temp: 97.8 F (36.6 C)  TempSrc: Oral  SpO2: 97%  Weight: 167 lb 12.8 oz (76.1 kg)   Body mass index is 30.69 kg/m. GENERAL- Well-dressed elderly woman sitting comfortably in exam room chair, alert, in no distress HEENT- Atraumatic, moist mucous membranes CARDIAC- Regular rate and rhythm, no murmurs, rubs or gallops. RESP- Clear to ascultation bilaterally, no wheezing or crackles, normal work of breathing BACK- Kyphotic curvature, no paraspinal tenderness EXTREMITIES- Normal bulk and range of motion, biceps muscle moderately tender to palpation, pain with active movement, none with passive, no edema, 2+ peripheral pulses SKIN- Warm, dry, intact, without visible rash PSYCH- Appropriate affect, clear speech, thoughts linear and goal-directed  Assessment &  Plan:   See encounters tab for problem based medical decision making.  Patient seen with Dr. Daryll Drown

## 2015-09-27 NOTE — Patient Instructions (Signed)
Thank you for visiting our clinic today!  Giving your right arm several days to rest should help the muscle heal.  Also stretching the muscles in that arm often can help.  We have provided a prescription that you can continue to take a few times a week as needed when the pain is severe.

## 2015-09-27 NOTE — Assessment & Plan Note (Signed)
Reports moderate to severe intermittent pain in her right biceps muscle that has persisted since March 2017. This pain interferes with her ability to work in [place], pain elicited often with sweeping, vacuuming. Her pain has been refractory to scheduled Tylenol and patient cannot safely take NSAID due to CKD stage 4. The patient is very physically active outside of work and practices yoga often. She has received limited prescriptions for Percocet in the past and has been responsibly taking this medication once or twice a week when her pain is uncontrolled at work. [place] controlled substance database checked and she has had no narcotic prescriptions filled in over 6 months. On exam has moderate right biceps tenderness to palpation and with active movement, 4/5 strength limited by pain.   Plan: - Encouraged biceps rest and stretching exercises - Encouraged continued use of Tylenol - Provided prescription for Percocet 5-325mg  20 tablets to be taken sparingly and as needed for severe pain

## 2015-10-11 ENCOUNTER — Encounter: Payer: Self-pay | Admitting: Internal Medicine

## 2015-10-11 ENCOUNTER — Ambulatory Visit (INDEPENDENT_AMBULATORY_CARE_PROVIDER_SITE_OTHER): Payer: Medicare Other | Admitting: Internal Medicine

## 2015-10-11 VITALS — BP 126/76 | HR 77 | Temp 98.1°F | Ht 62.0 in | Wt 167.5 lb

## 2015-10-11 DIAGNOSIS — I129 Hypertensive chronic kidney disease with stage 1 through stage 4 chronic kidney disease, or unspecified chronic kidney disease: Secondary | ICD-10-CM

## 2015-10-11 DIAGNOSIS — Z87891 Personal history of nicotine dependence: Secondary | ICD-10-CM

## 2015-10-11 DIAGNOSIS — S46911A Strain of unspecified muscle, fascia and tendon at shoulder and upper arm level, right arm, initial encounter: Secondary | ICD-10-CM

## 2015-10-11 DIAGNOSIS — N184 Chronic kidney disease, stage 4 (severe): Secondary | ICD-10-CM | POA: Diagnosis not present

## 2015-10-11 DIAGNOSIS — Z09 Encounter for follow-up examination after completed treatment for conditions other than malignant neoplasm: Secondary | ICD-10-CM

## 2015-10-11 DIAGNOSIS — S46911D Strain of unspecified muscle, fascia and tendon at shoulder and upper arm level, right arm, subsequent encounter: Secondary | ICD-10-CM

## 2015-10-11 DIAGNOSIS — M10022 Idiopathic gout, left elbow: Secondary | ICD-10-CM | POA: Diagnosis not present

## 2015-10-11 DIAGNOSIS — G4700 Insomnia, unspecified: Secondary | ICD-10-CM

## 2015-10-11 DIAGNOSIS — M1A3220 Chronic gout due to renal impairment, left elbow, without tophus (tophi): Secondary | ICD-10-CM | POA: Diagnosis not present

## 2015-10-11 DIAGNOSIS — F32A Depression, unspecified: Secondary | ICD-10-CM

## 2015-10-11 DIAGNOSIS — I10 Essential (primary) hypertension: Secondary | ICD-10-CM

## 2015-10-11 DIAGNOSIS — Z79899 Other long term (current) drug therapy: Secondary | ICD-10-CM

## 2015-10-11 DIAGNOSIS — D329 Benign neoplasm of meninges, unspecified: Secondary | ICD-10-CM

## 2015-10-11 DIAGNOSIS — X58XXXD Exposure to other specified factors, subsequent encounter: Secondary | ICD-10-CM

## 2015-10-11 DIAGNOSIS — F329 Major depressive disorder, single episode, unspecified: Secondary | ICD-10-CM

## 2015-10-11 DIAGNOSIS — Z86011 Personal history of benign neoplasm of the brain: Secondary | ICD-10-CM

## 2015-10-11 NOTE — Assessment & Plan Note (Signed)
BP Readings from Last 3 Encounters:  10/11/15 126/76  09/27/15 139/74  05/03/15 122/60    Lab Results  Component Value Date   NA 142 04/20/2015   K 5.2 04/20/2015   CREATININE 2.14 (H) 04/20/2015    Assessment: Blood pressure control:  well controlled Progress toward BP goal:   at goal Comments: complaint with lasix 8o mg, amlodipine 10 mg  Plan: Medications:  continue current medications Educational resources provided: brochure (denies need ) Self management tools provided: home blood pressure logbook (denies need ) Other plans: Will check BMP today

## 2015-10-11 NOTE — Assessment & Plan Note (Signed)
-   Patient has not followed up with Dr. Moshe Cipro in "awhile" - Will check BMP today - Patient to follow up with nephrology soon. States she will call and make an appointment

## 2015-10-11 NOTE — Assessment & Plan Note (Signed)
-   Depression symptoms are well controlled now - Continue with bupropion at the current dose

## 2015-10-11 NOTE — Progress Notes (Signed)
   Subjective:    Patient ID: [name], female    DOB: Aug 08, 1934, 81 y.o.   MRN: [id number]  HPI  I have seen and evaluated the patient. She is here for routine follow up of her HTN and gout. She was recently evaluated in [facility] for R sided biceps pain and was given a short course of percocet which has alleviated the pain. She feels well currently. She has no acute complaints today    Review of Systems  Constitutional: Negative.   HENT: Negative.   Respiratory: Negative.   Cardiovascular: Negative.   Gastrointestinal: Negative.   Musculoskeletal: Negative.   Neurological: Negative.   Psychiatric/Behavioral: Negative.        Objective:   Physical Exam  Constitutional: She is oriented to person, place, and time. She appears well-developed and well-nourished.  HENT:  Head: Normocephalic and atraumatic.  Mouth/Throat: No oropharyngeal exudate.  Neck: Normal range of motion. Neck supple.  Cardiovascular: Normal rate, regular rhythm and normal heart sounds.   Pulmonary/Chest: Effort normal and breath sounds normal. No respiratory distress. She has no wheezes.  Abdominal: Soft. Bowel sounds are normal. She exhibits no distension. There is no tenderness.  Musculoskeletal: Normal range of motion. She exhibits no edema.  Lymphadenopathy:    She has no cervical adenopathy.  Neurological: She is alert and oriented to person, place, and time.  Skin: Skin is warm. No rash noted. No erythema.  Psychiatric: She has a normal mood and affect. Her behavior is normal.          Assessment & Plan:   Please see problem based charting for assessment and plan:

## 2015-10-11 NOTE — Assessment & Plan Note (Signed)
-   Pain has now resolved - She states that she used percocet once every 3-4 days if the pain gets bad but has not needed it recently - No further work up for now - Will follow up on next visit

## 2015-10-11 NOTE — Assessment & Plan Note (Signed)
-   Patient states she recently followed up with her neurosurgeon - Dr. Ashok Pall - Will obtain records from him - Per patient she had a recent scan and that "everything is ok"

## 2015-10-11 NOTE — Assessment & Plan Note (Signed)
-   States her insomnia is now better - She uses trazadone at night and is doing well with this medication - Will continue for now

## 2015-10-11 NOTE — Patient Instructions (Addendum)
-   It was a pleasure seeing you today - We will check your bloodwork today - Your BP is well controlled. Keep up the good work! - please follow up with your kidney doctor  - Enjoy your trip! [name])

## 2015-10-11 NOTE — Assessment & Plan Note (Signed)
-   No recent gout flares - She is compliant with her allopurinol - Will c/w current dose for now and check uric acid levels - She has been off colchicine and doing well

## 2015-10-12 LAB — BMP8+ANION GAP
Anion Gap: 17 mmol/L (ref 10.0–18.0)
BUN/Creatinine Ratio: 18 (ref 12–28)
BUN: 46 mg/dL — ABNORMAL HIGH (ref 8–27)
CO2: 23 mmol/L (ref 18–29)
Calcium: 8.9 mg/dL (ref 8.7–10.3)
Chloride: 101 mmol/L (ref 96–106)
Creatinine, Ser: 2.55 mg/dL — ABNORMAL HIGH (ref 0.57–1.00)
GFR calc Af Amer: 20 mL/min/{1.73_m2} — ABNORMAL LOW (ref >59)
GFR calc non Af Amer: 17 mL/min/{1.73_m2} — ABNORMAL LOW (ref >59)
Glucose: 89 mg/dL (ref 65–99)
Potassium: 4.8 mmol/L (ref 3.5–5.2)
Sodium: 141 mmol/L (ref 134–144)

## 2015-10-12 LAB — URIC ACID: Uric Acid: 5.5 mg/dL (ref 2.5–7.1)

## 2015-10-13 ENCOUNTER — Telehealth: Payer: Self-pay | Admitting: Internal Medicine

## 2015-10-13 NOTE — Telephone Encounter (Signed)
Called patient to discuss lab results. Creatinine slightly worsened compared to previous. Advised patient to follow up with Dr. Moshe Cipro,. Uric acid wnl. Continue with allopurinol at current dose. Patient expressed understanding and is in agreement with plan

## 2015-10-24 DIAGNOSIS — I129 Hypertensive chronic kidney disease with stage 1 through stage 4 chronic kidney disease, or unspecified chronic kidney disease: Secondary | ICD-10-CM | POA: Diagnosis not present

## 2015-10-24 DIAGNOSIS — D631 Anemia in chronic kidney disease: Secondary | ICD-10-CM | POA: Diagnosis not present

## 2015-10-24 DIAGNOSIS — N184 Chronic kidney disease, stage 4 (severe): Secondary | ICD-10-CM | POA: Diagnosis not present

## 2015-10-24 DIAGNOSIS — M810 Age-related osteoporosis without current pathological fracture: Secondary | ICD-10-CM | POA: Diagnosis not present

## 2015-11-28 ENCOUNTER — Other Ambulatory Visit: Payer: Self-pay | Admitting: Internal Medicine

## 2015-11-28 DIAGNOSIS — Z1231 Encounter for screening mammogram for malignant neoplasm of breast: Secondary | ICD-10-CM

## 2015-12-02 ENCOUNTER — Other Ambulatory Visit: Payer: Self-pay | Admitting: Internal Medicine

## 2015-12-12 ENCOUNTER — Encounter: Payer: Self-pay | Admitting: Podiatry

## 2015-12-12 ENCOUNTER — Ambulatory Visit (INDEPENDENT_AMBULATORY_CARE_PROVIDER_SITE_OTHER): Payer: Medicare Other | Admitting: Podiatry

## 2015-12-12 DIAGNOSIS — B351 Tinea unguium: Secondary | ICD-10-CM | POA: Diagnosis not present

## 2015-12-12 DIAGNOSIS — L603 Nail dystrophy: Secondary | ICD-10-CM

## 2015-12-12 DIAGNOSIS — M79609 Pain in unspecified limb: Secondary | ICD-10-CM

## 2015-12-12 DIAGNOSIS — L608 Other nail disorders: Secondary | ICD-10-CM

## 2015-12-18 NOTE — Progress Notes (Signed)

## 2015-12-19 ENCOUNTER — Ambulatory Visit
Admission: RE | Admit: 2015-12-19 | Discharge: 2015-12-19 | Disposition: A | Discharge status: Home / Self Care | Payer: Medicare Other | Source: Ambulatory Visit | Attending: Internal Medicine | Admitting: Internal Medicine

## 2015-12-19 DIAGNOSIS — Z1231 Encounter for screening mammogram for malignant neoplasm of breast: Secondary | ICD-10-CM | POA: Diagnosis not present

## 2016-01-24 DIAGNOSIS — H905 Unspecified sensorineural hearing loss: Secondary | ICD-10-CM | POA: Diagnosis not present

## 2016-01-24 DIAGNOSIS — H9313 Tinnitus, bilateral: Secondary | ICD-10-CM | POA: Diagnosis not present

## 2016-01-24 DIAGNOSIS — H903 Sensorineural hearing loss, bilateral: Secondary | ICD-10-CM | POA: Diagnosis not present

## 2016-02-07 ENCOUNTER — Other Ambulatory Visit: Payer: Self-pay | Admitting: Internal Medicine

## 2016-02-13 ENCOUNTER — Other Ambulatory Visit: Payer: Self-pay | Admitting: Internal Medicine

## 2016-02-13 IMAGING — MR MR HEAD W/O CM
9 of 10 series · 36 of 48 positions shown · non-contrast
Comparison: 03/12/2011

CLINICAL DATA: Extreme vertigo upon wakening this morning.

EXAM:
MRI HEAD WITHOUT CONTRAST
TECHNIQUE: Multiplanar, multiecho pulse sequences of the brain and surrounding
structures were obtained without intravenous contrast.

[Series 3: T1 · sagittal · 5.0mm · 0.47mm/px · 3 of 23 slices shown]
[im 1/23]
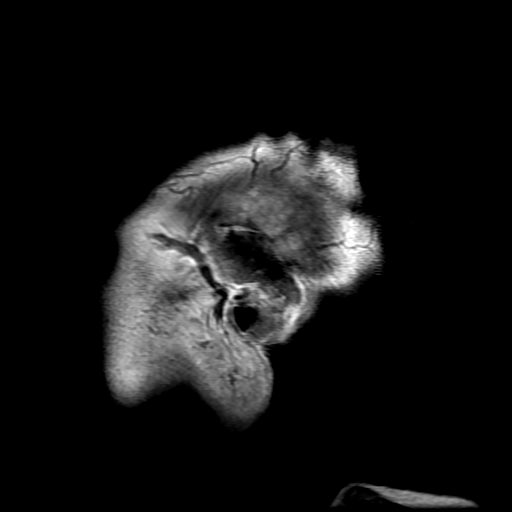
[im 12/23]
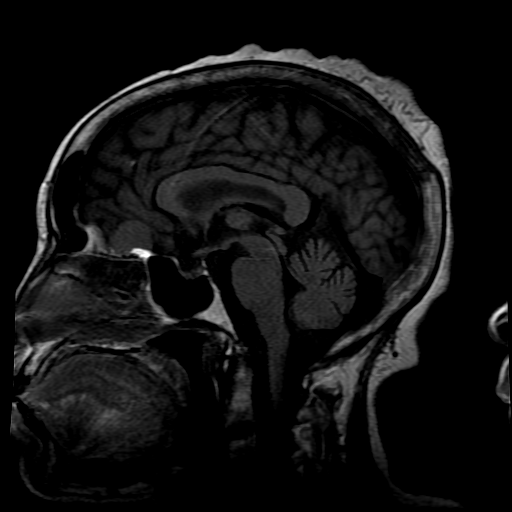
[im 23/23]
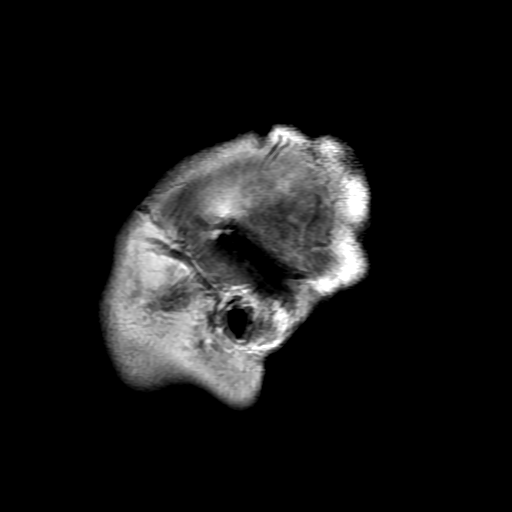

[Series 4: DWI · axial · 3.0mm · 1.09mm/px · z∈[-130,-2]mm · 8 of 88 slices shown (1 of 4)]
[im 1/88]
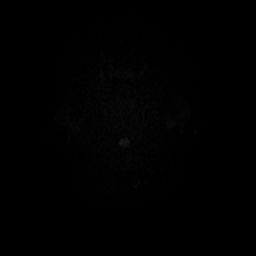
[im 10/88]
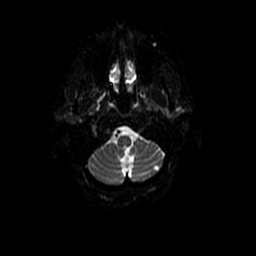
[im 30/88]
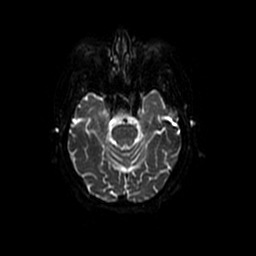
[im 39/88]
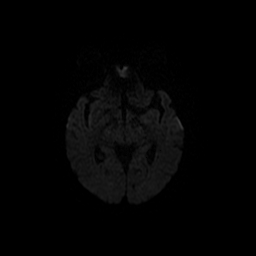
[im 49/88]
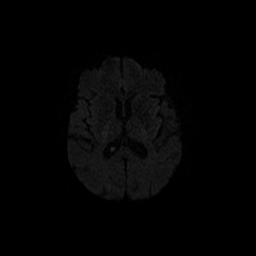
[im 59/88]
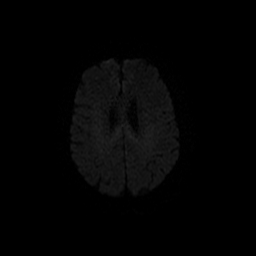
[im 78/88]
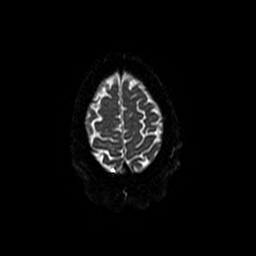
[im 88/88]
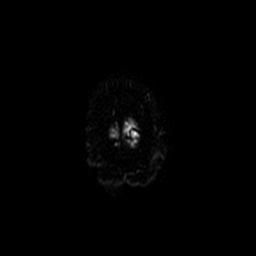

[Series 5: T2 · axial · 5.0mm · 0.43mm/px · z∈[-125,+1]mm · 2 of 22 slices shown (1 of 2)]
[im 1/22]
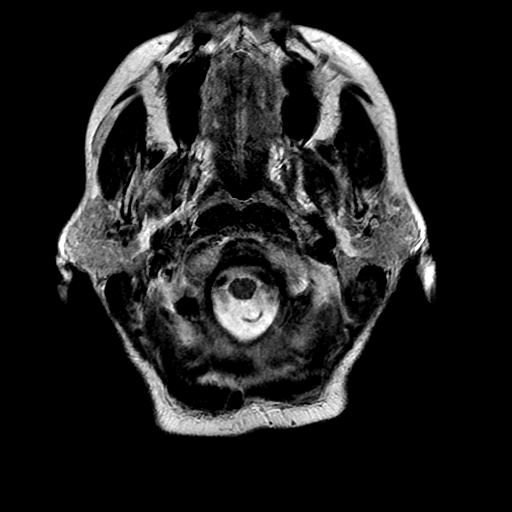
[im 22/22]
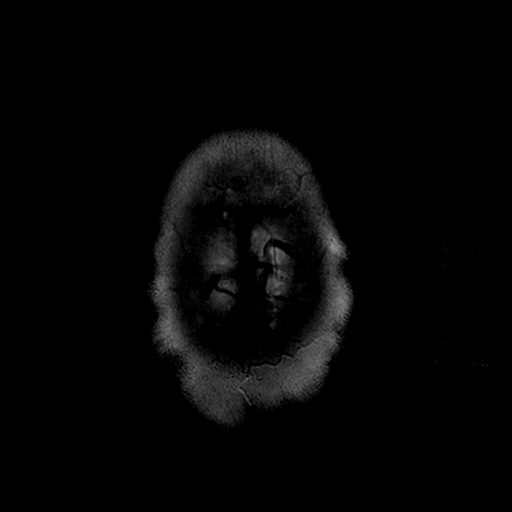

[Series 6: DWI · coronal · 5.0mm · 1.09mm/px · 7 of 66 slices shown (2 of 4)]
[im 1/66]
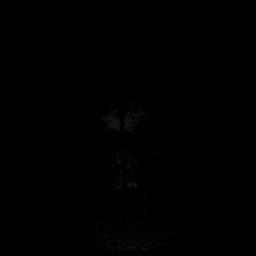
[im 11/66]
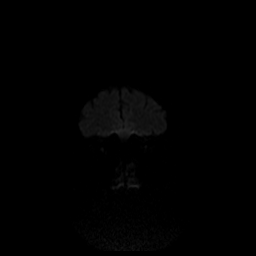
[im 22/66]
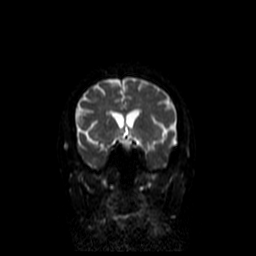
[im 33/66]
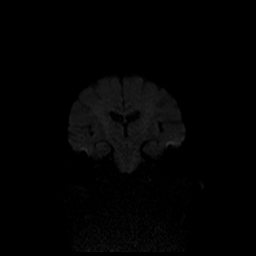
[im 44/66]
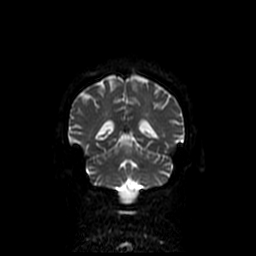
[im 55/66]
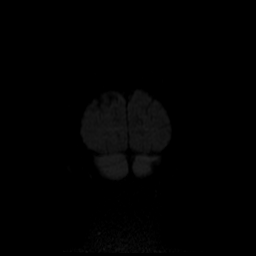
[im 66/66]
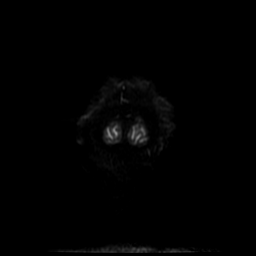

[Series 7: FLAIR · axial · 5.0mm · 0.43mm/px · z∈[-125,+1]mm · 2 of 19 slices shown]
[im 1/19]
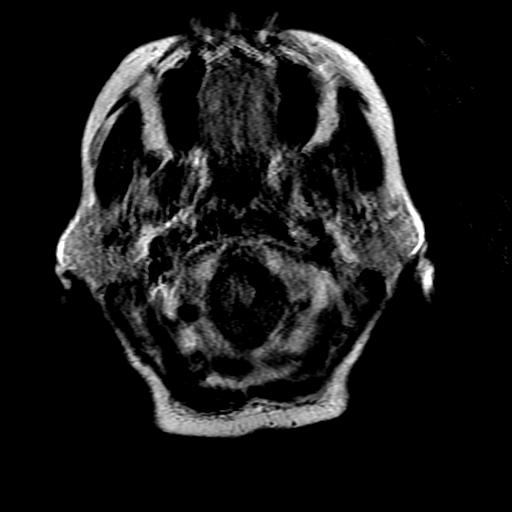
[im 19/19]
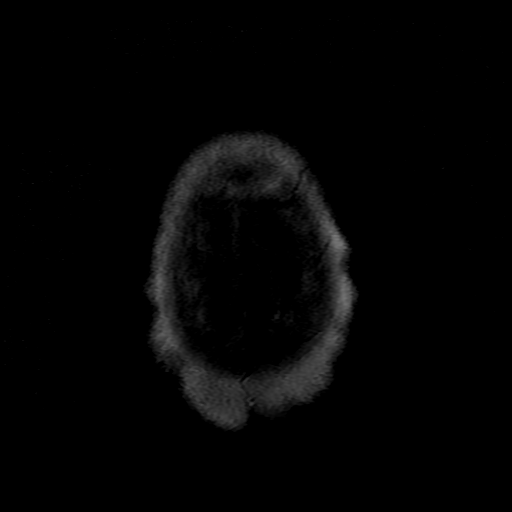

[Series 8: ax mpgr · axial · 5.0mm · 0.43mm/px · z∈[-125,+1]mm · 2 of 19 slices shown]
[im 1/19]
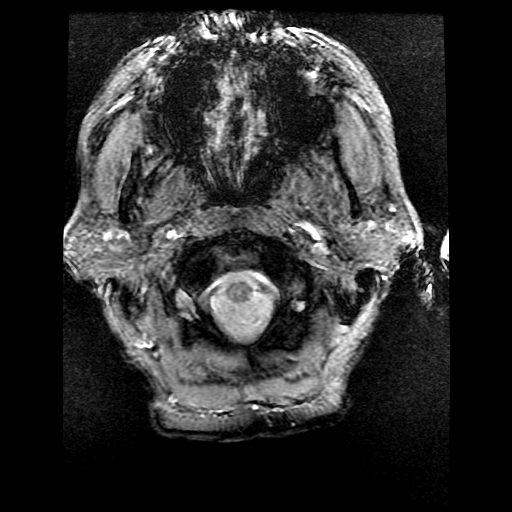
[im 19/19]
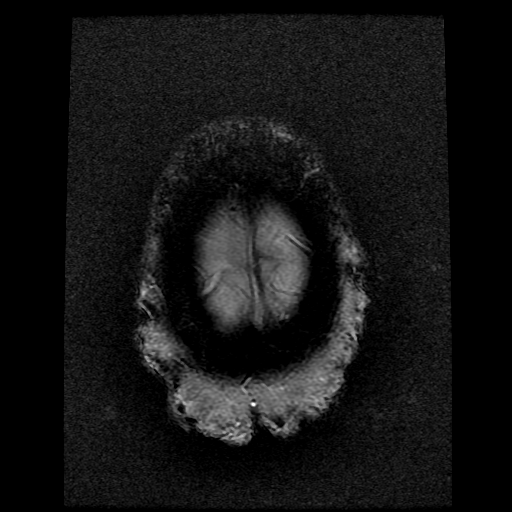

[Series 10: T2 · coronal · 5.0mm · 0.39mm/px · 3 of 25 slices shown (2 of 2)]
[im 1/25]
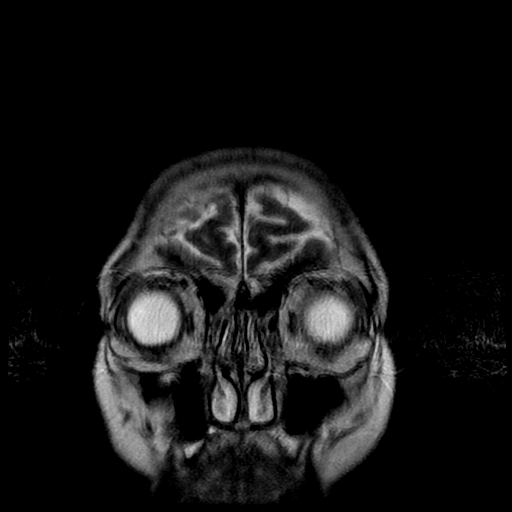
[im 13/25]
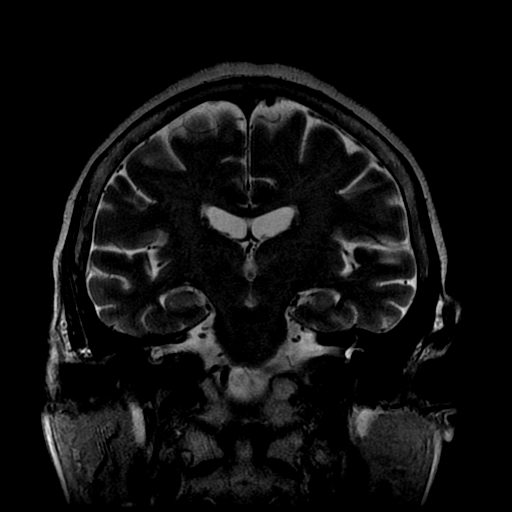
[im 25/25]
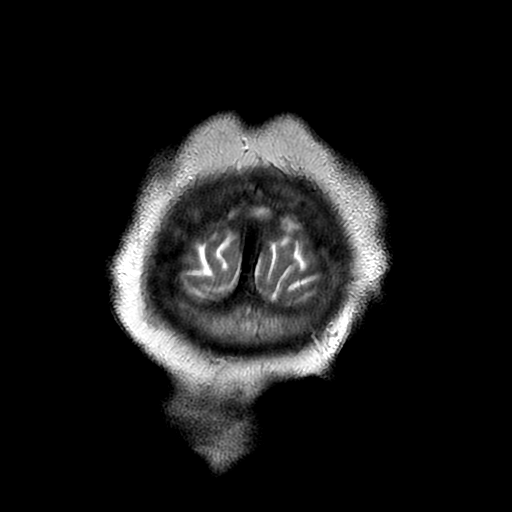

[Series 400: DWI · axial · 3.0mm · 1.09mm/px · z∈[-130,-2]mm · 5 of 44 slices shown (3 of 4)]
[im 1/44]
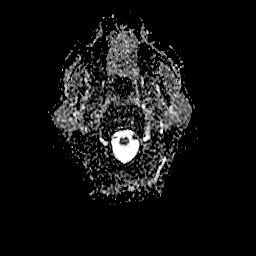
[im 11/44]
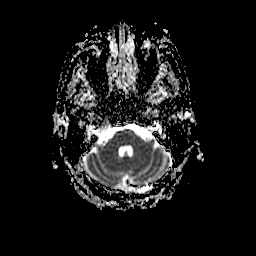
[im 22/44]
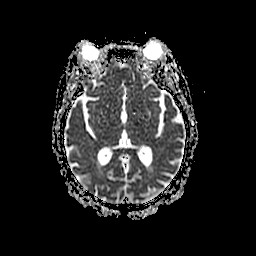
[im 33/44]
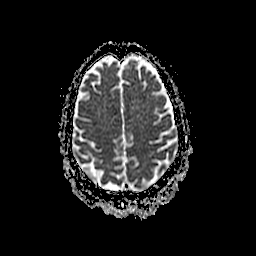
[im 44/44]
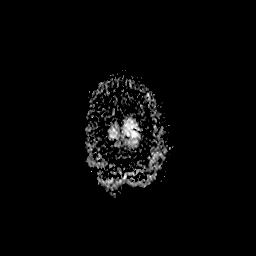

[Series 600: DWI · coronal · 5.0mm · 1.09mm/px · 4 of 33 slices shown (4 of 4)]
[im 1/33]
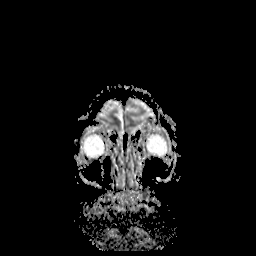
[im 11/33]
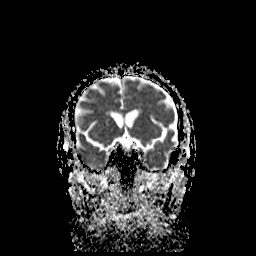
[im 22/33]
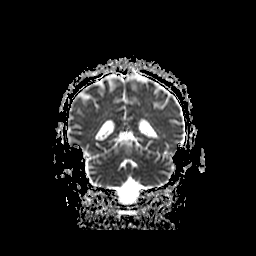
[im 33/33]
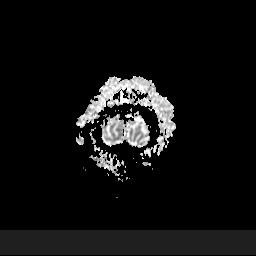

[36 of 48 positions shown; findings below may reference images not displayed]

FINDINGS: Diffusion imaging does not show any acute or subacute infarction.
The brainstem is normal. There are a few old small vessel cerebellar
infarctions. Cerebral hemispheres show age related atrophy with
moderate chronic small-vessel ischemic change of the deep white
matter. No cortical or large vessel territory infarction. No
intra-axial mass lesion, hemorrhage, hydrocephalus or extra-axial
fluid collection. Planum sphenoidale meningioma again demonstrated,
today measuring 21 mm front to back, 19 mm right to left and 15 mm
cephalocaudal. No significant mass effect. When compared to the
previous study, this has enlarged slightly.

No pituitary mass. No inflammatory sinus disease. Major vessels at
the base of the brain show flow.
IMPRESSION: No acute intracranial finding. Moderate chronic small vessel disease
affecting the brain.

Interval enlargement of the planum sphenoidale meningioma, growing
from 14 x 17 x 14 mm to size of 21 x 19 x 15 mm.

## 2016-02-13 MED ORDER — MECLIZINE HCL 12.5 MG PO TABS: 12.5000 mg | ORAL_TABLET | Freq: Every day | ORAL | 0 refills | Status: DC | PRN

## 2016-02-13 NOTE — Telephone Encounter (Signed)
No indication on PL. Will fill only 30 without RF and send to Dr Delane Ginger for review

## 2016-02-13 NOTE — Telephone Encounter (Signed)
Refill Request   meclizine (ANTIVERT) 12.5 MG tablet

## 2016-03-05 ENCOUNTER — Other Ambulatory Visit: Payer: Self-pay | Admitting: Internal Medicine

## 2016-04-03 ENCOUNTER — Other Ambulatory Visit: Payer: Self-pay | Admitting: Internal Medicine

## 2016-04-10 ENCOUNTER — Encounter: Payer: Self-pay | Admitting: Internal Medicine

## 2016-04-10 ENCOUNTER — Ambulatory Visit (INDEPENDENT_AMBULATORY_CARE_PROVIDER_SITE_OTHER): Payer: Medicare Other | Admitting: Internal Medicine

## 2016-04-10 DIAGNOSIS — Z79899 Other long term (current) drug therapy: Secondary | ICD-10-CM | POA: Diagnosis not present

## 2016-04-10 DIAGNOSIS — F32A Depression, unspecified: Secondary | ICD-10-CM

## 2016-04-10 DIAGNOSIS — K219 Gastro-esophageal reflux disease without esophagitis: Secondary | ICD-10-CM | POA: Diagnosis not present

## 2016-04-10 DIAGNOSIS — Z87891 Personal history of nicotine dependence: Secondary | ICD-10-CM | POA: Diagnosis not present

## 2016-04-10 DIAGNOSIS — G4700 Insomnia, unspecified: Secondary | ICD-10-CM | POA: Diagnosis not present

## 2016-04-10 DIAGNOSIS — F329 Major depressive disorder, single episode, unspecified: Secondary | ICD-10-CM | POA: Diagnosis not present

## 2016-04-10 DIAGNOSIS — R42 Dizziness and giddiness: Secondary | ICD-10-CM | POA: Diagnosis not present

## 2016-04-10 DIAGNOSIS — I10 Essential (primary) hypertension: Secondary | ICD-10-CM

## 2016-04-10 DIAGNOSIS — M10022 Idiopathic gout, left elbow: Secondary | ICD-10-CM

## 2016-04-10 DIAGNOSIS — M1A3220 Chronic gout due to renal impairment, left elbow, without tophus (tophi): Secondary | ICD-10-CM

## 2016-04-10 DIAGNOSIS — Z87898 Personal history of other specified conditions: Secondary | ICD-10-CM

## 2016-04-10 NOTE — Assessment & Plan Note (Signed)
-   Symptoms are well controlled with omeprazole 20 mg - Will continue with this for now

## 2016-04-10 NOTE — Assessment & Plan Note (Signed)
-   symptoms are well controlled with bupropion - Will c/w bupropion 150 mg for now - PHQ score 0 today

## 2016-04-10 NOTE — Progress Notes (Signed)
   Subjective:    Patient ID: [name], female    DOB: 03-04-1934, 82 y.o.   MRN: [id number]  HPI I have seen and examined this patient. She presented today for routine evaluation of her HTN and CKD.  Patient states that she feels well and is compliant with her medications. She does note some mild increase in the size of her calves b/l and says that "it seems like they are getting a little fat."   She has no other complaints at this time     Review of Systems  Constitutional: Negative.   HENT: Negative.   Respiratory: Negative.   Cardiovascular: Negative.  Negative for leg swelling.  Gastrointestinal: Negative.   Musculoskeletal: Negative.   Neurological: Negative.   Psychiatric/Behavioral: Negative.        Objective:   Physical Exam  Constitutional: She is oriented to person, place, and time. She appears well-developed and well-nourished.  HENT:  Head: Normocephalic and atraumatic.  Mouth/Throat: No oropharyngeal exudate.  Neck: Neck supple.  Cardiovascular: Normal rate, regular rhythm and normal heart sounds.   Pulmonary/Chest: Effort normal and breath sounds normal. No respiratory distress. She has no wheezes.  Abdominal: Soft. Bowel sounds are normal. She exhibits no distension. There is no tenderness.  Musculoskeletal: Normal range of motion. She exhibits no edema.  Noted to have normal sized calves which are non tender, no erythema, no increased local warmth  Lymphadenopathy:    She has no cervical adenopathy.  Neurological: She is alert and oriented to person, place, and time.  Psychiatric: She has a normal mood and affect. Her behavior is normal.          Assessment & Plan:  Please see problem based charting for assessment and plan:

## 2016-04-10 NOTE — Assessment & Plan Note (Signed)
-   Patient has a history of vertigo for which she has been taking meclizine chronically - Patient has been symptom free for awhile now - Explained to patient that this is not meant to be a chronic medication and there are certain maneuvers we can do in the clinic that are likely to be more beneficial than meclizine if her symptoms recur - Will stop this medication and assess for recurrence of symptoms

## 2016-04-10 NOTE — Patient Instructions (Signed)
-   It was a pleasure seeing you today - Please follow up in 6 months - Please stop taking the meclizine today and watch for recurrence of your symptoms - Please follow up with Dr. Moshe Cipro next month for your kidney disease - Your blood pressure is doing great. Keep up the great work! - We will stop the trazadone. Let us know how you feel. If required we can restart this

## 2016-04-10 NOTE — Assessment & Plan Note (Signed)
BP Readings from Last 3 Encounters:  04/10/16 126/63  10/11/15 126/76  09/27/15 139/74    Lab Results  Component Value Date   NA 141 10/11/2015   K 4.8 10/11/2015   CREATININE 2.55 (H) 10/11/2015    Assessment: Blood pressure control:  well controlled Progress toward BP goal:   at goal Comments: compliant with metoprolol 25 mg bid, amlodipine 10 mg and furosemide 80 mg  Plan: Medications:  continue current medications Educational resources provided: brochure (denies need ) Self management tools provided:   Other plans: Patient to get BMP checked at nephrology office at the end of the month

## 2016-04-10 NOTE — Assessment & Plan Note (Signed)
-   Patient has stopped taking trazadone and instead take tylenol PM at night which works for her - I have taken this off her med list and informed patient that if she requires this again to let us know so we can prescribe it for her

## 2016-04-10 NOTE — Assessment & Plan Note (Signed)
-   Patient with no further gout flares - Her last uric acid level was under 5.5 in Oct 2017 - Will c/w allopurinol 100 mg

## 2016-04-23 ENCOUNTER — Telehealth: Payer: Self-pay | Admitting: *Deleted

## 2016-04-23 ENCOUNTER — Ambulatory Visit (INDEPENDENT_AMBULATORY_CARE_PROVIDER_SITE_OTHER): Payer: Medicare Other | Admitting: Internal Medicine

## 2016-04-23 DIAGNOSIS — M25522 Pain in left elbow: Secondary | ICD-10-CM | POA: Diagnosis not present

## 2016-04-23 DIAGNOSIS — Z9181 History of falling: Secondary | ICD-10-CM | POA: Diagnosis not present

## 2016-04-23 NOTE — Progress Notes (Signed)
Internal Medicine Clinic Attending  Case discussed with Dr. Boswell at the time of the visit.  We reviewed the resident's history and exam and pertinent patient test results.  I agree with the assessment, diagnosis, and plan of care documented in the resident's note.  

## 2016-04-23 NOTE — Progress Notes (Signed)
   CC: Pain in left arm  HPI:  [name] is a 82 y.o. female with a past medical history listed below here today with complaints of pain in her left arm.    For details of today's visit and the status of her chronic medical issues please refer to the assessment and plan.   Past Medical History:  Diagnosis Date  . Anemia due to blood loss, chronic   . Anemia, iron deficiency   . Angiodysplasia of colon 05/09/2005   Single small angiodysplastic lesion in the descending colon found on colonoscopy 05/09/2005 by [name]  . Arthritis    "knots on my thumbs; left elbow" (03/03/2014)  . Cataracts, bilateral   . Chronic ankle pain    bilateral  . Chronic kidney disease, stage IV (severe) ([place])    [name] 02/09/2014  . Chronic knee pain    bilateral  . Chronic thumb pain   . Depression   . Epicondylitis   . Gastritis, Helicobacter pylori    Noted on EGD 01/18/2009 by [name];  biopsy showed chronic gastritis with Helicobacter pylori, treated by [name].  [name] GERD (gastroesophageal reflux disease)   . Gout   . Hemorrhoids, internal 05/09/2005    Found on colonoscopy 05/09/2005 by [name]  . History of blood transfusion    "low blood count"  . Hypertension   . Osteoporosis, unspecified 03/06/2012   A DXA bone density scan done 02/20/2012 showed osteoporosis with a lumbar spine T-score of -4.4 and a right femur T-score of -2.8.  Patient has chronic kidney disease with estimated GFR less than 30, and it is not clear how much of her osteoporosis may be due to renal osteodystrophy.    . Pericardial effusion 11/2004   S/P subxiphoid pericardial window 11/01/2004 by [name].  A question of possible collagen vascular disease had been raised in 2006 due to arthralgias, a history of idiopathic pericarditis, and increased pigmentation of her palms, and she was referred to [name] but he was unable to confirm a rheumatologic diagnosis.  . Pinguecula     . Renal failure    Formerly on hemodialysis; managed by Dr. Corliss Parish  . Stiffness of joint, not elsewhere classified, hand   . Stroke [facility]) 2007   "when my kidneys went out & I was in a coma; weak LUE/LLE since"  . Thigh pain   . Vitreous detachment    "no OR"    Review of Systems:   See HPI  Physical Exam:  Vitals:   04/23/16 1445  BP: 129/72  Pulse: 67  Temp: 98.3 F (36.8 C)  TempSrc: Oral  SpO2: 98%  Weight: 164 lb 14.4 oz (74.8 kg)   Physical Exam  Constitutional: She is well-developed, well-nourished, and in no distress. No distress.  Cardiovascular: Normal rate and regular rhythm.   Pulmonary/Chest: Effort normal and breath sounds normal.  Musculoskeletal:       Left elbow: She exhibits normal range of motion, no swelling, no effusion, no deformity and no laceration. No tenderness found.  Skin: Skin is warm and dry. No rash noted. No erythema.  Old fistula site in left arm with no erythema or warmth. No tenderness to palpation.   Vitals reviewed.    Assessment & Plan:   See Encounters Tab for problem based charting.  Patient discussed with Dr. Lynnae January

## 2016-04-23 NOTE — Telephone Encounter (Signed)
Pt calls and states her old dialysis site is very tender to the touch, she denies swelling, drainage and heat to area. She states she has been off dialysis for 3 to 4 years. States this happen one other time and "they had to go in there with a big needle and do something but it hurt worse than having 8 children" she also wants to know if taking tylenol arthritis will hurt her kidneys. Please advise asap

## 2016-04-23 NOTE — Telephone Encounter (Signed)
Pt coming in for eval of dialysis site per dr Dareen Piano

## 2016-04-23 NOTE — Assessment & Plan Note (Addendum)
[name] called the clinic today with complaints of tenderness at her old dialysis site on her left arm and was scheduled for an appointment to be evaluated.   She tells me that she does not have any pain at her dialysis site but rather in her left elbow. Reports that she was walking this past Friday, 3 days ago, and tripped on the curb and landed on her left side. Has been using ice/heat and tylenol. No other complaints today other than the left elbow pain. She says the pain is constant but much improved with Tylenol and heat. Describes the pain as an achy pain. Worse with repetitive use of her left arm. She is mainly concerned about taking the Tylenol and wants to make sure it will not affect her Kidneys.  She reports being off dialysis for the past 3-4 years. Denies any fevers,chills, nausea, vomiting. No swelling, warmth or redness around the dialysis site.   On exam, she has full range of motion of her elbow with no pain and no tenderness to palpation. Old dialysis site with no erythema or warmth.  Assessment: left elbow pain 2/2 fall  Plan: Continue heat and Tylenol prn - reassured her that it will not affect her Kidneys  No exam findings concerning for fracture

## 2016-04-23 NOTE — Patient Instructions (Signed)
Ms. Shadix,  Your arm looks fine today. You can continue to take the Tylenol as needed for the pain, it will not affect your kidneys at all.  You can take up to 1000 mg every 6 hours as needed of the Tylenol for pain but do not take more than 4000 mg in a day.

## 2016-04-23 NOTE — Telephone Encounter (Signed)
I think she will need to be evaluated in [facility] to see if she has an infection. Tylenol arthritis will not hurt her kidneys. Thank you

## 2016-05-03 DIAGNOSIS — I129 Hypertensive chronic kidney disease with stage 1 through stage 4 chronic kidney disease, or unspecified chronic kidney disease: Secondary | ICD-10-CM | POA: Diagnosis not present

## 2016-05-03 DIAGNOSIS — N184 Chronic kidney disease, stage 4 (severe): Secondary | ICD-10-CM | POA: Diagnosis not present

## 2016-05-03 DIAGNOSIS — M810 Age-related osteoporosis without current pathological fracture: Secondary | ICD-10-CM | POA: Diagnosis not present

## 2016-05-03 DIAGNOSIS — D631 Anemia in chronic kidney disease: Secondary | ICD-10-CM | POA: Diagnosis not present

## 2016-05-03 DIAGNOSIS — M109 Gout, unspecified: Secondary | ICD-10-CM | POA: Diagnosis not present

## 2016-07-03 ENCOUNTER — Other Ambulatory Visit: Payer: Self-pay | Admitting: Internal Medicine

## 2016-07-03 DIAGNOSIS — K219 Gastro-esophageal reflux disease without esophagitis: Secondary | ICD-10-CM

## 2016-07-16 ENCOUNTER — Ambulatory Visit: Payer: Medicare Other

## 2016-07-16 ENCOUNTER — Ambulatory Visit (INDEPENDENT_AMBULATORY_CARE_PROVIDER_SITE_OTHER): Payer: Medicare Other | Admitting: Internal Medicine

## 2016-07-16 ENCOUNTER — Encounter (INDEPENDENT_AMBULATORY_CARE_PROVIDER_SITE_OTHER): Payer: Self-pay

## 2016-07-16 DIAGNOSIS — N898 Other specified noninflammatory disorders of vagina: Secondary | ICD-10-CM

## 2016-07-16 DIAGNOSIS — N899 Noninflammatory disorder of vagina, unspecified: Secondary | ICD-10-CM

## 2016-07-16 NOTE — Progress Notes (Signed)
CC: bulge hanging from vagina   HPI:  [name] is a 82 y.o. with PMH as listed below who presents for acute concern of a mass in her vagina. Please see the assessment and plans for the status of the patient chronic medical problems.    Past Medical History:  Diagnosis Date  . Anemia due to blood loss, chronic   . Anemia, iron deficiency   . Angiodysplasia of colon 05/09/2005   Single small angiodysplastic lesion in the descending colon found on colonoscopy 05/09/2005 by [name]  . Arthritis    "knots on my thumbs; left elbow" (03/03/2014)  . Cataracts, bilateral   . Chronic ankle pain    bilateral  . Chronic kidney disease, stage IV (severe) ([place])    [name] 02/09/2014  . Chronic knee pain    bilateral  . Chronic thumb pain   . Depression   . Epicondylitis   . Gastritis, Helicobacter pylori    Noted on EGD 01/18/2009 by [name];  biopsy showed chronic gastritis with Helicobacter pylori, treated by [name].  [name] GERD (gastroesophageal reflux disease)   . Gout   . Hemorrhoids, internal 05/09/2005    Found on colonoscopy 05/09/2005 by [name]  . History of blood transfusion    "low blood count"  . Hypertension   . Osteoporosis, unspecified 03/06/2012   A DXA bone density scan done 02/20/2012 showed osteoporosis with a lumbar spine T-score of -4.4 and a right femur T-score of -2.8.  Patient has chronic kidney disease with estimated GFR less than 30, and it is not clear how much of her osteoporosis may be due to renal osteodystrophy.    . Pericardial effusion 11/2004   S/P subxiphoid pericardial window 11/01/2004 by [name].  A question of possible collagen vascular disease had been raised in 2006 due to arthralgias, a history of idiopathic pericarditis, and increased pigmentation of her palms, and she was referred to [name] but he was unable to confirm a rheumatologic diagnosis.  . Pinguecula   . Renal failure    Formerly on  hemodialysis; managed by Dr. Corliss Parish  . Stiffness of joint, not elsewhere classified, hand   . Stroke [facility]) 2007   "when my kidneys went out & I was in a coma; weak LUE/LLE since"  . Thigh pain   . Vitreous detachment    "no OR"   Review of Systems:  Refer to history of present illness and assessment and plans for pertinent review of systems, all others reviewed and negative  Physical Exam:  Vitals:   07/16/16 1331  BP: 133/72  Pulse: 81  Weight: 162 lb 11.2 oz (73.8 kg)   Physical Exam  Constitutional: She is oriented to person, place, and time. She appears well-developed and well-nourished. No distress.  HENT:  Head: Normocephalic and atraumatic.  Neck: Normal range of motion.  Genitourinary: Vagina normal.  Genitourinary Comments: External and internal vaginal canal are without masses or lesions. The cervix can be seen and it is without discharge or lesions. No tenderness with speculum exam.  No hemorrhoids or other rectal masses are appreciated  Neurological: She is alert and oriented to person, place, and time.  Skin: Skin is warm and dry. She is not diaphoretic.  Psychiatric: She has a normal mood and affect. Her behavior is normal.   Assessment & Plan:   Vaginal mass  Patient presents after she found something hanging from her vaginal while she was  using the restroom. The mass was flesh colored and rounded and it occurred while she was sitting on the restroom for an extended period of time trying to defecate. She is sure that the mass was not coming from her rectum. She has had no pain, dysuria, urinary frequency or urinary retention, bowel movements have been regular. She is not sexually active. She has had 8 vaginal deliveries and is s/p hysterectomy.  Her history is consistent with uterine or vaginal prolapse, this was not appreciated on vaginal exam today. Discussed with the patient the option for gynecology referral at this time vs monitoring, she would like  to monitor for now and I agree.  - continue to monitor if she develops symptoms of incontinence or urinary frequency we will refer to gynecology   See Encounters Tab for problem based charting.  Patient discussed with Dr. Lynnae January

## 2016-07-16 NOTE — Patient Instructions (Addendum)
[name],   It was a pleasure to meet you today.   Call the clinic if you develop any difficulties with urinating - either going too often or not often enough or if you have burning with urination.   Return to the clinic in two months to see Dr. Dareen Piano  Call the clinic if you have any questions    Pelvic Organ Prolapse Pelvic organ prolapse is the stretching, bulging, or dropping of pelvic organs into an abnormal position. It happens when the muscles and tissues that surround and support pelvic structures are stretched or weak. Pelvic organ prolapse can involve:  Vagina (vaginal prolapse).  Uterus (uterine prolapse).  Bladder (cystocele).  Rectum (rectocele).  Intestines (enterocele).  When organs other than the vagina are involved, they often bulge into the vagina or protrude from the vagina, depending on how severe the prolapse is. What are the causes? Causes of this condition include:  Pregnancy, labor, and childbirth.  Long-lasting (chronic) cough.  Chronic constipation.  Obesity.  Past pelvic surgery.  Aging. During and after menopause, a decreased production of the hormone estrogen can weaken pelvic ligaments and muscles.  Consistently lifting more than 50 lb (23 kg).  Buildup of fluid in the abdomen due to certain diseases and other conditions.  What are the signs or symptoms? Symptoms of this condition include:  Loss of bladder control when you cough, sneeze, strain, and exercise (stress incontinence). This may be worse immediately following childbirth, and it may gradually improve over time.  Feeling pressure in your pelvis or vagina. This pressure may increase when you cough or when you are having a bowel movement.  A bulge that protrudes from the opening of your vagina or against your vaginal wall. If your uterus protrudes through the opening of your vagina and rubs against your clothing, you may also experience soreness, ulcers, infection, pain, and  bleeding.  Increased effort to have a bowel movement or urinate.  Pain in your low back.  Pain, discomfort, or disinterest in sexual intercourse.  Repeated bladder infections (urinary tract infections).  Difficulty inserting or inability to insert a tampon or applicator.  In some people, this condition does not cause any symptoms. How is this diagnosed? Your health care provider may perform an internal and external vaginal and rectal exam. During the exam, you may be asked to cough and strain while you are lying down, sitting, and standing up. Your health care provider will determine if other tests are required, such as bladder function tests. How is this treated? In most cases, this condition needs to be treated only if it produces symptoms. No treatment is guaranteed to correct the prolapse or relieve the symptoms completely. Treatment may include:  Lifestyle changes, such as: ? Avoiding drinking beverages that contain caffeine. ? Increasing your intake of high-fiber foods. This can help to decrease constipation and straining during bowel movements. ? Emptying your bladder at scheduled times (bladder training therapy). This can help to reduce or avoid urinary incontinence. ? Losing weight if you are overweight or obese.  Estrogen. Estrogen may help mild prolapse by increasing the strength and tone of pelvic floor muscles.  Kegel exercises. These may help mild cases of prolapse by strengthening and tightening the muscles of the pelvic floor.  Pessary insertion. A pessary is a soft, flexible device that is placed into your vagina by your health care provider to help support the vaginal walls and keep pelvic organs in place.  Surgery. This is often the only  form of treatment for severe prolapse. Different types of surgeries are available.  Follow these instructions at home:  Wear a sanitary pad or absorbent product if you have urinary incontinence.  Avoid heavy lifting and  straining with exercise and work. Do not hold your breath when you perform mild to moderate lifting and exercise activities. Limit your activities as directed by your health care provider.  Take medicines only as directed by your health care provider.  Perform Kegel exercises as directed by your health care provider.  If you have a pessary, take care of it as directed by your health care provider. Contact a health care provider if:  Your symptoms interfere with your daily activities or sex life.  You need medicine to help with the discomfort.  You notice bleeding from the vagina that is not related to your period.  You have a fever.  You have pain or bleeding when you urinate.  You have bleeding when you have a bowel movement.  You lose urine when you have sex.  You have chronic constipation.  You have a pessary that falls out.  You have vaginal discharge that has a bad smell.  You have low abdominal pain or cramping that is unusual for you. This information is not intended to replace advice given to you by your health care provider. Make sure you discuss any questions you have with your health care provider. Document Released: 07/15/2013 Document Revised: 05/26/2015 Document Reviewed: 03/02/2013 Elsevier Interactive Patient Education  [employer].

## 2016-07-19 DIAGNOSIS — N8110 Cystocele, unspecified: Secondary | ICD-10-CM | POA: Insufficient documentation

## 2016-07-19 DIAGNOSIS — N898 Other specified noninflammatory disorders of vagina: Secondary | ICD-10-CM | POA: Insufficient documentation

## 2016-07-19 NOTE — Assessment & Plan Note (Signed)
Patient presents after she found something hanging from her vaginal while she was using the restroom. The mass was flesh colored and rounded and it occurred while she was sitting on the restroom for an extended period of time trying to defecate. She is sure that the mass was not coming from her rectum. She has had no pain, dysuria, urinary frequency or urinary retention, bowel movements have been regular. She is not sexually active. She has had 8 vaginal deliveries and is s/p hysterectomy.  Her history is consistent with uterine or vaginal prolapse, this was not appreciated on vaginal exam today. Discussed with the patient the option for gynecology referral at this time vs monitoring, she would like to monitor for now and I agree.  - continue to monitor if she develops symptoms of incontinence or urinary frequency we will refer to gynecology

## 2016-07-19 NOTE — Progress Notes (Signed)
Internal Medicine Clinic Attending  Case discussed with Dr. Blum at the time of the visit.  We reviewed the resident's history and exam and pertinent patient test results.  I agree with the assessment, diagnosis, and plan of care documented in the resident's note. 

## 2016-08-13 DIAGNOSIS — Z961 Presence of intraocular lens: Secondary | ICD-10-CM | POA: Diagnosis not present

## 2016-08-21 ENCOUNTER — Ambulatory Visit: Payer: Medicare Other | Admitting: Internal Medicine

## 2016-09-04 ENCOUNTER — Other Ambulatory Visit: Payer: Self-pay | Admitting: Internal Medicine

## 2016-09-11 ENCOUNTER — Encounter: Payer: Self-pay | Admitting: Internal Medicine

## 2016-09-11 ENCOUNTER — Ambulatory Visit (INDEPENDENT_AMBULATORY_CARE_PROVIDER_SITE_OTHER): Payer: Medicare Other | Admitting: Internal Medicine

## 2016-09-11 ENCOUNTER — Ambulatory Visit (HOSPITAL_COMMUNITY)
Admission: RE | Admit: 2016-09-11 | Discharge: 2016-09-11 | Disposition: A | Discharge status: Home / Self Care | Payer: Medicare Other | Source: Ambulatory Visit | Attending: Internal Medicine | Admitting: Internal Medicine

## 2016-09-11 VITALS — BP 141/57 | HR 78 | Temp 98.0°F | Ht 61.0 in | Wt 167.3 lb

## 2016-09-11 DIAGNOSIS — R42 Dizziness and giddiness: Secondary | ICD-10-CM

## 2016-09-11 DIAGNOSIS — Z2821 Immunization not carried out because of patient refusal: Secondary | ICD-10-CM

## 2016-09-11 DIAGNOSIS — Z87898 Personal history of other specified conditions: Secondary | ICD-10-CM

## 2016-09-11 DIAGNOSIS — R140 Abdominal distension (gaseous): Secondary | ICD-10-CM | POA: Insufficient documentation

## 2016-09-11 DIAGNOSIS — I2510 Atherosclerotic heart disease of native coronary artery without angina pectoris: Secondary | ICD-10-CM | POA: Diagnosis not present

## 2016-09-11 DIAGNOSIS — I10 Essential (primary) hypertension: Secondary | ICD-10-CM

## 2016-09-11 DIAGNOSIS — F329 Major depressive disorder, single episode, unspecified: Secondary | ICD-10-CM

## 2016-09-11 DIAGNOSIS — I129 Hypertensive chronic kidney disease with stage 1 through stage 4 chronic kidney disease, or unspecified chronic kidney disease: Secondary | ICD-10-CM | POA: Diagnosis not present

## 2016-09-11 DIAGNOSIS — Z79899 Other long term (current) drug therapy: Secondary | ICD-10-CM

## 2016-09-11 DIAGNOSIS — M4316 Spondylolisthesis, lumbar region: Secondary | ICD-10-CM | POA: Diagnosis not present

## 2016-09-11 DIAGNOSIS — F32A Depression, unspecified: Secondary | ICD-10-CM

## 2016-09-11 DIAGNOSIS — N184 Chronic kidney disease, stage 4 (severe): Secondary | ICD-10-CM | POA: Diagnosis not present

## 2016-09-11 DIAGNOSIS — N898 Other specified noninflammatory disorders of vagina: Secondary | ICD-10-CM

## 2016-09-11 DIAGNOSIS — Z0000 Encounter for general adult medical examination without abnormal findings: Secondary | ICD-10-CM

## 2016-09-11 DIAGNOSIS — K219 Gastro-esophageal reflux disease without esophagitis: Secondary | ICD-10-CM | POA: Diagnosis not present

## 2016-09-11 DIAGNOSIS — N200 Calculus of kidney: Secondary | ICD-10-CM | POA: Diagnosis not present

## 2016-09-11 LAB — BASIC METABOLIC PANEL
Anion gap: 8 (ref 5–15)
BUN: 30 mg/dL — ABNORMAL HIGH (ref 6–20)
CO2: 24 mmol/L (ref 22–32)
Calcium: 8.6 mg/dL — ABNORMAL LOW (ref 8.9–10.3)
Chloride: 108 mmol/L (ref 101–111)
Creatinine, Ser: 1.85 mg/dL — ABNORMAL HIGH (ref 0.44–1.00)
GFR calc Af Amer: 29 mL/min — ABNORMAL LOW (ref >60)
GFR calc non Af Amer: 25 mL/min — ABNORMAL LOW (ref >60)
Glucose, Bld: 79 mg/dL (ref 65–99)
Potassium: 4.4 mmol/L (ref 3.5–5.1)
Sodium: 140 mmol/L (ref 135–145)

## 2016-09-11 MED ORDER — BUPROPION HCL ER (XL) 150 MG PO TB24: 150.0000 mg | ORAL_TABLET | Freq: Every day | ORAL | 1 refills | Status: DC

## 2016-09-11 MED ORDER — OMEPRAZOLE 20 MG PO CPDR: 20.0000 mg | DELAYED_RELEASE_CAPSULE | Freq: Two times a day (BID) | ORAL | 1 refills | Status: DC

## 2016-09-11 MED ORDER — MECLIZINE HCL 12.5 MG PO TABS: 12.5000 mg | ORAL_TABLET | Freq: Two times a day (BID) | ORAL | 0 refills | Status: DC | PRN

## 2016-09-11 MED ORDER — AMLODIPINE BESYLATE 10 MG PO TABS: 10.0000 mg | ORAL_TABLET | Freq: Every day | ORAL | 3 refills | Status: DC

## 2016-09-11 NOTE — Assessment & Plan Note (Signed)
-   This problem is chronic and stable. - Patient states that she takes meclizine daily for this and she doesn't have symptoms of vertigo - I explained to patient that she did not need to take meclizine every day and there are other treatment modalities if she did have vertigo (like Epley manouvre). - She would still like a refill of this medication to use as needed and not daily.  - Will refill for now and see how she does with prn dosing instead of daily dosing

## 2016-09-11 NOTE — Assessment & Plan Note (Signed)
-   This problem is chronic and stable - Patient symptoms are well-controlled on Prilosec 20 mg twice daily - We will refill this medication today

## 2016-09-11 NOTE — Patient Instructions (Signed)
-   It was a pleasure seeing you today - I will order a CT of your abdomen  - I will increase your lasix to 40 mg twice daily for the next 2-3 days for your increased fluid in your legs - I will check your kidney function today - Please come back if your symptoms are worsening

## 2016-09-11 NOTE — Assessment & Plan Note (Signed)
-   This problem is chronic and stable - Patient states that her symptoms are well-controlled with bupropion - We'll refill his medication today

## 2016-09-11 NOTE — Progress Notes (Signed)
   Subjective:    Patient ID: [name], female    DOB: [birth year], 82 y.o.   MRN: [id number]  HPI  I have seen and examined this patient. Patient is here for routine follow-up of her hypertension and CKD.  Patient complains of increased abdominal distention over the last couple days as well as decreased appetite and mild epigastric pain. Patient states that she has had difficulty sleeping over the last couple nights secondary to his abdominal distention. Patient also complains of mildly worsening lower extremity edema over the last week.  Patient is compliant with all her medications and has no other new complaints at this time.   Review of Systems  Constitutional: Positive for appetite change. Negative for activity change, fever and unexpected weight change.  HENT: Negative.   Respiratory: Negative.   Cardiovascular: Negative.   Gastrointestinal: Positive for abdominal distention and abdominal pain. Negative for diarrhea, nausea and vomiting.  Musculoskeletal: Negative.   Skin: Negative.   Neurological: Negative.   Psychiatric/Behavioral: Negative.        Objective:   Physical Exam  Constitutional: She is oriented to person, place, and time. She appears well-developed and well-nourished.  HENT:  Head: Normocephalic and atraumatic.  Mouth/Throat: No oropharyngeal exudate.  Neck: Neck supple.  Cardiovascular: Normal rate, regular rhythm and normal heart sounds.   Pulmonary/Chest: Effort normal and breath sounds normal. No respiratory distress. She has no wheezes.  Abdominal: Soft. Bowel sounds are normal. She exhibits distension. She exhibits no mass. There is no tenderness. There is no rebound.  Patient noted to have mildly distended abdomen which is nontender to palpation with normal bowel sounds  Musculoskeletal: Normal range of motion.  1+ bilateral lower extremity edema  Lymphadenopathy:    She has no cervical adenopathy.  Neurological: She is alert and oriented to  person, place, and time.  Skin: Skin is warm. No erythema.  Psychiatric: She has a normal mood and affect. Her behavior is normal.          Assessment & Plan:  Please see problem based charting for assessment and plan:

## 2016-09-11 NOTE — Assessment & Plan Note (Signed)
-   Patient states she has followed up with gyn for this and that she has vaginal prolapse which is being treated by her gynecologist.

## 2016-09-11 NOTE — Assessment & Plan Note (Signed)
-   patient continues to refuse PNA vaccine - She also refused the flu shot today

## 2016-09-11 NOTE — Assessment & Plan Note (Signed)
-   This problem is chronic and stable - Patient follows-up with nephrology as an outpatient - Repeat BMP done today shows a creatinine of 1.85 which is better than her baseline - Advised her to increase her Lasix to 40 mg twice a day given her mild lower extremity edema for 3 days

## 2016-09-11 NOTE — Assessment & Plan Note (Signed)
BP Readings from Last 3 Encounters:  09/11/16 (!) 141/57  07/16/16 133/72  04/23/16 129/72    Lab Results  Component Value Date   NA 140 09/11/2016   K 4.4 09/11/2016   CREATININE 1.85 (H) 09/11/2016    Assessment: Blood pressure control:  fair Progress toward BP goal:   deteriorated Comments: Patient is compliant with metoprolol 25 mg twice daily and Norvasc 10 mg daily. I suspect that her mildly elevated blood pressure today is secondary to her abdominal discomfort  Plan: Medications:  continue current medications Educational resources provided: brochure (denies need ) Self management tools provided:   Other plans: We'll check a BMP

## 2016-09-11 NOTE — Assessment & Plan Note (Addendum)
-    Patient complains of worsening abdominal distention over the last 3 days as well as decrease appetite and early satiety. -  She denies any nausea or vomiting. She denies any constipation.  - She states the abdominal distension has made it difficult to sleep as well and complains of occasional lightheadedness and fatigue. - She states that she had abdominal distention the past which was secondary to fluid which was drained but has not had that in years - Patient also noted worsening lower extremity edema and states that she has been drinking a lot of water since it's been hot outside - Patient was noted to be mild to moderately distended on exam with normal bowel sounds and nontender. No rebound/guarding - I ordered a BMP to rule out worsening renal failure and a CT abdomen and pelvis to rule out fluid distention/underlying inflammation - Creatinine was actually better than baseline and CT abdomen and pelvis showed some mild inflammatory changes in the transverse and proximal descending colon. - I discussed results with patient who states that her symptoms have improved after eating.  -No further treatment for now

## 2016-09-14 ENCOUNTER — Encounter (HOSPITAL_COMMUNITY): Payer: Self-pay | Admitting: Family Medicine

## 2016-09-14 ENCOUNTER — Ambulatory Visit (HOSPITAL_COMMUNITY)
Admission: EM | Admit: 2016-09-14 | Discharge: 2016-09-14 | Disposition: A | Discharge status: Home / Self Care | Payer: Medicare Other | Attending: Emergency Medicine | Admitting: Emergency Medicine

## 2016-09-14 DIAGNOSIS — K2900 Acute gastritis without bleeding: Secondary | ICD-10-CM | POA: Diagnosis not present

## 2016-09-14 DIAGNOSIS — M940 Chondrocostal junction syndrome [Tietze]: Secondary | ICD-10-CM | POA: Diagnosis not present

## 2016-09-14 DIAGNOSIS — R1013 Epigastric pain: Secondary | ICD-10-CM

## 2016-09-14 DIAGNOSIS — R42 Dizziness and giddiness: Secondary | ICD-10-CM

## 2016-09-14 MED ORDER — SUCRALFATE 1 GM/10ML PO SUSP
1.0000 g | Freq: Four times a day (QID) | ORAL | 0 refills | Status: DC
Start: 2016-09-14 — End: 2017-01-08

## 2016-09-14 NOTE — Discharge Instructions (Signed)
Likely you have inflammation in the lining of the upper stomach called gastritis. Continue taking the omeprazole twice a day and add Zantac 150 mg at nighttime. You are being prescribed a medicine called Carafate. Take one Carafate before each meal and before bedtime. This will help coat the stomach. Do not eat acidic foods or spicy foods or greasy foods. Continue taking the meclizine for dizziness. If you are getting worse over the next couple days, pain in the abdomen increases, vomiting, unable to eat or drink, headache or fever coat to the emergency department. Otherwise, follow-up with your primary care doctor next week

## 2016-09-14 NOTE — ED Triage Notes (Signed)
Pt here for intermittent epigastric pain that is relieved by omeprazole. sts also that she feels full in stomach.  sts that when she belches  and moves around it relieves the pressure. sts also that she has been having intermittent dizziness.

## 2016-09-14 NOTE — ED Provider Notes (Signed)
[place]    CSN: [id number] Arrival date & time: 09/14/16  1721     History   Chief Complaint Chief Complaint  Patient presents with  . Abdominal Pain  . Dizziness    HPI [name] is a 82 y.o. female.   82 year old female presents to the urgent care for epigastric pain for the past 5 days. Is often all that she does not have it every day. Sometimes it is affected by food sometimes not. She often feels bloated. And she has a history of this in the remote past to include helicobacter gastritis. Omeprazole sometimes relieves the pain and discomfort.  She also complains of dizziness is started yesterday. It seems to be a little worse when she turns her head rapidly or moves quickly. No headache or problems with vision, speech. She is hard of hearing. She has a history of vertigo but she states this feels different than vertigo and more like dizziness she takes meclizine which helped some but does not completely abate symptoms.      Past Medical History:  Diagnosis Date  . Anemia due to blood loss, chronic   . Anemia, iron deficiency   . Angiodysplasia of colon 05/09/2005   Single small angiodysplastic lesion in the descending colon found on colonoscopy 05/09/2005 by [name]  . Arthritis    "knots on my thumbs; left elbow" (03/03/2014)  . Cataracts, bilateral   . Chronic ankle pain    bilateral  . Chronic kidney disease, stage IV (severe) ([place])    [name] 02/09/2014  . Chronic knee pain    bilateral  . Chronic thumb pain   . Depression   . Epicondylitis   . Gastritis, Helicobacter pylori    Noted on EGD 01/18/2009 by [name];  biopsy showed chronic gastritis with Helicobacter pylori, treated by [name].  [name] GERD (gastroesophageal reflux disease)   . Gout   . Hemorrhoids, internal 05/09/2005    Found on colonoscopy 05/09/2005 by [name]  . History of blood transfusion    "low blood count"  . Hypertension   . Osteoporosis,  unspecified 03/06/2012   A DXA bone density scan done 02/20/2012 showed osteoporosis with a lumbar spine T-score of -4.4 and a right femur T-score of -2.8.  Patient has chronic kidney disease with estimated GFR less than 30, and it is not clear how much of her osteoporosis may be due to renal osteodystrophy.    . Pericardial effusion 11/2004   S/P subxiphoid pericardial window 11/01/2004 by [name].  A question of possible collagen vascular disease had been raised in 2006 due to arthralgias, a history of idiopathic pericarditis, and increased pigmentation of her palms, and she was referred to [name] but he was unable to confirm a rheumatologic diagnosis.  . Pinguecula   . Renal failure    Formerly on hemodialysis; managed by Dr. Corliss Parish  . Stiffness of joint, not elsewhere classified, hand   . Stroke [facility]) 2007   "when my kidneys went out & I was in a coma; weak LUE/LLE since"  . Thigh pain   . Vitreous detachment    "no OR"    Patient Active Problem List   Diagnosis Date Noted  . Abdominal distension 09/11/2016  . Preventative health care 09/11/2016  . Vaginal mass 07/19/2016  . History of vertigo 04/10/2016  . Insomnia 05/03/2015  . GERD (gastroesophageal reflux disease) 05/03/2015  . Fatigue 04/20/2015  . Meningioma ([place])  03/01/2015  . History of CVA (cerebrovascular accident) 01/24/2015  . Gout of left elbow 03/03/2014  . Hearing loss 11/03/2013  . Osteoporosis 03/06/2012  . Depression 08/30/2009  . Iron deficiency anemia 06/01/2009  . History of Pericardial Effusion 06/01/2009  . CKD (chronic kidney disease) stage 4, GFR 15-29 ml/min (HCC) 06/01/2009  . CATARACTS 10/24/2005  . Essential hypertension 10/24/2005  . ANGIODYSPLASIA, COLON 05/09/2005    Past Surgical History:  Procedure Laterality Date  . ABDOMINAL HYSTERECTOMY  1960's  . ARTERIOVENOUS GRAFT PLACEMENT Left 11/28/2005   forearm arteriovenous  . CARDIAC CATHETERIZATION  01/2005    [name] 05/15/2010  . HALLUX VALGUS CORRECTION Left 01/2012   foot  . SUBXYPHOID PERICARDIAL WINDOW  11/01/2004  . THROMBECTOMY Left  03/06/2006   Simple thrombectomy arteriovenous Gore-Tex graft, left arm, with exploration of venous end and intraoperative shuntogram.         . THROMBECTOMY / ARTERIOVENOUS GRAFT REVISION Left 01/21/2006   Thrombectomy and revision of left forearm loop AV graft.  . TUBAL LIGATION  1960's   "before hysterectomy"    OB History    No data available       Home Medications    Prior to Admission medications   Medication Sig Start Date End Date Taking? Authorizing Provider  allopurinol (ZYLOPRIM) 100 MG tablet TAKE 1 & 1/2 (ONE & ONE-HALF) TABLETS BY MOUTH ONCE DAILY 09/06/16   [name], MD  amLODipine (NORVASC) 10 MG tablet Take 1 tablet (10 mg total) by mouth at bedtime. 09/11/16   [name], MD  atorvastatin (LIPITOR) 40 MG tablet Take 1 tablet (40 mg total) by mouth daily. 01/24/15   Iline Oven, MD  buPROPion (WELLBUTRIN XL) 150 MG 24 hr tablet Take 1 tablet (150 mg total) by mouth daily. 09/11/16   [name], MD  furosemide (LASIX) 80 MG tablet TAKE ONE-HALF TABLET BY MOUTH ONCE DAILY 08/19/15   [name], MD  meclizine (ANTIVERT) 12.5 MG tablet Take 1 tablet (12.5 mg total) by mouth 2 (two) times daily as needed for dizziness. 09/11/16   [name], MD  metoprolol tartrate (LOPRESSOR) 25 MG tablet Take 1 tablet (25 mg total) by mouth 2 (two) times daily. 08/04/15 08/03/16  Oval Linsey, MD  Multiple Vitamins-Minerals (CENTRUM SILVER ADULT 50+ PO) Take 1 tablet by mouth daily.    [provider]  omeprazole (PRILOSEC) 20 MG capsule Take 1 capsule (20 mg total) by mouth 2 (two) times daily. 09/11/16   [name], MD  SENSIPAR 30 MG tablet TAKE ONE TABLET BY MOUTH ONCE DAILY 04/03/16   [name], MD  sucralfate (CARAFATE) 1 GM/10ML suspension Take 10 mLs (1 g total) by mouth 4 (four) times daily.  10 mL before meals and before bedtime 09/14/16   Janne Napoleon, NP    Family History Family History  Problem Relation Age of Onset  . Hypertension Brother   . Heart disease Father   . Diabetes Brother   . Breast cancer Neg Hx   . Colon cancer Neg Hx   . Lung cancer Neg Hx   . Cervical cancer Neg Hx   . Sickle cell anemia Neg Hx     Social History Social History  Substance Use Topics  . Smoking status: Former Smoker    Packs/day: 0.10    Years: 1.00    Types: Cigarettes    Quit date: 01/02/1960  . Smokeless tobacco: Never Used  . Alcohol use No     Allergies  Ibuprofen   Review of Systems Review of Systems  Constitutional: Negative.  Negative for fever.  HENT: Positive for hearing loss. Negative for congestion, ear discharge, rhinorrhea, sinus pain, tinnitus and trouble swallowing.   Eyes: Negative.   Respiratory: Negative.   Cardiovascular: Negative.  Negative for chest pain.  Gastrointestinal: Positive for abdominal pain. Negative for constipation, diarrhea, nausea and vomiting.       As per history of present illness  Genitourinary: Negative.   Musculoskeletal: Negative.   Skin: Negative.   Neurological: Positive for dizziness. Negative for tremors, syncope, speech difficulty, numbness and headaches.  All other systems reviewed and are negative.    Physical Exam Triage Vital Signs ED Triage Vitals [09/14/16 1756]  Enc Vitals Group     BP (!) 123/59     Pulse Rate 69     Resp 18     Temp 98 F (36.7 C)     Temp src      SpO2 99 %     Weight      Height      Head Circumference      Peak Flow      Pain Score      Pain Loc      Pain Edu?      Excl. in [place]?    No data found.   Updated Vital Signs BP (!) 123/59   Pulse 69   Temp 98 F (36.7 C)   Resp 18   SpO2 99%   Visual Acuity Right Eye Distance:   Left Eye Distance:   Bilateral Distance:    Right Eye Near:   Left Eye Near:    Bilateral Near:     Physical Exam  Constitutional: She  is oriented to person, place, and time. She appears well-developed and well-nourished. No distress.  HENT:  Mouth/Throat: Oropharynx is clear and moist. No oropharyngeal exudate.  Supple rises symmetrically, uvula midline. Tongue midline. Speech clear.  Eyes: Pupils are equal, round, and reactive to light. EOM are normal.  Neck: Normal range of motion. Neck supple.  Cardiovascular: Normal rate, regular rhythm, normal heart sounds and intact distal pulses.   Pulmonary/Chest: Effort normal and breath sounds normal. No respiratory distress.  Abdominal: Soft. Bowel sounds are normal. There is no rebound and no guarding.  There is tenderness to the epigastrium as well as along the xiphoid and medial collateral costal margins. No tenderness bilaterally or in the lower abdomen.  Musculoskeletal: Normal range of motion. She exhibits no edema or deformity.  Lymphadenopathy:    She has no cervical adenopathy.  Neurological: She is alert and oriented to person, place, and time. No cranial nerve deficit. She exhibits normal muscle tone.  Having the patient turn her head left to right right to left and then flexion reproduces her dizziness temporarily. No nystagmus appreciated before or after the head movements.  Skin: Skin is warm and dry.  Nursing note and vitals reviewed.    UC Treatments / Results  Labs (all labs ordered are listed, but only abnormal results are displayed) Labs Reviewed - No data to display  EKG  EKG Interpretation None       Radiology No results found.  Procedures Procedures (including critical care time)  Medications Ordered in UC Medications - No data to display   Initial Impression / Assessment and Plan / UC Course  I have reviewed the triage vital signs and the nursing notes.  Pertinent labs & imaging results that were available during  my care of the patient were reviewed by me and considered in my medical decision making (see chart for details).      Likely you have inflammation in the lining of the upper stomach called gastritis. Continue taking the omeprazole twice a day and add Zantac 150 mg at nighttime. You are being prescribed a medicine called Carafate. Take one Carafate before each meal and before bedtime. This will help coat the stomach. Do not eat acidic foods or spicy foods or greasy foods. Continue taking the meclizine for dizziness. If you are getting worse over the next couple days, pain in the abdomen increases, vomiting, unable to eat or drink, headache or fever coat to the emergency department. Otherwise, follow-up with your primary care doctor next week Ice packs to lower mid chest  Final Clinical Impressions(s) / UC Diagnoses   Final diagnoses:  Acute gastritis without hemorrhage, unspecified gastritis type  Abdominal pain, epigastric  Costochondritis  Dizziness    New Prescriptions New Prescriptions   SUCRALFATE (CARAFATE) 1 GM/10ML SUSPENSION    Take 10 mLs (1 g total) by mouth 4 (four) times daily. 10 mL before meals and before bedtime     Controlled Substance Prescriptions [place] Controlled Substance Registry consulted? Not Applicable   Janne Napoleon, NP 09/14/16 [phone]

## 2016-09-17 ENCOUNTER — Ambulatory Visit (HOSPITAL_COMMUNITY): Payer: Medicare Other

## 2016-09-21 ENCOUNTER — Encounter: Payer: Self-pay | Admitting: Internal Medicine

## 2016-09-21 ENCOUNTER — Ambulatory Visit (INDEPENDENT_AMBULATORY_CARE_PROVIDER_SITE_OTHER): Payer: Medicare Other | Admitting: Internal Medicine

## 2016-09-21 VITALS — BP 123/63 | HR 76 | Temp 97.8°F | Ht 61.0 in | Wt 163.4 lb

## 2016-09-21 DIAGNOSIS — H518 Other specified disorders of binocular movement: Secondary | ICD-10-CM | POA: Diagnosis not present

## 2016-09-21 DIAGNOSIS — D329 Benign neoplasm of meninges, unspecified: Secondary | ICD-10-CM

## 2016-09-21 DIAGNOSIS — H9190 Unspecified hearing loss, unspecified ear: Secondary | ICD-10-CM | POA: Diagnosis not present

## 2016-09-21 DIAGNOSIS — R110 Nausea: Secondary | ICD-10-CM | POA: Diagnosis not present

## 2016-09-21 DIAGNOSIS — H5500 Unspecified nystagmus: Secondary | ICD-10-CM

## 2016-09-21 DIAGNOSIS — I69354 Hemiplegia and hemiparesis following cerebral infarction affecting left non-dominant side: Secondary | ICD-10-CM

## 2016-09-21 DIAGNOSIS — H538 Other visual disturbances: Secondary | ICD-10-CM | POA: Diagnosis not present

## 2016-09-21 DIAGNOSIS — Z87898 Personal history of other specified conditions: Secondary | ICD-10-CM

## 2016-09-21 DIAGNOSIS — Z87891 Personal history of nicotine dependence: Secondary | ICD-10-CM | POA: Diagnosis not present

## 2016-09-21 DIAGNOSIS — R42 Dizziness and giddiness: Secondary | ICD-10-CM

## 2016-09-21 DIAGNOSIS — H8110 Benign paroxysmal vertigo, unspecified ear: Secondary | ICD-10-CM | POA: Insufficient documentation

## 2016-09-21 DIAGNOSIS — R51 Headache: Secondary | ICD-10-CM | POA: Diagnosis not present

## 2016-09-21 MED ORDER — MECLIZINE HCL 12.5 MG PO TABS: 12.5000 mg | ORAL_TABLET | Freq: Two times a day (BID) | ORAL | 0 refills | Status: DC | PRN

## 2016-09-21 NOTE — Progress Notes (Signed)
CC: dizziness  HPI:  [name] is a 82 y.o. female with past medical history as documented below presenting with a chief complaint of dizziness. She was seen by Dr. Dareen Piano on 09/11/2016 and was having symptoms at that time. Since then she states her symptoms have worsened. She describes her episodes of dizziness as swaying and feeling off balance. She denies the sensation of the room spinning. She has 4-5 episodes daily. These episodes last for several seconds, but no longer than one minute. She feels in her normal state of health after the episodes occur. She does not know if her dizziness occurs in relation to certain head positions. She has associated nausea but no vomiting or falls. She denies focal weakness, numbness, or tingling. She experiences a headache for several minutes after having an episode, but it typically resolves on its own or with tylenol. She states she has a lot of anxiety when the episodes occur. She has been taking Meclizine 12.5 mg TID, which alleviates her symptoms temporarily. The patient expresses concern that this may be due to her meningioma growing or a new stroke.   Past Medical History:  Diagnosis Date  . Anemia due to blood loss, chronic   . Anemia, iron deficiency   . Angiodysplasia of colon 05/09/2005   Single small angiodysplastic lesion in the descending colon found on colonoscopy 05/09/2005 by [name]  . Arthritis    "knots on my thumbs; left elbow" (03/03/2014)  . Cataracts, bilateral   . Chronic ankle pain    bilateral  . Chronic kidney disease, stage IV (severe) ([place])    [name] 02/09/2014  . Chronic knee pain    bilateral  . Chronic thumb pain   . Depression   . Epicondylitis   . Gastritis, Helicobacter pylori    Noted on EGD 01/18/2009 by [name];  biopsy showed chronic gastritis with Helicobacter pylori, treated by [name].  [name] GERD (gastroesophageal reflux disease)   . Gout   . Hemorrhoids, internal 05/09/2005   Found on colonoscopy 05/09/2005 by [name]  . History of blood transfusion    "low blood count"  . Hypertension   . Osteoporosis, unspecified 03/06/2012   A DXA bone density scan done 02/20/2012 showed osteoporosis with a lumbar spine T-score of -4.4 and a right femur T-score of -2.8.  Patient has chronic kidney disease with estimated GFR less than 30, and it is not clear how much of her osteoporosis may be due to renal osteodystrophy.    . Pericardial effusion 11/2004   S/P subxiphoid pericardial window 11/01/2004 by [name].  A question of possible collagen vascular disease had been raised in 2006 due to arthralgias, a history of idiopathic pericarditis, and increased pigmentation of her palms, and she was referred to [name] but he was unable to confirm a rheumatologic diagnosis.  . Pinguecula   . Renal failure    Formerly on hemodialysis; managed by Dr. Corliss Parish  . Stiffness of joint, not elsewhere classified, hand   . Stroke [facility] 2007   "when my kidneys went out & I was in a coma; weak LUE/LLE since"  . Thigh pain   . Vitreous detachment    "no OR"   Review of Systems:   Review of Systems  Constitutional: Positive for malaise/fatigue. Negative for chills, fever and weight loss.  HENT: Positive for hearing loss. Negative for congestion, ear discharge, ear pain, sinus pain and tinnitus.   Eyes: Positive  for blurred vision.  Respiratory: Negative for shortness of breath.   Cardiovascular: Negative for chest pain.  Gastrointestinal: Positive for nausea. Negative for abdominal pain, constipation, diarrhea and vomiting.  Musculoskeletal: Negative for falls.  Skin: Negative.   Neurological: Positive for dizziness and headaches. Negative for tingling, sensory change, speech change and focal weakness.  Psychiatric/Behavioral: Negative for depression. The patient is nervous/anxious.     Physical Exam:  Vitals:   09/21/16 0947  BP: 123/63  Pulse:  76  Temp: 97.8 F (36.6 C)  TempSrc: Oral  SpO2: 100%  Weight: 163 lb 6.4 oz (74.1 kg)  Height: 5\' 1"  (1.549 m)   Physical Exam  Constitutional: She is oriented to person, place, and time. She appears well-developed and well-nourished. No distress.  HENT:  Head: Normocephalic and atraumatic.  Eyes: Pupils are equal, round, and reactive to light. Conjunctivae are normal. Right eye exhibits nystagmus (Appreciated with right lateral gaze ). Left eye exhibits nystagmus.  Neck: Normal range of motion. Neck supple.  Cardiovascular: Normal rate, regular rhythm and normal heart sounds.   Pulmonary/Chest: Effort normal and breath sounds normal.  Abdominal: Soft. Bowel sounds are normal. There is no tenderness.  Neurological: She is alert and oriented to person, place, and time. She has normal reflexes. No cranial nerve deficit or sensory deficit.  No focal neurological deficits appreciated. Mild left residual weakness consistent with previous stroke. Strength:  Right: 5/5 upper and lower extremities Left: 4/5 left upper and lower extremities Dix hallpike negative.     Assessment & Plan:   See Encounters Tab for problem based charting.  Patient seen with Dr. Evette Doffing

## 2016-09-21 NOTE — Patient Instructions (Signed)
Ms. Parcher,   Your dizziness is due to something called Benign Positional Paroxysmal Vertigo. It is caused by collections of calcium (small stones) in the inner ear. These collections are called canaliths. Moving the canaliths (called canalith repositioning) is a common treatment for BPPV. The maneuvers tried in the office today did not seem to help your symptoms.   I have referred you to vestibular rehabilitation. Physical therapists will work with you to help move the stones out of the inner ear.   The medication Meclizine is treating the symptoms of vertigo, not the cause. Take it as needed if it helps your symptoms.

## 2016-09-21 NOTE — Assessment & Plan Note (Signed)
The patients symptoms and physical exam findings are consistent with BPPV. Dix Hallpike maneuver was performed and the patient did not complain of dizziness upon left and right head turns, but did experience dizziness upon sitting upright and looking upward. Epley maneuver was performed,as well as deep head maneuver. She did not have immediate relief of symptoms, but several minutes after the maneuvers she felt improved.   Plan:  -Ambulatory referral to vestibular rehabilitation if the patient's symptoms do not improve  -Meclizine 12.5 mg PRN for symptom management. Explained to the patient that the Meclizine is only treating the symptoms and not the cause of her dizziness and to only take if needed. I discussed with her that if symptoms return she should attend vestibular rehab.  -Reassured the patient that her symptoms are not from her meningioma or from a stroke. If so those symptoms would be constant and they would not come and go.

## 2016-09-24 NOTE — Progress Notes (Signed)
Internal Medicine Clinic Attending  I saw and evaluated the patient.  I personally confirmed the key portions of the history and exam documented by Dr. LaCroce and I reviewed pertinent patient test results.  The assessment, diagnosis, and plan were formulated together and I agree with the documentation in the resident's note.  

## 2016-10-01 ENCOUNTER — Other Ambulatory Visit: Payer: Self-pay | Admitting: Internal Medicine

## 2016-10-16 ENCOUNTER — Other Ambulatory Visit: Payer: Self-pay | Admitting: Internal Medicine

## 2016-10-16 DIAGNOSIS — Z87898 Personal history of other specified conditions: Secondary | ICD-10-CM

## 2016-10-16 NOTE — Telephone Encounter (Signed)
She got 30 pills 9/22. Is she really out? Vest rehab appt has been sch

## 2016-10-16 NOTE — Telephone Encounter (Signed)
Called pt she states she takes daily now, she states if she doesn't take it she has "those dizzy spells", she confirmed her vest rehab appt and stated she is planning on keeping that appt

## 2016-10-22 ENCOUNTER — Ambulatory Visit: Payer: Medicare Other | Admitting: Rehabilitative and Restorative Service Providers"

## 2016-10-22 ENCOUNTER — Other Ambulatory Visit: Payer: Self-pay | Admitting: Internal Medicine

## 2016-10-22 DIAGNOSIS — I10 Essential (primary) hypertension: Secondary | ICD-10-CM

## 2016-10-22 DIAGNOSIS — Z87898 Personal history of other specified conditions: Secondary | ICD-10-CM

## 2016-10-24 ENCOUNTER — Ambulatory Visit
Payer: Medicare Other | Attending: Student in an Organized Health Care Education/Training Program | Admitting: Rehabilitative and Restorative Service Providers"

## 2016-10-24 DIAGNOSIS — R42 Dizziness and giddiness: Secondary | ICD-10-CM

## 2016-10-24 NOTE — Therapy (Signed)
[facility] [address] [place], [place], [address] Phone: [phone]   Fax:  [phone]  Physical Therapy--Encounter note/ no eval indicated at this time  Patient Details  Name: [name] MRN: [id number] Date of Birth: 11/13/1934 No Data Recorded  Encounter Date: 10/24/2016    Past Medical History:  Diagnosis Date  . Anemia due to blood loss, chronic   . Anemia, iron deficiency   . Angiodysplasia of colon 05/09/2005   Single small angiodysplastic lesion in the descending colon found on colonoscopy 05/09/2005 by [name]  . Arthritis    "knots on my thumbs; left elbow" (03/03/2014)  . Cataracts, bilateral   . Chronic ankle pain    bilateral  . Chronic kidney disease, stage IV (severe) ([place])    [name] 02/09/2014  . Chronic knee pain    bilateral  . Chronic thumb pain   . Depression   . Epicondylitis   . Gastritis, Helicobacter pylori    Noted on EGD 01/18/2009 by [name];  biopsy showed chronic gastritis with Helicobacter pylori, treated by [name].  [name] GERD (gastroesophageal reflux disease)   . Gout   . Hemorrhoids, internal 05/09/2005    Found on colonoscopy 05/09/2005 by [name]  . History of blood transfusion    "low blood count"  . Hypertension   . Osteoporosis, unspecified 03/06/2012   A DXA bone density scan done 02/20/2012 showed osteoporosis with a lumbar spine T-score of -4.4 and a right femur T-score of -2.8.  Patient has chronic kidney disease with estimated GFR less than 30, and it is not clear how much of her osteoporosis may be due to renal osteodystrophy.    . Pericardial effusion 11/2004   S/P subxiphoid pericardial window 11/01/2004 by [name].  A question of possible collagen vascular disease had been raised in 2006 due to arthralgias, a history of idiopathic pericarditis, and increased pigmentation of her palms, and she was referred to [name]  but he was unable to confirm a rheumatologic diagnosis.  . Pinguecula   . Renal failure    Formerly on hemodialysis; managed by Dr. Corliss Parish  . Stiffness of joint, not elsewhere classified, hand   . Stroke [facility]) 2007   "when my kidneys went out & I was in a coma; weak LUE/LLE since"  . Thigh pain   . Vitreous detachment    "no OR"    Past Surgical History:  Procedure Laterality Date  . ABDOMINAL HYSTERECTOMY  1960's  . ARTERIOVENOUS GRAFT PLACEMENT Left 11/28/2005   forearm arteriovenous  . CARDIAC CATHETERIZATION  01/2005   [name] 05/15/2010  . HALLUX VALGUS CORRECTION Left 01/2012   foot  . SUBXYPHOID PERICARDIAL WINDOW  11/01/2004  . THROMBECTOMY Left  03/06/2006   Simple thrombectomy arteriovenous Gore-Tex graft, left arm, with exploration of venous end and intraoperative shuntogram.         . THROMBECTOMY / ARTERIOVENOUS GRAFT REVISION Left 01/21/2006   Thrombectomy and revision of left forearm loop AV graft.  . TUBAL LIGATION  1960's   "before hysterectomy"    There were no vitals filed for this visit.      Subjective Assessment - 10/24/16 0849    Subjective The patient presents reporting all symptoms have cleared and she is not limited.  PT did not continue with evaluation.        Patient will benefit from skilled therapeutic intervention in order to improve the following deficits and  impairments:     Visit Diagnosis: Dizziness and giddiness    Merrick Maggio, PT 10/24/2016, 8:53 AM  [facility] [address] [place], [place], [address] Phone: [phone]   Fax:  [phone]  Name: [name] MRN: [id number] Date of Birth: 07-Mar-1934

## 2016-11-14 ENCOUNTER — Other Ambulatory Visit: Payer: Self-pay | Admitting: Internal Medicine

## 2016-11-14 DIAGNOSIS — K219 Gastro-esophageal reflux disease without esophagitis: Secondary | ICD-10-CM

## 2016-12-12 ENCOUNTER — Telehealth: Payer: Self-pay | Admitting: Internal Medicine

## 2016-12-12 NOTE — Telephone Encounter (Signed)
Please call pt back about dizzy spells.

## 2016-12-13 NOTE — Telephone Encounter (Signed)
Lm for rtc 

## 2016-12-20 DIAGNOSIS — N184 Chronic kidney disease, stage 4 (severe): Secondary | ICD-10-CM | POA: Diagnosis not present

## 2016-12-20 DIAGNOSIS — D631 Anemia in chronic kidney disease: Secondary | ICD-10-CM | POA: Diagnosis not present

## 2016-12-20 DIAGNOSIS — I129 Hypertensive chronic kidney disease with stage 1 through stage 4 chronic kidney disease, or unspecified chronic kidney disease: Secondary | ICD-10-CM | POA: Diagnosis not present

## 2016-12-20 DIAGNOSIS — M109 Gout, unspecified: Secondary | ICD-10-CM | POA: Diagnosis not present

## 2016-12-20 DIAGNOSIS — M810 Age-related osteoporosis without current pathological fracture: Secondary | ICD-10-CM | POA: Diagnosis not present

## 2017-01-08 ENCOUNTER — Encounter: Payer: Self-pay | Admitting: Internal Medicine

## 2017-01-08 ENCOUNTER — Other Ambulatory Visit: Payer: Self-pay

## 2017-01-08 ENCOUNTER — Ambulatory Visit (INDEPENDENT_AMBULATORY_CARE_PROVIDER_SITE_OTHER): Payer: Medicare Other | Admitting: Internal Medicine

## 2017-01-08 VITALS — BP 134/66 | HR 70 | Temp 97.8°F | Ht 61.0 in | Wt 162.5 lb

## 2017-01-08 DIAGNOSIS — F329 Major depressive disorder, single episode, unspecified: Secondary | ICD-10-CM

## 2017-01-08 DIAGNOSIS — N184 Chronic kidney disease, stage 4 (severe): Secondary | ICD-10-CM | POA: Diagnosis not present

## 2017-01-08 DIAGNOSIS — I129 Hypertensive chronic kidney disease with stage 1 through stage 4 chronic kidney disease, or unspecified chronic kidney disease: Secondary | ICD-10-CM | POA: Diagnosis not present

## 2017-01-08 DIAGNOSIS — K219 Gastro-esophageal reflux disease without esophagitis: Secondary | ICD-10-CM | POA: Diagnosis not present

## 2017-01-08 DIAGNOSIS — F339 Major depressive disorder, recurrent, unspecified: Secondary | ICD-10-CM | POA: Diagnosis not present

## 2017-01-08 DIAGNOSIS — D509 Iron deficiency anemia, unspecified: Secondary | ICD-10-CM

## 2017-01-08 DIAGNOSIS — Z79899 Other long term (current) drug therapy: Secondary | ICD-10-CM

## 2017-01-08 DIAGNOSIS — Z8719 Personal history of other diseases of the digestive system: Secondary | ICD-10-CM

## 2017-01-08 DIAGNOSIS — N8110 Cystocele, unspecified: Secondary | ICD-10-CM

## 2017-01-08 DIAGNOSIS — M1A3220 Chronic gout due to renal impairment, left elbow, without tophus (tophi): Secondary | ICD-10-CM

## 2017-01-08 DIAGNOSIS — H8110 Benign paroxysmal vertigo, unspecified ear: Secondary | ICD-10-CM

## 2017-01-08 DIAGNOSIS — Z2821 Immunization not carried out because of patient refusal: Secondary | ICD-10-CM | POA: Diagnosis not present

## 2017-01-08 DIAGNOSIS — M1A0220 Idiopathic chronic gout, left elbow, without tophus (tophi): Secondary | ICD-10-CM

## 2017-01-08 DIAGNOSIS — I10 Essential (primary) hypertension: Secondary | ICD-10-CM

## 2017-01-08 DIAGNOSIS — F32A Depression, unspecified: Secondary | ICD-10-CM

## 2017-01-08 DIAGNOSIS — Z0000 Encounter for general adult medical examination without abnormal findings: Secondary | ICD-10-CM

## 2017-01-08 DIAGNOSIS — N898 Other specified noninflammatory disorders of vagina: Secondary | ICD-10-CM

## 2017-01-08 MED ORDER — FUROSEMIDE 80 MG PO TABS: 40.0000 mg | ORAL_TABLET | Freq: Every day | ORAL | 2 refills | Status: DC

## 2017-01-08 MED ORDER — OMEPRAZOLE 20 MG PO CPDR: 20.0000 mg | DELAYED_RELEASE_CAPSULE | Freq: Two times a day (BID) | ORAL | 1 refills | Status: DC

## 2017-01-08 MED ORDER — BUPROPION HCL ER (XL) 150 MG PO TB24: 150.0000 mg | ORAL_TABLET | Freq: Every day | ORAL | 1 refills | Status: DC

## 2017-01-08 NOTE — Assessment & Plan Note (Signed)
-  This problem is chronic and stable -Her symptoms are stable on bupropion -We will refill this for her today

## 2017-01-08 NOTE — Assessment & Plan Note (Signed)
BP Readings from Last 3 Encounters:  01/08/17 134/66  09/21/16 123/63  09/14/16 (!) 123/59    Lab Results  Component Value Date   NA 140 09/11/2016   K 4.4 09/11/2016   CREATININE 1.85 (H) 09/11/2016    Assessment: Blood pressure control:  Well-controlled Progress toward BP goal:   At goal Comments: Patient is compliant with metoprolol 25 mg twice daily as well as amlodipine 10 mg once daily  Plan: Medications:  continue current medications Educational resources provided: (denies need ) Self management tools provided:   Other plans: We will check BMP today

## 2017-01-08 NOTE — Progress Notes (Signed)
   Subjective:    Patient ID: [name], female    DOB: 1934/08/17, 83 y.o.   MRN: [id number]  HPI  I seen and examined this patient.  Patient is here for routine follow-up of her hypertension and CKD.  Patient feels well otherwise with no new complaints.  She states she is compliant with her medications but wants to discuss whether she needs to continue taking all of them.  Patient also was diagnosed with a vaginal prolapse in June of last year.  She would like a referral to gynecology for further workup.   Review of Systems  Constitutional: Negative.   HENT: Negative.   Respiratory: Negative.   Cardiovascular: Negative.   Gastrointestinal: Negative.   Musculoskeletal: Negative.   Neurological: Negative.  Negative for dizziness.  Psychiatric/Behavioral: Negative.        Objective:   Physical Exam  Constitutional: She is oriented to person, place, and time. She appears well-developed and well-nourished.  HENT:  Head: Normocephalic and atraumatic.  Mouth/Throat: No oropharyngeal exudate.  Neck: Neck supple.  Cardiovascular: Normal rate, regular rhythm and normal heart sounds.  Pulmonary/Chest: Effort normal and breath sounds normal. No respiratory distress. She has no wheezes.  Abdominal: Soft. Bowel sounds are normal. She exhibits no distension. There is no tenderness.  Musculoskeletal: Normal range of motion. She exhibits no edema.  Lymphadenopathy:    She has no cervical adenopathy.  Neurological: She is alert and oriented to person, place, and time.  Skin: Skin is warm. No rash noted. No erythema.  Psychiatric: She has a normal mood and affect. Her behavior is normal.          Assessment & Plan:  Please see problem based charting for assessment and plan:

## 2017-01-08 NOTE — Assessment & Plan Note (Signed)
-  Patient states that she is now asymptomatic and has not had further episodes of vertigo -She has also stopped taking the meclizine -She states that she went to vestibular PT only once and was told that she did not need to follow-up -We will continue to monitor her off medications

## 2017-01-08 NOTE — Assessment & Plan Note (Signed)
-  Patient continues to refuse flu shot as well as pneumonia shot

## 2017-01-08 NOTE — Assessment & Plan Note (Addendum)
-  We will check a CBC today -Her last blood counts have been within normal limits -She denies fatigue or palpitations or chest pain  Addendum: - Her CBC was wnl - No further work up for now

## 2017-01-08 NOTE — Assessment & Plan Note (Signed)
-  Patient developed vaginal prolapse last year and has not yet followed up with gynecology -I have placed a referral today for gynecology and she will follow-up with them on her return from vacation -I also spoke with the granddaughter who will accompany her to that appointment and the nurse states that she will call both the granddaughter and the patient once the appointment is made

## 2017-01-08 NOTE — Assessment & Plan Note (Signed)
-  This problem is chronic and stable -Patient is compliant with allopurinol -She has had no further episodes of gout -We will continue with allopurinol for now.  Refills called into her pharmacy

## 2017-01-08 NOTE — Assessment & Plan Note (Addendum)
-  Patient follows up with Dr. Moshe Cipro in [facility] -Her renal function has been stable over the last several years -We will check her CBC, BMP and her intact PTH today per Dr. Shelva Majestic request -No further workup for now  Addendum: - Her creatinine has worsened to 3.05 from 1.85 4 months ago - We will fax the results to her nephrologist - Dr. Moshe Cipro -Results discussed with patient on phone. Patient to call Dr. Moshe Cipro today and arrange follow up.

## 2017-01-08 NOTE — Patient Instructions (Addendum)
-   It was a pleasure seeing you today - Please continue taking your omeprazole for your reflux for now. - We will check blood work today and fax the results to Dr. Moshe Cipro as well - We will refer you to a gynecologist for your vaginal prolapse - I have refilled your lasix and bupropion - Please call me with any questions

## 2017-01-08 NOTE — Assessment & Plan Note (Signed)
-  Patient states that her reflux symptoms are well controlled on the omeprazole -She does want to try stopping the medication to see if she really needs it or not -However, patient did have a visit to the ED in September last year for acute gastritis requiring sucralfate in addition to her PPI -Given this recent episode of gastritis I would like to continue her on her omeprazole for now.  Patient is in agreement

## 2017-01-09 LAB — CBC WITH DIFFERENTIAL/PLATELET
Basophils Absolute: 0 10*3/uL (ref 0.0–0.2)
Basos: 0 %
EOS (ABSOLUTE): 0.4 10*3/uL (ref 0.0–0.4)
Eos: 7 %
Hematocrit: 39.7 % (ref 34.0–46.6)
Hemoglobin: 12.8 g/dL (ref 11.1–15.9)
Immature Grans (Abs): 0 10*3/uL (ref 0.0–0.1)
Immature Granulocytes: 0 %
Lymphocytes Absolute: 1.6 10*3/uL (ref 0.7–3.1)
Lymphs: 33 %
MCH: 31.1 pg (ref 26.6–33.0)
MCHC: 32.2 g/dL (ref 31.5–35.7)
MCV: 97 fL (ref 79–97)
Monocytes Absolute: 0.4 10*3/uL (ref 0.1–0.9)
Monocytes: 8 %
Neutrophils Absolute: 2.5 10*3/uL (ref 1.4–7.0)
Neutrophils: 52 %
Platelets: 228 10*3/uL (ref 150–379)
RBC: 4.11 x10E6/uL (ref 3.77–5.28)
RDW: 15.8 % — ABNORMAL HIGH (ref 12.3–15.4)
WBC: 4.9 10*3/uL (ref 3.4–10.8)

## 2017-01-09 LAB — PTH, INTACT AND CALCIUM
Calcium: 10 mg/dL (ref 8.7–10.3)
PTH: 75 pg/mL — ABNORMAL HIGH (ref 15–65)

## 2017-01-09 LAB — BMP8+ANION GAP
Anion Gap: 17 mmol/L (ref 10.0–18.0)
BUN/Creatinine Ratio: 17 (ref 12–28)
BUN: 52 mg/dL — ABNORMAL HIGH (ref 8–27)
CO2: 22 mmol/L (ref 20–29)
Calcium: 10.1 mg/dL (ref 8.7–10.3)
Chloride: 103 mmol/L (ref 96–106)
Creatinine, Ser: 3.06 mg/dL — ABNORMAL HIGH (ref 0.57–1.00)
GFR calc Af Amer: 16 mL/min/{1.73_m2} — ABNORMAL LOW (ref >59)
GFR calc non Af Amer: 14 mL/min/{1.73_m2} — ABNORMAL LOW (ref >59)
Glucose: 105 mg/dL — ABNORMAL HIGH (ref 65–99)
Potassium: 4.7 mmol/L (ref 3.5–5.2)
Sodium: 142 mmol/L (ref 134–144)

## 2017-01-09 NOTE — Progress Notes (Signed)
Results of Labs ordered by Dr. Corliss Parish faxed to [phone] on 01/09/17 by Renato Battles at 12:22pm.

## 2017-01-30 ENCOUNTER — Other Ambulatory Visit: Payer: Self-pay

## 2017-01-30 DIAGNOSIS — Z87898 Personal history of other specified conditions: Secondary | ICD-10-CM

## 2017-01-30 MED ORDER — MECLIZINE HCL 12.5 MG PO TABS: ORAL_TABLET | ORAL | 0 refills | Status: DC

## 2017-01-30 NOTE — Telephone Encounter (Signed)
Requesting a med for dizzy spell. Please call pt back.

## 2017-01-30 NOTE — Telephone Encounter (Signed)
She is no longer on this medication. I am unsure how beneficial this would be. Will give her 30 pills but no refills. If she gets recurrent dizziness she needs to come in to get evaluated. She ahs a history of BPPV and would benefit more from the Epley maneuver than meclizine

## 2017-01-30 NOTE — Telephone Encounter (Signed)
Message given to pt

## 2017-02-11 DIAGNOSIS — N8111 Cystocele, midline: Secondary | ICD-10-CM | POA: Diagnosis not present

## 2017-03-22 DIAGNOSIS — N8111 Cystocele, midline: Secondary | ICD-10-CM | POA: Diagnosis not present

## 2017-03-25 DIAGNOSIS — N8111 Cystocele, midline: Secondary | ICD-10-CM | POA: Diagnosis not present

## 2017-04-16 DIAGNOSIS — N8111 Cystocele, midline: Secondary | ICD-10-CM | POA: Diagnosis not present

## 2017-05-29 ENCOUNTER — Other Ambulatory Visit: Payer: Self-pay

## 2017-05-29 ENCOUNTER — Other Ambulatory Visit (HOSPITAL_COMMUNITY): Payer: Self-pay

## 2017-05-30 ENCOUNTER — Emergency Department (HOSPITAL_COMMUNITY): Payer: Medicare Other

## 2017-05-30 ENCOUNTER — Observation Stay (HOSPITAL_COMMUNITY)
Admission: EM | Admit: 2017-05-30 | Discharge: 2017-05-31 | Disposition: A | Discharge status: Home / Self Care | Payer: Medicare Other | Attending: Internal Medicine | Admitting: Internal Medicine

## 2017-05-30 ENCOUNTER — Other Ambulatory Visit: Payer: Self-pay

## 2017-05-30 ENCOUNTER — Encounter (HOSPITAL_COMMUNITY): Payer: Self-pay

## 2017-05-30 DIAGNOSIS — R0902 Hypoxemia: Secondary | ICD-10-CM | POA: Diagnosis not present

## 2017-05-30 DIAGNOSIS — M109 Gout, unspecified: Secondary | ICD-10-CM | POA: Insufficient documentation

## 2017-05-30 DIAGNOSIS — R079 Chest pain, unspecified: Secondary | ICD-10-CM | POA: Diagnosis not present

## 2017-05-30 DIAGNOSIS — R0789 Other chest pain: Secondary | ICD-10-CM | POA: Diagnosis not present

## 2017-05-30 DIAGNOSIS — Z992 Dependence on renal dialysis: Secondary | ICD-10-CM | POA: Diagnosis not present

## 2017-05-30 DIAGNOSIS — Z8673 Personal history of transient ischemic attack (TIA), and cerebral infarction without residual deficits: Secondary | ICD-10-CM

## 2017-05-30 DIAGNOSIS — Z886 Allergy status to analgesic agent status: Secondary | ICD-10-CM | POA: Insufficient documentation

## 2017-05-30 DIAGNOSIS — I129 Hypertensive chronic kidney disease with stage 1 through stage 4 chronic kidney disease, or unspecified chronic kidney disease: Secondary | ICD-10-CM | POA: Diagnosis not present

## 2017-05-30 DIAGNOSIS — R072 Precordial pain: Secondary | ICD-10-CM | POA: Diagnosis not present

## 2017-05-30 DIAGNOSIS — D509 Iron deficiency anemia, unspecified: Secondary | ICD-10-CM | POA: Diagnosis not present

## 2017-05-30 DIAGNOSIS — F329 Major depressive disorder, single episode, unspecified: Secondary | ICD-10-CM | POA: Diagnosis not present

## 2017-05-30 DIAGNOSIS — G4700 Insomnia, unspecified: Secondary | ICD-10-CM | POA: Diagnosis not present

## 2017-05-30 DIAGNOSIS — I319 Disease of pericardium, unspecified: Secondary | ICD-10-CM | POA: Diagnosis present

## 2017-05-30 DIAGNOSIS — I10 Essential (primary) hypertension: Secondary | ICD-10-CM | POA: Diagnosis not present

## 2017-05-30 DIAGNOSIS — K219 Gastro-esophageal reflux disease without esophagitis: Secondary | ICD-10-CM | POA: Insufficient documentation

## 2017-05-30 DIAGNOSIS — Z800 Family history of malignant neoplasm of digestive organs: Secondary | ICD-10-CM | POA: Insufficient documentation

## 2017-05-30 DIAGNOSIS — R0602 Shortness of breath: Secondary | ICD-10-CM | POA: Diagnosis not present

## 2017-05-30 DIAGNOSIS — Z79899 Other long term (current) drug therapy: Secondary | ICD-10-CM | POA: Diagnosis not present

## 2017-05-30 DIAGNOSIS — N184 Chronic kidney disease, stage 4 (severe): Secondary | ICD-10-CM | POA: Insufficient documentation

## 2017-05-30 DIAGNOSIS — I313 Pericardial effusion (noninflammatory): Secondary | ICD-10-CM | POA: Diagnosis not present

## 2017-05-30 DIAGNOSIS — J849 Interstitial pulmonary disease, unspecified: Secondary | ICD-10-CM | POA: Diagnosis not present

## 2017-05-30 DIAGNOSIS — F32A Depression, unspecified: Secondary | ICD-10-CM | POA: Diagnosis present

## 2017-05-30 HISTORY — DX: Other chest pain: R07.89

## 2017-05-30 LAB — BASIC METABOLIC PANEL
Anion gap: 10 (ref 5–15)
BUN: 54 mg/dL — ABNORMAL HIGH (ref 6–20)
CO2: 22 mmol/L (ref 22–32)
Calcium: 9.5 mg/dL (ref 8.9–10.3)
Chloride: 109 mmol/L (ref 101–111)
Creatinine, Ser: 2.57 mg/dL — ABNORMAL HIGH (ref 0.44–1.00)
GFR calc Af Amer: 19 mL/min — ABNORMAL LOW (ref >60)
GFR calc non Af Amer: 16 mL/min — ABNORMAL LOW (ref >60)
Glucose, Bld: 105 mg/dL — ABNORMAL HIGH (ref 65–99)
Potassium: 4.5 mmol/L (ref 3.5–5.1)
Sodium: 141 mmol/L (ref 135–145)

## 2017-05-30 LAB — CBC WITH DIFFERENTIAL/PLATELET
Abs Immature Granulocytes: 0 10*3/uL (ref 0.0–0.1)
Basophils Absolute: 0 10*3/uL (ref 0.0–0.1)
Basophils Relative: 0 %
Eosinophils Absolute: 0.4 10*3/uL (ref 0.0–0.7)
Eosinophils Relative: 6 %
HCT: 37.7 % (ref 36.0–46.0)
Hemoglobin: 12.2 g/dL (ref 12.0–15.0)
Immature Granulocytes: 0 %
Lymphocytes Relative: 40 %
Lymphs Abs: 2.8 10*3/uL (ref 0.7–4.0)
MCH: 31.5 pg (ref 26.0–34.0)
MCHC: 32.4 g/dL (ref 30.0–36.0)
MCV: 97.4 fL (ref 78.0–100.0)
Monocytes Absolute: 0.7 10*3/uL (ref 0.1–1.0)
Monocytes Relative: 9 %
Neutro Abs: 3.1 10*3/uL (ref 1.7–7.7)
Neutrophils Relative %: 45 %
Platelets: 209 10*3/uL (ref 150–400)
RBC: 3.87 MIL/uL (ref 3.87–5.11)
RDW: 13.5 % (ref 11.5–15.5)
WBC: 7 10*3/uL (ref 4.0–10.5)

## 2017-05-30 LAB — LIPASE, BLOOD: Lipase: 60 U/L — ABNORMAL HIGH (ref 11–51)

## 2017-05-30 LAB — TROPONIN I
Troponin I: 0.03 ng/mL (ref <0.03)
Troponin I: 0.03 ng/mL (ref <0.03)
Troponin I: 0.03 ng/mL (ref <0.03)

## 2017-05-30 LAB — HEPATIC FUNCTION PANEL
ALT: 18 U/L (ref 14–54)
AST: 19 U/L (ref 15–41)
Albumin: 3.9 g/dL (ref 3.5–5.0)
Alkaline Phosphatase: 176 U/L — ABNORMAL HIGH (ref 38–126)
Bilirubin, Direct: <0.1 mg/dL — ABNORMAL LOW (ref 0.1–0.5)
Total Bilirubin: 0.4 mg/dL (ref 0.3–1.2)
Total Protein: 7.7 g/dL (ref 6.5–8.1)

## 2017-05-30 LAB — BRAIN NATRIURETIC PEPTIDE: B Natriuretic Peptide: 341.5 pg/mL — ABNORMAL HIGH (ref 0.0–100.0)

## 2017-05-30 MED ORDER — PANTOPRAZOLE SODIUM 20 MG PO TBEC
20.0000 mg | DELAYED_RELEASE_TABLET | Freq: Once | ORAL | Status: AC
  Administered 2017-05-30: 20 mg via ORAL
  Filled 2017-05-30 (×2): qty 1

## 2017-05-30 MED ORDER — METOPROLOL TARTRATE 25 MG PO TABS
25.0000 mg | ORAL_TABLET | Freq: Two times a day (BID) | ORAL | Status: DC
  Administered 2017-05-30 – 2017-05-31 (×3): 25 mg via ORAL
  Filled 2017-05-30 (×3): qty 1

## 2017-05-30 MED ORDER — ASPIRIN 81 MG PO CHEW
324.0000 mg | CHEWABLE_TABLET | Freq: Once | ORAL | Status: AC
  Administered 2017-05-30: 324 mg via ORAL
  Filled 2017-05-30: qty 4

## 2017-05-30 MED ORDER — AMLODIPINE BESYLATE 10 MG PO TABS
10.0000 mg | ORAL_TABLET | Freq: Every day | ORAL | Status: DC
  Administered 2017-05-30: 10 mg via ORAL
  Filled 2017-05-30: qty 1

## 2017-05-30 MED ORDER — BUPROPION HCL ER (XL) 150 MG PO TB24
150.0000 mg | ORAL_TABLET | Freq: Every day | ORAL | Status: DC
  Administered 2017-05-30 – 2017-05-31 (×2): 150 mg via ORAL
  Filled 2017-05-30 (×2): qty 1

## 2017-05-30 MED ORDER — ASPIRIN EC 81 MG PO TBEC
81.0000 mg | DELAYED_RELEASE_TABLET | Freq: Every day | ORAL | Status: DC
  Administered 2017-05-31: 81 mg via ORAL
  Filled 2017-05-30 (×2): qty 1

## 2017-05-30 MED ORDER — ALLOPURINOL 300 MG PO TABS
150.0000 mg | ORAL_TABLET | Freq: Every day | ORAL | Status: DC
  Administered 2017-05-30 – 2017-05-31 (×2): 150 mg via ORAL
  Filled 2017-05-30 (×2): qty 1

## 2017-05-30 MED ORDER — GI COCKTAIL ~~LOC~~
30.0000 mL | Freq: Three times a day (TID) | ORAL | Status: DC
  Administered 2017-05-30 – 2017-05-31 (×4): 30 mL via ORAL
  Filled 2017-05-30 (×4): qty 30

## 2017-05-30 MED ORDER — ONDANSETRON HCL 4 MG/2ML IJ SOLN: 4.0000 mg | Freq: Four times a day (QID) | INTRAMUSCULAR | Status: DC | PRN

## 2017-05-30 MED ORDER — ACETAMINOPHEN 325 MG PO TABS: 650.0000 mg | ORAL_TABLET | ORAL | Status: DC | PRN

## 2017-05-30 MED ORDER — PANTOPRAZOLE SODIUM 40 MG PO TBEC
40.0000 mg | DELAYED_RELEASE_TABLET | Freq: Every day | ORAL | Status: DC
  Administered 2017-05-30 – 2017-05-31 (×2): 40 mg via ORAL
  Filled 2017-05-30 (×2): qty 1

## 2017-05-30 MED ORDER — HEPARIN SODIUM (PORCINE) 5000 UNIT/ML IJ SOLN
5000.0000 [IU] | Freq: Three times a day (TID) | INTRAMUSCULAR | Status: DC
  Administered 2017-05-30 – 2017-05-31 (×3): 5000 [IU] via SUBCUTANEOUS
  Filled 2017-05-30 (×3): qty 1

## 2017-05-30 MED ORDER — FAMOTIDINE IN NACL 20-0.9 MG/50ML-% IV SOLN
20.0000 mg | Freq: Once | INTRAVENOUS | Status: AC
  Administered 2017-05-30: 20 mg via INTRAVENOUS
  Filled 2017-05-30: qty 50

## 2017-05-30 NOTE — ED Provider Notes (Signed)
[facility] EMERGENCY DEPARTMENT Provider Note   CSN: [id number] Arrival date & time: 05/30/17  2440     History   Chief Complaint Chief Complaint  Patient presents with  . Shortness of Breath    HPI [name] is a 83 y.o. female.  HPI Chest pain started yesterday.  Gradual in onset.  At first, patient thought might be indigestion or gas.  She tried Tums and drinking water without relief.  She indicates pain in her epigastrium and left lower chest.  Full aching quality.  She reports at times it would go away but then come back again.  It awakened her again this morning.  It improved after medics came and placed her on oxygen.  She reports feeling somewhat short of breath.  She reports she did very little yesterday.  No lower extremity swelling or calf pain.  No fever no cough.  She reports she has something similar about 7 years ago and they had to drain some "fluid off".  She could not specify what was done or where the "fluid" was taken from, but past medical history list indicates pericardial window.  Patient reports she has history of renal failure on dialysis about 10 years ago.  She reports her kidney function recovered.  She is not currently on dialysis. Past Medical History:  Diagnosis Date  . Anemia due to blood loss, chronic   . Anemia, iron deficiency   . Angiodysplasia of colon 05/09/2005   Single small angiodysplastic lesion in the descending colon found on colonoscopy 05/09/2005 by [name]  . Arthritis    "knots on my thumbs; left elbow" (03/03/2014)  . Cataracts, bilateral   . Chronic ankle pain    bilateral  . Chronic kidney disease, stage IV (severe) ([place])    [name] 02/09/2014  . Chronic knee pain    bilateral  . Chronic thumb pain   . Depression   . Epicondylitis   . Gastritis, Helicobacter pylori    Noted on EGD 01/18/2009 by [name];  biopsy showed chronic gastritis with Helicobacter pylori, treated by [name].  [name]  GERD (gastroesophageal reflux disease)   . Gout   . Hemorrhoids, internal 05/09/2005    Found on colonoscopy 05/09/2005 by [name]  . History of blood transfusion    "low blood count"  . Hypertension   . Osteoporosis, unspecified 03/06/2012   A DXA bone density scan done 02/20/2012 showed osteoporosis with a lumbar spine T-score of -4.4 and a right femur T-score of -2.8.  Patient has chronic kidney disease with estimated GFR less than 30, and it is not clear how much of her osteoporosis may be due to renal osteodystrophy.    . Pericardial effusion 11/2004   S/P subxiphoid pericardial window 11/01/2004 by [name].  A question of possible collagen vascular disease had been raised in 2006 due to arthralgias, a history of idiopathic pericarditis, and increased pigmentation of her palms, and she was referred to [name] but he was unable to confirm a rheumatologic diagnosis.  . Pinguecula   . Renal failure    Formerly on hemodialysis; managed by Dr. Corliss Parish  . Stiffness of joint, not elsewhere classified, hand   . Stroke [facility]) 2007   "when my kidneys went out & I was in a coma; weak LUE/LLE since"  . Thigh pain   . Vitreous detachment    "no OR"    Patient Active Problem List  Diagnosis Date Noted  . Benign positional vertigo 09/21/2016  . Preventative health care 09/11/2016  . Vaginal mass 07/19/2016  . History of vertigo 04/10/2016  . Insomnia 05/03/2015  . GERD (gastroesophageal reflux disease) 05/03/2015  . Fatigue 04/20/2015  . Meningioma ([place]) 03/01/2015  . History of CVA (cerebrovascular accident) 01/24/2015  . Gout of left elbow 03/03/2014  . Hearing loss 11/03/2013  . Osteoporosis 03/06/2012  . Depression 08/30/2009  . Iron deficiency anemia 06/01/2009  . History of Pericardial Effusion 06/01/2009  . CKD (chronic kidney disease) stage 4, GFR 15-29 ml/min ([facility]) 06/01/2009  . CATARACTS 10/24/2005  . Essential hypertension 10/24/2005  .  ANGIODYSPLASIA, COLON 05/09/2005    Past Surgical History:  Procedure Laterality Date  . ABDOMINAL HYSTERECTOMY  1960's  . ARTERIOVENOUS GRAFT PLACEMENT Left 11/28/2005   forearm arteriovenous  . CARDIAC CATHETERIZATION  01/2005   [name] 05/15/2010  . HALLUX VALGUS CORRECTION Left 01/2012   foot  . SUBXYPHOID PERICARDIAL WINDOW  11/01/2004  . THROMBECTOMY Left  03/06/2006   Simple thrombectomy arteriovenous Gore-Tex graft, left arm, with exploration of venous end and intraoperative shuntogram.         . THROMBECTOMY / ARTERIOVENOUS GRAFT REVISION Left 01/21/2006   Thrombectomy and revision of left forearm loop AV graft.  . TUBAL LIGATION  1960's   "before hysterectomy"     OB History   None      Home Medications    Prior to Admission medications   Medication Sig Start Date End Date Taking? Authorizing Provider  allopurinol (ZYLOPRIM) 100 MG tablet TAKE 1 & 1/2 (ONE & ONE-HALF) TABLETS BY MOUTH ONCE DAILY 09/06/16   [name], MD  amLODipine (NORVASC) 10 MG tablet Take 1 tablet (10 mg total) by mouth at bedtime. 09/11/16   [name], MD  buPROPion (WELLBUTRIN XL) 150 MG 24 hr tablet Take 1 tablet (150 mg total) by mouth daily. 01/08/17   [name], MD  furosemide (LASIX) 80 MG tablet Take 0.5 tablets (40 mg total) by mouth daily. 01/08/17   [name], MD  meclizine (ANTIVERT) 12.5 MG tablet TAKE 1 TABLET BY MOUTH TWICE DAILY AS NEEDED FOR DIZZINESS 01/30/17   [name], MD  metoprolol tartrate (LOPRESSOR) 25 MG tablet TAKE ONE TABLET BY MOUTH TWICE DAILY 10/22/16   [name], MD  omeprazole (PRILOSEC) 20 MG capsule Take 1 capsule (20 mg total) by mouth 2 (two) times daily. 01/08/17   [name], MD  SENSIPAR 30 MG tablet TAKE 1 TABLET BY MOUTH ONCE DAILY Patient not taking: Reported on 10/24/2016 10/02/16   [name], MD  FLUoxetine (PROZAC) 10 MG tablet Take 1 tablet (10 mg total) by mouth daily. 03/07/11 03/12/11  [name],  MD    Family History Family History  Problem Relation Age of Onset  . Hypertension Brother   . Heart disease Father   . Diabetes Brother   . Breast cancer Neg Hx   . Colon cancer Neg Hx   . Lung cancer Neg Hx   . Cervical cancer Neg Hx   . Sickle cell anemia Neg Hx     Social History Social History   Tobacco Use  . Smoking status: Never Smoker  . Smokeless tobacco: Never Used  . Tobacco comment: patient stated has never smoked   Substance Use Topics  . Alcohol use: No    Alcohol/week: 0.0 oz  . Drug use: No     Allergies   Ibuprofen   Review of Systems Review of  Systems 10 Systems reviewed and are negative for acute change except as noted in the HPI.   Physical Exam Updated Vital Signs BP 138/82   Pulse 88   Temp 98 F (36.7 C) (Temporal)   Resp 19   Ht 5\' 2"  (1.575 m)   Wt 76.7 kg (169 lb)   SpO2 100%   BMI 30.91 kg/m   Physical Exam  Constitutional: She is oriented to person, place, and time. She appears well-developed and well-nourished. No distress.  HENT:  Head: Normocephalic and atraumatic.  Eyes: Pupils are equal, round, and reactive to light. EOM are normal.  Neck: Neck supple.  Cardiovascular: Normal rate, regular rhythm, normal heart sounds and intact distal pulses.  Borderline tachycardia.  Monitor shows sinus rhythm mid 90s.  No appreciable rub murmur gallop.  Pulmonary/Chest: Effort normal.  No respiratory distress.  Breath sounds do seem slightly soft to the bases.  No gross rail or rhonchi.  Rare expiratory wheeze.  Abdominal: Soft. Bowel sounds are normal. She exhibits no distension. There is tenderness.  Patient has a well-healed scar in the high epigastrium.  Patient endorses significant tenderness to palpation in the left upper quadrant below the ribs as well as in the epigastrium.  Right upper quadrant is nontender.  Lower abdomen is nontender.  Musculoskeletal: Normal range of motion. She exhibits no edema or tenderness.  Both  calves are soft and nontender.  No peripheral edema.  Skin condition excellent.  Neurological: She is alert and oriented to person, place, and time. She has normal strength. She exhibits normal muscle tone. Coordination normal. GCS eye subscore is 4. GCS verbal subscore is 5. GCS motor subscore is 6.  Skin: Skin is warm, dry and intact.  Psychiatric: She has a normal mood and affect.     ED Treatments / Results  Labs (all labs ordered are listed, but only abnormal results are displayed) Labs Reviewed  BASIC METABOLIC PANEL - Abnormal; Notable for the following components:      Result Value   Glucose, Bld 105 (*)    BUN 54 (*)    Creatinine, Ser 2.57 (*)    GFR calc non Af Amer 16 (*)    GFR calc Af Amer 19 (*)    All other components within normal limits  TROPONIN I - Abnormal; Notable for the following components:   Troponin I 0.03 (*)    All other components within normal limits  BRAIN NATRIURETIC PEPTIDE - Abnormal; Notable for the following components:   B Natriuretic Peptide 341.5 (*)    All other components within normal limits  HEPATIC FUNCTION PANEL - Abnormal; Notable for the following components:   Alkaline Phosphatase 176 (*)    Bilirubin, Direct <0.1 (*)    All other components within normal limits  LIPASE, BLOOD - Abnormal; Notable for the following components:   Lipase 60 (*)    All other components within normal limits  CBC WITH DIFFERENTIAL/PLATELET    EKG EKG Interpretation  Date/Time:  Thursday May 30 2017 06:42:55 EDT Ventricular Rate:  95 PR Interval:    QRS Duration: 88 QT Interval:  347 QTC Calculation: 437 R Axis:   52 Text Interpretation:  Sinus rhythm inferior ST depressions appear new Confirmed by Ezequiel Essex [phone]) on 05/30/2017 6:53:32 AM   Radiology Dg Chest Portable 1 View  Result Date: 05/30/2017 CLINICAL DATA:  Dyspnea EXAM: PORTABLE CHEST 1 VIEW COMPARISON:  02/21/2014 chest radiograph. FINDINGS: Stable cardiomediastinal  silhouette with mild cardiomegaly. No pneumothorax.  No pleural effusion. Cephalization of the pulmonary vasculature without overt pulmonary edema. No acute consolidative airspace disease. IMPRESSION: Stable mild cardiomegaly. Cephalization of the pulmonary vasculature without overt pulmonary edema. Active pulmonary disease. Electronically Signed   By: Ilona Sorrel M.D.   On: 05/30/2017 07:38    Procedures Procedures (including critical care time) No critical care time. Medications Ordered in ED Medications  aspirin chewable tablet 324 mg (324 mg Oral Given 05/30/17 0815)  famotidine (PEPCID) IVPB 20 mg premix (20 mg Intravenous New Bag/Given 05/30/17 0815)  pantoprazole (PROTONIX) EC tablet 20 mg (20 mg Oral Given 05/30/17 7062)     Initial Impression / Assessment and Plan / ED Course  I have reviewed the triage vital signs and the nursing notes.  Pertinent labs & imaging results that were available during my care of the patient were reviewed by me and considered in my medical decision making (see chart for details).     Consult: Reviewed with internal medicine resident for admission.  Final Clinical Impressions(s) / ED Diagnoses   Final diagnoses:  Precordial pain  Shortness of breath   Patient presents with left lower chest pain\epigastric pain since yesterday.  She also endorses some associated shortness of breath.  First troponin is not significantly elevated.  EKG shows no STEMI pattern with some nonspecific ST-T wave changes.  Patient will need rule out for MI.  She also has history of pericardial effusion.  At this time, chest x-ray does not show increased cardiac silhouette and friction rub is not audible.  His symptoms however are potentially similar and patient may need echocardiogram.  Other consideration for possible epigastric\intra-abdominal source.  Patient does not have peritoneal signs or surgical abdomen but there is certainly a component of high epigastric pain and left  upper quadrant pain.  Plan will be for admission to medical service for additional diagnostic and therapeutic management.  Patient was given aspirin, IV Pepcid and oral Protonix (oral based on shortages).  Pressures, respiratory status and mental status are stable. ED Discharge Orders    None       Charlesetta Shanks, MD 05/30/17 [phone]

## 2017-05-30 NOTE — ED Notes (Signed)
Pt refused bloodwork.  RN aware. 

## 2017-05-30 NOTE — H&P (Addendum)
Date: 05/30/2017               Patient Name:  [name] MRN: [id number]  DOB: 06/30/1934 Age / Sex: 83 y.o., female   PCP: [name], MD         Medical Service: Internal Medicine Teaching Service         Attending Physician: Dr. Oda Kilts, MD    First Contact: Dr. Aggie Hacker Pager: [phone]  Second Contact: Dr. Reesa Chew Pager: [phone]       After Hours (After 5p/  First Contact Pager: [phone]  weekends / holidays): Second Contact Pager: [phone]   Chief Complaint: Chest pain  History of Present Illness: This is a 83 y.o. woman with PMHx of HTN, arthritis, CKD 4 (previously on HD), depression, prior pericardial effusion s/p window 10+ years ago, iron deficiency anemia, GERD, prior CVA who presented to the ED with chest pain and SOB.  Her chest pain began last night around 8pm while at rest.  It is located sub-xiphoid and does not radiate.  It is described as a sharp, pressure-like pain.  Initially, she thought it was related to gas so she took an Alka-Seltzer with improvement.  She then ate dinner consisting of chicken, cabbage, and corn bread.  She went to lie down at 11:30 and had recurrence of her pain.  She tried to gain relief by changing positions and walking around.  This did not help and may have made her symptoms worse.  She eventually tried to go back to sleep but the chest pain was continuing and she now felt short of breath, prompting her to come in to the ED.  She further endorses that the pain is worse with palpation and constant while remaining located in the same area.  Upon further ROS, she denies nausea, vomiting, constipation, diarrhea, melena, dysuria, cough, fever, chills, palpitations, syncope.  She does endorse SOB.  She is concerned because the pain is reminding her of her prior pericardial effusion.  ED Course: Vitals upon arrival notable for BP 147/96, resting HR in the 90s, oxygen saturations 98% on room air, and afebrile.  Labs as below.  She  received aspirin, Pepcid and Protonix.  IMTS was called for admission for further management.  LABS:  CBC was normal. BMET = normal sodium and potassium, no acidosis, BUN 54, creatinine 2.5 which is fairly consistent with her prior measurements.  Anion gap is 10. Troponin = 0.03 BNP = 341 Lipase = 60   Meds:  Allopurinol Amlodipine Bupropion Lasix Meclizine PRN Metroprolol tartrate Prilosec  Allergies: Allergies as of 05/30/2017 - Review Complete 05/30/2017  Allergen Reaction Noted  . Ibuprofen Other (See Comments) 01/17/2014   Past Medical History:  Diagnosis Date  . Anemia due to blood loss, chronic   . Anemia, iron deficiency   . Angiodysplasia of colon 05/09/2005   Single small angiodysplastic lesion in the descending colon found on colonoscopy 05/09/2005 by [name]  . Arthritis    "knots on my thumbs; left elbow" (03/03/2014)  . Cataracts, bilateral   . Chronic ankle pain    bilateral  . Chronic kidney disease, stage IV (severe) ([place])    [name] 02/09/2014  . Chronic knee pain    bilateral  . Chronic thumb pain   . Depression   . Epicondylitis   . Gastritis, Helicobacter pylori    Noted on EGD 01/18/2009 by [name];  biopsy showed chronic gastritis with Helicobacter pylori, treated by  [name].  [name] GERD (gastroesophageal reflux disease)   . Gout   . Hemorrhoids, internal 05/09/2005    Found on colonoscopy 05/09/2005 by [name]  . History of blood transfusion    "low blood count"  . Hypertension   . Osteoporosis, unspecified 03/06/2012   A DXA bone density scan done 02/20/2012 showed osteoporosis with a lumbar spine T-score of -4.4 and a right femur T-score of -2.8.  Patient has chronic kidney disease with estimated GFR less than 30, and it is not clear how much of her osteoporosis may be due to renal osteodystrophy.    . Pericardial effusion 11/2004   S/P subxiphoid pericardial window 11/01/2004 by [name].  A question of  possible collagen vascular disease had been raised in 2006 due to arthralgias, a history of idiopathic pericarditis, and increased pigmentation of her palms, and she was referred to [name] but he was unable to confirm a rheumatologic diagnosis.  . Pinguecula   . Renal failure    Formerly on hemodialysis; managed by Dr. Corliss Parish  . Stiffness of joint, not elsewhere classified, hand   . Stroke [facility]) 2007   "when my kidneys went out & I was in a coma; weak LUE/LLE since"  . Thigh pain   . Vitreous detachment    "no OR"    Family History:  Family History  Problem Relation Age of Onset  . Hypertension Brother   . Heart disease Father   . Diabetes Brother   . Breast cancer Neg Hx   . Colon cancer Neg Hx   . Lung cancer Neg Hx   . Cervical cancer Neg Hx   . Sickle cell anemia Neg Hx     Social History:  Social History   Tobacco Use  . Smoking status: Never Smoker  . Smokeless tobacco: Never Used  . Tobacco comment: patient stated has never smoked   Substance Use Topics  . Alcohol use: No    Alcohol/week: 0.0 oz  . Drug use: No    Review of Systems: A complete ROS was negative except as per HPI.   Physical Exam: Blood pressure 129/74, pulse 77, temperature 98 F (36.7 C), temperature source Temporal, resp. rate 15, height 5\' 2"  (1.575 m), weight 169 lb (76.7 kg), SpO2 99 %. Physical Exam  Constitutional: She is oriented to person, place, and time and well-developed, well-nourished, and in no distress.  HENT:  Head: Normocephalic and atraumatic.  Mouth/Throat: Oropharynx is clear and moist. No oropharyngeal exudate.  Eyes: Conjunctivae and EOM are normal.  Neck: Normal range of motion. Neck supple. No JVD present.  Cardiovascular: Normal rate, regular rhythm and normal heart sounds. Exam reveals no friction rub.  No murmur heard. Pulmonary/Chest: Effort normal and breath sounds normal. No respiratory distress.  + minimal bibasilar fine crackles.     Abdominal: Soft. Bowel sounds are normal. There is tenderness. There is guarding. There is no rebound.  She has epigastric tenderness with guarding but no rebound.   Musculoskeletal: She exhibits edema.  Trace lower extremity edema that she reports is unchanged.  Her strength on the left is 4/5 upper and lower that she reports is residual from prior CVA.  Neurological: She is alert and oriented to person, place, and time. No cranial nerve deficit.  Skin: Skin is warm and dry.  Psychiatric: Mood and affect normal.     EKG: personally reviewed my interpretation is sinus rhythm with rate 95 and inferior ST depressions that  appear to be new.   CXR: personally reviewed my interpretation is no evidence of effusion, pneumothorax, or infiltrate.  There is some evidence of vascular congestion without edema.   Assessment & Plan by Problem: Active Problems:   Chest pain  Chest pain, shortness of breath History of Pericardial effusion Her chest pain sounds atypical in that it is worse with palpation, not relieved with rest, and does not really worsen with exertion.  She does have risk factors, however, for cardiac disease and some new ST depressions in the inferior leads on EKG compared to priors.  She reports no prior history of MI or known CAD.  In 2006, she had a subxiphoid pericardial window for an effusion.  She was referred to rheumatology at that time per chart review but it does not appear that any diagnosis was confirmed.  Her chest x-ray shows some cephalization of the vasculature and her BNP is slightly elevated in the setting of CKD 4.  She is prescribed Lasix 40mg  daily that appears to be more for peripheral edema and she reports this being currently stable.  Her heart score is 4-5. Overall,  given her history and symptoms worsening last evening after dinner, GERD as an etiology remains likely. - Telemetry - Trend troponins, EKG PRN chest pain.  Given her advanced CKD, invasive angiography  would probably be indicated only if her clinical picture changed greatly - Check Echo to r/o effusion and evaluate for any regional wall motion abnormality - Lasix 40mg  IV x 1.  Assess response - GI cocktail TID, continue PPI - Start ASA - I/O, daily weights  CKD 4 She has CKD and is followed by [facility] and Dr Moshe Cipro.  Her creatinine is around its baseline for the past several years. - Follow BMET  Hx of CVA With residual left sided weakness.  She is not on aspirin or statin therapy. - Start ASA 81mg  daily as above - Check lipids in morning.  Consider addition of statin  Depression Stable on Bupropion 150mg  daily - Continue Bupropion  GERD Home medications are Prilosec - Continue PPI - Add GI cocktails as above  HTN BP here is stable.  She is on metoprolol and amlodipine at home. - COntinue metoprolol tartrate 25mg  BID - Continue amlodipine 10mg  daily  Gout Stable on allopurinol - Continue allopurinol 150mg  daily  FEN Fluids: None Electrolytes: monitor Nutrition: Heart  DVT PPx: Heparin SQ  CODE: FULL     Dispo: Admit patient to Observation with expected length of stay less than 2 midnights.  SignedJule Ser, DO 05/30/2017, 9:07 AM  Pager: [phone]

## 2017-05-30 NOTE — ED Triage Notes (Signed)
Pt BIB [facility] for SOB x 3 days. Pt also c/o central chest pain when she takes a deep breath x 3 days. Denies fever, nausea, vomiting, abdominal pain, or dysuria. Dialysis patient 6 years ago but states "everything is fine now". Per EMS pt's O2 sats 70% RA then given 5 mg albuterol by EMS and currently 98%.

## 2017-05-30 NOTE — Discharge Summary (Addendum)
Name: [name] MRN: [id number] DOB: [birth year] 83 y.o. PCP: [name], MD  Date of Admission: 05/30/2017  6:40 AM Date of Discharge: 05/31/17 Attending Physician: Oda Kilts, MD  Discharge Diagnosis: 1. Chest pain, non-cardiac in origin  Principal Problem:   Chest pain, non-cardiac Active Problems:   Depression   Essential hypertension   Disease of pericardium   CKD (chronic kidney disease) stage 4, GFR 15-29 ml/min (HCC)   Gout of left elbow   History of CVA (cerebrovascular accident)   GERD (gastroesophageal reflux disease)   Discharge Medications: Allergies as of 05/31/2017      Reactions   Ibuprofen Other (See Comments)   PT limited intake because of chronic kidney DX      Medication List    TAKE these medications   allopurinol 100 MG tablet Commonly known as:  ZYLOPRIM TAKE 1 & 1/2 (ONE & ONE-HALF) TABLETS BY MOUTH ONCE DAILY   amLODipine 10 MG tablet Commonly known as:  NORVASC Take 1 tablet (10 mg total) by mouth at bedtime.   buPROPion 150 MG 24 hr tablet Commonly known as:  WELLBUTRIN XL Take 1 tablet (150 mg total) by mouth daily.   docusate sodium 100 MG capsule Commonly known as:  COLACE Take 100 mg by mouth daily. Notes to patient:  You did not receive this medication during your hospital stay. You may resume it after discharge.   furosemide 80 MG tablet Commonly known as:  LASIX Take 0.5 tablets (40 mg total) by mouth daily. Notes to patient:  You did not receive this medication during your hospital stay. You may resume it after discharge.   meclizine 12.5 MG tablet Commonly known as:  ANTIVERT TAKE 1 TABLET BY MOUTH TWICE DAILY AS NEEDED FOR DIZZINESS   metoprolol tartrate 25 MG tablet Commonly known as:  LOPRESSOR TAKE ONE TABLET BY MOUTH TWICE DAILY   multivitamin with minerals tablet Take 1 tablet by mouth daily. Centrum Silver   omeprazole 20 MG capsule Commonly known as:  PRILOSEC Take 1 capsule (20 mg  total) by mouth 2 (two) times daily.   SENSIPAR 30 MG tablet Generic drug:  cinacalcet TAKE 1 TABLET BY MOUTH ONCE DAILY Notes to patient:  You did not receive this medication during your hospital stay. You may resume it after discharge.       Disposition and follow-up:   [name] was discharged from [facility] in Good condition.  At the hospital follow up visit please address:  1.  Chest pain, non-cardiac in origin  -Pain atypical and epigastric, relieved by GI cocktails  -Troponins negative x 3, echocardiogram within normal limits (see full report below) -Continued Prilosec at discharge  -Recurrence of chest pain?   2.  Labs / imaging needed at time of follow-up: none   3.  Pending labs/ test needing follow-up: none   Follow-up Appointments: Follow-up Information    [name], MD. Call in 1 week(s).   Specialty:  Internal Medicine Why:  Please call the Internal Medicine Center to schedule a hospital follow up appointment.  Contact information: [place], [place] [id number] [phone]           Hospital Course by problem list: Principal Problem:   Chest pain, non-cardiac Active Problems:   Depression   Essential hypertension   Disease of pericardium   CKD (chronic kidney disease) stage 4, GFR 15-29 ml/min (HCC)   Gout of left elbow  History of CVA (cerebrovascular accident)   GERD (gastroesophageal reflux disease)   1. Chest Pain, non-cardiac  [name] presented to [facility] emergency department with chest pain and shortness of breath. The patient was hemodynamically stable upon arrival. I-stat troponin was 0.03 and EKG demonstrated sinus rhythm with inferior ST depressions, which were new from prior. Troponin levels were trended: 0.03, 0.03, 0.03. The patient received GI cocktails and this relieved her pain. Repeat EKG demonstrated normal sinus rhythm and no evidence of acute ischemia. Echocardiogram  demonstrated normal systolic function. The patient did not experience additional episodes of chest pain or shortness of breath while hospitalized. Her chest pain was likely GI in origin.   2.CKD 4 Serum creatinine was stable at baseline.   3. Depression Patient's mood was stable. Bupropion 150mg  daily was continued.   4. GERD The patient's epigastric/chest pain was likely secondary to GERD or indigestion. She was treated with PRN GI cocktails, which relieved her symptoms. Prilosec was resumed at discharge.   5. Hypertension Normotensive the duration of hospitalization. Metoprolol tartrate 25mg  BID and amlodipine 10mg  daily were continued at discharge.  6. Gout Stable, no evidence of acute flare. Resumed allopurinol 150mg  daily.   Discharge Vitals:   BP 115/72 (BP Location: Right Arm)   Pulse 67   Temp 98.3 F (36.8 C) (Oral)   Resp 18   Ht 5\' 2"  (1.575 m)   Wt 161 lb 11.2 oz (73.3 kg)   SpO2 98%   BMI 29.58 kg/m   Pertinent Labs, Studies, and Procedures:  CBC Latest Ref Rng & Units 05/30/2017 01/08/2017 04/20/2015  WBC 4.0 - 10.5 K/uL 7.0 4.9 5.2  Hemoglobin 12.0 - 15.0 g/dL 12.2 12.8 12.2  Hematocrit 36.0 - 46.0 % 37.7 39.7 37.1  Platelets 150 - 400 K/uL 209 228 222   BMP Latest Ref Rng & Units 05/31/2017 05/30/2017 01/08/2017  Glucose 65 - 99 mg/dL 99 105(H) -  BUN 6 - 20 mg/dL 46(H) 54(H) -  Creatinine 0.44 - 1.00 mg/dL 2.40(H) 2.57(H) -  BUN/Creat Ratio 12 - 28 - - -  Sodium 135 - 145 mmol/L 141 141 -  Potassium 3.5 - 5.1 mmol/L 4.4 4.5 -  Chloride 101 - 111 mmol/L 112(H) 109 -  CO2 22 - 32 mmol/L 23 22 -  Calcium 8.9 - 10.3 mg/dL 9.1 9.5 10.0   EXAM: PORTABLE CHEST 1 VIEW  COMPARISON:  02/21/2014 chest radiograph.  FINDINGS: Stable cardiomediastinal silhouette with mild cardiomegaly. No pneumothorax. No pleural effusion. Cephalization of the pulmonary vasculature without overt pulmonary edema. No acute consolidative airspace disease.  IMPRESSION: Stable  mild cardiomegaly. Cephalization of the pulmonary vasculature without overt pulmonary edema. Active pulmonary disease.  TRANSTHORACIC ECHOCARDIOGRAM Study Conclusions - Left ventricle: The cavity size was normal. Systolic function was   normal. The estimated ejection fraction was in the range of 55%   to 60%. Wall motion was normal; there were no regional wall   motion abnormalities. Left ventricular diastolic function   parameters were normal. - Mitral valve: Calcified annulus. There was mild regurgitation. - Atrial septum: No defect or patent foramen ovale was identified.  Discharge Instructions: Discharge Instructions    Call MD for:  extreme fatigue   Complete by:  As directed    Call MD for:  persistant dizziness or light-headedness   Complete by:  As directed    Call MD for:  persistant nausea and vomiting   Complete by:  As directed    Call MD for:  severe uncontrolled pain   Complete by:  As directed    Call MD for:  temperature >100.4   Complete by:  As directed    Diet - low sodium heart healthy   Complete by:  As directed    Discharge instructions   Complete by:  As directed    [name],   You did not have a heart attack. I believe your chest pain was due to indigestion or acid reflux. The ultrasound of your heart was also normal. You did not have a pericardial effusion.   Continue all medications as prescribed. I have made no changes to your medications.  Please follow up with Dr. Dareen Piano within one to two weeks from leaving the hospital.   Increase activity slowly   Complete by:  As directed       Signed: Melanee Spry, MD 05/31/2017, 2:57 PM   Pager: [phone]  Internal Medicine Attending Note:  I saw and examined the patient on the day of discharge. I reviewed and agree with the discharge summary written by the house staff.  Admitted for substernal/epigastric pain that was eventually relieved by GI cocktail, suggesting GERD or gastritis as  the likely etiology.  However, she did have ST depressions on her initial EKG.  Even with negative troponins, if there is concern for angina, it would be reasonable to consider outpatient stress testing or other coronary evaluation.  Lenice Pressman, M.D., Ph.D.

## 2017-05-30 NOTE — Progress Notes (Signed)
Pt arrived to 4e from [facility]. Pt oriented to room and staff. Vitals obtained. Telemetry applied and CCMD notified. Pt requesting food. Snacks provided and dinner tray ordered. Pt denies needs at this time. Will continue current plan of care.   Ara Kussmaul BSN, RN

## 2017-05-30 NOTE — ED Triage Notes (Signed)
PT had meal tray at bedside and Pt reported she could not eat that food.

## 2017-05-30 NOTE — ED Provider Notes (Signed)
MSE was initiated and I personally evaluated the patient and placed orders (if any) at  6:54 AM on May 30, 2017.  Patient presents via EMS with shortness of breath for the past 3 days as well as subxiphoid pain that is worse when she takes a deep breath.  She denies any fever, chills, cough.  EMS reports initial O2 sats were in the 70s but now 98% after albuterol.  She was not wheezing.  Patient is in no distress, speaking in full sentences. Lungs are clear, diminished at the bases. No significant peripheral edema.  EKG shows new inferior ST depressions. Patient with history of CKD, previously on dialysis but is no longer. She required a pericardial window many years ago for similar symptoms.  She denies any history of COPD, CHF or asthma.  Labs and chest x-ray ordered.  Patient 98% on room air at this time with clear lungs.   The patient appears stable so that the remainder of the MSE may be completed by another provider.   Ezequiel Essex, MD 05/30/17 [phone]

## 2017-05-30 NOTE — ED Notes (Signed)
Report called to [facility].

## 2017-05-31 ENCOUNTER — Observation Stay (HOSPITAL_BASED_OUTPATIENT_CLINIC_OR_DEPARTMENT_OTHER): Payer: Medicare Other

## 2017-05-31 DIAGNOSIS — I129 Hypertensive chronic kidney disease with stage 1 through stage 4 chronic kidney disease, or unspecified chronic kidney disease: Secondary | ICD-10-CM

## 2017-05-31 DIAGNOSIS — F339 Major depressive disorder, recurrent, unspecified: Secondary | ICD-10-CM | POA: Diagnosis not present

## 2017-05-31 DIAGNOSIS — Z886 Allergy status to analgesic agent status: Secondary | ICD-10-CM | POA: Diagnosis not present

## 2017-05-31 DIAGNOSIS — N184 Chronic kidney disease, stage 4 (severe): Secondary | ICD-10-CM

## 2017-05-31 DIAGNOSIS — K219 Gastro-esophageal reflux disease without esophagitis: Secondary | ICD-10-CM

## 2017-05-31 DIAGNOSIS — R0789 Other chest pain: Secondary | ICD-10-CM | POA: Diagnosis not present

## 2017-05-31 DIAGNOSIS — R0602 Shortness of breath: Secondary | ICD-10-CM | POA: Diagnosis not present

## 2017-05-31 DIAGNOSIS — Z992 Dependence on renal dialysis: Secondary | ICD-10-CM | POA: Diagnosis not present

## 2017-05-31 DIAGNOSIS — Z79899 Other long term (current) drug therapy: Secondary | ICD-10-CM | POA: Diagnosis not present

## 2017-05-31 DIAGNOSIS — R079 Chest pain, unspecified: Secondary | ICD-10-CM | POA: Diagnosis not present

## 2017-05-31 DIAGNOSIS — M109 Gout, unspecified: Secondary | ICD-10-CM

## 2017-05-31 LAB — BASIC METABOLIC PANEL
Anion gap: 6 (ref 5–15)
BUN: 46 mg/dL — ABNORMAL HIGH (ref 6–20)
CO2: 23 mmol/L (ref 22–32)
Calcium: 9.1 mg/dL (ref 8.9–10.3)
Chloride: 112 mmol/L — ABNORMAL HIGH (ref 101–111)
Creatinine, Ser: 2.4 mg/dL — ABNORMAL HIGH (ref 0.44–1.00)
GFR calc Af Amer: 21 mL/min — ABNORMAL LOW (ref >60)
GFR calc non Af Amer: 18 mL/min — ABNORMAL LOW (ref >60)
Glucose, Bld: 99 mg/dL (ref 65–99)
Potassium: 4.4 mmol/L (ref 3.5–5.1)
Sodium: 141 mmol/L (ref 135–145)

## 2017-05-31 LAB — ECHOCARDIOGRAM COMPLETE
Height: 62 in
Weight: 2587.2 oz

## 2017-05-31 NOTE — Care Management Obs Status (Signed)
[facility] NOTIFICATION   Patient Details  Name: [name] MRN: [id number] Date of Birth: 12-Apr-1934   Medicare Observation Status Notification Given:  Yes    Carles Collet, RN 05/31/2017, 11:47 AM

## 2017-05-31 NOTE — Progress Notes (Signed)
Pt discharged home with family. IV and telemetry box removed. Pt received discharge instructions and all questions were answered. Pt left with all of her belongings. Pt discharged via wheelchair and was accompanied by this RN.    Ara Kussmaul BSN, RN

## 2017-05-31 NOTE — Progress Notes (Addendum)
   Subjective:   Patient seen and examined. She is doing well this morning and is ready to be discharged home. She denied CP or abdominal pain and her symptoms have resolved.   Objective:  Vital signs in last 24 hours: Vitals:   05/30/17 1554 05/30/17 2127 05/31/17 0434 05/31/17 0928  BP: (!) 149/104 137/75 137/73 133/74  Pulse:  79 75 65  Resp: 14   19  Temp: 98.7 F (37.1 C) 98.6 F (37 C) 98.4 F (36.9 C)   TempSrc: Oral Oral Oral   SpO2: 98% 98%    Weight: 162 lb (73.5 kg)  161 lb 11.2 oz (73.3 kg)   Height: 5\' 2"  (1.575 m)      General: Laying in bed comfortably, NAD HEENT: [place]/AT, EOMI, no scleral icterus Ext: extremities well perfused, no peripheral edema Neuro: alert and oriented X3, cranial nerves II-XII grossly intact   Assessment/Plan:  Principal Problem:   Chest pain Active Problems:   Depression   Essential hypertension   Disease of pericardium   CKD (chronic kidney disease) stage 4, GFR 15-29 ml/min (HCC)   Gout of left elbow   History of CVA (cerebrovascular accident)   GERD (gastroesophageal reflux disease)  Chest pain, non-cardiac in origin Patient's pain resolved after receiving GI cocktail, likely related to indigestion. Repeat EKG without evidence of ischemic changes. ACS ruled out as Troponin negative x 3. TTE demonstrated EF -55-60% without wall motion abnormalities, no pericardial effusion. - Stable for discharge today - f/u with PCP within 1-2 weeks   CKD 4 She has CKD and is followed by [facility] and Dr Moshe Cipro.   -Creatinine stable   Depression Stable on Bupropion 150mg  daily -Continue Bupropion  GERD - Continue PPI  HTN BP stable.  She is on metoprolol and amlodipine at home. - Continue metoprolol tartrate 25mg  BID - Continue amlodipine 10mg  daily  Gout Stable on allopurinol - Continue allopurinol 150mg  daily  FEN Fluids: None Electrolytes: monitor Nutrition: Heart  DVT PPx: Heparin SQ  CODE: FULL     Dispo: Anticipated discharge today.   Melanee Spry, MD 05/31/2017, 11:55 AM Pager: [phone]

## 2017-05-31 NOTE — Progress Notes (Signed)
  Echocardiogram 2D Echocardiogram has been performed.  Jennette Dubin 05/31/2017, 8:46 AM

## 2017-06-12 ENCOUNTER — Other Ambulatory Visit: Payer: Self-pay | Admitting: Internal Medicine

## 2017-07-09 ENCOUNTER — Other Ambulatory Visit: Payer: Self-pay

## 2017-07-09 ENCOUNTER — Encounter: Payer: Self-pay | Admitting: Internal Medicine

## 2017-07-09 ENCOUNTER — Ambulatory Visit (INDEPENDENT_AMBULATORY_CARE_PROVIDER_SITE_OTHER): Payer: Medicare Other | Admitting: Internal Medicine

## 2017-07-09 VITALS — BP 155/74 | HR 63 | Temp 98.6°F | Ht 61.0 in | Wt 164.2 lb

## 2017-07-09 DIAGNOSIS — Z2821 Immunization not carried out because of patient refusal: Secondary | ICD-10-CM

## 2017-07-09 DIAGNOSIS — I10 Essential (primary) hypertension: Secondary | ICD-10-CM

## 2017-07-09 DIAGNOSIS — Z862 Personal history of diseases of the blood and blood-forming organs and certain disorders involving the immune mechanism: Secondary | ICD-10-CM

## 2017-07-09 DIAGNOSIS — R5383 Other fatigue: Secondary | ICD-10-CM | POA: Diagnosis not present

## 2017-07-09 DIAGNOSIS — N184 Chronic kidney disease, stage 4 (severe): Secondary | ICD-10-CM | POA: Diagnosis not present

## 2017-07-09 DIAGNOSIS — F339 Major depressive disorder, recurrent, unspecified: Secondary | ICD-10-CM

## 2017-07-09 DIAGNOSIS — F329 Major depressive disorder, single episode, unspecified: Secondary | ICD-10-CM

## 2017-07-09 DIAGNOSIS — K219 Gastro-esophageal reflux disease without esophagitis: Secondary | ICD-10-CM

## 2017-07-09 DIAGNOSIS — D509 Iron deficiency anemia, unspecified: Secondary | ICD-10-CM

## 2017-07-09 DIAGNOSIS — N898 Other specified noninflammatory disorders of vagina: Secondary | ICD-10-CM

## 2017-07-09 DIAGNOSIS — I129 Hypertensive chronic kidney disease with stage 1 through stage 4 chronic kidney disease, or unspecified chronic kidney disease: Secondary | ICD-10-CM

## 2017-07-09 DIAGNOSIS — Z79899 Other long term (current) drug therapy: Secondary | ICD-10-CM

## 2017-07-09 DIAGNOSIS — Z0000 Encounter for general adult medical examination without abnormal findings: Secondary | ICD-10-CM

## 2017-07-09 DIAGNOSIS — Z8742 Personal history of other diseases of the female genital tract: Secondary | ICD-10-CM

## 2017-07-09 DIAGNOSIS — F32A Depression, unspecified: Secondary | ICD-10-CM

## 2017-07-09 DIAGNOSIS — D329 Benign neoplasm of meninges, unspecified: Secondary | ICD-10-CM

## 2017-07-09 DIAGNOSIS — M1A3220 Chronic gout due to renal impairment, left elbow, without tophus (tophi): Secondary | ICD-10-CM

## 2017-07-09 DIAGNOSIS — H8110 Benign paroxysmal vertigo, unspecified ear: Secondary | ICD-10-CM

## 2017-07-09 DIAGNOSIS — M10022 Idiopathic gout, left elbow: Secondary | ICD-10-CM | POA: Diagnosis not present

## 2017-07-09 MED ORDER — FUROSEMIDE 80 MG PO TABS: 40.0000 mg | ORAL_TABLET | Freq: Every day | ORAL | 2 refills | Status: DC

## 2017-07-09 MED ORDER — AMLODIPINE BESYLATE 10 MG PO TABS: 10.0000 mg | ORAL_TABLET | Freq: Every day | ORAL | 3 refills | Status: DC

## 2017-07-09 MED ORDER — METOPROLOL TARTRATE 25 MG PO TABS: 12.5000 mg | ORAL_TABLET | Freq: Two times a day (BID) | ORAL | 3 refills | Status: DC

## 2017-07-09 NOTE — Assessment & Plan Note (Addendum)
-  This problem is chronic and stable -Given her worsening fatigue I will recheck her BMP today -No further work-up at this time -Patient states that she followed up recently with her nephrologist (Dr. Clover Mealy)  Addendum: - Patient's creatinine is at her baseline - Discussed results with patient - No further work up for now

## 2017-07-09 NOTE — Assessment & Plan Note (Signed)
-  This problem is chronic and stable -Patient states that she is only needed to use her meclizine intermittently and does not take this daily anymore -No further work-up at this time

## 2017-07-09 NOTE — Assessment & Plan Note (Signed)
BP Readings from Last 3 Encounters:  07/09/17 (!) 155/74  05/31/17 115/72  01/08/17 134/66    Lab Results  Component Value Date   NA 141 05/31/2017   K 4.4 05/31/2017   CREATININE 2.40 (H) 05/31/2017    Assessment: Blood pressure control:  Uncontrolled Progress toward BP goal:   Deteriorated Comments: Patient states that she has not taken her medications today as she makes her fatigue.  She is normally on metoprolol 25 mg twice daily, amlodipine 10 mg daily and Lasix 40 mg daily  Plan: Medications:  Will decrease metoprolol to 12.5 mg twice daily and continue her other medications for now Educational resources provided:   Self management tools provided:   Other plans: We will check BMP today.

## 2017-07-09 NOTE — Assessment & Plan Note (Signed)
-  Patient continues to refuse a pneumonia shot at this time -She also states that she no longer wants to mammograms as she is over 82 now and no longer needs this.  I am inclined to agree with her and will discontinue mammograms for now

## 2017-07-09 NOTE — Assessment & Plan Note (Addendum)
-  Patient states that she has had worsening fatigue over the last month and states that she feels better once she comes off all her medications -She still takes her medications daily unless she needs to go out somewhere in which case she will skip that -I explained to the patient that it is important to take all her medications and that she has been on most of these for a long time without issue -I will decrease her metoprolol to 12.5 mg twice daily as beta-blockers have been known to cause fatigue -I will check BMP, CBC and TSH to rule out other etiologies for her fatigue as well  Addendum: - Called patient to discuss the results of her blood work - Creatinine is at baseline, CBC is wnl and TSH is wnl as well - She states fatigue has improved with reduced metoprolol dose - No further work up at this time

## 2017-07-09 NOTE — Assessment & Plan Note (Signed)
-  Patient states that she has not had any recurrent issues with vaginal prolapse -She states that she did follow-up with a gynecologist who told her that she did not need any further work-up or follow-up for this -I explained to the patient that if this recurs it is important that she follows up with her gynecologist and she expressed understanding -No further work-up at this time

## 2017-07-09 NOTE — Progress Notes (Signed)
   Subjective:    Patient ID: [name], female    DOB: 06/22/1934, 83 y.o.   MRN: [id number]  HPI  I have seen and examined this patient.  Patient is here for routine follow-up of her hypertension and CKD.  Patient does complain of intermittent fatigue and states that when she does not take her medications her fatigue improves.  She denies any other complaints at this time and states that she is compliant with her medications unless she needs to go somewhere because she does not want to be fatigued.  Review of Systems  Constitutional: Positive for fatigue. Negative for activity change, appetite change and fever.  HENT: Negative.   Cardiovascular: Negative.   Gastrointestinal: Negative.   Musculoskeletal: Negative.   Neurological: Negative.   Psychiatric/Behavioral: Negative.        Objective:   Physical Exam  Constitutional: She is oriented to person, place, and time. She appears well-developed and well-nourished.  HENT:  Head: Normocephalic and atraumatic.  Mouth/Throat: No oropharyngeal exudate.  Neck: Neck supple.  Cardiovascular: Normal rate, regular rhythm and normal heart sounds.  Pulmonary/Chest: Effort normal and breath sounds normal. She has no wheezes. She has no rales.  Abdominal: Soft. Bowel sounds are normal. She exhibits no distension. There is no tenderness.  Musculoskeletal: Normal range of motion. She exhibits no edema.  Lymphadenopathy:    She has no cervical adenopathy.  Neurological: She is alert and oriented to person, place, and time.  Psychiatric: She has a normal mood and affect. Her behavior is normal.          Assessment & Plan:  Please see problem based charting for assessment and plan:

## 2017-07-09 NOTE — Assessment & Plan Note (Signed)
-  This problem is chronic and stable -Patient states that her symptoms are well controlled on her Wellbutrin XL -We will continue this medication for now -Her PHQ 9 score today was 10 which is improved from 18 on her prior visit in September -However, she was noted to have a PHQ 2 score of 0 on her last visit in January and this may be contributing to her fatigue.  I will consider changing her medication regimen if her fatigue persists and her work-up is within normal limits

## 2017-07-09 NOTE — Assessment & Plan Note (Signed)
-  This problem is chronic and stable -Patient states that she has a follow-up with her neurosurgeon next month -No further work-up at this time

## 2017-07-09 NOTE — Assessment & Plan Note (Signed)
-  This problem is chronic and stable -Patient states that her symptoms are well controlled on Prilosec 20 mg twice daily -We will continue this medication for now

## 2017-07-09 NOTE — Assessment & Plan Note (Signed)
-  This problem is chronic and stable -Patient states that she has not had any recurrent gout flares since being on the allopurinol -We will continue the allopurinol for now -I have refilled this medication for her today

## 2017-07-09 NOTE — Assessment & Plan Note (Signed)
-  Patient blood counts have been normal over the last several checks -However, she is complaining of worsening fatigue and has a history of iron deficiency anemia -We will recheck her CBC today

## 2017-07-09 NOTE — Patient Instructions (Signed)
-  It was a pleasure seeing you today -Please decrease your metoprolol to 1/2 tablet twice daily -I have refilled your amlodipine and Lasix -We will check some blood work on you today -Please call me with any questions -Have a great day!

## 2017-07-10 LAB — CBC WITH DIFFERENTIAL/PLATELET
Basophils Absolute: 0 10*3/uL (ref 0.0–0.2)
Basos: 0 %
EOS (ABSOLUTE): 0.3 10*3/uL (ref 0.0–0.4)
Eos: 5 %
Hematocrit: 35.4 % (ref 34.0–46.6)
Hemoglobin: 11.6 g/dL (ref 11.1–15.9)
Immature Grans (Abs): 0 10*3/uL (ref 0.0–0.1)
Immature Granulocytes: 0 %
Lymphocytes Absolute: 1.5 10*3/uL (ref 0.7–3.1)
Lymphs: 30 %
MCH: 31.4 pg (ref 26.6–33.0)
MCHC: 32.8 g/dL (ref 31.5–35.7)
MCV: 96 fL (ref 79–97)
Monocytes Absolute: 0.4 10*3/uL (ref 0.1–0.9)
Monocytes: 7 %
Neutrophils Absolute: 2.8 10*3/uL (ref 1.4–7.0)
Neutrophils: 58 %
Platelets: 214 10*3/uL (ref 150–450)
RBC: 3.7 x10E6/uL — ABNORMAL LOW (ref 3.77–5.28)
RDW: 15.3 % (ref 12.3–15.4)
WBC: 5 10*3/uL (ref 3.4–10.8)

## 2017-07-10 LAB — BMP8+ANION GAP
Anion Gap: 15 mmol/L (ref 10.0–18.0)
BUN/Creatinine Ratio: 15 (ref 12–28)
BUN: 37 mg/dL — ABNORMAL HIGH (ref 8–27)
CO2: 24 mmol/L (ref 20–29)
Calcium: 9.6 mg/dL (ref 8.7–10.3)
Chloride: 105 mmol/L (ref 96–106)
Creatinine, Ser: 2.39 mg/dL — ABNORMAL HIGH (ref 0.57–1.00)
GFR calc Af Amer: 21 mL/min/{1.73_m2} — ABNORMAL LOW (ref >59)
GFR calc non Af Amer: 18 mL/min/{1.73_m2} — ABNORMAL LOW (ref >59)
Glucose: 86 mg/dL (ref 65–99)
Potassium: 4.7 mmol/L (ref 3.5–5.2)
Sodium: 144 mmol/L (ref 134–144)

## 2017-07-10 LAB — TSH: TSH: 3.61 u[IU]/mL (ref 0.450–4.500)

## 2017-08-14 DIAGNOSIS — N760 Acute vaginitis: Secondary | ICD-10-CM | POA: Diagnosis not present

## 2017-08-14 DIAGNOSIS — N8111 Cystocele, midline: Secondary | ICD-10-CM | POA: Diagnosis not present

## 2017-08-21 ENCOUNTER — Emergency Department (HOSPITAL_COMMUNITY)
Admission: EM | Admit: 2017-08-21 | Discharge: 2017-08-21 | Disposition: A | Discharge status: Home / Self Care | Payer: Medicare Other | Attending: Emergency Medicine | Admitting: Emergency Medicine

## 2017-08-21 ENCOUNTER — Encounter (HOSPITAL_COMMUNITY): Payer: Self-pay | Admitting: *Deleted

## 2017-08-21 ENCOUNTER — Emergency Department (HOSPITAL_COMMUNITY): Payer: Medicare Other

## 2017-08-21 DIAGNOSIS — N184 Chronic kidney disease, stage 4 (severe): Secondary | ICD-10-CM | POA: Diagnosis not present

## 2017-08-21 DIAGNOSIS — I129 Hypertensive chronic kidney disease with stage 1 through stage 4 chronic kidney disease, or unspecified chronic kidney disease: Secondary | ICD-10-CM | POA: Diagnosis not present

## 2017-08-21 DIAGNOSIS — R079 Chest pain, unspecified: Secondary | ICD-10-CM | POA: Diagnosis not present

## 2017-08-21 DIAGNOSIS — R1013 Epigastric pain: Secondary | ICD-10-CM | POA: Insufficient documentation

## 2017-08-21 DIAGNOSIS — Z87891 Personal history of nicotine dependence: Secondary | ICD-10-CM | POA: Insufficient documentation

## 2017-08-21 DIAGNOSIS — K219 Gastro-esophageal reflux disease without esophagitis: Secondary | ICD-10-CM

## 2017-08-21 DIAGNOSIS — Z79899 Other long term (current) drug therapy: Secondary | ICD-10-CM | POA: Diagnosis not present

## 2017-08-21 DIAGNOSIS — R0789 Other chest pain: Secondary | ICD-10-CM

## 2017-08-21 DIAGNOSIS — Z8673 Personal history of transient ischemic attack (TIA), and cerebral infarction without residual deficits: Secondary | ICD-10-CM | POA: Insufficient documentation

## 2017-08-21 LAB — CBC
HCT: 40.7 % (ref 36.0–46.0)
Hemoglobin: 12.6 g/dL (ref 12.0–15.0)
MCH: 30.9 pg (ref 26.0–34.0)
MCHC: 31 g/dL (ref 30.0–36.0)
MCV: 99.8 fL (ref 78.0–100.0)
Platelets: 198 10*3/uL (ref 150–400)
RBC: 4.08 MIL/uL (ref 3.87–5.11)
RDW: 13.9 % (ref 11.5–15.5)
WBC: 4.9 10*3/uL (ref 4.0–10.5)

## 2017-08-21 LAB — I-STAT TROPONIN, ED: Troponin i, poc: 0.03 ng/mL (ref 0.00–0.08)

## 2017-08-21 LAB — HEPATIC FUNCTION PANEL
ALT: 20 U/L (ref 0–44)
AST: 25 U/L (ref 15–41)
Albumin: 4 g/dL (ref 3.5–5.0)
Alkaline Phosphatase: 144 U/L — ABNORMAL HIGH (ref 38–126)
Bilirubin, Direct: <0.1 mg/dL (ref 0.0–0.2)
Total Bilirubin: 0.5 mg/dL (ref 0.3–1.2)
Total Protein: 7.2 g/dL (ref 6.5–8.1)

## 2017-08-21 LAB — BASIC METABOLIC PANEL
Anion gap: 10 (ref 5–15)
BUN: 38 mg/dL — ABNORMAL HIGH (ref 8–23)
CO2: 25 mmol/L (ref 22–32)
Calcium: 9.6 mg/dL (ref 8.9–10.3)
Chloride: 105 mmol/L (ref 98–111)
Creatinine, Ser: 2.74 mg/dL — ABNORMAL HIGH (ref 0.44–1.00)
GFR calc Af Amer: 18 mL/min — ABNORMAL LOW (ref >60)
GFR calc non Af Amer: 15 mL/min — ABNORMAL LOW (ref >60)
Glucose, Bld: 101 mg/dL — ABNORMAL HIGH (ref 70–99)
Potassium: 4.6 mmol/L (ref 3.5–5.1)
Sodium: 140 mmol/L (ref 135–145)

## 2017-08-21 LAB — LIPASE, BLOOD: Lipase: 61 U/L — ABNORMAL HIGH (ref 11–51)

## 2017-08-21 MED ORDER — FAMOTIDINE 20 MG PO TABS: 20.0000 mg | ORAL_TABLET | Freq: Two times a day (BID) | ORAL | 0 refills | Status: DC

## 2017-08-21 MED ORDER — FAMOTIDINE 20 MG PO TABS
20.0000 mg | ORAL_TABLET | Freq: Once | ORAL | Status: AC
  Administered 2017-08-21: 20 mg via ORAL
  Filled 2017-08-21: qty 1

## 2017-08-21 MED ORDER — GI COCKTAIL ~~LOC~~
30.0000 mL | Freq: Once | ORAL | Status: AC
  Administered 2017-08-21: 30 mL via ORAL
  Filled 2017-08-21: qty 30

## 2017-08-21 MED ORDER — SUCRALFATE 1 GM/10ML PO SUSP: 1.0000 g | Freq: Three times a day (TID) | ORAL | 1 refills | Status: DC

## 2017-08-21 MED ORDER — PANTOPRAZOLE SODIUM 20 MG PO TBEC
20.0000 mg | DELAYED_RELEASE_TABLET | Freq: Once | ORAL | Status: AC
  Administered 2017-08-21: 20 mg via ORAL
  Filled 2017-08-21 (×2): qty 1

## 2017-08-21 NOTE — ED Triage Notes (Signed)
Pt in c/o chest pain described as tightness that started this morning, denies shortness of breath or n/v, no distress noted

## 2017-08-21 NOTE — ED Notes (Signed)
Pt stable, ambulatory, states understanding of discharge instructions, son called for pickup

## 2017-08-21 NOTE — Discharge Instructions (Signed)
Continue your on omeprazole as prescribed. Take Carafate 4 times daily as prescribed. Take Pepcid twice daily for 2 weeks. Follow dietary instructions for reflux. See your doctor for recheck and referral to Gastroenterology for an upper endoscopy of your stomach. Return to the ER if symptoms worsening or changing.

## 2017-08-21 NOTE — ED Provider Notes (Signed)
[facility] EMERGENCY DEPARTMENT Provider Note   CSN: [id number] Arrival date & time: 08/21/17  4098     History   Chief Complaint Chief Complaint  Patient presents with  . Chest Pain    HPI [name] is a 83 y.o. female.  HPI Patient reports has been having problems with epigastric discomfort and pain up into the center of her chest.  Triage note indicates that this was morning.  However, with further discussion, patient indicates that this is been a problem off and on for at least 10 years.  She reports that she gets a pain and she indicates her epigastrium and reports that it moves up and stops either in the center of her chest or the base of her throat.  She does not have associated shortness of breath, vomiting, diaphoresis or lightheadedness.  She reports sometimes she will take a over-the-counter course of antacid and then once the symptoms are improved she discontinues it.  He denies any food makes it better or worse.  She reports that can come on completely at random.  She was admitted to the hospital 5\2019 for atypical chest pain, rule out MI. Past Medical History:  Diagnosis Date  . Anemia due to blood loss, chronic   . Anemia, iron deficiency   . Angiodysplasia of colon 05/09/2005   Single small angiodysplastic lesion in the descending colon found on colonoscopy 05/09/2005 by [name]  . Arthritis    "knots on my thumbs; left elbow" (03/03/2014)  . Cataracts, bilateral   . Chronic ankle pain    bilateral  . Chronic kidney disease, stage IV (severe) ([place])    [name] 02/09/2014  . Chronic knee pain    bilateral  . Chronic thumb pain   . Depression   . Epicondylitis   . Gastritis, Helicobacter pylori    Noted on EGD 01/18/2009 by [name];  biopsy showed chronic gastritis with Helicobacter pylori, treated by [name].  [name] GERD (gastroesophageal reflux disease)   . Gout   . Hemorrhoids, internal 05/09/2005    Found on colonoscopy  05/09/2005 by [name]  . History of blood transfusion    "low blood count"  . Hypertension   . Osteoporosis, unspecified 03/06/2012   A DXA bone density scan done 02/20/2012 showed osteoporosis with a lumbar spine T-score of -4.4 and a right femur T-score of -2.8.  Patient has chronic kidney disease with estimated GFR less than 30, and it is not clear how much of her osteoporosis may be due to renal osteodystrophy.    . Pericardial effusion 11/2004   S/P subxiphoid pericardial window 11/01/2004 by [name].  A question of possible collagen vascular disease had been raised in 2006 due to arthralgias, a history of idiopathic pericarditis, and increased pigmentation of her palms, and she was referred to [name] but he was unable to confirm a rheumatologic diagnosis.  . Pinguecula   . Renal failure    Formerly on hemodialysis; managed by Dr. Corliss Parish  . Stiffness of joint, not elsewhere classified, hand   . Stroke [facility]) 2007   "when my kidneys went out & I was in a coma; weak LUE/LLE since"  . Thigh pain   . Vitreous detachment    "no OR"    Patient Active Problem List   Diagnosis Date Noted  . Chest pain, non-cardiac 05/30/2017  . Benign positional vertigo 09/21/2016  . Preventative health care 09/11/2016  . Vaginal  mass 07/19/2016  . History of vertigo 04/10/2016  . Insomnia 05/03/2015  . GERD (gastroesophageal reflux disease) 05/03/2015  . Fatigue 04/20/2015  . Meningioma ([place]) 03/01/2015  . History of CVA (cerebrovascular accident) 01/24/2015  . Gout of left elbow 03/03/2014  . Hearing loss 11/03/2013  . Osteoporosis 03/06/2012  . Depression 08/30/2009  . Iron deficiency anemia 06/01/2009  . Disease of pericardium 06/01/2009  . CKD (chronic kidney disease) stage 4, GFR 15-29 ml/min ([facility]) 06/01/2009  . CATARACTS 10/24/2005  . Essential hypertension 10/24/2005  . ANGIODYSPLASIA, COLON 05/09/2005    Past Surgical History:  Procedure  Laterality Date  . ABDOMINAL HYSTERECTOMY  1960's  . ARTERIOVENOUS GRAFT PLACEMENT Left 11/28/2005   forearm arteriovenous  . CARDIAC CATHETERIZATION  01/2005   [name] 05/15/2010  . HALLUX VALGUS CORRECTION Left 01/2012   foot  . SUBXYPHOID PERICARDIAL WINDOW  11/01/2004  . THROMBECTOMY Left  03/06/2006   Simple thrombectomy arteriovenous Gore-Tex graft, left arm, with exploration of venous end and intraoperative shuntogram.         . THROMBECTOMY / ARTERIOVENOUS GRAFT REVISION Left 01/21/2006   Thrombectomy and revision of left forearm loop AV graft.  . TUBAL LIGATION  1960's   "before hysterectomy"     OB History   None      Home Medications    Prior to Admission medications   Medication Sig Start Date End Date Taking? Authorizing Provider  acetaminophen (TYLENOL) 650 MG CR tablet Take 650 mg by mouth every 8 (eight) hours as needed for pain.   Yes [provider]  allopurinol (ZYLOPRIM) 100 MG tablet TAKE 1 & 1/2 (ONE & ONE-HALF) TABLETS BY MOUTH ONCE DAILY Patient taking differently: Take 150 mg by mouth daily.  06/12/17  Yes [name], MD  amLODipine (NORVASC) 10 MG tablet Take 1 tablet (10 mg total) by mouth at bedtime. 07/09/17  Yes [name], MD  buPROPion (WELLBUTRIN XL) 150 MG 24 hr tablet Take 1 tablet (150 mg total) by mouth daily. 01/08/17  Yes [name], MD  diphenhydrAMINE (BENADRYL) 25 MG tablet Take 25 mg by mouth as needed for itching or allergies.   Yes [provider]  docusate sodium (COLACE) 100 MG capsule Take 100 mg by mouth daily.   Yes [provider]  furosemide (LASIX) 80 MG tablet Take 0.5 tablets (40 mg total) by mouth daily. 07/09/17  Yes [name], MD  meclizine (ANTIVERT) 12.5 MG tablet TAKE 1 TABLET BY MOUTH TWICE DAILY AS NEEDED FOR DIZZINESS 01/30/17  Yes [name], MD  metoprolol tartrate (LOPRESSOR) 25 MG tablet Take 0.5 tablets (12.5 mg total) by mouth 2 (two) times daily. Patient  taking differently: Take 25 mg by mouth 2 (two) times daily.  07/09/17  Yes [name], MD  metroNIDAZOLE (FLAGYL) 500 MG tablet Take 500 mg by mouth 2 (two) times daily.   Yes [provider]  Multiple Vitamins-Minerals (MULTIVITAMIN WITH MINERALS) tablet Take 1 tablet by mouth daily. Centrum Silver   Yes [provider]  omeprazole (PRILOSEC) 20 MG capsule Take 1 capsule (20 mg total) by mouth 2 (two) times daily. 01/08/17  Yes [name], MD  famotidine (PEPCID) 20 MG tablet Take 1 tablet (20 mg total) by mouth 2 (two) times daily. 08/21/17   Charlesetta Shanks, MD  SENSIPAR 30 MG tablet TAKE 1 TABLET BY MOUTH ONCE DAILY Patient not taking: Reported on 10/24/2016 10/02/16   [name], MD  sucralfate (CARAFATE) 1 GM/10ML suspension Take 10 mLs (1 g  total) by mouth 4 (four) times daily -  with meals and at bedtime. 08/21/17   Charlesetta Shanks, MD  FLUoxetine (PROZAC) 10 MG tablet Take 1 tablet (10 mg total) by mouth daily. 03/07/11 03/12/11  [name], MD    Family History Family History  Problem Relation Age of Onset  . Hypertension Brother   . Heart disease Father   . Diabetes Brother   . Breast cancer Neg Hx   . Colon cancer Neg Hx   . Lung cancer Neg Hx   . Cervical cancer Neg Hx   . Sickle cell anemia Neg Hx     Social History Social History   Tobacco Use  . Smoking status: Former Smoker    Packs/day: 0.10    Years: 1.00    Pack years: 0.10    Types: Cigarettes    Last attempt to quit: 01/02/1960    Years since quitting: 57.6  . Smokeless tobacco: Never Used  . Tobacco comment: patient stated has never smoked   Substance Use Topics  . Alcohol use: No    Alcohol/week: 0.0 standard drinks  . Drug use: No     Allergies   Ibuprofen   Review of Systems Review of Systems 10 Systems reviewed and are negative for acute change except as noted in the HPI.  Physical Exam Updated Vital Signs BP 132/85   Pulse 69   Temp 98 F (36.7 C)  (Oral)   Resp (!) 27   SpO2 98%   Physical Exam  Constitutional: She is oriented to person, place, and time. She appears well-developed and well-nourished.  HENT:  Head: Normocephalic and atraumatic.  Eyes: EOM are normal.  Neck: Neck supple.  Cardiovascular: Normal rate, regular rhythm, normal heart sounds and intact distal pulses.  Pulmonary/Chest: Effort normal and breath sounds normal.  Abdominal: Soft. Bowel sounds are normal. She exhibits no distension. There is tenderness.  Mild epigastric discomfort to palpation.  No guarding.  Musculoskeletal: Normal range of motion. She exhibits no edema or tenderness.  No peripheral edema.  Calves are soft nontender.  Skin condition excellent.  Neurological: She is alert and oriented to person, place, and time. She has normal strength. Coordination normal. GCS eye subscore is 4. GCS verbal subscore is 5. GCS motor subscore is 6.  Skin: Skin is warm, dry and intact.  Psychiatric: She has a normal mood and affect.     ED Treatments / Results  Labs (all labs ordered are listed, but only abnormal results are displayed) Labs Reviewed  BASIC METABOLIC PANEL - Abnormal; Notable for the following components:      Result Value   Glucose, Bld 101 (*)    BUN 38 (*)    Creatinine, Ser 2.74 (*)    GFR calc non Af Amer 15 (*)    GFR calc Af Amer 18 (*)    All other components within normal limits  LIPASE, BLOOD - Abnormal; Notable for the following components:   Lipase 61 (*)    All other components within normal limits  HEPATIC FUNCTION PANEL - Abnormal; Notable for the following components:   Alkaline Phosphatase 144 (*)    All other components within normal limits  CBC  I-STAT TROPONIN, ED    EKG EKG Interpretation  Date/Time:  Wednesday August 21 2017 08:19:59 EDT Ventricular Rate:  80 PR Interval:  158 QRS Duration: 86 QT Interval:  378 QTC Calculation: 435 R Axis:   55 Text Interpretation:  Normal sinus rhythm Normal  ECG no  change from previous Confirmed by Charlesetta Shanks [phone]) on 08/21/2017 8:37:38 AM   Radiology Dg Chest 2 View  Result Date: 08/21/2017 CLINICAL DATA:  Chest pain. EXAM: CHEST - 2 VIEW COMPARISON:  Radiograph of May 30, 2017. FINDINGS: Stable cardiomegaly. No pneumothorax or pleural effusion is noted. Both lungs are clear. The visualized skeletal structures are unremarkable. IMPRESSION: No active cardiopulmonary disease. Electronically Signed   By: Marijo Conception, M.D.   On: 08/21/2017 09:06    Procedures Procedures (including critical care time)  Medications Ordered in ED Medications  pantoprazole (PROTONIX) EC tablet 20 mg (20 mg Oral Given 08/21/17 1127)  gi cocktail (Maalox,Lidocaine,Donnatal) (30 mLs Oral Given 08/21/17 1127)  famotidine (PEPCID) tablet 20 mg (20 mg Oral Given 08/21/17 1127)     Initial Impression / Assessment and Plan / ED Course  I have reviewed the triage vital signs and the nursing notes.  Pertinent labs & imaging results that were available during my care of the patient were reviewed by me and considered in my medical decision making (see chart for details).     Final Clinical Impressions(s) / ED Diagnoses   Final diagnoses:  Atypical chest pain  Gastroesophageal reflux disease, esophagitis presence not specified   Patient's chest pain is atypical for ischemic disease.  Experiences lower chest and epigastric discomfort that makes its way either halfway up her chest or towards the base of her throat.  She does not have associated shortness of breath, diaphoresis, nausea.  She has had admission in May for cardiac rule out for chest pain.  She does have documented history of hiatal hernia.  Do suspect reflux disease or hiatal hernia more so than cardiac ischemia.  Plan at this time will be to have patient continue her omeprazole as prescribed and add Pepcid and Carafate for 2 weeks.  Dietary instructions are provided in discharge instructions.  Is counseled to  follow-up with her PCP for evaluation for referral to GI for probable upper endoscopy.  Patient believes she had an upper endoscopy many years ago but no apparent more recent to correlate with her symptoms. ED Discharge Orders         Ordered    famotidine (PEPCID) 20 MG tablet  2 times daily     08/21/17 1241    sucralfate (CARAFATE) 1 GM/10ML suspension  3 times daily with meals & bedtime     08/21/17 1241           Charlesetta Shanks, MD 08/21/17 1309

## 2017-09-04 ENCOUNTER — Other Ambulatory Visit: Payer: Self-pay | Admitting: Internal Medicine

## 2017-09-04 DIAGNOSIS — N184 Chronic kidney disease, stage 4 (severe): Secondary | ICD-10-CM | POA: Diagnosis not present

## 2017-09-04 DIAGNOSIS — K219 Gastro-esophageal reflux disease without esophagitis: Secondary | ICD-10-CM

## 2017-09-04 DIAGNOSIS — I129 Hypertensive chronic kidney disease with stage 1 through stage 4 chronic kidney disease, or unspecified chronic kidney disease: Secondary | ICD-10-CM | POA: Diagnosis not present

## 2017-09-04 DIAGNOSIS — F32A Depression, unspecified: Secondary | ICD-10-CM

## 2017-09-04 DIAGNOSIS — F329 Major depressive disorder, single episode, unspecified: Secondary | ICD-10-CM

## 2017-09-04 DIAGNOSIS — N8110 Cystocele, unspecified: Secondary | ICD-10-CM | POA: Diagnosis not present

## 2017-09-04 DIAGNOSIS — M109 Gout, unspecified: Secondary | ICD-10-CM | POA: Diagnosis not present

## 2017-09-04 DIAGNOSIS — D631 Anemia in chronic kidney disease: Secondary | ICD-10-CM | POA: Diagnosis not present

## 2017-09-17 DIAGNOSIS — Z961 Presence of intraocular lens: Secondary | ICD-10-CM | POA: Diagnosis not present

## 2017-09-19 DIAGNOSIS — N760 Acute vaginitis: Secondary | ICD-10-CM | POA: Diagnosis not present

## 2017-09-19 DIAGNOSIS — N8111 Cystocele, midline: Secondary | ICD-10-CM | POA: Diagnosis not present

## 2017-10-16 ENCOUNTER — Encounter: Payer: Self-pay | Admitting: Internal Medicine

## 2017-10-16 ENCOUNTER — Emergency Department (HOSPITAL_COMMUNITY)
Admission: EM | Admit: 2017-10-16 | Discharge: 2017-10-16 | Disposition: A | Discharge status: Home / Self Care | Payer: Medicare Other | Attending: Emergency Medicine | Admitting: Emergency Medicine

## 2017-10-16 ENCOUNTER — Telehealth: Payer: Self-pay | Admitting: Internal Medicine

## 2017-10-16 ENCOUNTER — Ambulatory Visit (HOSPITAL_COMMUNITY)
Admission: RE | Admit: 2017-10-16 | Discharge: 2017-10-16 | Disposition: A | Discharge status: Home / Self Care | Payer: Medicare Other | Source: Ambulatory Visit | Attending: Internal Medicine | Admitting: Internal Medicine

## 2017-10-16 ENCOUNTER — Other Ambulatory Visit: Payer: Self-pay

## 2017-10-16 ENCOUNTER — Encounter (HOSPITAL_COMMUNITY): Payer: Self-pay

## 2017-10-16 ENCOUNTER — Ambulatory Visit (INDEPENDENT_AMBULATORY_CARE_PROVIDER_SITE_OTHER): Payer: Medicare Other | Admitting: Internal Medicine

## 2017-10-16 ENCOUNTER — Other Ambulatory Visit: Payer: Self-pay | Admitting: Internal Medicine

## 2017-10-16 VITALS — BP 152/79 | HR 81 | Temp 98.0°F | Ht 61.0 in | Wt 162.2 lb

## 2017-10-16 DIAGNOSIS — R5383 Other fatigue: Secondary | ICD-10-CM

## 2017-10-16 DIAGNOSIS — R05 Cough: Secondary | ICD-10-CM | POA: Diagnosis not present

## 2017-10-16 DIAGNOSIS — E162 Hypoglycemia, unspecified: Secondary | ICD-10-CM | POA: Insufficient documentation

## 2017-10-16 DIAGNOSIS — R42 Dizziness and giddiness: Secondary | ICD-10-CM | POA: Diagnosis not present

## 2017-10-16 DIAGNOSIS — Z79899 Other long term (current) drug therapy: Secondary | ICD-10-CM | POA: Diagnosis not present

## 2017-10-16 DIAGNOSIS — I129 Hypertensive chronic kidney disease with stage 1 through stage 4 chronic kidney disease, or unspecified chronic kidney disease: Secondary | ICD-10-CM | POA: Diagnosis not present

## 2017-10-16 DIAGNOSIS — Z87891 Personal history of nicotine dependence: Secondary | ICD-10-CM | POA: Diagnosis not present

## 2017-10-16 DIAGNOSIS — N184 Chronic kidney disease, stage 4 (severe): Secondary | ICD-10-CM | POA: Insufficient documentation

## 2017-10-16 DIAGNOSIS — Z9889 Other specified postprocedural states: Secondary | ICD-10-CM | POA: Diagnosis not present

## 2017-10-16 LAB — CBC WITH DIFFERENTIAL/PLATELET
Abs Immature Granulocytes: 0.01 10*3/uL (ref 0.00–0.07)
Abs Immature Granulocytes: 0.01 10*3/uL (ref 0.00–0.07)
Basophils Absolute: 0 10*3/uL (ref 0.0–0.1)
Basophils Absolute: 0 10*3/uL (ref 0.0–0.1)
Basophils Relative: 1 %
Basophils Relative: 0 %
Eosinophils Absolute: 0.3 10*3/uL (ref 0.0–0.5)
Eosinophils Absolute: 0.3 10*3/uL (ref 0.0–0.5)
Eosinophils Relative: 7 %
Eosinophils Relative: 7 %
HCT: 38.3 % (ref 36.0–46.0)
HCT: 38.5 % (ref 36.0–46.0)
Hemoglobin: 11.9 g/dL — ABNORMAL LOW (ref 12.0–15.0)
Hemoglobin: 12.2 g/dL (ref 12.0–15.0)
Immature Granulocytes: 0 %
Immature Granulocytes: 0 %
Lymphocytes Relative: 29 %
Lymphocytes Relative: 37 %
Lymphs Abs: 1.2 10*3/uL (ref 0.7–4.0)
Lymphs Abs: 1.9 10*3/uL (ref 0.7–4.0)
MCH: 31 pg (ref 26.0–34.0)
MCH: 31.8 pg (ref 26.0–34.0)
MCHC: 31.1 g/dL (ref 30.0–36.0)
MCHC: 31.7 g/dL (ref 30.0–36.0)
MCV: 99.7 fL (ref 80.0–100.0)
MCV: 100.3 fL — ABNORMAL HIGH (ref 80.0–100.0)
Monocytes Absolute: 0.4 10*3/uL (ref 0.1–1.0)
Monocytes Absolute: 0.6 10*3/uL (ref 0.1–1.0)
Monocytes Relative: 10 %
Monocytes Relative: 11 %
Neutro Abs: 2.3 10*3/uL (ref 1.7–7.7)
Neutro Abs: 2.3 10*3/uL (ref 1.7–7.7)
Neutrophils Relative %: 53 %
Neutrophils Relative %: 45 %
Platelets: 201 10*3/uL (ref 150–400)
Platelets: 209 10*3/uL (ref 150–400)
RBC: 3.84 MIL/uL — ABNORMAL LOW (ref 3.87–5.11)
RBC: 3.84 MIL/uL — ABNORMAL LOW (ref 3.87–5.11)
RDW: 14.1 % (ref 11.5–15.5)
RDW: 14.3 % (ref 11.5–15.5)
WBC: 4.3 10*3/uL (ref 4.0–10.5)
WBC: 5.2 10*3/uL (ref 4.0–10.5)
nRBC: 0 % (ref 0.0–0.2)
nRBC: 0 % (ref 0.0–0.2)

## 2017-10-16 LAB — COMPREHENSIVE METABOLIC PANEL
ALT: 17 U/L (ref 0–44)
ALT: 17 U/L (ref 0–44)
AST: 21 U/L (ref 15–41)
AST: 20 U/L (ref 15–41)
Albumin: 3.8 g/dL (ref 3.5–5.0)
Albumin: 3.8 g/dL (ref 3.5–5.0)
Alkaline Phosphatase: 119 U/L (ref 38–126)
Alkaline Phosphatase: 124 U/L (ref 38–126)
Anion gap: 8 (ref 5–15)
Anion gap: 7 (ref 5–15)
BUN: 45 mg/dL — ABNORMAL HIGH (ref 8–23)
BUN: 39 mg/dL — ABNORMAL HIGH (ref 8–23)
CO2: 27 mmol/L (ref 22–32)
CO2: 28 mmol/L (ref 22–32)
Calcium: 10.1 mg/dL (ref 8.9–10.3)
Calcium: 10.3 mg/dL (ref 8.9–10.3)
Chloride: 104 mmol/L (ref 98–111)
Chloride: 103 mmol/L (ref 98–111)
Creatinine, Ser: 2.58 mg/dL — ABNORMAL HIGH (ref 0.44–1.00)
Creatinine, Ser: 2.33 mg/dL — ABNORMAL HIGH (ref 0.44–1.00)
GFR calc Af Amer: 19 mL/min — ABNORMAL LOW (ref >60)
GFR calc Af Amer: 21 mL/min — ABNORMAL LOW (ref >60)
GFR calc non Af Amer: 16 mL/min — ABNORMAL LOW (ref >60)
GFR calc non Af Amer: 18 mL/min — ABNORMAL LOW (ref >60)
Glucose, Bld: 85 mg/dL (ref 70–99)
Glucose, Bld: 101 mg/dL — ABNORMAL HIGH (ref 70–99)
Potassium: 4.9 mmol/L (ref 3.5–5.1)
Potassium: 5.4 mmol/L — ABNORMAL HIGH (ref 3.5–5.1)
Sodium: 139 mmol/L (ref 135–145)
Sodium: 138 mmol/L (ref 135–145)
Total Bilirubin: 0.5 mg/dL (ref 0.3–1.2)
Total Bilirubin: 0.6 mg/dL (ref 0.3–1.2)
Total Protein: 7.3 g/dL (ref 6.5–8.1)
Total Protein: 7.5 g/dL (ref 6.5–8.1)

## 2017-10-16 LAB — GLUCOSE, CAPILLARY: Glucose-Capillary: 68 mg/dL — ABNORMAL LOW (ref 70–99)

## 2017-10-16 LAB — TROPONIN I: Troponin I: 0.03 ng/mL (ref <0.03)

## 2017-10-16 LAB — POCT GLYCOSYLATED HEMOGLOBIN (HGB A1C): Hemoglobin A1C: 5.4 % (ref 4.0–5.6)

## 2017-10-16 LAB — CBG MONITORING, ED
Glucose-Capillary: 50 mg/dL — ABNORMAL LOW (ref 70–99)
Glucose-Capillary: 54 mg/dL — ABNORMAL LOW (ref 70–99)
Glucose-Capillary: 85 mg/dL (ref 70–99)
Glucose-Capillary: 116 mg/dL — ABNORMAL HIGH (ref 70–99)

## 2017-10-16 LAB — LIPASE, BLOOD: Lipase: 69 U/L — ABNORMAL HIGH (ref 11–51)

## 2017-10-16 MED ORDER — SODIUM CHLORIDE 0.9% FLUSH: 3.0000 mL | INTRAVENOUS | Status: DC | PRN

## 2017-10-16 NOTE — ED Notes (Signed)
Per lab troponin is 0.03, MD [place] aware.

## 2017-10-16 NOTE — ED Provider Notes (Signed)
[facility] Provider Note   CSN: [id number] Arrival date & time: 10/16/17  1828     History   Chief Complaint Chief Complaint  Patient presents with  . Hypoglycemia    HPI DEAISA MERIDA is a 83 y.o. female.  HPI  83 year old female presents with a chief complaint of fatigue.  The patient has been feeling this way for about 4 5 days.  She is been having decreased p.o. intake and decreased appetite.  She feels bloated when she does try to eat/drink.  However no vomiting or abdominal pain.  She has not actually looked distended to her.  No chest pain or shortness of breath.  No headache or focal weakness.  She is had a prior stroke with left-sided deficits but no new deficits.  Went to internal medicine teaching service clinic this morning where she had labs done and was told to come get rechecked because of a glucose in the 60s.  It was still low so she was sent here.  Past Medical History:  Diagnosis Date  . Anemia due to blood loss, chronic   . Anemia, iron deficiency   . Angiodysplasia of colon 05/09/2005   Single small angiodysplastic lesion in the descending colon found on colonoscopy 05/09/2005 by [name]  . Arthritis    "knots on my thumbs; left elbow" (03/03/2014)  . Cataracts, bilateral   . Chronic ankle pain    bilateral  . Chronic kidney disease, stage IV (severe) ([place])    [name] 02/09/2014  . Chronic knee pain    bilateral  . Chronic thumb pain   . Depression   . Epicondylitis   . Gastritis, Helicobacter pylori    Noted on EGD 01/18/2009 by [name];  biopsy showed chronic gastritis with Helicobacter pylori, treated by [name].  [name] GERD (gastroesophageal reflux disease)   . Gout   . Hemorrhoids, internal 05/09/2005    Found on colonoscopy 05/09/2005 by [name]  . History of blood transfusion    "low blood count"  . Hypertension   . Osteoporosis, unspecified 03/06/2012   A DXA bone density scan  done 02/20/2012 showed osteoporosis with a lumbar spine T-score of -4.4 and a right femur T-score of -2.8.  Patient has chronic kidney disease with estimated GFR less than 30, and it is not clear how much of her osteoporosis may be due to renal osteodystrophy.    . Pericardial effusion 11/2004   S/P subxiphoid pericardial window 11/01/2004 by [name].  A question of possible collagen vascular disease had been raised in 2006 due to arthralgias, a history of idiopathic pericarditis, and increased pigmentation of her palms, and she was referred to [name] but he was unable to confirm a rheumatologic diagnosis.  . Pinguecula   . Renal failure    Formerly on hemodialysis; managed by Dr. Corliss Parish  . Stiffness of joint, not elsewhere classified, hand   . Stroke [facility]) 2007   "when my kidneys went out & I was in a coma; weak LUE/LLE since"  . Thigh pain   . Vitreous detachment    "no OR"    Patient Active Problem List   Diagnosis Date Noted  . Chest pain, non-cardiac 05/30/2017  . Benign positional vertigo 09/21/2016  . Preventative health care 09/11/2016  . Vaginal mass 07/19/2016  . History of vertigo 04/10/2016  . Insomnia 05/03/2015  . GERD (gastroesophageal reflux disease) 05/03/2015  . Fatigue 04/20/2015  .  Meningioma ([place]) 03/01/2015  . History of CVA (cerebrovascular accident) 01/24/2015  . Gout of left elbow 03/03/2014  . Hearing loss 11/03/2013  . Osteoporosis 03/06/2012  . Depression 08/30/2009  . Iron deficiency anemia 06/01/2009  . Disease of pericardium 06/01/2009  . CKD (chronic kidney disease) stage 4, GFR 15-29 ml/min ([facility]) 06/01/2009  . CATARACTS 10/24/2005  . Essential hypertension 10/24/2005  . ANGIODYSPLASIA, COLON 05/09/2005    Past Surgical History:  Procedure Laterality Date  . ABDOMINAL HYSTERECTOMY  1960's  . ARTERIOVENOUS GRAFT PLACEMENT Left 11/28/2005   forearm arteriovenous  . CARDIAC CATHETERIZATION  01/2005   [name]  05/15/2010  . HALLUX VALGUS CORRECTION Left 01/2012   foot  . SUBXYPHOID PERICARDIAL WINDOW  11/01/2004  . THROMBECTOMY Left  03/06/2006   Simple thrombectomy arteriovenous Gore-Tex graft, left arm, with exploration of venous end and intraoperative shuntogram.         . THROMBECTOMY / ARTERIOVENOUS GRAFT REVISION Left 01/21/2006   Thrombectomy and revision of left forearm loop AV graft.  . TUBAL LIGATION  1960's   "before hysterectomy"     OB History   None      Home Medications    Prior to Admission medications   Medication Sig Start Date End Date Taking? Authorizing Provider  allopurinol (ZYLOPRIM) 100 MG tablet TAKE 1 & 1/2 (ONE & ONE-HALF) TABLETS BY MOUTH ONCE DAILY Patient taking differently: Take 150 mg by mouth daily.  06/12/17  Yes [name], MD  amLODipine (NORVASC) 10 MG tablet Take 1 tablet (10 mg total) by mouth at bedtime. 07/09/17  Yes [name], MD  buPROPion (WELLBUTRIN XL) 150 MG 24 hr tablet TAKE 1 TABLET BY MOUTH ONCE DAILY 09/04/17  Yes [name], MD  famotidine (PEPCID) 20 MG tablet Take 1 tablet (20 mg total) by mouth 2 (two) times daily. 08/21/17  Yes Charlesetta Shanks, MD  furosemide (LASIX) 80 MG tablet Take 0.5 tablets (40 mg total) by mouth daily. 07/09/17  Yes [name], MD  meclizine (ANTIVERT) 12.5 MG tablet TAKE 1 TABLET BY MOUTH TWICE DAILY AS NEEDED FOR DIZZINESS 01/30/17  Yes [name], MD  metoprolol tartrate (LOPRESSOR) 25 MG tablet Take 0.5 tablets (12.5 mg total) by mouth 2 (two) times daily. Patient taking differently: Take 25 mg by mouth 2 (two) times daily.  07/09/17  Yes [name], MD  Multiple Vitamins-Minerals (MULTIVITAMIN WITH MINERALS) tablet Take 1 tablet by mouth daily. Centrum Silver   Yes [provider]  omeprazole (PRILOSEC) 20 MG capsule TAKE 1 CAPSULE BY MOUTH TWICE DAILY 09/04/17  Yes [name], MD  sucralfate (CARAFATE) 1 GM/10ML suspension Take 10 mLs (1 g total) by mouth 4  (four) times daily -  with meals and at bedtime. 08/21/17  Yes Charlesetta Shanks, MD  acetaminophen (TYLENOL) 650 MG CR tablet Take 650 mg by mouth every 8 (eight) hours as needed for pain.    [provider]  diphenhydrAMINE (BENADRYL) 25 MG tablet Take 25 mg by mouth as needed for itching or allergies.    [provider]  docusate sodium (COLACE) 100 MG capsule Take 100 mg by mouth daily.    [provider]  metroNIDAZOLE (FLAGYL) 500 MG tablet Take 500 mg by mouth 2 (two) times daily.    [provider]  SENSIPAR 30 MG tablet TAKE 1 TABLET BY MOUTH ONCE DAILY Patient not taking: Reported on 10/24/2016 10/02/16   [name], MD  FLUoxetine (PROZAC) 10 MG tablet Take 1 tablet (10 mg total)  by mouth daily. 03/07/11 03/12/11  [name], MD    Family History Family History  Problem Relation Age of Onset  . Hypertension Brother   . Heart disease Father   . Diabetes Brother   . Breast cancer Neg Hx   . Colon cancer Neg Hx   . Lung cancer Neg Hx   . Cervical cancer Neg Hx   . Sickle cell anemia Neg Hx     Social History Social History   Tobacco Use  . Smoking status: Former Smoker    Packs/day: 0.10    Years: 1.00    Pack years: 0.10    Types: Cigarettes    Last attempt to quit: 01/02/1960    Years since quitting: 57.8  . Smokeless tobacco: Never Used  . Tobacco comment: patient stated has never smoked   Substance Use Topics  . Alcohol use: No    Alcohol/week: 0.0 standard drinks  . Drug use: No     Allergies   Ibuprofen   Review of Systems Review of Systems  Constitutional: Positive for fatigue. Negative for fever.  Respiratory: Negative for shortness of breath.   Cardiovascular: Negative for chest pain.  Gastrointestinal: Positive for nausea. Negative for abdominal distention, abdominal pain and vomiting.  Neurological: Positive for light-headedness. Negative for weakness, numbness and headaches.  All other systems reviewed and  are negative.    Physical Exam Updated Vital Signs BP (!) 144/77   Pulse 82   Temp 98 F (36.7 C) (Oral)   Resp 19   SpO2 98%   Physical Exam  Constitutional: She is oriented to person, place, and time. She appears well-developed and well-nourished. No distress.  HENT:  Head: Normocephalic and atraumatic.  Right Ear: External ear normal.  Left Ear: External ear normal.  Nose: Nose normal.  Eyes: Pupils are equal, round, and reactive to light. EOM are normal. Right eye exhibits no discharge. Left eye exhibits no discharge.  Cardiovascular: Normal rate, regular rhythm and normal heart sounds.  Pulmonary/Chest: Effort normal and breath sounds normal.  Abdominal: Soft. There is no tenderness.  Neurological: She is alert and oriented to person, place, and time.  CN 3-12 grossly intact. 5/5 strength RUE, RLE. 4/5 strength LUE, LLE. Grossly normal sensation.   Skin: Skin is warm and dry. She is not diaphoretic.  Nursing note and vitals reviewed.    ED Treatments / Results  Labs (all labs ordered are listed, but only abnormal results are displayed) Labs Reviewed  COMPREHENSIVE METABOLIC PANEL - Abnormal; Notable for the following components:      Result Value   Potassium 5.4 (*)    Glucose, Bld 101 (*)    BUN 39 (*)    Creatinine, Ser 2.33 (*)    GFR calc non Af Amer 18 (*)    GFR calc Af Amer 21 (*)    All other components within normal limits  LIPASE, BLOOD - Abnormal; Notable for the following components:   Lipase 69 (*)    All other components within normal limits  CBC WITH DIFFERENTIAL/PLATELET - Abnormal; Notable for the following components:   RBC 3.84 (*)    MCV 100.3 (*)    All other components within normal limits  TROPONIN I - Abnormal; Notable for the following components:   Troponin I 0.03 (*)    All other components within normal limits  CBG MONITORING, ED - Abnormal; Notable for the following components:   Glucose-Capillary 54 (*)    All other components  within normal limits  CBG MONITORING, ED - Abnormal; Notable for the following components:   Glucose-Capillary 50 (*)    All other components within normal limits  CBG MONITORING, ED - Abnormal; Notable for the following components:   Glucose-Capillary 116 (*)    All other components within normal limits  URINALYSIS, ROUTINE W REFLEX MICROSCOPIC  CBG MONITORING, ED  CBG MONITORING, ED    EKG EKG Interpretation  Date/Time:  Wednesday October 16 2017 20:00:34 EDT Ventricular Rate:  82 PR Interval:    QRS Duration: 84 QT Interval:  358 QTC Calculation: 419 R Axis:   52 Text Interpretation:  Sinus rhythm Borderline low voltage, extremity leads no significant change compared to Aug 2019 Confirmed by Sherwood Gambler [phone]) on 10/16/2017 8:03:11 PM   Radiology Dg Chest 2 View  Result Date: 10/16/2017 CLINICAL DATA:  Productive cough for 1 week.  Fatigue. EXAM: CHEST - 2 VIEW COMPARISON:  08/21/2017. FINDINGS: Cardiomegaly. Improved vascular congestion compared with priors. No consolidation or edema. No pneumothorax. Osteopenia. Advanced kyphosis. IMPRESSION: Cardiomegaly.  No active disease.  Improved aeration. Electronically Signed   By: Staci Righter M.D.   On: 10/16/2017 15:19    Procedures Procedures (including critical care time)  Medications Ordered in ED Medications  sodium chloride flush (NS) 0.9 % injection 3 mL (has no administration in time range)     Initial Impression / Assessment and Plan / ED Course  I have reviewed the triage vital signs and the nursing notes.  Pertinent labs & imaging results that were available during my care of the patient were reviewed by me and considered in my medical decision making (see chart for details).     Patient symptoms of dizziness/fatigue are likely related to the hypoglycemia.  This resolved once she had oral intake with sandwich, arches, peanut letter/crackers.  Given her age, ECG and troponin obtained.  ECG without obvious  acute ischemic changes.  Troponin is 0.03 which is consistent with her priors and likely related to the chronic kidney disease.  Given multiple days of symptoms and low suspicion for ACS, I do not think further testing is needed.  She was encouraged to eat more often as the decreased p.o. intake is almost undoubtedly causing the symptoms.  Follow-up with PCP.  Return precautions.  Final Clinical Impressions(s) / ED Diagnoses   Final diagnoses:  Hypoglycemia    ED Discharge Orders    None       Sherwood Gambler, MD 10/16/17 2200

## 2017-10-16 NOTE — ED Notes (Signed)
Spoke to lab about Troponin that has not resulted, advised that it is running and should result shortly.

## 2017-10-16 NOTE — Discharge Instructions (Addendum)
If you start having recurrent symptoms, be sure to eat/drink carbohydrates such as orange juice, candy, peanut butter, etc. Follow up with your primary care physician. Return to the ER or call 911 if you develop worsening or new/concerning symptoms.

## 2017-10-16 NOTE — ED Provider Notes (Signed)
Patient placed in Quick Look pathway, seen and evaluated   Chief Complaint: Hypoglycemia  HPI:   Patient reports that earlier this morning she was in internal medicine for generally not feeling well, feeling weak and dizzy.  She had labs drawn then.  They found that she was hypoglycemic and gave her orange juice.  She reports that she has eaten since then.  They reportedly called her and asked her to come back to get checked.  She reports she is feeling better now than she was this morning.  She states she is not diabetic.   ROS: Feels ok (one)  Physical Exam:   Gen: No distress  Neuro: Awake and Alert  Skin: Warm    Focused Exam: Alert, oriented, conversing normally.  Patient sugar  54.  She is given 8 oz apple juice.    Initiation of care has begun. The patient has been counseled on the process, plan, and necessity for staying for the completion/evaluation, and the remainder of the medical screening examination    Ollen Gross 10/16/17 [place], [place], DO 10/16/17 2255

## 2017-10-16 NOTE — Telephone Encounter (Signed)
Need refills on metoprolol tartrate (LOPRESSOR) 25 MG tablet at [place] ;pt contact [phone]

## 2017-10-16 NOTE — Telephone Encounter (Signed)
Metoprolol #90 with 3 refills sent to [facility] 07/09/2017. Call placed to pharmacy. States patient picked up 180 tabs metoprolol on 10/07/2017. Call placed to patient for clarification. No answer. Left message on VM requesting return call. Hubbard Hartshorn, RN, BSN

## 2017-10-16 NOTE — Progress Notes (Signed)
Hypoglycemic Event  CBG: 68  Treatment: Orange Juice   Symptoms: Tired , weak  Follow-up CBG: Time   CBG Result:  Possible Reasons for Event:Pa  Comments/MD notified Dr. Koleen Distance  Patient was taken orange juice in Xray.    [name], [employer]

## 2017-10-16 NOTE — ED Notes (Addendum)
Pt drinking orange juice, eating [place] sandwich and saltine crackers.

## 2017-10-16 NOTE — Progress Notes (Signed)
   CC: fatigue  HPI:  [name] is a 83 y.o. female who presents with one week of progressive fatigue. Also endorses lightheadedness with standing, dyspnea on exertion, and a mild productive cough with yellow sputum. She denies any recent sick contacts. No fevers, chills, headache, syncope, sore throat, chest pain, palpitations, PND, abdominal pain, diarrhea, melena, hemoptysis, or urinary symptoms.  Endorses similar symptoms several years ago when she was found to be acutely anemic requiring blood transfusion.   Past Medical History:  Diagnosis Date  . Anemia due to blood loss, chronic   . Anemia, iron deficiency   . Angiodysplasia of colon 05/09/2005   Single small angiodysplastic lesion in the descending colon found on colonoscopy 05/09/2005 by [name]  . Arthritis    "knots on my thumbs; left elbow" (03/03/2014)  . Cataracts, bilateral   . Chronic ankle pain    bilateral  . Chronic kidney disease, stage IV (severe) ([place])    [name] 02/09/2014  . Chronic knee pain    bilateral  . Chronic thumb pain   . Depression   . Epicondylitis   . Gastritis, Helicobacter pylori    Noted on EGD 01/18/2009 by [name];  biopsy showed chronic gastritis with Helicobacter pylori, treated by [name].  [name] GERD (gastroesophageal reflux disease)   . Gout   . Hemorrhoids, internal 05/09/2005    Found on colonoscopy 05/09/2005 by [name]  . History of blood transfusion    "low blood count"  . Hypertension   . Osteoporosis, unspecified 03/06/2012   A DXA bone density scan done 02/20/2012 showed osteoporosis with a lumbar spine T-score of -4.4 and a right femur T-score of -2.8.  Patient has chronic kidney disease with estimated GFR less than 30, and it is not clear how much of her osteoporosis may be due to renal osteodystrophy.    . Pericardial effusion 11/2004   S/P subxiphoid pericardial window 11/01/2004 by [name].  A question of possible collagen  vascular disease had been raised in 2006 due to arthralgias, a history of idiopathic pericarditis, and increased pigmentation of her palms, and she was referred to [name] but he was unable to confirm a rheumatologic diagnosis.  . Pinguecula   . Renal failure    Formerly on hemodialysis; managed by Dr. Corliss Parish  . Stiffness of joint, not elsewhere classified, hand   . Stroke [facility]) 2007   "when my kidneys went out & I was in a coma; weak LUE/LLE since"  . Thigh pain   . Vitreous detachment    "no OR"   Review of Systems:  Per HPI  Physical Exam:  Vitals:   10/16/17 0847  BP: (!) 152/79  Pulse: 81  Temp: 98 F (36.7 C)  TempSrc: Oral  SpO2: 98%  Weight: 162 lb 3.2 oz (73.6 kg)  Height: 5\' 1"  (1.549 m)   General: alert, tired appearing, NAD HEENT: conjunctiva without pallor, posterior oropharynx with mild erythema, no exudate Neck: mild submandibular LAD CV: RRR; no murmurs, rubs or gallops; normal capillary refill; distal pulses equal and intact bilaterally  Lungs: normal respiratory effort; lungs CTA bilaterally without crackles or wheezes Ext: no pitting edema Psych: appropriate mood and affect   Assessment & Plan:   See Encounters Tab for problem based charting.  Patient seen with Dr. Rebeca Alert

## 2017-10-16 NOTE — Assessment & Plan Note (Addendum)
Initial concern for acute anemia versus worsening renal function. CBC revealed stable hemoglobin. CMP with stable renal function and electrolytes. CXR ordered to rule out pneumonia given productive cough which was unremarkable.  Patient's CBG was 68. She was given orange juice while in radiology for CXR, but did not have repeat done after. Hypoglycemia is concerning, given she has no has no history of diabetes. Unfortunately, she was unable to return to clinic for repeat CBG so was instructed to go to ED for further evaluation given dangers of complications due to hypoglycemia. If she is persistently hypoglycemic, would need to undergo further work-up including insulin, C-peptide, beta-hydroxybutyrate, proinsulin. Differential includes adrenal insufficiency, insulinoma, insulin autoimmune hypoglycemia, functional beta cell disorder.

## 2017-10-16 NOTE — Telephone Encounter (Signed)
Called patient to inform her that electrolytes, hgb and kidney function are normal. However, her blood glucose was 68 (she was given an orange juice prior to leaving radiology and did not notice a difference in symptoms). Told her she needed to come back into the clinic to recheck CBG. If she is not able to do so prior to clinic closing, she will go to the ED. Patient will call back when she knows if she can get a ride to clinic during office hours today.

## 2017-10-16 NOTE — ED Notes (Signed)
Discharge instructions discussed with Pt. Pt verbalized understanding. Pt stable and ambulatory.    

## 2017-10-16 NOTE — Patient Instructions (Signed)
[name], I am sorry you are not feeling well. I am checking some blood work to see if we can find a cause. I have also ordered a chest x-ray to see if there is any sign of infection in your lungs.   I will call and let you know these results and if we need you to come back for treatment.   Otherwise, I'd like to see you back in a couple of weeks to make sure you are feeling better.   Please call if you have any questions or concerns in the mean time!

## 2017-10-16 NOTE — ED Triage Notes (Signed)
Pt endorses being called by internal medicine clinic to come back to ER this afternoon to have CBG checked. CBG 54. Seen by Internal medicine this morning for weakness and dizziness. Feels better now. States she has been drinking orange juice today and has eaten. Given 8oz of juice in triage. Axox4.

## 2017-10-16 NOTE — ED Notes (Signed)
Pt ambulatory to bathroom with no reported issues. 

## 2017-10-18 ENCOUNTER — Other Ambulatory Visit: Payer: Medicare Other

## 2017-10-18 LAB — GLUCOSE, CAPILLARY: Glucose-Capillary: 90 mg/dL (ref 70–99)

## 2017-10-22 NOTE — Progress Notes (Signed)
Internal Medicine Clinic Attending  I saw and evaluated the patient.  I personally confirmed the key portions of the history and exam documented by Dr. Koleen Distance and I reviewed pertinent patient test results.  The assessment, diagnosis, and plan were formulated together and I agree with the documentation in the resident's note.  Appeared very fatigued. Not anemic, but found to be hypoglycemic, given juice and sent home. However, new onset symptomatic hypoglycemia of unknown etiology needs further evaluation and likely admission. Not diabetic. Called to come back to clinic, but unable to return until later, was evaluated in the ED, where her glucose was still low, no other labs sent to evaluate, improved with food and drink.  Still requires further evaluation, fortunately, on follow up had normalized glucose. If any additional episodes, would recommend complete diagnostic evaluation and possible admission.   Lenice Pressman, M.D., Ph.D.

## 2017-10-30 ENCOUNTER — Ambulatory Visit: Payer: Medicare Other

## 2017-12-10 DIAGNOSIS — R300 Dysuria: Secondary | ICD-10-CM | POA: Diagnosis not present

## 2017-12-10 DIAGNOSIS — N760 Acute vaginitis: Secondary | ICD-10-CM | POA: Diagnosis not present

## 2017-12-23 ENCOUNTER — Other Ambulatory Visit: Payer: Self-pay | Admitting: Internal Medicine

## 2018-01-14 ENCOUNTER — Encounter: Payer: Self-pay | Admitting: Licensed Clinical Social Worker

## 2018-01-14 ENCOUNTER — Encounter: Payer: Self-pay | Admitting: Internal Medicine

## 2018-01-14 ENCOUNTER — Ambulatory Visit: Payer: Medicare Other | Admitting: Licensed Clinical Social Worker

## 2018-01-14 ENCOUNTER — Other Ambulatory Visit: Payer: Self-pay

## 2018-01-14 ENCOUNTER — Ambulatory Visit (INDEPENDENT_AMBULATORY_CARE_PROVIDER_SITE_OTHER): Payer: Medicare Other | Admitting: Internal Medicine

## 2018-01-14 ENCOUNTER — Telehealth: Payer: Self-pay | Admitting: *Deleted

## 2018-01-14 VITALS — BP 140/83 | HR 71 | Temp 97.7°F | Ht 61.0 in | Wt 160.5 lb

## 2018-01-14 DIAGNOSIS — Z2821 Immunization not carried out because of patient refusal: Secondary | ICD-10-CM

## 2018-01-14 DIAGNOSIS — Z0000 Encounter for general adult medical examination without abnormal findings: Secondary | ICD-10-CM

## 2018-01-14 DIAGNOSIS — D329 Benign neoplasm of meninges, unspecified: Secondary | ICD-10-CM

## 2018-01-14 DIAGNOSIS — N184 Chronic kidney disease, stage 4 (severe): Secondary | ICD-10-CM | POA: Diagnosis not present

## 2018-01-14 DIAGNOSIS — M109 Gout, unspecified: Secondary | ICD-10-CM

## 2018-01-14 DIAGNOSIS — H8110 Benign paroxysmal vertigo, unspecified ear: Secondary | ICD-10-CM | POA: Diagnosis not present

## 2018-01-14 DIAGNOSIS — F339 Major depressive disorder, recurrent, unspecified: Secondary | ICD-10-CM

## 2018-01-14 DIAGNOSIS — F329 Major depressive disorder, single episode, unspecified: Secondary | ICD-10-CM

## 2018-01-14 DIAGNOSIS — F4323 Adjustment disorder with mixed anxiety and depressed mood: Secondary | ICD-10-CM

## 2018-01-14 DIAGNOSIS — I129 Hypertensive chronic kidney disease with stage 1 through stage 4 chronic kidney disease, or unspecified chronic kidney disease: Secondary | ICD-10-CM

## 2018-01-14 DIAGNOSIS — F32A Depression, unspecified: Secondary | ICD-10-CM

## 2018-01-14 DIAGNOSIS — K219 Gastro-esophageal reflux disease without esophagitis: Secondary | ICD-10-CM

## 2018-01-14 DIAGNOSIS — M1A30X0 Chronic gout due to renal impairment, unspecified site, without tophus (tophi): Secondary | ICD-10-CM

## 2018-01-14 DIAGNOSIS — I10 Essential (primary) hypertension: Secondary | ICD-10-CM

## 2018-01-14 DIAGNOSIS — Z79899 Other long term (current) drug therapy: Secondary | ICD-10-CM

## 2018-01-14 MED ORDER — FAMOTIDINE 20 MG PO TABS: 20.0000 mg | ORAL_TABLET | Freq: Two times a day (BID) | ORAL | 1 refills | Status: DC

## 2018-01-14 MED ORDER — ALLOPURINOL 100 MG PO TABS: 150.0000 mg | ORAL_TABLET | Freq: Every day | ORAL | 1 refills | Status: DC

## 2018-01-14 MED ORDER — BUPROPION HCL ER (XL) 300 MG PO TB24: 300.0000 mg | ORAL_TABLET | Freq: Every day | ORAL | 0 refills | Status: DC

## 2018-01-14 MED ORDER — BUPROPION HCL ER (XL) 150 MG PO TB24: 150.0000 mg | ORAL_TABLET | Freq: Every day | ORAL | 1 refills | Status: DC

## 2018-01-14 NOTE — Assessment & Plan Note (Signed)
-  Patient refuses pneumonia and flu vaccines -No further work-up at this time

## 2018-01-14 NOTE — Assessment & Plan Note (Signed)
-  This problem is chronic and stable -Patient follows up with the neurosurgeon yearly for this -She will schedule an appointment with the neurosurgeon for follow-up -No further work-up at this time

## 2018-01-14 NOTE — BH Specialist Note (Signed)
Integrated Behavioral Health Initial Visit  MRN: [id number] Name: [name]  Number of [place] Clinician visits:: 1/6 Session Start time: 9:00  Session End time: 9:37 Total time: 37 minutes  Type of Service: [place] Interpretor:No.    Warm Hand Off Completed.       SUBJECTIVE: [name] is a 84 y.o. female whom attended the session individually. Patient was referred by Dr. Dareen Piano for depressive symptoms and elevated PHQ (Score=16).  Patient reports the following symptoms/concerns: Feeling tired everyday, sleep difficulties, decreased appetite, poor concentration, interpersonal stress, anxiety   Duration of problem: Approximately one year; Severity of problem: moderate  OBJECTIVE: Mood: Depressed and Affect: Tearful Risk of harm to self or others: No plan to harm self or others  LIFE CONTEXT: Family and Social: Patient lives with her two sons and the wife of one of her sons. While she has lived with one of her sons for a long period of time with little to no emotional or interpersonal concerns, one of her other sons and his wife moved in with the patient approximately one year ago when he was unemployed. She reported that although he has become employed, he and his wife are not actively seeking to move out and have only recently started to assist with paying bills. She also reported communication difficulties with her son's wife. She reported that her feelings of anxiety and depression stem from these family and home dynamics. Patient has attempted to share her feelings and concerns to her son in the past, but she reported that her son is not receptive to her shared concerns.  School/Work: Patient is currently not working.  Self-Care: Patient engages in yoga and watching TV.  Life Changes: Patient's feelings of depression and anxiety have increased since her son and his wife moved in about a year ago. She reported  sleep difficulties since this home transition and averages 3-4 hours of sleep per night. She often wakes up within an hour of falling asleep and has difficulty going back to sleep due to anxious thoughts. She usually watches TV as a way to fall back asleep and reported that she has not tried reading due to poor concentration. She plans to visit her ex-husband's family in [place] soon to help her relax.   GOALS ADDRESSED: Patient will: 1. Reduce symptoms of: anxiety, depression and stress 2. Increase knowledge and/or ability of: coping skills and stress reduction  3. Demonstrate ability to: Increase healthy adjustment to current life circumstances, Increase adequate support systems for patient/family and increase healthy communication with her son  INTERVENTIONS: Interventions utilized: Motivational Interviewing and Supportive Counseling and brief CBT. Therapist used supportive counseling to actively listen, reflect, and validate patient's feelings of depression, anxiety, and interpersonal concerns. Therapist used MI to explore patient's ambivalence around communicating with her son and highlighted the directions in which the patient may choose to move towards (i.e., accept the current circumstances or make changes that are in line with her goals). Therapist used brief CBT to explore thoughts of cognitive dissonance related to her more recent periods of avoidance versus a history of hostile responses. Therapist assisted patient to identify the middle-ground between avoidance and extreme responding to assist her in communicating her feelings with natural supports.  Standardized Assessments completed: Assessed for SI, HI, and self harm   ASSESSMENT: Patient currently experiencing symptoms of depression and anxiety related to stress in family and home dynamics. Specifically, she reported frustration related to her son and his  wife's long-term stay with her in her home and lack of efforts to seek independent  housing. She reported that she feels unable to express her feelings to her son, as she does not want to "bring anger into the home" and communicate in a hostile way similar to her past experiences. She also reported frustration related to communication difficulties with her sons' wife. Patient reported sleep difficulties and taking Benadryl to help fall asleep, feeling fatigued every day, thoughts of anxiety related to her interpersonal concerns, decreased appetite, and poor concentration.   Patient may benefit from increasing healthy communication, such as sharing her feelings of frustration, depression, and anxiety to her son and a potential timeline for her son and his wife to move out. Patient may benefit from increasing her social supports and practicing healthy coping skills.   PLAN: 1. Follow up with behavioral health clinician on : two weeks.   Dessie Coma, LPC, LCAS

## 2018-01-14 NOTE — Patient Instructions (Signed)
-  It was a pleasure seeing you today -I will increase your Wellbutrin to 300 mg daily -We will check some blood work on you today -I will refer you to behavioral health for your worsening depression -Please follow-up with Dr. Moshe Cipro for your kidneys -Please follow-up with your neurologist for the meningioma -Please call me if you have any questions -Have a great new year!

## 2018-01-14 NOTE — Assessment & Plan Note (Signed)
-  Patient has worsening PHQ 9 score (16) and complains of worsening depression -She states that her appetite has been poor and she is also not been sleeping well -She does take an Ensure when she skips a meal but states that she is to have a much better appetite -Her son and daughter-in-law moved into the house with her and she feels like she has no space -We will increase her Wellbutrin to 300 mg daily -I have referred her to our clinical behavioral specialist Miquel Dunn) who will meet with her today -We will continue to monitor her closely

## 2018-01-14 NOTE — Assessment & Plan Note (Signed)
-  This problem is chronic and stable -Patient will follow-up with Dr. Moshe Cipro later this year -We will check BMP today -No further work-up at this time

## 2018-01-14 NOTE — Assessment & Plan Note (Signed)
-  This problem is chronic and stable -Patient states that she needs to use her meclizine only intermittently and has not used this in a while -No further work-up at this time

## 2018-01-14 NOTE — Progress Notes (Signed)
   Subjective:    Patient ID: [name], female    DOB: 04-13-1934, 84 y.o.   MRN: [id number]  HPI  I have seen and examined this patient.  Patient is here for routine follow-up of her hypertension and CKD.  Patient states that her appetite is decreased and she has been having difficulty sleeping at night.  She states that her depression is worsening and she feels that she has no space in her house.  She denies any other complaints at this time and states that she is compliant with her medication.  Review of Systems  Constitutional: Positive for appetite change and fatigue. Negative for unexpected weight change.       Complains of decreased appetite  HENT: Negative.   Respiratory: Negative.   Cardiovascular: Negative.   Gastrointestinal: Negative.   Musculoskeletal: Negative.   Neurological: Negative.   Psychiatric/Behavioral:       Complains of worsening depression       Objective:   Physical Exam Constitutional:      Appearance: Normal appearance.  HENT:     Head: Normocephalic and atraumatic.     Mouth/Throat:     Pharynx: Oropharynx is clear. No oropharyngeal exudate.  Neck:     Musculoskeletal: Neck supple.  Cardiovascular:     Rate and Rhythm: Normal rate and regular rhythm.     Heart sounds: Normal heart sounds. No murmur.  Pulmonary:     Effort: Pulmonary effort is normal.     Breath sounds: Normal breath sounds.  Abdominal:     General: Bowel sounds are normal. There is no distension.     Palpations: Abdomen is soft.     Tenderness: There is no abdominal tenderness.  Musculoskeletal: Normal range of motion.        General: No swelling.  Lymphadenopathy:     Cervical: No cervical adenopathy.  Neurological:     General: No focal deficit present.     Mental Status: She is alert and oriented to person, place, and time. Mental status is at baseline.  Psychiatric:        Behavior: Behavior normal.     Comments: Complains of worsening depression            Assessment & Plan:  Please see problem discharging for assessment and plan:

## 2018-01-14 NOTE — Telephone Encounter (Signed)
RTC to patient.  Patient asked if she needed to continue taking her Allopurinol.  Informed patient that she is taking the medication to prevent Gout.  Patient stated that she thought that was the reason.  Stated that she was unable to get her Allopurinol from [facility] as they stated that she had already picked up the medication.  Patient was informed when she went to pick up the prescription for Allopurinol written today that she had 1 filled on 01/03/2018 and would have to pay for the prescription if she gets it today.  Patient stated that she did not get the Allopurinol from [facility] when she called to get the Allopurinol.  Stated that she was never called for a pick up or did she get it herself.  Call to [facility] spoke to a Pharmacist there who said that patient/someone picked up the medication according to their records on 01/03/2018 at 3:02 PM.  Pharmacist said that he will call patient to discuss the pickup problem.  Pharmacist also said that the supervisor Threasa Beards was not there so that further investigations could be started to see who picked up the medication.  Sander Nephew, RN 01/14/2018 2:56 PM.

## 2018-01-14 NOTE — Assessment & Plan Note (Signed)
-  This problem is chronic and stable -Patient symptoms are well controlled on famotidine -We will refill this medication for her today

## 2018-01-14 NOTE — Assessment & Plan Note (Signed)
BP Readings from Last 3 Encounters:  01/14/18 140/83  10/16/17 (!) 144/77  10/16/17 (!) 152/79    Lab Results  Component Value Date   NA 138 10/16/2017   K 5.4 (H) 10/16/2017   CREATININE 2.33 (H) 10/16/2017    Assessment: Blood pressure control:  Well-controlled Progress toward BP goal:   At goal Comments: Patient is compliant with Lasix 40 mg daily, metoprolol 12.5 mg twice daily and amlodipine 10 mg  Plan: Medications:  continue current medications Educational resources provided:   Self management tools provided:   Other plans: We will check BMP today

## 2018-01-14 NOTE — Assessment & Plan Note (Signed)
-  This problem is chronic and stable -Patient has not had any recent gout exacerbations -We will refill her allopurinol 150 mg once daily today -No further work-up at this time

## 2018-01-15 ENCOUNTER — Telehealth: Payer: Self-pay | Admitting: Internal Medicine

## 2018-01-15 LAB — BMP8+ANION GAP
Anion Gap: 18 mmol/L (ref 10.0–18.0)
BUN/Creatinine Ratio: 13 (ref 12–28)
BUN: 32 mg/dL — ABNORMAL HIGH (ref 8–27)
CO2: 23 mmol/L (ref 20–29)
Calcium: 10.1 mg/dL (ref 8.7–10.3)
Chloride: 102 mmol/L (ref 96–106)
Creatinine, Ser: 2.49 mg/dL — ABNORMAL HIGH (ref 0.57–1.00)
GFR calc Af Amer: 20 mL/min/{1.73_m2} — ABNORMAL LOW (ref >59)
GFR calc non Af Amer: 18 mL/min/{1.73_m2} — ABNORMAL LOW (ref >59)
Glucose: 73 mg/dL (ref 65–99)
Potassium: 4.7 mmol/L (ref 3.5–5.2)
Sodium: 143 mmol/L (ref 134–144)

## 2018-01-15 LAB — TSH: TSH: 4.37 u[IU]/mL (ref 0.450–4.500)

## 2018-01-15 NOTE — Telephone Encounter (Signed)
I called the patient to discuss results of her blood work with her.  Patient's creatinine is at her baseline and her electrolytes are within normal limits.  Her TSH is within normal limits as well.  No further work-up at this time.  Patient to follow-up with nephrology as an outpatient.  Patient expresses understanding and is in agreement with plan.

## 2018-01-22 ENCOUNTER — Ambulatory Visit (INDEPENDENT_AMBULATORY_CARE_PROVIDER_SITE_OTHER): Payer: Medicare Other | Admitting: Internal Medicine

## 2018-01-22 ENCOUNTER — Other Ambulatory Visit: Payer: Self-pay

## 2018-01-22 VITALS — BP 137/81 | HR 69 | Temp 98.1°F | Wt 162.2 lb

## 2018-01-22 DIAGNOSIS — G4700 Insomnia, unspecified: Secondary | ICD-10-CM | POA: Diagnosis not present

## 2018-01-22 DIAGNOSIS — H269 Unspecified cataract: Secondary | ICD-10-CM | POA: Diagnosis not present

## 2018-01-22 DIAGNOSIS — H9313 Tinnitus, bilateral: Secondary | ICD-10-CM

## 2018-01-22 DIAGNOSIS — H539 Unspecified visual disturbance: Secondary | ICD-10-CM | POA: Diagnosis not present

## 2018-01-22 DIAGNOSIS — Z79899 Other long term (current) drug therapy: Secondary | ICD-10-CM

## 2018-01-22 MED ORDER — ZOLPIDEM TARTRATE 5 MG PO TABS: 5.0000 mg | ORAL_TABLET | Freq: Every evening | ORAL | 0 refills | Status: DC | PRN

## 2018-01-22 NOTE — Patient Instructions (Signed)
Thank you for allowing [place] to provide your care today.  You came in due to hearing noise and seeing yellow spots.  Your physical exam is reassuring, and is not suggestive of stroke or psychiatric issues.  I will send you for audiometric to check your hearing.   I please stop taking Benadryl as it can cause similar symptoms.  I prescribed another sleeping pill for you to be taken at night as needed.     Please continue rest of  your medications as before.  Please follow-up in clinic as needed. If no improvement in a week, please feel freed to call us.  Should you have any questions or concerns please call the internal medicine clinic at [phone].  ccdc Thank you

## 2018-01-22 NOTE — Progress Notes (Signed)
CC: Hearing extra sounds in ears  HPI:  [name] is a 84 y.o. female with PMHx listed below, presented with hearing extra sounds in her ears also complaining of seeing yellow spots (transient) on her visual field.  Refer to problem based charting for further details and assessment and plan.  Past Medical History:  Diagnosis Date  . Anemia due to blood loss, chronic   . Anemia, iron deficiency   . Angiodysplasia of colon 05/09/2005   Single small angiodysplastic lesion in the descending colon found on colonoscopy 05/09/2005 by [name]  . Arthritis    "knots on my thumbs; left elbow" (03/03/2014)  . Cataracts, bilateral   . Chronic ankle pain    bilateral  . Chronic kidney disease, stage IV (severe) ([place])    [name] 02/09/2014  . Chronic knee pain    bilateral  . Chronic thumb pain   . Depression   . Epicondylitis   . Gastritis, Helicobacter pylori    Noted on EGD 01/18/2009 by [name];  biopsy showed chronic gastritis with Helicobacter pylori, treated by [name].  [name] GERD (gastroesophageal reflux disease)   . Gout   . Hemorrhoids, internal 05/09/2005    Found on colonoscopy 05/09/2005 by [name]  . History of blood transfusion    "low blood count"  . Hypertension   . Osteoporosis, unspecified 03/06/2012   A DXA bone density scan done 02/20/2012 showed osteoporosis with a lumbar spine T-score of -4.4 and a right femur T-score of -2.8.  Patient has chronic kidney disease with estimated GFR less than 30, and it is not clear how much of her osteoporosis may be due to renal osteodystrophy.    . Pericardial effusion 11/2004   S/P subxiphoid pericardial window 11/01/2004 by [name].  A question of possible collagen vascular disease had been raised in 2006 due to arthralgias, a history of idiopathic pericarditis, and increased pigmentation of her palms, and she was referred to [name] but he was unable to confirm a rheumatologic  diagnosis.  . Pinguecula   . Renal failure    Formerly on hemodialysis; managed by Dr. Corliss Parish  . Stiffness of joint, not elsewhere classified, hand   . Stroke [facility]) 2007   "when my kidneys went out & I was in a coma; weak LUE/LLE since"  . Thigh pain   . Vitreous detachment    "no OR"   Review of Systems: Review of Systems  Constitutional: Negative for chills and fever.  HENT: Positive for hearing loss and tinnitus. Negative for ear pain.   Eyes: Negative for blurred vision, double vision and photophobia.       Yellow spots in visual field (transient and resolved)  Respiratory: Positive for cough. Negative for shortness of breath.   Cardiovascular: Negative for chest pain and palpitations.  Genitourinary: Negative for dysuria, frequency and hematuria.  Skin: Negative for rash.  Neurological: Negative for dizziness, sensory change and speech change.     Physical Exam:  Vitals:   01/22/18 1324  BP: 137/81  Pulse: 69  Temp: 98.1 F (36.7 C)  TempSrc: Oral  SpO2: 97%  Weight: 162 lb 3.2 oz (73.6 kg)   Physical Exam Constitutional:      Appearance: Normal appearance.  HENT:     Head: Normocephalic and atraumatic.     Right Ear: Tympanic membrane normal.     Ears:     Comments: Left TM was not visible due to  extra wax  Eyes:     Extraocular Movements: Extraocular movements intact.     Pupils: Pupils are equal, round, and reactive to light.  Cardiovascular:     Rate and Rhythm: Normal rate and regular rhythm.     Pulses: Normal pulses.     Heart sounds: Normal heart sounds. No murmur.  Pulmonary:     Effort: Pulmonary effort is normal.     Breath sounds: Normal breath sounds. No wheezing or rales.  Abdominal:     General: Bowel sounds are normal.     Palpations: Abdomen is soft.     Tenderness: There is no abdominal tenderness.  Musculoskeletal:     Right lower leg: No edema.     Left lower leg: No edema.  Skin:    General: Skin is warm and dry.    Neurological:     General: No focal deficit present.     Mental Status: She is alert and oriented to person, place, and time. Mental status is at baseline.     Sensory: No sensory deficit.  Psychiatric:        Mood and Affect: Mood normal.        Behavior: Behavior normal.        Thought Content: Thought content normal.        Judgment: Judgment normal.    Assessment & Plan:   See Encounters Tab for problem based charting.  Patient seen with Dr. Eppie Gibson

## 2018-01-24 DIAGNOSIS — H9313 Tinnitus, bilateral: Secondary | ICD-10-CM | POA: Insufficient documentation

## 2018-01-24 HISTORY — DX: Tinnitus, bilateral: H93.13

## 2018-01-24 NOTE — Assessment & Plan Note (Signed)
Stopping Benadryl due to possible anticholinergic effect, tinnitus.   -Stop Benadryl -Zolpidem 5 mg PRN at bed time

## 2018-01-24 NOTE — Assessment & Plan Note (Addendum)
Patient presented with episode of "hearing some noise in her both ears that sounded like some one was snoring next to her."   Also saw some yellow spot on her visual field that both resolved spontaneously after 1.5 h. During today encounter, she reported hearing buzzing on both ears again.  No vertigo or imbalance. No weakness or numbness.  On exam:     Right tympanic membrane is normal.     Left TM was not visible due to extra wax     Fondoscopic exam: retina and optic nerve exam were unremarkable (as much as possible to assess through un-dilated pupils)    Neurologic exam was at baseline  Assessment: Subjective tinnitus. Her symptoms, can also be due to anticholinergic effect of Benadryl (she takes 2 x Benadryl every night). No other symptoms or abnl exam to suggest new stroke or effect of previously diagnosed stable sphenoid meningioma. (Is not anatomically close to the optic chiasm) In addition, her symptoms do not seem to be due to auditory and visual hallucination as had nl psych exam and has full insight about her symptoms.   (Retinal detachment and intracranial abnormality were initially on differential for visual symptoms.  However not clinically fit. Patient has history of stable sphenoid meningioma, but less likely to be the origin of her symptoms, as she did not have any neurologic symptoms, her symptoms resolved spontaneously, and the location of meningioma is far from optic chiasm.  Plan:  -Stop Benadryl -Zolpidem 5 mg as needed for sleep -Audiometry (high frequency for subjective tinnitus) -Will follow up with ENT -return to clinic if having more visual abnormality.

## 2018-01-27 NOTE — Progress Notes (Signed)
I saw and evaluated the patient.  I personally confirmed the key portions of Dr. Darcey Nora history and exam and reviewed pertinent patient test results.  The assessment, diagnosis, and plan were formulated together and I agree with the documentation in the resident's note.  I confirmed the history and it is my impression she is experiencing subjective tinnitus.  She reportedly has a history of hearing loss and these symptoms would be consistent.  Her right TM had a sharp light reflex.  Her right TM could not be visualized.  Fundoscopically her right retina was w/o obvious abnormalities,  I had difficulty visualizing the left retina secondary to a cataract.  Psychiatrically she was intact and recounted her story of previous dialysis in vivid detail, thus I do not believe these symptoms are related to any psychiatric or dementing disorder.  I am not sure what to make of the transient visualized yellow dots.  I am concerned the anticholinergic effects of the benadryl could cause problems in the future, thus we gave her a short therapeutic trial of zolpidim 5 mg QHS PRN.

## 2018-02-03 DIAGNOSIS — H6123 Impacted cerumen, bilateral: Secondary | ICD-10-CM | POA: Diagnosis not present

## 2018-02-05 ENCOUNTER — Other Ambulatory Visit: Payer: Self-pay

## 2018-02-05 ENCOUNTER — Other Ambulatory Visit: Payer: Self-pay | Admitting: *Deleted

## 2018-02-05 DIAGNOSIS — K219 Gastro-esophageal reflux disease without esophagitis: Secondary | ICD-10-CM

## 2018-02-05 DIAGNOSIS — N184 Chronic kidney disease, stage 4 (severe): Secondary | ICD-10-CM

## 2018-02-05 MED ORDER — FUROSEMIDE 80 MG PO TABS: 40.0000 mg | ORAL_TABLET | Freq: Every day | ORAL | 2 refills | Status: DC

## 2018-02-05 MED ORDER — OMEPRAZOLE 20 MG PO CPDR: 20.0000 mg | DELAYED_RELEASE_CAPSULE | Freq: Two times a day (BID) | ORAL | 1 refills | Status: DC

## 2018-02-05 NOTE — Telephone Encounter (Signed)
Spoke w/ pt, changed her pharm for her to [facility], request 2 refills

## 2018-02-05 NOTE — Telephone Encounter (Signed)
Requesting to speak with a nurse about meds. Please call pt back.  

## 2018-02-10 DIAGNOSIS — H26493 Other secondary cataract, bilateral: Secondary | ICD-10-CM | POA: Diagnosis not present

## 2018-02-12 DIAGNOSIS — H903 Sensorineural hearing loss, bilateral: Secondary | ICD-10-CM | POA: Diagnosis not present

## 2018-02-20 DIAGNOSIS — N952 Postmenopausal atrophic vaginitis: Secondary | ICD-10-CM | POA: Diagnosis not present

## 2018-02-20 DIAGNOSIS — N8111 Cystocele, midline: Secondary | ICD-10-CM | POA: Diagnosis not present

## 2018-02-26 DIAGNOSIS — H26491 Other secondary cataract, right eye: Secondary | ICD-10-CM | POA: Diagnosis not present

## 2018-03-05 DIAGNOSIS — H26492 Other secondary cataract, left eye: Secondary | ICD-10-CM | POA: Diagnosis not present

## 2018-03-05 DIAGNOSIS — H2640 Unspecified secondary cataract: Secondary | ICD-10-CM | POA: Diagnosis not present

## 2018-04-03 ENCOUNTER — Other Ambulatory Visit: Payer: Self-pay | Admitting: Internal Medicine

## 2018-04-03 DIAGNOSIS — F329 Major depressive disorder, single episode, unspecified: Secondary | ICD-10-CM

## 2018-04-03 DIAGNOSIS — F32A Depression, unspecified: Secondary | ICD-10-CM

## 2018-05-07 ENCOUNTER — Other Ambulatory Visit: Payer: Self-pay

## 2018-05-07 DIAGNOSIS — Z87898 Personal history of other specified conditions: Secondary | ICD-10-CM

## 2018-05-07 MED ORDER — MECLIZINE HCL 12.5 MG PO TABS: ORAL_TABLET | ORAL | 0 refills | Status: DC

## 2018-05-07 NOTE — Telephone Encounter (Signed)
meclizine (ANTIVERT) 12.5 MG tablet, refill request @  [place], [place] AT [address] [phone] (Phone) [phone] (Fax)

## 2018-05-16 ENCOUNTER — Emergency Department (HOSPITAL_COMMUNITY)
Admission: EM | Admit: 2018-05-16 | Discharge: 2018-05-16 | Disposition: A | Discharge status: Home / Self Care | Payer: Medicare Other | Attending: Emergency Medicine | Admitting: Emergency Medicine

## 2018-05-16 ENCOUNTER — Encounter (HOSPITAL_COMMUNITY): Payer: Self-pay | Admitting: Emergency Medicine

## 2018-05-16 ENCOUNTER — Emergency Department (HOSPITAL_COMMUNITY): Payer: Medicare Other

## 2018-05-16 ENCOUNTER — Other Ambulatory Visit: Payer: Self-pay

## 2018-05-16 DIAGNOSIS — I129 Hypertensive chronic kidney disease with stage 1 through stage 4 chronic kidney disease, or unspecified chronic kidney disease: Secondary | ICD-10-CM | POA: Insufficient documentation

## 2018-05-16 DIAGNOSIS — K5792 Diverticulitis of intestine, part unspecified, without perforation or abscess without bleeding: Secondary | ICD-10-CM | POA: Insufficient documentation

## 2018-05-16 DIAGNOSIS — N184 Chronic kidney disease, stage 4 (severe): Secondary | ICD-10-CM | POA: Insufficient documentation

## 2018-05-16 DIAGNOSIS — K2900 Acute gastritis without bleeding: Secondary | ICD-10-CM | POA: Diagnosis not present

## 2018-05-16 DIAGNOSIS — I10 Essential (primary) hypertension: Secondary | ICD-10-CM | POA: Diagnosis not present

## 2018-05-16 DIAGNOSIS — K5732 Diverticulitis of large intestine without perforation or abscess without bleeding: Secondary | ICD-10-CM | POA: Diagnosis not present

## 2018-05-16 DIAGNOSIS — Z79899 Other long term (current) drug therapy: Secondary | ICD-10-CM | POA: Diagnosis not present

## 2018-05-16 DIAGNOSIS — R52 Pain, unspecified: Secondary | ICD-10-CM | POA: Diagnosis not present

## 2018-05-16 DIAGNOSIS — N200 Calculus of kidney: Secondary | ICD-10-CM | POA: Diagnosis not present

## 2018-05-16 DIAGNOSIS — R0602 Shortness of breath: Secondary | ICD-10-CM | POA: Insufficient documentation

## 2018-05-16 DIAGNOSIS — K2970 Gastritis, unspecified, without bleeding: Secondary | ICD-10-CM | POA: Diagnosis not present

## 2018-05-16 DIAGNOSIS — R1013 Epigastric pain: Secondary | ICD-10-CM | POA: Diagnosis present

## 2018-05-16 LAB — COMPREHENSIVE METABOLIC PANEL
ALT: 22 U/L (ref 0–44)
AST: 27 U/L (ref 15–41)
Albumin: 3.8 g/dL (ref 3.5–5.0)
Alkaline Phosphatase: 114 U/L (ref 38–126)
Anion gap: 11 (ref 5–15)
BUN: 38 mg/dL — ABNORMAL HIGH (ref 8–23)
CO2: 24 mmol/L (ref 22–32)
Calcium: 9.1 mg/dL (ref 8.9–10.3)
Chloride: 104 mmol/L (ref 98–111)
Creatinine, Ser: 2.32 mg/dL — ABNORMAL HIGH (ref 0.44–1.00)
GFR calc Af Amer: 22 mL/min — ABNORMAL LOW (ref >60)
GFR calc non Af Amer: 19 mL/min — ABNORMAL LOW (ref >60)
Glucose, Bld: 108 mg/dL — ABNORMAL HIGH (ref 70–99)
Potassium: 4.2 mmol/L (ref 3.5–5.1)
Sodium: 139 mmol/L (ref 135–145)
Total Bilirubin: 0.6 mg/dL (ref 0.3–1.2)
Total Protein: 7 g/dL (ref 6.5–8.1)

## 2018-05-16 LAB — CBC WITH DIFFERENTIAL/PLATELET
Abs Immature Granulocytes: 0.01 10*3/uL (ref 0.00–0.07)
Basophils Absolute: 0 10*3/uL (ref 0.0–0.1)
Basophils Relative: 0 %
Eosinophils Absolute: 0.2 10*3/uL (ref 0.0–0.5)
Eosinophils Relative: 4 %
HCT: 38.5 % (ref 36.0–46.0)
Hemoglobin: 12.3 g/dL (ref 12.0–15.0)
Immature Granulocytes: 0 %
Lymphocytes Relative: 32 %
Lymphs Abs: 1.5 10*3/uL (ref 0.7–4.0)
MCH: 30.8 pg (ref 26.0–34.0)
MCHC: 31.9 g/dL (ref 30.0–36.0)
MCV: 96.5 fL (ref 80.0–100.0)
Monocytes Absolute: 0.5 10*3/uL (ref 0.1–1.0)
Monocytes Relative: 10 %
Neutro Abs: 2.6 10*3/uL (ref 1.7–7.7)
Neutrophils Relative %: 54 %
Platelets: 186 10*3/uL (ref 150–400)
RBC: 3.99 MIL/uL (ref 3.87–5.11)
RDW: 14 % (ref 11.5–15.5)
WBC: 4.9 10*3/uL (ref 4.0–10.5)
nRBC: 0 % (ref 0.0–0.2)

## 2018-05-16 LAB — TROPONIN I
Troponin I: 0.03 ng/mL (ref <0.03)
Troponin I: 0.03 ng/mL (ref <0.03)

## 2018-05-16 LAB — LIPASE, BLOOD: Lipase: 57 U/L — ABNORMAL HIGH (ref 11–51)

## 2018-05-16 LAB — BRAIN NATRIURETIC PEPTIDE: B Natriuretic Peptide: 267.6 pg/mL — ABNORMAL HIGH (ref 0.0–100.0)

## 2018-05-16 MED ORDER — PANTOPRAZOLE SODIUM 40 MG IV SOLR
40.0000 mg | Freq: Once | INTRAVENOUS | Status: AC
  Administered 2018-05-16: 40 mg via INTRAVENOUS
  Filled 2018-05-16: qty 40

## 2018-05-16 MED ORDER — ALUM & MAG HYDROXIDE-SIMETH 200-200-20 MG/5ML PO SUSP
30.0000 mL | Freq: Once | ORAL | Status: AC
  Administered 2018-05-16: 16:00:00 30 mL via ORAL
  Filled 2018-05-16: qty 30

## 2018-05-16 MED ORDER — AMOXICILLIN-POT CLAVULANATE 875-125 MG PO TABS: 1.0000 | ORAL_TABLET | Freq: Two times a day (BID) | ORAL | 0 refills | Status: AC

## 2018-05-16 MED ORDER — LIDOCAINE VISCOUS HCL 2 % MT SOLN
15.0000 mL | Freq: Once | OROMUCOSAL | Status: AC
  Administered 2018-05-16: 15 mL via ORAL
  Filled 2018-05-16: qty 15

## 2018-05-16 NOTE — ED Provider Notes (Signed)
  Physical Exam  BP (!) 144/91   Pulse 88   Temp 98.7 F (37.1 C)   Resp (!) 21   SpO2 99%   Physical Exam  ED Course/Procedures    Procedures  MDM  Patient with intermittent abdominal pain. Currently asymptomatic.   Plan: CT abdomen, CXR. Will need a delta trop as her first one was 0.03 however low suspicion for ACS.   CT scan shows uncomplicated acute diverticulitis. Patient started on augmentin and discharged home with instructions to follow up with PCP. Patient in agreement with plan. All questions answered. Return precautions given.      Doneta Public, MD 05/16/18 1815    Jola Schmidt, MD 05/16/18 2044

## 2018-05-16 NOTE — ED Notes (Signed)
Pt ambulated to the bathroom and back. O2 stats dropped to 92%.

## 2018-05-16 NOTE — ED Triage Notes (Signed)
Pt arrives EMS with epigastric pain since 1800 yesterday. Took some meds to relieve it and it felt better.   Pt denies pain now.     152/68 93 18 rr 98.1 temp

## 2018-05-16 NOTE — ED Notes (Signed)
Date and time results received: 05/16/18 1505 (use smartphrase ".now" to insert current time)  Test: TROP Critical Value: 0.03  Name of Provider Notified:[place]  Orders Received? Or Actions Taken?: .

## 2018-05-16 NOTE — ED Provider Notes (Signed)
[place] EMERGENCY DEPARTMENT Provider Note   CSN: [id number] Arrival date & time: 05/16/18  1324    History   Chief Complaint Chief Complaint  Patient presents with  . Abdominal Pain    HPI [name] is a 84 y.o. female with a past medical history of stage IV CKD, GERD, gastritis, who presents to ED for epigastric pain that began yesterday.  She took Alka-Seltzer with improvement in her symptoms yesterday.  States she woke up this morning with similar symptoms and then took something to help move her bowels with success in her bowel movements today.  States that her pain has resolved.  However, she anticipates that the pain would return so she presented to the ED.  She notes that she has been short of breath since yesterday.  This is unusual for her even with walking short distances.  She denies any chest pain, cough, fever, hemoptysis, leg swelling, history of DVT, PE or MI, urinary symptoms, hematochezia or melena.  States that "this happened last time, they gave me some cocktail to drink and kept me here for 1 night."  She cannot recall any food ingestion that would have triggered her symptoms.  Denies any history of COPD, asthma or tobacco use.     HPI  Past Medical History:  Diagnosis Date  . Anemia due to blood loss, chronic   . Anemia, iron deficiency   . Angiodysplasia of colon 05/09/2005   Single small angiodysplastic lesion in the descending colon found on colonoscopy 05/09/2005 by [name]  . Arthritis    "knots on my thumbs; left elbow" (03/03/2014)  . Cataracts, bilateral   . Chronic ankle pain    bilateral  . Chronic kidney disease, stage IV (severe) ([place])    [name] 02/09/2014  . Chronic knee pain    bilateral  . Chronic thumb pain   . Depression   . Epicondylitis   . Gastritis, Helicobacter pylori    Noted on EGD 01/18/2009 by [name];  biopsy showed chronic gastritis with Helicobacter pylori, treated by [name].  [name]  GERD (gastroesophageal reflux disease)   . Gout   . Hemorrhoids, internal 05/09/2005    Found on colonoscopy 05/09/2005 by [name]  . History of blood transfusion    "low blood count"  . Hypertension   . Osteoporosis, unspecified 03/06/2012   A DXA bone density scan done 02/20/2012 showed osteoporosis with a lumbar spine T-score of -4.4 and a right femur T-score of -2.8.  Patient has chronic kidney disease with estimated GFR less than 30, and it is not clear how much of her osteoporosis may be due to renal osteodystrophy.    . Pericardial effusion 11/2004   S/P subxiphoid pericardial window 11/01/2004 by [name].  A question of possible collagen vascular disease had been raised in 2006 due to arthralgias, a history of idiopathic pericarditis, and increased pigmentation of her palms, and she was referred to [name] but he was unable to confirm a rheumatologic diagnosis.  . Pinguecula   . Renal failure    Formerly on hemodialysis; managed by Dr. Corliss Parish  . Stiffness of joint, not elsewhere classified, hand   . Stroke [facility]) 2007   "when my kidneys went out & I was in a coma; weak LUE/LLE since"  . Thigh pain   . Vitreous detachment    "no OR"    Patient Active Problem List   Diagnosis Date Noted  .  Subjective tinnitus of both ears 01/24/2018  . Chest pain, non-cardiac 05/30/2017  . Benign positional vertigo 09/21/2016  . Preventative health care 09/11/2016  . Vaginal mass 07/19/2016  . Insomnia 05/03/2015  . GERD (gastroesophageal reflux disease) 05/03/2015  . Fatigue 04/20/2015  . Meningioma ([place]) 03/01/2015  . History of CVA (cerebrovascular accident) 01/24/2015  . Gout 03/03/2014  . Hearing loss 11/03/2013  . Osteoporosis 03/06/2012  . Depression 08/30/2009  . Iron deficiency anemia 06/01/2009  . Disease of pericardium 06/01/2009  . CKD (chronic kidney disease) stage 4, GFR 15-29 ml/min ([facility]) 06/01/2009  . CATARACTS 10/24/2005  .  Essential hypertension 10/24/2005  . ANGIODYSPLASIA, COLON 05/09/2005    Past Surgical History:  Procedure Laterality Date  . ABDOMINAL HYSTERECTOMY  1960's  . ARTERIOVENOUS GRAFT PLACEMENT Left 11/28/2005   forearm arteriovenous  . CARDIAC CATHETERIZATION  01/2005   [name] 05/15/2010  . HALLUX VALGUS CORRECTION Left 01/2012   foot  . SUBXYPHOID PERICARDIAL WINDOW  11/01/2004  . THROMBECTOMY Left  03/06/2006   Simple thrombectomy arteriovenous Gore-Tex graft, left arm, with exploration of venous end and intraoperative shuntogram.         . THROMBECTOMY / ARTERIOVENOUS GRAFT REVISION Left 01/21/2006   Thrombectomy and revision of left forearm loop AV graft.  . TUBAL LIGATION  1960's   "before hysterectomy"     OB History   No obstetric history on file.      Home Medications    Prior to Admission medications   Medication Sig Start Date End Date Taking? Authorizing Provider  allopurinol (ZYLOPRIM) 100 MG tablet Take 1.5 tablets (150 mg total) by mouth daily. 01/14/18   [name], MD  amLODipine (NORVASC) 10 MG tablet Take 1 tablet (10 mg total) by mouth at bedtime. 07/09/17   [name], MD  buPROPion (WELLBUTRIN XL) 300 MG 24 hr tablet TAKE 1 TABLET(300 MG) BY MOUTH DAILY 04/03/18   [name], MD  docusate sodium (COLACE) 100 MG capsule Take 100 mg by mouth daily.    [provider]  famotidine (PEPCID) 20 MG tablet Take 1 tablet (20 mg total) by mouth 2 (two) times daily. 01/14/18   [name], MD  furosemide (LASIX) 80 MG tablet Take 0.5 tablets (40 mg total) by mouth daily. 02/05/18   [name], MD  meclizine (ANTIVERT) 12.5 MG tablet TAKE 1 TABLET BY MOUTH TWICE DAILY AS NEEDED FOR DIZZINESS 05/07/18   [name], MD  metoprolol tartrate (LOPRESSOR) 25 MG tablet Take 0.5 tablets (12.5 mg total) by mouth 2 (two) times daily. Patient taking differently: Take 25 mg by mouth 2 (two) times daily.  07/09/17   [name], MD  Multiple  Vitamins-Minerals (MULTIVITAMIN WITH MINERALS) tablet Take 1 tablet by mouth daily. Centrum Silver    [provider]  omeprazole (PRILOSEC) 20 MG capsule Take 1 capsule (20 mg total) by mouth 2 (two) times daily. 02/05/18   [name], MD  SENSIPAR 30 MG tablet TAKE 1 TABLET BY MOUTH ONCE DAILY Patient not taking: Reported on 10/24/2016 10/02/16   [name], MD  zolpidem (AMBIEN) 5 MG tablet Take 1 tablet (5 mg total) by mouth at bedtime as needed for sleep. 01/22/18   Masoudi, Dorthula Rue, MD  FLUoxetine (PROZAC) 10 MG tablet Take 1 tablet (10 mg total) by mouth daily. 03/07/11 03/12/11  [name], MD    Family History Family History  Problem Relation Age of Onset  . Hypertension Brother   . Heart disease Father   . Diabetes  Brother   . Breast cancer Neg Hx   . Colon cancer Neg Hx   . Lung cancer Neg Hx   . Cervical cancer Neg Hx   . Sickle cell anemia Neg Hx     Social History Social History   Tobacco Use  . Smoking status: Former Smoker    Packs/day: 0.10    Years: 1.00    Pack years: 0.10    Types: Cigarettes    Last attempt to quit: 01/02/1960    Years since quitting: 58.4  . Smokeless tobacco: Never Used  . Tobacco comment: patient stated has never smoked   Substance Use Topics  . Alcohol use: No    Alcohol/week: 0.0 standard drinks  . Drug use: No     Allergies   Ibuprofen   Review of Systems Review of Systems  Constitutional: Negative for appetite change, chills and fever.  HENT: Negative for ear pain, rhinorrhea, sneezing and sore throat.   Eyes: Negative for photophobia and visual disturbance.  Respiratory: Positive for shortness of breath. Negative for cough, chest tightness and wheezing.   Cardiovascular: Negative for chest pain and palpitations.  Gastrointestinal: Positive for abdominal pain. Negative for blood in stool, constipation, diarrhea, nausea and vomiting.  Genitourinary: Negative for dysuria, hematuria and urgency.   Musculoskeletal: Negative for myalgias.  Skin: Negative for rash.  Neurological: Negative for dizziness, weakness and light-headedness.     Physical Exam Updated Vital Signs BP 135/86   Pulse 88   Temp 98.7 F (37.1 C)   Resp 17   SpO2 97%   Physical Exam Vitals signs and nursing note reviewed.  Constitutional:      General: She is not in acute distress.    Appearance: She is well-developed.     Comments: Increased WOB with ambulation.  HENT:     Head: Normocephalic and atraumatic.     Nose: Nose normal.  Eyes:     General: No scleral icterus.       Left eye: No discharge.     Conjunctiva/sclera: Conjunctivae normal.  Neck:     Musculoskeletal: Normal range of motion and neck supple.  Cardiovascular:     Rate and Rhythm: Normal rate and regular rhythm.     Heart sounds: Normal heart sounds. No murmur. No friction rub. No gallop.   Pulmonary:     Effort: Pulmonary effort is normal. No respiratory distress.     Breath sounds: Normal breath sounds.  Abdominal:     General: Bowel sounds are normal. There is no distension.     Palpations: Abdomen is soft.     Tenderness: There is no abdominal tenderness. There is no guarding.  Musculoskeletal: Normal range of motion.     Comments: No lower extremity edema, erythema or calf tenderness bilaterally.  Skin:    General: Skin is warm and dry.     Findings: No rash.  Neurological:     Mental Status: She is alert.     Motor: No abnormal muscle tone.     Coordination: Coordination normal.      ED Treatments / Results  Labs (all labs ordered are listed, but only abnormal results are displayed) Labs Reviewed  COMPREHENSIVE METABOLIC PANEL - Abnormal; Notable for the following components:      Result Value   Glucose, Bld 108 (*)    BUN 38 (*)    Creatinine, Ser 2.32 (*)    GFR calc non Af Amer 19 (*)    GFR calc Af  Amer 22 (*)    All other components within normal limits  LIPASE, BLOOD - Abnormal; Notable for the  following components:   Lipase 57 (*)    All other components within normal limits  TROPONIN I - Abnormal; Notable for the following components:   Troponin I 0.03 (*)    All other components within normal limits  CBC WITH DIFFERENTIAL/PLATELET  BRAIN NATRIURETIC PEPTIDE  TROPONIN I    EKG EKG Interpretation  Date/Time:  Friday May 16 2018 13:45:19 EDT Ventricular Rate:  91 PR Interval:    QRS Duration: 110 QT Interval:  373 QTC Calculation: 459 R Axis:   -28 Text Interpretation:  Sinus rhythm Borderline left axis deviation Borderline low voltage, extremity leads Probable anteroseptal infarct, old Confirmed by Lennice Sites [phone]) on 05/16/2018 2:08:28 PM   Radiology No results found.  Procedures Procedures (including critical care time)  Medications Ordered in ED Medications  alum & mag hydroxide-simeth (MAALOX/MYLANTA) 200-200-20 MG/5ML suspension 30 mL (has no administration in time range)    And  lidocaine (XYLOCAINE) 2 % viscous mouth solution 15 mL (has no administration in time range)  pantoprazole (PROTONIX) injection 40 mg (has no administration in time range)     Initial Impression / Assessment and Plan / ED Course  I have reviewed the triage vital signs and the nursing notes.  Pertinent labs & imaging results that were available during my care of the patient were reviewed by me and considered in my medical decision making (see chart for details).  Clinical Course as of May 15 1516  Fri May 16, 2018  1454 Per NT: Pt ambulated to the bathroom and back. O2 stats dropped to 92%.    [HK]    Clinical Course User Index [HK] Delia Heady, PA-C      DENNIE VECCHIO was evaluated in Emergency Department on 05/16/18  for the symptoms described in the history of present illness. He/she was evaluated in the context of the global COVID-19 pandemic, which necessitated consideration that the patient might be at risk for infection with the SARS-CoV-2 virus that  causes COVID-19. Institutional protocols and algorithms that pertain to the evaluation of patients at risk for COVID-19 are in a state of rapid change based on information released by regulatory bodies including the CDC and federal and state organizations. These policies and algorithms were followed during the patient's care in the ED.  84 year old female with a past medical history of stage IV CKD, GERD, gastritis presents to ED for epigastric pain that began yesterday.  She had improvement with Alka-Seltzer but symptoms returned today.  She took a laxative and had improvement in her pain as well as a successful bowel movement.  However, she anticipated that the pain returned so she presented to the ED.  She does note she has been short of breath since yesterday as well.  Denies any chest pain, cough, fever, leg swelling, history of DVT, PE or MI.  He notes that this happened to her in the past and appears that she was admitted for observation at that time with a mildly elevated BNP and similar GI and respiratory symptoms.  On my exam patient is overall well-appearing.  She has some increased work of breathing as I watched her ambulate from the bathroom to her room. When sitting, she remains in NAD. No lower extremity edema or calf tenderness that would concern me for DVT.  Abdomen is soft, nontender nondistended.  She is asymptomatic as far as chest  and abdominal pain on my exam.  Due to her age, risk factors plan to check labs, chest x-ray, EKG and reassess.  3:15 PM EKG shows sinus rhythm, tracing similar to prior.  Troponin elevated 0.03 which is similar to prior.  Kidney function at baseline.  Patient will need delta troponin, CT of the abdomen pelvis to rule out acute process and reassessment after medications. Care handed off at shift change, if workup negative will dispo home with PCP f/u.   Final Clinical Impressions(s) / ED Diagnoses   Final diagnoses:  None    ED Discharge Orders    None        Portions of this note were generated with Dragon dictation software. Dictation errors may occur despite best attempts at proofreading.    Delia Heady, PA-C 05/16/18 1518    Lennice Sites, DO 05/16/18 1626

## 2018-05-21 ENCOUNTER — Ambulatory Visit (INDEPENDENT_AMBULATORY_CARE_PROVIDER_SITE_OTHER): Payer: Medicare Other | Admitting: Internal Medicine

## 2018-05-21 ENCOUNTER — Other Ambulatory Visit: Payer: Self-pay

## 2018-05-21 ENCOUNTER — Encounter: Payer: Self-pay | Admitting: Internal Medicine

## 2018-05-21 VITALS — BP 145/75 | HR 99 | Temp 98.8°F | Wt 156.3 lb

## 2018-05-21 DIAGNOSIS — G4700 Insomnia, unspecified: Secondary | ICD-10-CM

## 2018-05-21 DIAGNOSIS — K219 Gastro-esophageal reflux disease without esophagitis: Secondary | ICD-10-CM | POA: Diagnosis not present

## 2018-05-21 DIAGNOSIS — F329 Major depressive disorder, single episode, unspecified: Secondary | ICD-10-CM

## 2018-05-21 DIAGNOSIS — K5732 Diverticulitis of large intestine without perforation or abscess without bleeding: Secondary | ICD-10-CM | POA: Insufficient documentation

## 2018-05-21 DIAGNOSIS — Z79899 Other long term (current) drug therapy: Secondary | ICD-10-CM

## 2018-05-21 DIAGNOSIS — I10 Essential (primary) hypertension: Secondary | ICD-10-CM | POA: Diagnosis not present

## 2018-05-21 DIAGNOSIS — F32A Depression, unspecified: Secondary | ICD-10-CM

## 2018-05-21 HISTORY — DX: Diverticulitis of large intestine without perforation or abscess without bleeding: K57.32

## 2018-05-21 MED ORDER — MELATONIN 5 MG PO CAPS: 5.0000 mg | ORAL_CAPSULE | Freq: Every day | ORAL | 2 refills | Status: DC

## 2018-05-21 MED ORDER — METOPROLOL TARTRATE 25 MG PO TABS: 12.5000 mg | ORAL_TABLET | Freq: Two times a day (BID) | ORAL | 3 refills | Status: DC

## 2018-05-21 NOTE — Assessment & Plan Note (Signed)
Recent ED visit due to descending colon diverticulitis. Her symptoms has been improved with Augmentin. Denies any abdominal pain or bloody stool.  -She will complete total of 7 days of Augmentin, today day 5.

## 2018-05-21 NOTE — Progress Notes (Signed)
Internal Medicine Clinic Attending  Case discussed with Dr. Amin at the time of the visit.  We reviewed the resident's history and exam and pertinent patient test results.  I agree with the assessment, diagnosis, and plan of care documented in the resident's note.    

## 2018-05-21 NOTE — Assessment & Plan Note (Signed)
Is complaining of insomnia.  Previously prescribed Ambien causes hallucinations.  -Prescribe her melatonin 5 mg at bedtime as needed.

## 2018-05-21 NOTE — Progress Notes (Signed)
CC: For emergency room follow-up.  HPI:  [name] is a 84 y.o. with past medical history as listed below came to the clinic for follow-up after having a recent visit to ED on 05/16/2018 due to abdominal pain and nausea, CT abdomen shows descending colon diverticulitis and she was given Augmentin for 7 days.  Patient is feeling better since then.  Denies any abdominal pain, nausea or vomiting.  Denies any diarrhea.  Her appetite is still poor, but she said this is been going on for about a year now.  Denies any chest pain or epigastric pain.  No GERD symptoms.  Denies any exertional dyspnea or orthopnea.  She is feeling more depressed and having some difficulty with sleep. Denies any fever or chills, no upper respiratory symptoms, no sick contacts or recent travel. No urinary symptoms.  Please see assessment and plan for her chronic conditions.  Past Medical History:  Diagnosis Date   Anemia due to blood loss, chronic    Anemia, iron deficiency    Angiodysplasia of colon 05/09/2005   Single small angiodysplastic lesion in the descending colon found on colonoscopy 05/09/2005 by [name]   Arthritis    "knots on my thumbs; left elbow" (03/03/2014)   Cataracts, bilateral    Chronic ankle pain    bilateral   Chronic kidney disease, stage IV (severe) ([place])    [name] 02/09/2014   Chronic knee pain    bilateral   Chronic thumb pain    Depression    Epicondylitis    Gastritis, Helicobacter pylori    Noted on EGD 01/18/2009 by [name];  biopsy showed chronic gastritis with Helicobacter pylori, treated by [name].   GERD (gastroesophageal reflux disease)    Gout    Hemorrhoids, internal 05/09/2005    Found on colonoscopy 05/09/2005 by [name]   History of blood transfusion    "low blood count"   Hypertension    Osteoporosis, unspecified 03/06/2012   A DXA bone density scan done 02/20/2012 showed osteoporosis with a lumbar spine T-score  of -4.4 and a right femur T-score of -2.8.  Patient has chronic kidney disease with estimated GFR less than 30, and it is not clear how much of her osteoporosis may be due to renal osteodystrophy.     Pericardial effusion 11/2004   S/P subxiphoid pericardial window 11/01/2004 by [name].  A question of possible collagen vascular disease had been raised in 2006 due to arthralgias, a history of idiopathic pericarditis, and increased pigmentation of her palms, and she was referred to [name] but he was unable to confirm a rheumatologic diagnosis.   Pinguecula    Renal failure    Formerly on hemodialysis; managed by Dr. Corliss Parish   Stiffness of joint, not elsewhere classified, hand    Stroke [facility]) 2007   "when my kidneys went out & I was in a coma; weak LUE/LLE since"   Thigh pain    Vitreous detachment    "no OR"   Review of Systems: Negative except mentioned in HPI.  Physical Exam:  Vitals:   05/21/18 0913  BP: (!) 145/75  Pulse: 99  Temp: 98.8 F (37.1 C)  TempSrc: Oral  SpO2: 100%  Weight: 156 lb 4.8 oz (70.9 kg)   General: Vital signs reviewed.  Patient is well-developed and well-nourished, in no acute distress and cooperative with exam.  Head: Normocephalic and atraumatic. Eyes: EOMI, conjunctivae normal, no scleral icterus.  Cardiovascular:  Mild sinus tachycardia, no murmurs, gallops, or rubs. Pulmonary/Chest: Clear to auscultation bilaterally, no wheezes, rales, or rhonchi. Abdominal: Soft, non-tender, non-distended, BS +ve. Extremities: Trace lower extremity edema bilaterally,  pulses symmetric and intact bilaterally.  Skin: Warm, dry and intact. No rashes or erythema. Psychiatric: Normal mood and affect. speech and behavior is normal. Cognition and memory are normal.  Assessment & Plan:   See Encounters Tab for problem based charting.  Patient discussed with Dr. Angelia Mould.

## 2018-05-21 NOTE — Assessment & Plan Note (Signed)
BP Readings from Last 3 Encounters:  05/21/18 (!) 145/75  05/16/18 (!) 144/91  01/22/18 137/81   Pressure was elevated.  She was also mildly tachycardic.  Per chart review she was supposed to take Toprol 12.5 mg twice daily, patient did not taking it anymore stating that they did not gave me that. It looks like she is not very compliant with her medication although stating that she is trying to take it daily but to miss some doses.  -Discussed the importance of taking her medications regularly. -Restarted metoprolol 12.5 mg twice daily. -Continue amlodipine and Lasix. -We will reevaluate during next follow-up visit.

## 2018-05-21 NOTE — Patient Instructions (Signed)
Thank you for visiting clinic today. As we discussed you will finish existing Wellbutrin by taking 2 pills at a time and then start taking your new prescription of 300 mg daily. For your sleep you can try melatonin 5 mg at bedtime-prescription has been sent to your pharmacy. Your heart rate was little high and you was not taking your metoprolol as listed in your chart, I reordered that, please take it from your pharmacy and start taking 12.5 mg twice daily. If your GERD symptoms has been improved you can either stop taking omeprazole and Pepcid or just take 1 tablet daily if you restart your GERD symptoms. Complete your antibiotic course by finishing up all the tablets in the bottle. Continue taking the rest of your medications as directed. Please follow-up with your PCP in 1 month.

## 2018-05-21 NOTE — Assessment & Plan Note (Signed)
Her GERD symptoms has been improved and she has some questions regarding continuation of omeprazole and Pepcid.  Advised patient to stop taking omeprazole and Pepcid if she does not have any more GERD symptoms.  If her GERD symptoms restarts she can restart omeprazole daily instead of twice daily and see if that will help with her symptoms.

## 2018-05-21 NOTE — Assessment & Plan Note (Signed)
Was complaining of worsening of her depression with PHQ 9 score of 19. Start her medication pills and she still has Wellbutrin XL 150 mg daily prescription which was filled on 01/14/2018 with quite a bit of pills left.  Her Wellbutrin was increased to 300 mg daily by PCP last month, she has not started taking the new prescription yet.  -Counseled her regarding taking her medications regularly and as directed to help with her symptoms.  She was advised to take existing Wellbutrin by taking 2 pills at a time and then get the new prescription of 300 mg which she will take 1 pill daily.  Patient seems understanding.

## 2018-05-24 ENCOUNTER — Other Ambulatory Visit: Payer: Self-pay

## 2018-05-24 ENCOUNTER — Inpatient Hospital Stay (HOSPITAL_COMMUNITY)
Admission: EM | Admit: 2018-05-24 | Discharge: 2018-05-26 | DRG: 439 | Disposition: A | Discharge status: Home / Self Care | Payer: Medicare Other | Attending: Internal Medicine | Admitting: Internal Medicine

## 2018-05-24 ENCOUNTER — Encounter (HOSPITAL_COMMUNITY): Payer: Self-pay | Admitting: Emergency Medicine

## 2018-05-24 ENCOUNTER — Emergency Department (HOSPITAL_COMMUNITY): Payer: Medicare Other

## 2018-05-24 DIAGNOSIS — K859 Acute pancreatitis without necrosis or infection, unspecified: Secondary | ICD-10-CM | POA: Diagnosis not present

## 2018-05-24 DIAGNOSIS — Z8673 Personal history of transient ischemic attack (TIA), and cerebral infarction without residual deficits: Secondary | ICD-10-CM | POA: Diagnosis not present

## 2018-05-24 DIAGNOSIS — M810 Age-related osteoporosis without current pathological fracture: Secondary | ICD-10-CM | POA: Diagnosis not present

## 2018-05-24 DIAGNOSIS — Z8719 Personal history of other diseases of the digestive system: Secondary | ICD-10-CM | POA: Diagnosis not present

## 2018-05-24 DIAGNOSIS — I129 Hypertensive chronic kidney disease with stage 1 through stage 4 chronic kidney disease, or unspecified chronic kidney disease: Secondary | ICD-10-CM | POA: Diagnosis present

## 2018-05-24 DIAGNOSIS — Z87891 Personal history of nicotine dependence: Secondary | ICD-10-CM | POA: Diagnosis not present

## 2018-05-24 DIAGNOSIS — G4700 Insomnia, unspecified: Secondary | ICD-10-CM | POA: Diagnosis present

## 2018-05-24 DIAGNOSIS — Z1159 Encounter for screening for other viral diseases: Secondary | ICD-10-CM

## 2018-05-24 DIAGNOSIS — Z8249 Family history of ischemic heart disease and other diseases of the circulatory system: Secondary | ICD-10-CM

## 2018-05-24 DIAGNOSIS — N184 Chronic kidney disease, stage 4 (severe): Secondary | ICD-10-CM | POA: Diagnosis present

## 2018-05-24 DIAGNOSIS — F329 Major depressive disorder, single episode, unspecified: Secondary | ICD-10-CM | POA: Diagnosis present

## 2018-05-24 DIAGNOSIS — Z833 Family history of diabetes mellitus: Secondary | ICD-10-CM | POA: Diagnosis not present

## 2018-05-24 DIAGNOSIS — D631 Anemia in chronic kidney disease: Secondary | ICD-10-CM | POA: Diagnosis present

## 2018-05-24 DIAGNOSIS — R1084 Generalized abdominal pain: Secondary | ICD-10-CM | POA: Diagnosis not present

## 2018-05-24 DIAGNOSIS — Z886 Allergy status to analgesic agent status: Secondary | ICD-10-CM | POA: Diagnosis not present

## 2018-05-24 DIAGNOSIS — K219 Gastro-esophageal reflux disease without esophagitis: Secondary | ICD-10-CM | POA: Diagnosis present

## 2018-05-24 DIAGNOSIS — Z03818 Encounter for observation for suspected exposure to other biological agents ruled out: Secondary | ICD-10-CM | POA: Diagnosis not present

## 2018-05-24 DIAGNOSIS — Z79899 Other long term (current) drug therapy: Secondary | ICD-10-CM | POA: Diagnosis not present

## 2018-05-24 DIAGNOSIS — N200 Calculus of kidney: Secondary | ICD-10-CM | POA: Diagnosis not present

## 2018-05-24 DIAGNOSIS — K8689 Other specified diseases of pancreas: Secondary | ICD-10-CM | POA: Diagnosis not present

## 2018-05-24 DIAGNOSIS — N179 Acute kidney failure, unspecified: Secondary | ICD-10-CM | POA: Diagnosis not present

## 2018-05-24 LAB — CBC WITH DIFFERENTIAL/PLATELET
Abs Immature Granulocytes: 0 10*3/uL (ref 0.00–0.07)
Basophils Absolute: 0 10*3/uL (ref 0.0–0.1)
Basophils Relative: 1 %
Eosinophils Absolute: 0.3 10*3/uL (ref 0.0–0.5)
Eosinophils Relative: 8 %
HCT: 39.5 % (ref 36.0–46.0)
Hemoglobin: 12.8 g/dL (ref 12.0–15.0)
Immature Granulocytes: 0 %
Lymphocytes Relative: 35 %
Lymphs Abs: 1.2 10*3/uL (ref 0.7–4.0)
MCH: 31.4 pg (ref 26.0–34.0)
MCHC: 32.4 g/dL (ref 30.0–36.0)
MCV: 96.8 fL (ref 80.0–100.0)
Monocytes Absolute: 0.4 10*3/uL (ref 0.1–1.0)
Monocytes Relative: 11 %
Neutro Abs: 1.6 10*3/uL — ABNORMAL LOW (ref 1.7–7.7)
Neutrophils Relative %: 45 %
Platelets: 218 10*3/uL (ref 150–400)
RBC: 4.08 MIL/uL (ref 3.87–5.11)
RDW: 14 % (ref 11.5–15.5)
WBC: 3.5 10*3/uL — ABNORMAL LOW (ref 4.0–10.5)
nRBC: 0 % (ref 0.0–0.2)

## 2018-05-24 LAB — COMPREHENSIVE METABOLIC PANEL
ALT: 18 U/L (ref 0–44)
AST: 27 U/L (ref 15–41)
Albumin: 3.6 g/dL (ref 3.5–5.0)
Alkaline Phosphatase: 109 U/L (ref 38–126)
Anion gap: 12 (ref 5–15)
BUN: 49 mg/dL — ABNORMAL HIGH (ref 8–23)
CO2: 25 mmol/L (ref 22–32)
Calcium: 9.2 mg/dL (ref 8.9–10.3)
Chloride: 101 mmol/L (ref 98–111)
Creatinine, Ser: 2.92 mg/dL — ABNORMAL HIGH (ref 0.44–1.00)
GFR calc Af Amer: 17 mL/min — ABNORMAL LOW (ref >60)
GFR calc non Af Amer: 14 mL/min — ABNORMAL LOW (ref >60)
Glucose, Bld: 99 mg/dL (ref 70–99)
Potassium: 4.9 mmol/L (ref 3.5–5.1)
Sodium: 138 mmol/L (ref 135–145)
Total Bilirubin: 0.7 mg/dL (ref 0.3–1.2)
Total Protein: 7 g/dL (ref 6.5–8.1)

## 2018-05-24 LAB — URINALYSIS, ROUTINE W REFLEX MICROSCOPIC
Bacteria, UA: NONE SEEN
Bilirubin Urine: NEGATIVE
Glucose, UA: NEGATIVE mg/dL
Hgb urine dipstick: NEGATIVE
Ketones, ur: NEGATIVE mg/dL
Nitrite: NEGATIVE
Protein, ur: NEGATIVE mg/dL
Specific Gravity, Urine: 1.017 (ref 1.005–1.030)
pH: 6 (ref 5.0–8.0)

## 2018-05-24 LAB — LIPASE, BLOOD: Lipase: 50 U/L (ref 11–51)

## 2018-05-24 LAB — LIPID PANEL
Cholesterol: 203 mg/dL — ABNORMAL HIGH (ref 0–200)
HDL: 81 mg/dL (ref >40)
LDL Cholesterol: 105 mg/dL — ABNORMAL HIGH (ref 0–99)
Total CHOL/HDL Ratio: 2.5 RATIO
Triglycerides: 84 mg/dL (ref <150)
VLDL: 17 mg/dL (ref 0–40)

## 2018-05-24 LAB — SARS CORONAVIRUS 2 BY RT PCR (HOSPITAL ORDER, PERFORMED IN ~~LOC~~ HOSPITAL LAB): SARS Coronavirus 2: NEGATIVE

## 2018-05-24 MED ORDER — ZOLPIDEM TARTRATE 5 MG PO TABS
5.0000 mg | ORAL_TABLET | Freq: Every evening | ORAL | Status: DC | PRN
  Administered 2018-05-24: 5 mg via ORAL
  Filled 2018-05-24: qty 1

## 2018-05-24 MED ORDER — ACETAMINOPHEN 650 MG RE SUPP: 650.0000 mg | Freq: Four times a day (QID) | RECTAL | Status: DC | PRN

## 2018-05-24 MED ORDER — SODIUM CHLORIDE 0.9 % IV BOLUS
1000.0000 mL | Freq: Once | INTRAVENOUS | Status: AC
  Administered 2018-05-24: 1000 mL via INTRAVENOUS

## 2018-05-24 MED ORDER — BUPROPION HCL ER (XL) 150 MG PO TB24
300.0000 mg | ORAL_TABLET | Freq: Every day | ORAL | Status: DC
  Administered 2018-05-25 – 2018-05-26 (×2): 300 mg via ORAL
  Filled 2018-05-24 (×2): qty 2

## 2018-05-24 MED ORDER — ENOXAPARIN SODIUM 30 MG/0.3ML ~~LOC~~ SOLN
30.0000 mg | SUBCUTANEOUS | Status: DC
  Administered 2018-05-24 – 2018-05-25 (×2): 30 mg via SUBCUTANEOUS
  Filled 2018-05-24 (×2): qty 0.3

## 2018-05-24 MED ORDER — MORPHINE SULFATE (PF) 4 MG/ML IV SOLN
4.0000 mg | Freq: Once | INTRAVENOUS | Status: AC
  Administered 2018-05-24: 4 mg via INTRAVENOUS
  Filled 2018-05-24: qty 1

## 2018-05-24 MED ORDER — AMLODIPINE BESYLATE 5 MG PO TABS
10.0000 mg | ORAL_TABLET | Freq: Every day | ORAL | Status: DC
  Filled 2018-05-24: qty 2

## 2018-05-24 MED ORDER — HYDROMORPHONE HCL 1 MG/ML IJ SOLN
1.0000 mg | Freq: Four times a day (QID) | INTRAMUSCULAR | Status: DC | PRN
  Administered 2018-05-24 – 2018-05-26 (×2): 1 mg via INTRAVENOUS
  Filled 2018-05-24 (×2): qty 1

## 2018-05-24 MED ORDER — LACTATED RINGERS IV SOLN
INTRAVENOUS | Status: DC
  Administered 2018-05-24: 19:00:00 via INTRAVENOUS
  Administered 2018-05-26: 50 mL/h via INTRAVENOUS

## 2018-05-24 MED ORDER — METOPROLOL TARTRATE 25 MG PO TABS: 12.5000 mg | ORAL_TABLET | Freq: Two times a day (BID) | ORAL | Status: DC

## 2018-05-24 MED ORDER — HYDROMORPHONE HCL 1 MG/ML IJ SOLN: 1.0000 mg | INTRAMUSCULAR | Status: DC | PRN

## 2018-05-24 MED ORDER — ACETAMINOPHEN 325 MG PO TABS
650.0000 mg | ORAL_TABLET | Freq: Four times a day (QID) | ORAL | Status: DC | PRN
  Administered 2018-05-25 – 2018-05-26 (×2): 650 mg via ORAL
  Filled 2018-05-24 (×2): qty 2

## 2018-05-24 NOTE — ED Notes (Signed)
ED TO INPATIENT HANDOFF REPORT  ED Nurse Name and Phone #: [phone] Doreene Adas Name/Age/Gender [name] 84 y.o. female Room/Bed: 032C/032C  Code Status   Code Status: Full Code  Home/SNF/Other Home Patient oriented to: self Is this baseline? Yes   Triage Complete: Triage complete  Chief Complaint stomach pain  Triage Note Pt in with c/o generalized abdominal pain. States she was seen 5/15 and told to return if s/s persisted. Denies any n/v/d or fevers   Allergies Allergies  Allergen Reactions  . Ibuprofen Other (See Comments)    PT limited intake because of chronic kidney DX    Level of Care/Admitting Diagnosis ED Disposition    ED Disposition Condition Comment   Admit  Hospital Area: [facility] [[id number]]  Level of Care: Med-Surg [16]  Covid Evaluation: N/A  Diagnosis: Acute pancreatitis [577.0.ICD-9-CM]  Admitting Physician: Sid Falcon [phone]  Attending Physician: Sid Falcon [phone]  Estimated length of stay: past midnight tomorrow  Certification:: I certify this patient will need inpatient services for at least 2 midnights  PT Class (Do Not Modify): Inpatient [101]  PT Acc Code (Do Not Modify): Private [1]       B Medical/Surgery History Past Medical History:  Diagnosis Date  . Anemia due to blood loss, chronic   . Anemia, iron deficiency   . Angiodysplasia of colon 05/09/2005   Single small angiodysplastic lesion in the descending colon found on colonoscopy 05/09/2005 by [name]  . Arthritis    "knots on my thumbs; left elbow" (03/03/2014)  . Cataracts, bilateral   . Chronic ankle pain    bilateral  . Chronic kidney disease, stage IV (severe) ([place])    [name] 02/09/2014  . Chronic knee pain    bilateral  . Chronic thumb pain   . Depression   . Epicondylitis   . Gastritis, Helicobacter pylori    Noted on EGD 01/18/2009 by [name];  biopsy showed chronic gastritis with Helicobacter pylori, treated by  [name].  [name] GERD (gastroesophageal reflux disease)   . Gout   . Hemorrhoids, internal 05/09/2005    Found on colonoscopy 05/09/2005 by [name]  . History of blood transfusion    "low blood count"  . Hypertension   . Osteoporosis, unspecified 03/06/2012   A DXA bone density scan done 02/20/2012 showed osteoporosis with a lumbar spine T-score of -4.4 and a right femur T-score of -2.8.  Patient has chronic kidney disease with estimated GFR less than 30, and it is not clear how much of her osteoporosis may be due to renal osteodystrophy.    . Pericardial effusion 11/2004   S/P subxiphoid pericardial window 11/01/2004 by [name].  A question of possible collagen vascular disease had been raised in 2006 due to arthralgias, a history of idiopathic pericarditis, and increased pigmentation of her palms, and she was referred to [name] but he was unable to confirm a rheumatologic diagnosis.  . Pinguecula   . Renal failure    Formerly on hemodialysis; managed by Dr. Corliss Parish  . Stiffness of joint, not elsewhere classified, hand   . Stroke [facility]) 2007   "when my kidneys went out & I was in a coma; weak LUE/LLE since"  . Thigh pain   . Vitreous detachment    "no OR"   Past Surgical History:  Procedure Laterality Date  . ABDOMINAL HYSTERECTOMY  1960's  . ARTERIOVENOUS GRAFT PLACEMENT Left 11/28/2005  forearm arteriovenous  . CARDIAC CATHETERIZATION  01/2005   [name] 05/15/2010  . HALLUX VALGUS CORRECTION Left 01/2012   foot  . SUBXYPHOID PERICARDIAL WINDOW  11/01/2004  . THROMBECTOMY Left  03/06/2006   Simple thrombectomy arteriovenous Gore-Tex graft, left arm, with exploration of venous end and intraoperative shuntogram.         . THROMBECTOMY / ARTERIOVENOUS GRAFT REVISION Left 01/21/2006   Thrombectomy and revision of left forearm loop AV graft.  . TUBAL LIGATION  1960's   "before hysterectomy"     A IV Location/Drains/Wounds Patient  Lines/Drains/Airways Status   Active Line/Drains/Airways    Name:   Placement date:   Placement time:   Site:   Days:   Peripheral IV 05/24/18 Right Antecubital   05/24/18    1434    Antecubital   less than 1   Fistula / Graft Left Forearm Arteriovenous vein graft   -    -    Forearm             Intake/Output Last 24 hours No intake or output data in the 24 hours ending 05/24/18 1705  Labs/Imaging Results for orders placed or performed during the hospital encounter of 05/24/18 (from the past 48 hour(s))  SARS Coronavirus 2 (CEPHEID - Performed in [place] hospital lab), Hosp Order     Status: None   Collection Time: 05/24/18  2:16 PM  Result Value Ref Range   SARS Coronavirus 2 NEGATIVE NEGATIVE    Comment: (NOTE) If result is NEGATIVE SARS-CoV-2 target nucleic acids are NOT DETECTED. The SARS-CoV-2 RNA is generally detectable in upper and lower  respiratory specimens during the acute phase of infection. The lowest  concentration of SARS-CoV-2 viral copies this assay can detect is 250  copies / mL. A negative result does not preclude SARS-CoV-2 infection  and should not be used as the sole basis for treatment or other  patient management decisions.  A negative result may occur with  improper specimen collection / handling, submission of specimen other  than nasopharyngeal swab, presence of viral mutation(s) within the  areas targeted by this assay, and inadequate number of viral copies  (<250 copies / mL). A negative result must be combined with clinical  observations, patient history, and epidemiological information. If result is POSITIVE SARS-CoV-2 target nucleic acids are DETECTED. The SARS-CoV-2 RNA is generally detectable in upper and lower  respiratory specimens dur ing the acute phase of infection.  Positive  results are indicative of active infection with SARS-CoV-2.  Clinical  correlation with patient history and other diagnostic information is  necessary to  determine patient infection status.  Positive results do  not rule out bacterial infection or co-infection with other viruses. If result is PRESUMPTIVE POSTIVE SARS-CoV-2 nucleic acids MAY BE PRESENT.   A presumptive positive result was obtained on the submitted specimen  and confirmed on repeat testing.  While 2019 novel coronavirus  (SARS-CoV-2) nucleic acids may be present in the submitted sample  additional confirmatory testing may be necessary for epidemiological  and / or clinical management purposes  to differentiate between  SARS-CoV-2 and other Sarbecovirus currently known to infect humans.  If clinically indicated additional testing with an alternate test  methodology [id number]) is advised. The SARS-CoV-2 RNA is generally  detectable in upper and lower respiratory sp ecimens during the acute  phase of infection. The expected result is Negative. Fact Sheet for Patients:  StrictlyIdeas.no Fact Sheet for Healthcare Providers: BankingDealers.co.za This test is not yet  approved or cleared by the [place] and has been authorized for detection and/or diagnosis of SARS-CoV-2 by FDA under an Emergency Use Authorization (EUA).  This EUA will remain in effect (meaning this test can be used) for the duration of the COVID-19 declaration under Section 564(b)(1) of the Act, 21 U.S.C. section 360bbb-3(b)(1), unless the authorization is terminated or revoked sooner. Performed at [facility], [place] [address], [place], [place] [address]   CBC with Differential     Status: Abnormal   Collection Time: 05/24/18  2:35 PM  Result Value Ref Range   WBC 3.5 (L) 4.0 - 10.5 K/uL   RBC 4.08 3.87 - 5.11 MIL/uL   Hemoglobin 12.8 12.0 - 15.0 g/dL   HCT 39.5 36.0 - 46.0 %   MCV 96.8 80.0 - 100.0 fL   MCH 31.4 26.0 - 34.0 pg   MCHC 32.4 30.0 - 36.0 g/dL   RDW 14.0 11.5 - 15.5 %   Platelets 218 150 - 400 K/uL   nRBC 0.0 0.0 - 0.2 %    Neutrophils Relative % 45 %   Neutro Abs 1.6 (L) 1.7 - 7.7 K/uL   Lymphocytes Relative 35 %   Lymphs Abs 1.2 0.7 - 4.0 K/uL   Monocytes Relative 11 %   Monocytes Absolute 0.4 0.1 - 1.0 K/uL   Eosinophils Relative 8 %   Eosinophils Absolute 0.3 0.0 - 0.5 K/uL   Basophils Relative 1 %   Basophils Absolute 0.0 0.0 - 0.1 K/uL   Immature Granulocytes 0 %   Abs Immature Granulocytes 0.00 0.00 - 0.07 K/uL    Comment: Performed at [facility] [address], [place], [place] [address]  Comprehensive metabolic panel     Status: Abnormal   Collection Time: 05/24/18  2:35 PM  Result Value Ref Range   Sodium 138 135 - 145 mmol/L   Potassium 4.9 3.5 - 5.1 mmol/L   Chloride 101 98 - 111 mmol/L   CO2 25 22 - 32 mmol/L   Glucose, Bld 99 70 - 99 mg/dL   BUN 49 (H) 8 - 23 mg/dL   Creatinine, Ser 2.92 (H) 0.44 - 1.00 mg/dL   Calcium 9.2 8.9 - 10.3 mg/dL   Total Protein 7.0 6.5 - 8.1 g/dL   Albumin 3.6 3.5 - 5.0 g/dL   AST 27 15 - 41 U/L   ALT 18 0 - 44 U/L   Alkaline Phosphatase 109 38 - 126 U/L   Total Bilirubin 0.7 0.3 - 1.2 mg/dL   GFR calc non Af Amer 14 (L) >60 mL/min   GFR calc Af Amer 17 (L) >60 mL/min   Anion gap 12 5 - 15    Comment: Performed at [facility] [address] [place], [place] [address]  Lipase, blood     Status: None   Collection Time: 05/24/18  2:35 PM  Result Value Ref Range   Lipase 50 11 - 51 U/L    Comment: Performed at [facility], [place] [address], [place], [place] [address]   Ct Abdomen Pelvis Wo Contrast  Result Date: 05/24/2018 CLINICAL DATA:  Generalized abdominal pain for 1 week. No fever, nausea, vomiting or diarrhea. EXAM: CT ABDOMEN AND PELVIS WITHOUT CONTRAST TECHNIQUE: Multidetector CT imaging of the abdomen and pelvis was performed following the standard protocol without IV contrast. COMPARISON:  Prior CTs 05/16/2018 and 09/11/2016. FINDINGS: Lower chest: New trace bilateral pleural effusions. There is mild underlying emphysema.  Hepatobiliary: No hepatic abnormalities are demonstrated on noncontrast imaging. No evidence of gallstones, gallbladder wall thickening or biliary dilatation. Pancreas: The pancreas is ill-defined with new surrounding inflammatory changes. The main pancreatic duct is moderately dilated to 9 mm. No parenchymal calcification or focal fluid collection identified. Spleen: Normal in size without focal abnormality. Adrenals/Urinary Tract: Both adrenal glands appear normal. Stable nonobstructing calculus in the lower pole of the left kidney. Both kidneys demonstrate cortical thinning. There is no evidence ureteral calculus or hydronephrosis. Small exophytic lesion involving the upper pole of the left kidney is again noted, likely a cyst. The bladder appears unremarkable. Stomach/Bowel: No evidence of bowel wall thickening, distention or surrounding inflammatory change. Distal colonic diverticular changes are again noted without residual pericolonic inflammatory change. Vascular/Lymphatic: There are no enlarged abdominal or pelvic lymph nodes. There is new soft tissue stranding throughout the retroperitoneal fat, extending into the retrocrural areas bilaterally. There is aortic and branch vessel atherosclerosis. Multiple right-sided gonadal vein phleboliths are present. Reproductive: Hysterectomy. No adnexal mass. There is a vaginal pessary. Other: Trace ascites without focal extraluminal fluid collection or free air. Musculoskeletal: No acute or significant osseous findings. Stable convex left lumbar scoliosis and degenerative anterolisthesis at L4-5. IMPRESSION: 1. New ill-defined enlargement of the pancreas with surrounding inflammatory changes and pancreatic ductal dilatation. Findings are most consistent with acute pancreatitis, especially given the elevated serum lipase level last week. 2. No evidence of gallstones or biliary dilatation. 3. Distal colonic diverticulosis without residual surrounding inflammation. 4.  Nonobstructing left renal calculus. 5.  Aortic Atherosclerosis (ICD10-I70.0). Electronically Signed   By: Richardean Sale M.D.   On: 05/24/2018 16:00    Pending Labs Unresulted Labs (From admission, onward)    Start     Ordered   05/25/18 0500  Comprehensive metabolic panel  Tomorrow morning,   R     05/24/18 1643   05/25/18 0500  CBC  Tomorrow morning,   R     05/24/18 1643   05/24/18 1659  Lipid panel  Add-on,   R     05/24/18 1659   05/24/18 1359  Urinalysis, Routine w reflex microscopic  Once,   R     05/24/18 1359          Vitals/Pain Today's Vitals   05/24/18 1515 05/24/18 1630 05/24/18 1645 05/24/18 1700  BP: 132/82 (!) 143/76 (!) 169/82   Pulse: (!) 50 (!) 57 69   Temp:      TempSrc:      SpO2: 92% 98% 98%   Weight:      PainSc:    0-No pain    Isolation Precautions No active isolations  Medications Medications  sodium chloride 0.9 % bolus 1,000 mL (has no administration in time range)  enoxaparin (LOVENOX) injection 30 mg (has no administration in time range)  acetaminophen (TYLENOL) tablet 650 mg (has no administration in time range)    Or  acetaminophen (TYLENOL) suppository 650 mg (has no administration in time range)  lactated ringers infusion (has no administration in time range)  HYDROmorphone (DILAUDID) injection 1 mg (has no administration in time range)  zolpidem (AMBIEN) tablet 5 mg (has no administration in time range)  buPROPion (WELLBUTRIN XL) 24 hr tablet 300 mg (has no administration in time range)  amLODipine (NORVASC) tablet 10 mg (has no administration in time range)  morphine 4 MG/ML injection 4 mg (4 mg Intravenous Given 05/24/18 1441)    Mobility walks Low fall risk   Focused Assessments  R Recommendations: See Admitting Provider Note  Report given to:   Additional Notes:

## 2018-05-24 NOTE — ED Triage Notes (Signed)
Pt in with c/o generalized abdominal pain. States she was seen 5/15 and told to return if s/s persisted. Denies any n/v/d or fevers

## 2018-05-24 NOTE — ED Notes (Signed)
Admitting at bedside 

## 2018-05-24 NOTE — ED Provider Notes (Signed)
[place] EMERGENCY DEPARTMENT Provider Note   CSN: [id number] Arrival date & time: 05/24/18  1326    History   Chief Complaint Chief Complaint  Patient presents with  . Abdominal Pain    HPI [name] is a 84 y.o. female presenting for evaluation of abdominal pain.  Patient states she started having abdominal pain about 10 days ago.  She was seen in the ED, diagnosed with diverticulitis and given antibiotics and pain control.  Symptoms were under control until yesterday, when abdominal pain became much more severe.  Patient states over the past 2 days, pain has been severe and constant in her lower abdomen.  Nothing makes the pain better.  She denies known fevers, chills, nausea, vomiting, normal urination, normal bowel movements.  No change in pain with bowel movements.  She denies blood in her stool.  She denies history of similar. Patient reports history of pocket of fluid in her abdomen requiring drainage. Per chart review, likely pericardial effusion for which she had a subxiphoid pericardial window.  Additional history obtained from chart review, patient with a history of chronic anemia, kidney disease, CVA, H pylori primary gastritis.  Patient was on dialysis due to renal failure, however her kidney function improved so that she is no longer on dialysis.     HPI  Past Medical History:  Diagnosis Date  . Anemia due to blood loss, chronic   . Anemia, iron deficiency   . Angiodysplasia of colon 05/09/2005   Single small angiodysplastic lesion in the descending colon found on colonoscopy 05/09/2005 by [name]  . Arthritis    "knots on my thumbs; left elbow" (03/03/2014)  . Cataracts, bilateral   . Chronic ankle pain    bilateral  . Chronic kidney disease, stage IV (severe) ([place])    [name] 02/09/2014  . Chronic knee pain    bilateral  . Chronic thumb pain   . Depression   . Epicondylitis   . Gastritis, Helicobacter pylori    Noted on EGD  01/18/2009 by [name];  biopsy showed chronic gastritis with Helicobacter pylori, treated by [name].  [name] GERD (gastroesophageal reflux disease)   . Gout   . Hemorrhoids, internal 05/09/2005    Found on colonoscopy 05/09/2005 by [name]  . History of blood transfusion    "low blood count"  . Hypertension   . Osteoporosis, unspecified 03/06/2012   A DXA bone density scan done 02/20/2012 showed osteoporosis with a lumbar spine T-score of -4.4 and a right femur T-score of -2.8.  Patient has chronic kidney disease with estimated GFR less than 30, and it is not clear how much of her osteoporosis may be due to renal osteodystrophy.    . Pericardial effusion 11/2004   S/P subxiphoid pericardial window 11/01/2004 by [name].  A question of possible collagen vascular disease had been raised in 2006 due to arthralgias, a history of idiopathic pericarditis, and increased pigmentation of her palms, and she was referred to [name] but he was unable to confirm a rheumatologic diagnosis.  . Pinguecula   . Renal failure    Formerly on hemodialysis; managed by Dr. Corliss Parish  . Stiffness of joint, not elsewhere classified, hand   . Stroke [facility]) 2007   "when my kidneys went out & I was in a coma; weak LUE/LLE since"  . Thigh pain   . Vitreous detachment    "no OR"    Patient Active Problem  List   Diagnosis Date Noted  . Diverticulitis of large intestine without perforation or abscess without bleeding 05/21/2018  . Subjective tinnitus of both ears 01/24/2018  . Chest pain, non-cardiac 05/30/2017  . Benign positional vertigo 09/21/2016  . Preventative health care 09/11/2016  . Vaginal mass 07/19/2016  . Insomnia 05/03/2015  . GERD (gastroesophageal reflux disease) 05/03/2015  . Fatigue 04/20/2015  . Meningioma ([place]) 03/01/2015  . History of CVA (cerebrovascular accident) 01/24/2015  . Gout 03/03/2014  . Hearing loss 11/03/2013  . Osteoporosis 03/06/2012   . Depression 08/30/2009  . Iron deficiency anemia 06/01/2009  . Disease of pericardium 06/01/2009  . CKD (chronic kidney disease) stage 4, GFR 15-29 ml/min (HCC) 06/01/2009  . CATARACTS 10/24/2005  . Essential hypertension 10/24/2005  . ANGIODYSPLASIA, COLON 05/09/2005    Past Surgical History:  Procedure Laterality Date  . ABDOMINAL HYSTERECTOMY  1960's  . ARTERIOVENOUS GRAFT PLACEMENT Left 11/28/2005   forearm arteriovenous  . CARDIAC CATHETERIZATION  01/2005   [name] 05/15/2010  . HALLUX VALGUS CORRECTION Left 01/2012   foot  . SUBXYPHOID PERICARDIAL WINDOW  11/01/2004  . THROMBECTOMY Left  03/06/2006   Simple thrombectomy arteriovenous Gore-Tex graft, left arm, with exploration of venous end and intraoperative shuntogram.         . THROMBECTOMY / ARTERIOVENOUS GRAFT REVISION Left 01/21/2006   Thrombectomy and revision of left forearm loop AV graft.  . TUBAL LIGATION  1960's   "before hysterectomy"     OB History   No obstetric history on file.      Home Medications    Prior to Admission medications   Medication Sig Start Date End Date Taking? Authorizing Provider  allopurinol (ZYLOPRIM) 100 MG tablet Take 1.5 tablets (150 mg total) by mouth daily. 01/14/18   [name], MD  amLODipine (NORVASC) 10 MG tablet Take 1 tablet (10 mg total) by mouth at bedtime. 07/09/17   [name], MD  buPROPion (WELLBUTRIN XL) 300 MG 24 hr tablet TAKE 1 TABLET(300 MG) BY MOUTH DAILY 04/03/18   [name], MD  docusate sodium (COLACE) 100 MG capsule Take 100 mg by mouth daily.    [provider]  famotidine (PEPCID) 20 MG tablet Take 1 tablet (20 mg total) by mouth 2 (two) times daily. 01/14/18   [name], MD  furosemide (LASIX) 80 MG tablet Take 0.5 tablets (40 mg total) by mouth daily. 02/05/18   [name], MD  meclizine (ANTIVERT) 12.5 MG tablet TAKE 1 TABLET BY MOUTH TWICE DAILY AS NEEDED FOR DIZZINESS 05/07/18   [name], MD  Melatonin 5  MG CAPS Take 1 capsule (5 mg total) by mouth at bedtime. 05/21/18   Lorella Nimrod, MD  metoprolol tartrate (LOPRESSOR) 25 MG tablet Take 0.5 tablets (12.5 mg total) by mouth 2 (two) times daily. 05/21/18   Lorella Nimrod, MD  Multiple Vitamins-Minerals (MULTIVITAMIN WITH MINERALS) tablet Take 1 tablet by mouth daily. Centrum Silver    [provider]  omeprazole (PRILOSEC) 20 MG capsule Take 1 capsule (20 mg total) by mouth 2 (two) times daily. 02/05/18   [name], MD  SENSIPAR 30 MG tablet TAKE 1 TABLET BY MOUTH ONCE DAILY Patient not taking: Reported on 10/24/2016 10/02/16   [name], MD  zolpidem (AMBIEN) 5 MG tablet Take 1 tablet (5 mg total) by mouth at bedtime as needed for sleep. 01/22/18   Masoudi, Dorthula Rue, MD  FLUoxetine (PROZAC) 10 MG tablet Take 1 tablet (10 mg total) by mouth daily. 03/07/11 03/12/11  Joines,  Sonia Side, MD    Family History Family History  Problem Relation Age of Onset  . Hypertension Brother   . Heart disease Father   . Diabetes Brother   . Breast cancer Neg Hx   . Colon cancer Neg Hx   . Lung cancer Neg Hx   . Cervical cancer Neg Hx   . Sickle cell anemia Neg Hx     Social History Social History   Tobacco Use  . Smoking status: Former Smoker    Packs/day: 0.10    Years: 1.00    Pack years: 0.10    Types: Cigarettes    Last attempt to quit: 01/02/1960    Years since quitting: 58.4  . Smokeless tobacco: Never Used  . Tobacco comment: patient stated has never smoked   Substance Use Topics  . Alcohol use: No    Alcohol/week: 0.0 standard drinks  . Drug use: No     Allergies   Ibuprofen   Review of Systems Review of Systems  Gastrointestinal: Positive for abdominal pain.  All other systems reviewed and are negative.    Physical Exam Updated Vital Signs BP (!) 150/100   Pulse 65   Temp 98.7 F (37.1 C) (Oral)   Wt 70.8 kg   SpO2 100%   BMI 29.49 kg/m   Physical Exam Vitals signs and nursing note reviewed.   Constitutional:      General: She is not in acute distress.    Appearance: She is well-developed.     Comments: Elderly female who appears uncomfortable due to pain, otherwise nontoxic  HENT:     Head: Normocephalic and atraumatic.  Eyes:     Extraocular Movements: Extraocular movements intact.     Conjunctiva/sclera: Conjunctivae normal.     Pupils: Pupils are equal, round, and reactive to light.  Neck:     Musculoskeletal: Normal range of motion and neck supple.  Cardiovascular:     Rate and Rhythm: Normal rate and regular rhythm.     Pulses: Normal pulses.  Pulmonary:     Effort: Pulmonary effort is normal. No respiratory distress.     Breath sounds: Normal breath sounds. No wheezing.  Abdominal:     General: There is no distension.     Palpations: Abdomen is soft. There is no mass.     Tenderness: There is abdominal tenderness. There is no guarding or rebound.     Comments: Tenderness to palpation of right lower quadrant, suprapubic, left lower quadrant abdomen.  No CVA tenderness.   Musculoskeletal: Normal range of motion.  Skin:    General: Skin is warm and dry.     Capillary Refill: Capillary refill takes less than 2 seconds.  Neurological:     Mental Status: She is alert and oriented to person, place, and time.      ED Treatments / Results  Labs (all labs ordered are listed, but only abnormal results are displayed) Labs Reviewed  CBC WITH DIFFERENTIAL/PLATELET - Abnormal; Notable for the following components:      Result Value   WBC 3.5 (*)    Neutro Abs 1.6 (*)    All other components within normal limits  SARS CORONAVIRUS 2 (HOSPITAL ORDER, [facility])  COMPREHENSIVE METABOLIC PANEL  URINALYSIS, ROUTINE W REFLEX MICROSCOPIC    EKG None  Radiology No results found.  Procedures Procedures (including critical care time)  Medications Ordered in ED Medications  morphine 4 MG/ML injection 4 mg (4 mg Intravenous Given 05/24/18  1441)     Initial Impression / Assessment and Plan / ED Course  I have reviewed the triage vital signs and the nursing notes.  Pertinent labs & imaging results that were available during my care of the patient were reviewed by me and considered in my medical decision making (see chart for details).        Patient presenting for evaluation of acute worsening abdominal pain after being diagnosed with diverticulitis 10 days ago.  Physical exam concerning that patient appears very uncomfortable due to pain, however she does appear nontoxic.  She is afebrile and not tachycardic.  Concern for persistent diverticulitis failing outpatient treatment or potential perforated diverticulitis.  As such, will obtain labs, CT, and reassess.  Pain control with treatment.  Case discussed with attending, Dr. Jeanell Sparrow evaluated the patient.  Patient unable to tolerate contrast due to chronic elevation in creatinine, 2.3.  As such, will obtain CT without.  Labs reassuring, creatinine at baseline at 2.92.  White count slightly low at 3.5.  CT pending.  CT abdomen pelvis shows acute inflammation surrounding the pancreas, concern for acute pancreatitis.  Considering patient's age, CT findings, and severe abdominal pain upon arrival, I am concerned about pancreatitis.  Will order lipase. Pt made aware of plan. covid negative.  Patient signed out to York Grice, PA-C for follow-up.  Likely admit for pancreatitis.   Final Clinical Impressions(s) / ED Diagnoses   Final diagnoses:  None    ED Discharge Orders    None       Franchot Heidelberg, PA-C 05/24/18 1633    Pattricia Boss, MD 05/27/18 1050

## 2018-05-24 NOTE — ED Provider Notes (Signed)
Care assumed from [facility].  Please see her full H&P.  In short,  [name] is a 84 y.o. female with a PMH of HTN, CKD, CVA, and Diverticulitis presents for generalized abdominal pain onset 10 days ago. Patient states pain has worsened over the last 2 days. Patient was seen on 05/15 and diagnosed with diverticulitis. Patient was prescribed antibiotics. Patient denies nausea, vomiting, or diarrhea.   CT reveals acute pancreatitis. Admission pending.  Patient has been given IVF and pain medicine while in the ER.   Patient will need admission for acute pancreatitis. Consulted internal medicine team for admission. Internal medicine has agreed to admit patient.     Darlin Drop [place], [place] 05/24/18 1646    Duffy Bruce, MD 05/26/18 1023

## 2018-05-24 NOTE — ED Notes (Signed)
Vital signs stable. 

## 2018-05-24 NOTE — ED Notes (Signed)
Patient transported to CT 

## 2018-05-24 NOTE — Plan of Care (Signed)
  Problem: Education: Goal: Knowledge of General Education information will improve Description Including pain rating scale, medication(s)/side effects and non-pharmacologic comfort measures Outcome: Progressing   Problem: Clinical Measurements: Goal: Ability to maintain clinical measurements within normal limits will improve Outcome: Progressing Goal: Will remain free from infection Outcome: Progressing Goal: Diagnostic test results will improve Outcome: Progressing Goal: Cardiovascular complication will be avoided Outcome: Progressing   Problem: Activity: Goal: Risk for activity intolerance will decrease Outcome: Progressing   Problem: Nutrition: Goal: Adequate nutrition will be maintained Outcome: Progressing   Problem: Elimination: Goal: Will not experience complications related to bowel motility Outcome: Progressing   Problem: Pain Managment: Goal: General experience of comfort will improve Outcome: Progressing   Problem: Safety: Goal: Ability to remain free from injury will improve Outcome: Progressing

## 2018-05-24 NOTE — ED Notes (Signed)
EDP at bedside  

## 2018-05-24 NOTE — Progress Notes (Signed)
1740 Received pt from ED, A&O x4. Abd is soft, non-tender, not distended. Denies pain at this time.

## 2018-05-24 NOTE — ED Notes (Signed)
Pt is updating family herself and has phone

## 2018-05-24 NOTE — H&P (Addendum)
Date: 05/24/2018               Patient Name:  [name] MRN: [id number]  DOB: [birth year] Age / Sex: 84 y.o., female   PCP: [name], MD         Medical Service: Internal Medicine Teaching Service         Attending Physician: Dr. Daryll Drown    First Contact: Dr. Eileen Stanford Pager: [phone]  Second Contact: Dr. Shan Levans Pager: [phone]       After Hours (After 5p/  First Contact Pager: [phone]  weekends / holidays): Second Contact Pager: [phone]   Chief Complaint: Abdominal Pain   History of Present Illness: [name] is an 84 year old very pleasant woman with hypertension, CKD stage IV, CVA, diverticulitis, osteoporosis presenting with sided abdominal pain for 10 days however has worsened in the last 2 days.  She was recently diagnosed with acute diverticulitis on May 15 when she presented to the ED and was discharged with Augmentin.  For the past 2 days she has experiencing worsening intermittent dull right-sided abdominal pain with radiation to the back.  She denies any aggravating or alleviating factors.  She denies nausea, vomiting, melena, hematochezia, history of gallstones, changes with medication, taking any new over-the-counter medications, scorpion bites.  The pain is not postprandial in nature.  This morning, the pain got very severe but she was able to have breakfast without experiencing nausea or vomiting.  ED course: Afebrile, BP 150s/90s, HR 50s-60s, SPO2 99% on room air.  CBC without leukocytosis, CMP shows elevated BUN/creatinine 49/2.92, lipase pending.  CT abdomen and pelvis showed New ill-defined enlargement of the pancreas with surrounding inflammatory changes and pancreatic ductal dilatation concerning for an acute pancreatitis.  No evidence of gallstones or biliary duct dilation.  She received IV morphine, normal saline bolus maintenance IVF.  Meds:  No outpatient medications have been marked as taking for the 05/24/18 encounter [facility] Encounter).    No current facility-administered medications on file prior to encounter.    Current Outpatient Medications on File Prior to Encounter  Medication Sig Dispense Refill  . allopurinol (ZYLOPRIM) 100 MG tablet Take 1.5 tablets (150 mg total) by mouth daily. 135 tablet 1  . amLODipine (NORVASC) 10 MG tablet Take 1 tablet (10 mg total) by mouth at bedtime. 90 tablet 3  . buPROPion (WELLBUTRIN XL) 300 MG 24 hr tablet TAKE 1 TABLET(300 MG) BY MOUTH DAILY 90 tablet 1  . docusate sodium (COLACE) 100 MG capsule Take 100 mg by mouth daily.    . famotidine (PEPCID) 20 MG tablet Take 1 tablet (20 mg total) by mouth 2 (two) times daily. 180 tablet 1  . furosemide (LASIX) 80 MG tablet Take 0.5 tablets (40 mg total) by mouth daily. 30 tablet 2  . meclizine (ANTIVERT) 12.5 MG tablet TAKE 1 TABLET BY MOUTH TWICE DAILY AS NEEDED FOR DIZZINESS 30 tablet 0  . Melatonin 5 MG CAPS Take 1 capsule (5 mg total) by mouth at bedtime. 30 capsule 2  . metoprolol tartrate (LOPRESSOR) 25 MG tablet Take 0.5 tablets (12.5 mg total) by mouth 2 (two) times daily. 90 tablet 3  . Multiple Vitamins-Minerals (MULTIVITAMIN WITH MINERALS) tablet Take 1 tablet by mouth daily. Centrum Silver    . omeprazole (PRILOSEC) 20 MG capsule Take 1 capsule (20 mg total) by mouth 2 (two) times daily. 180 capsule 1  . SENSIPAR 30 MG tablet TAKE 1 TABLET BY MOUTH ONCE DAILY (Patient not taking: Reported on  10/24/2016) 90 tablet 0  . zolpidem (AMBIEN) 5 MG tablet Take 1 tablet (5 mg total) by mouth at bedtime as needed for sleep. 10 tablet 0  . [DISCONTINUED] FLUoxetine (PROZAC) 10 MG tablet Take 1 tablet (10 mg total) by mouth daily. 30 tablet 2     Allergies: Allergies as of 05/24/2018 - Review Complete 05/24/2018  Allergen Reaction Noted  . Ibuprofen Other (See Comments) 01/17/2014   Past Medical History:  Diagnosis Date  . Anemia due to blood loss, chronic   . Anemia, iron deficiency   . Angiodysplasia of colon 05/09/2005   Single small  angiodysplastic lesion in the descending colon found on colonoscopy 05/09/2005 by [name]  . Arthritis    "knots on my thumbs; left elbow" (03/03/2014)  . Cataracts, bilateral   . Chronic ankle pain    bilateral  . Chronic kidney disease, stage IV (severe) ([place])    [name] 02/09/2014  . Chronic knee pain    bilateral  . Chronic thumb pain   . Depression   . Epicondylitis   . Gastritis, Helicobacter pylori    Noted on EGD 01/18/2009 by [name];  biopsy showed chronic gastritis with Helicobacter pylori, treated by [name].  [name] GERD (gastroesophageal reflux disease)   . Gout   . Hemorrhoids, internal 05/09/2005    Found on colonoscopy 05/09/2005 by [name]  . History of blood transfusion    "low blood count"  . Hypertension   . Osteoporosis, unspecified 03/06/2012   A DXA bone density scan done 02/20/2012 showed osteoporosis with a lumbar spine T-score of -4.4 and a right femur T-score of -2.8.  Patient has chronic kidney disease with estimated GFR less than 30, and it is not clear how much of her osteoporosis may be due to renal osteodystrophy.    . Pericardial effusion 11/2004   S/P subxiphoid pericardial window 11/01/2004 by [name].  A question of possible collagen vascular disease had been raised in 2006 due to arthralgias, a history of idiopathic pericarditis, and increased pigmentation of her palms, and she was referred to [name] but he was unable to confirm a rheumatologic diagnosis.  . Pinguecula   . Renal failure    Formerly on hemodialysis; managed by Dr. Corliss Parish  . Stiffness of joint, not elsewhere classified, hand   . Stroke [facility]) 2007   "when my kidneys went out & I was in a coma; weak LUE/LLE since"  . Thigh pain   . Vitreous detachment    "no OR"    Family History: Denies family history of pancreatitis otherwise noncontributory  Social History: Lives at home with his sons and daughter-in-law.  Has great support  at home, independent of ADLs.  Denies EtOH use, cigarette use or illicit drug use.  Review of Systems: A complete ROS was negative except as per HPI.   Physical Exam: Blood pressure 132/89, pulse (!) 55, temperature 98.7 F (37.1 C), temperature source Oral, weight 70.8 kg, SpO2 100 %. Physical Exam Vitals signs and nursing note reviewed.  Constitutional:      General: Distressed: Moderate distress due to abdominal pain.     Appearance: She is not ill-appearing, toxic-appearing or diaphoretic.  HENT:     Head: Normocephalic and atraumatic.     Mouth/Throat:     Mouth: Mucous membranes are moist.     Pharynx: Oropharynx is clear.  Cardiovascular:     Rate and Rhythm: Normal rate and regular rhythm.  Heart sounds: Normal heart sounds. No murmur. No friction rub. No gallop.   Pulmonary:     Effort: Pulmonary effort is normal. No respiratory distress.     Breath sounds: Normal breath sounds. No wheezing, rhonchi or rales.  Abdominal:     General: Abdomen is flat. Bowel sounds are normal. There is no distension or abdominal bruit. There are no signs of injury.     Palpations: Abdomen is soft. There is no hepatomegaly or pulsatile mass.     Tenderness: There is abdominal tenderness in the right upper quadrant, right lower quadrant, epigastric area and periumbilical area. There is no guarding or rebound. Negative signs include Murphy's sign.     Hernia: No hernia is present.  Neurological:     Mental Status: She is alert.  Psychiatric:        Mood and Affect: Mood normal.        Behavior: Behavior normal.    EKG: personally reviewed my interpretation is sinus rhythm  Assessment & Plan by Problem: Principal Problem:   Acute pancreatitis  Ms. Angi Goodell is an 84 year old very pleasant woman with hypertension, CKD stage IV, CVA, diverticulitis, osteoporosis presenting with acute intermittent right-sided abdominal pain with radiation to the back found to have acute pancreatitis  on CT abdomen and pelvis.  #Abdominal pain secondary to acute pancreatitis: 10-day history of right-sided abdominal pain with radiation to the back worsening over the past 2 days with no associated nausea, vomiting.  CT abdomen and pelvis suggestive of acute pancreatitis (new from CT abdomen on 5/15).  Denies EtOH, history of gallstones which was also not found on CT abdomen and pelvis.  Other possible etiologies of pancreatitis includes hypercalcemia, hypertriglyceridemia, medications such as diuretics (she takes Lasix).  She denies any changes with medications recently or consumption of over-the-counter medication.  She has not been bitten by a scorpion either nor has she undergone an ERCP. Unable to calculate Ranson's or [place] II Score due to limited labs.  -Continue to monitor, if pain worsens or does not improve within 48 hours we will repeat CT abdomen and pelvis. -N.p.o. -Gentle maintenance IV fluids with NS at 75 mL/hr -Pain regimen: IV Dilaudid 1 mg q6hrs prn -Follow-up lipid panel  #Hypertension: Continue amlodipine 10 mg daily -Home antihypertensives include amlodipine 10 mg daily, Lopressor 12.5mg  BID, Lasix 40mg  Daily   #CKD stage IV: sCr 2.9 (baseline 1.8-3.6).  She is making urine -Follow-up a.m. BMP -Monitor urine output  #Recent dx of diverticulitis: Completed 7-day course of Augmentin.  #Insomnia: Continue Ambien  #Depression: Continue bupropion  FEN: N.p.o., place electrolytes as needed VTE ppx: Subcutaneous Lovenox CODE STATUS: Full code  Dispo: Admit patient to Inpatient with expected length of stay greater than 2 midnights.  Signed: Jean Rosenthal, MD 05/24/2018, 5:22 PM  Pager: [phone] IMT[facility] PGY-1

## 2018-05-24 NOTE — ED Notes (Signed)
Pt ambulatory to bathroom

## 2018-05-24 NOTE — Discharge Summary (Signed)
Name: [name] MRN: [id number] DOB: 1934/08/01 84 y.o. PCP: [name], MD  Date of Admission: 05/24/2018  1:29 PM Date of Discharge: 05/26/2018 Attending Physician: Dr. Daryll Drown  Discharge Diagnosis: 1.  Acute pancreatitis, unclear etiology 2.  Hypertension 3.  Acute on chronic CKD stage IV 4.  Insomnia 5.  Depression  Discharge Medications: Allergies as of 05/26/2018      Reactions   Ibuprofen Other (See Comments)   PT limited intake because of chronic kidney DX      Medication List    TAKE these medications   allopurinol 100 MG tablet Commonly known as:  ZYLOPRIM Take 1.5 tablets (150 mg total) by mouth daily.   amLODipine 10 MG tablet Commonly known as:  NORVASC Take 1 tablet (10 mg total) by mouth at bedtime.   buPROPion 300 MG 24 hr tablet Commonly known as:  WELLBUTRIN XL TAKE 1 TABLET(300 MG) BY MOUTH DAILY What changed:  See the new instructions.   docusate sodium 100 MG capsule Commonly known as:  COLACE Take 100 mg by mouth daily.   famotidine 20 MG tablet Commonly known as:  PEPCID Take 1 tablet (20 mg total) by mouth 2 (two) times daily.   furosemide 80 MG tablet Commonly known as:  LASIX Take 0.5 tablets (40 mg total) by mouth daily.   meclizine 12.5 MG tablet Commonly known as:  ANTIVERT TAKE 1 TABLET BY MOUTH TWICE DAILY AS NEEDED FOR DIZZINESS What changed:    how much to take  when to take this  reasons to take this  additional instructions   Melatonin 5 MG Caps Take 1 capsule (5 mg total) by mouth at bedtime.   metoprolol tartrate 25 MG tablet Commonly known as:  LOPRESSOR Take 0.5 tablets (12.5 mg total) by mouth 2 (two) times daily.   multivitamin with minerals tablet Take 1 tablet by mouth daily. Centrum Silver   omeprazole 20 MG capsule Commonly known as:  PRILOSEC Take 1 capsule (20 mg total) by mouth 2 (two) times daily.   Sensipar 30 MG tablet Generic drug:  cinacalcet TAKE 1 TABLET BY MOUTH ONCE DAILY  What changed:    how much to take  when to take this   zolpidem 5 MG tablet Commonly known as:  AMBIEN Take 1 tablet (5 mg total) by mouth at bedtime as needed for sleep.       Disposition and follow-up:   [name] was discharged from [facility] in [place] condition.  At the hospital follow up visit please address:  1.  Acute pancreatitis, unclear etiology: Monitor for symptoms recurrence.  2.  Labs / imaging needed at time of follow-up: BMP  3.  Pending labs/ test needing follow-up: None  Follow-up Appointments:   Hospital Course by problem list: 1.  Acute pancreatitis, unclear etiology:  [name] is an 84 year old very pleasant woman with hypertension, CKD stage IV, CVA, diverticulitis, osteoporosis presenting with sided abdominal pain for 10 days however has worsened in the last 2 days.  She was recently diagnosed with acute diverticulitis on May 15 when she presented to the ED and was discharged with Augmentin.  2 days prior to admission, she had been experiencing worsening intermittent dull right-sided abdominal pain with radiation to the back.  He did not report any aggravating or elevating factors.  She denied nausea, vomiting, melena, hematochezia, history of gallstones, changes with medication or consuming any new medications with exception of Augmentin.  The pain  was not postprandial in nature and she was actually able to eat breakfast the morning prior to admission.  On arrival to the ED, she was afebrile, BP 150s/90s, HR 50s-60s, SPO2 99% on room air.  CBC was without without leukocytosis, CMP shows elevated BUN/creatinine 49/2.92, lipase was unremarkable, lipid panel did not reveal hypertriglyceridemia,  CT abdomen and pelvis showed New ill-defined enlargement of the pancreas with surrounding inflammatory changes and pancreatic ductal dilatation concerning for an acute pancreatitis, no evidence of gallstones or biliary duct dilation.  In the  ED, She received IV morphine, normal saline bolus maintenance IVF.  She was made n.p.o., received IV Dilaudid for pain control.  On hospital day 1, she reported resolution of abdominal pain and was started on a clear liquid diet which was transitioned to a soft diet.  She tolerated her diet very well and was subsequently discharged.  It is unclear the etiology of her acute pancreatitis, she has had chronic mildly elevated lipase.  The only new medication she was started on was Augmentin and there has been a couple case reports of Augmentin-induced acute pancreatitis.  If pain reoccurs, will consider either MRCP, EUS, rule out autoimmune pancreatitis.   2.  Hypertension: Blood pressure remained stable and we only continued amlodipine 10 mg daily.  3.  CKD stage IV:  Renal function remained stable.  Discharge Vitals:   BP (!) 144/84 (BP Location: Right Arm)   Pulse 75   Temp 98.3 F (36.8 C) (Oral)   Resp 18   Ht 5' (1.524 m)   Wt 70.8 kg   SpO2 99%   BMI 30.48 kg/m   Pertinent Labs, Studies, and Procedures:  BMP Latest Ref Rng & Units 05/26/2018 05/25/2018 05/24/2018  Glucose 70 - 99 mg/dL 104(H) 104(H) 99  BUN 8 - 23 mg/dL 30(H) 42(H) 49(H)  Creatinine 0.44 - 1.00 mg/dL 2.11(H) 2.43(H) 2.92(H)  BUN/Creat Ratio 12 - 28 - - -  Sodium 135 - 145 mmol/L 140 142 138  Potassium 3.5 - 5.1 mmol/L 4.2 4.6 4.9  Chloride 98 - 111 mmol/L 106 107 101  CO2 22 - 32 mmol/L 27 22 25   Calcium 8.9 - 10.3 mg/dL 9.1 9.2 9.2    CT ABDOMEN AND PELVIS WITHOUT CONTRAST  TECHNIQUE: Multidetector CT imaging of the abdomen and pelvis was performed following the standard protocol without IV contrast.  COMPARISON:  Prior CTs 05/16/2018 and 09/11/2016.  FINDINGS: Lower chest: New trace bilateral pleural effusions. There is mild underlying emphysema.  Hepatobiliary: No hepatic abnormalities are demonstrated on noncontrast imaging. No evidence of gallstones, gallbladder wall thickening or biliary  dilatation.  Pancreas: The pancreas is ill-defined with new surrounding inflammatory changes. The main pancreatic duct is moderately dilated to 9 mm. No parenchymal calcification or focal fluid collection identified.  Spleen: Normal in size without focal abnormality.  Adrenals/Urinary Tract: Both adrenal glands appear normal. Stable nonobstructing calculus in the lower pole of the left kidney. Both kidneys demonstrate cortical thinning. There is no evidence ureteral calculus or hydronephrosis. Small exophytic lesion involving the upper pole of the left kidney is again noted, likely a cyst. The bladder appears unremarkable.  Stomach/Bowel: No evidence of bowel wall thickening, distention or surrounding inflammatory change. Distal colonic diverticular changes are again noted without residual pericolonic inflammatory change.  Vascular/Lymphatic: There are no enlarged abdominal or pelvic lymph nodes. There is new soft tissue stranding throughout the retroperitoneal fat, extending into the retrocrural areas bilaterally. There is aortic and branch vessel atherosclerosis. Multiple right-sided  gonadal vein phleboliths are present.  Reproductive: Hysterectomy. No adnexal mass. There is a vaginal pessary.  Other: Trace ascites without focal extraluminal fluid collection or free air.  Musculoskeletal: No acute or significant osseous findings. Stable convex left lumbar scoliosis and degenerative anterolisthesis at L4-5.  IMPRESSION: 1. New ill-defined enlargement of the pancreas with surrounding inflammatory changes and pancreatic ductal dilatation. Findings are most consistent with acute pancreatitis, especially given the elevated serum lipase level last week. 2. No evidence of gallstones or biliary dilatation. 3. Distal colonic diverticulosis without residual surrounding inflammation. 4. Nonobstructing left renal calculus. 5.  Aortic Atherosclerosis (ICD10-I70.0).   Discharge Instructions: Discharge Instructions    Call MD for:  difficulty breathing, headache or visual disturbances   Complete by:  As directed    Call MD for:  persistant dizziness or light-headedness   Complete by:  As directed    Call MD for:  persistant nausea and vomiting   Complete by:  As directed    Call MD for:  severe uncontrolled pain   Complete by:  As directed    Diet - low sodium heart healthy   Complete by:  As directed    Discharge instructions   Complete by:  As directed    Ms. Dicarlo,  It was a pleasure taking care of you here at the hospital.  You were admitted because of abdominal pain that was due to acute pancreatitis.  Currently we are not sure why this could have happened but we think it could have been related to the Augmentin.  I am glad that your symptoms improved and you ate without any other complaints.  Regarding the left arm pain, please continue with the ice packs and warm towels.  Take care and happy Memorial Day! Dr. Eileen Stanford   Increase activity slowly   Complete by:  As directed       Signed: Jean Rosenthal, MD 05/27/2018, 6:08 AM   Pager: [phone] IMTS PGY-1

## 2018-05-25 DIAGNOSIS — G4700 Insomnia, unspecified: Secondary | ICD-10-CM

## 2018-05-25 DIAGNOSIS — K859 Acute pancreatitis without necrosis or infection, unspecified: Principal | ICD-10-CM

## 2018-05-25 DIAGNOSIS — N184 Chronic kidney disease, stage 4 (severe): Secondary | ICD-10-CM

## 2018-05-25 DIAGNOSIS — F329 Major depressive disorder, single episode, unspecified: Secondary | ICD-10-CM

## 2018-05-25 DIAGNOSIS — Z79899 Other long term (current) drug therapy: Secondary | ICD-10-CM

## 2018-05-25 DIAGNOSIS — M810 Age-related osteoporosis without current pathological fracture: Secondary | ICD-10-CM

## 2018-05-25 DIAGNOSIS — Z8673 Personal history of transient ischemic attack (TIA), and cerebral infarction without residual deficits: Secondary | ICD-10-CM

## 2018-05-25 DIAGNOSIS — I129 Hypertensive chronic kidney disease with stage 1 through stage 4 chronic kidney disease, or unspecified chronic kidney disease: Secondary | ICD-10-CM

## 2018-05-25 DIAGNOSIS — Z8719 Personal history of other diseases of the digestive system: Secondary | ICD-10-CM

## 2018-05-25 DIAGNOSIS — Z87891 Personal history of nicotine dependence: Secondary | ICD-10-CM

## 2018-05-25 LAB — CBC
HCT: 38.1 % (ref 36.0–46.0)
Hemoglobin: 12.1 g/dL (ref 12.0–15.0)
MCH: 30.7 pg (ref 26.0–34.0)
MCHC: 31.8 g/dL (ref 30.0–36.0)
MCV: 96.7 fL (ref 80.0–100.0)
Platelets: 207 10*3/uL (ref 150–400)
RBC: 3.94 MIL/uL (ref 3.87–5.11)
RDW: 13.9 % (ref 11.5–15.5)
WBC: 5.4 10*3/uL (ref 4.0–10.5)
nRBC: 0 % (ref 0.0–0.2)

## 2018-05-25 LAB — COMPREHENSIVE METABOLIC PANEL
ALT: 17 U/L (ref 0–44)
AST: 20 U/L (ref 15–41)
Albumin: 3.2 g/dL — ABNORMAL LOW (ref 3.5–5.0)
Alkaline Phosphatase: 88 U/L (ref 38–126)
Anion gap: 13 (ref 5–15)
BUN: 42 mg/dL — ABNORMAL HIGH (ref 8–23)
CO2: 22 mmol/L (ref 22–32)
Calcium: 9.2 mg/dL (ref 8.9–10.3)
Chloride: 107 mmol/L (ref 98–111)
Creatinine, Ser: 2.43 mg/dL — ABNORMAL HIGH (ref 0.44–1.00)
GFR calc Af Amer: 21 mL/min — ABNORMAL LOW (ref >60)
GFR calc non Af Amer: 18 mL/min — ABNORMAL LOW (ref >60)
Glucose, Bld: 104 mg/dL — ABNORMAL HIGH (ref 70–99)
Potassium: 4.6 mmol/L (ref 3.5–5.1)
Sodium: 142 mmol/L (ref 135–145)
Total Bilirubin: 0.7 mg/dL (ref 0.3–1.2)
Total Protein: 6.3 g/dL — ABNORMAL LOW (ref 6.5–8.1)

## 2018-05-25 MED ORDER — ORAL CARE MOUTH RINSE
15.0000 mL | Freq: Two times a day (BID) | OROMUCOSAL | Status: DC
  Administered 2018-05-25 – 2018-05-26 (×3): 15 mL via OROMUCOSAL

## 2018-05-25 MED ORDER — ONDANSETRON HCL 4 MG/2ML IJ SOLN: 4.0000 mg | Freq: Three times a day (TID) | INTRAMUSCULAR | Status: DC | PRN

## 2018-05-25 NOTE — Progress Notes (Addendum)
   Subjective: HD#1   Overnight: No acute events reported   Today, [name]  Was examined at bedside and was very pleasant. She reports complete resolution of abdominal pain and denies nausea or vomiting. She only required 1mg  of IV dilaudid at 5 pm yesterday. I explained to her possible etiologies of of her acute pancreatitis as well as management plan and she expressed understanding.   Objective:  Vital signs in last 24 hours: Vitals:   05/24/18 2207 05/25/18 0344 05/25/18 0414 05/25/18 0517  BP: (!) 155/76  (!) 165/95 140/80  Pulse:   83   Resp:   16   Temp:   98.8 F (37.1 C)   TempSrc:   Oral   SpO2:   92%   Weight:      Height:  5' (1.524 m)     Const:In NAD, lying in bed comfortably  HEENT: AT/[place] Resp: CTABL, no wheezes, crackles, rhonchi  CV: RRR, no MGR Abd: BS+, mildly tender to palpation at the RUQ and epigastrium  Ext: No edema  Assessment/Plan:  Principal Problem:   Acute pancreatitis  [name] is an 84 year old very pleasant woman with hypertension, CKD stage IV, CVA, diverticulitis, osteoporosis presenting with acute intermittent right-sided abdominal pain with radiation to the back found to have acute pancreatitis on CT abdomen and pelvis.  #Acute pancreatitis likely 2/2 ?Augmentin: Today she reports complete resolution of abdominal pain.  On physical exams she is only mildly tender to palpation at the right upper quadrant and epigastrium.  Denies subsequent nausea and vomiting.  Her presentation is very unusual given no possible etiology.  Per chart review she has had mildly elevated lipase since 2013 with peaked level at 69 however lipase was unremarkable yesterday.  The only possible explanation now is Augmentin induced which seem to be the only new medication that was started in the past week.  There have been only few case reports regarding Augmentin induced acute pancreatitis. Her LFTs are unremarkable.  -Start clear liquids today -If pain  worsens or does not improve within 48 hours we will repeat CT abdomen and pelvis. -Gentle maintenance IV fluids with NS at 50 mL/hr -Pain regimen: IV Dilaudid 1 mg q6hrs prn  #Hypertension: BP Stable. Continue amlodipine 10 mg daily -Home antihypertensives include amlodipine 10 mg daily, Lopressor 12.5mg  BID, Lasix 40mg  Daily   #AoCKD stage IV:  She reports that she is making urine. SCr 2.3<2.9 (baseline 1.8-3.6).  She is making urine -Follow-up a.m. BMP -Monitor urine output  #Recent dx of diverticulitis: Completed 7-day course of Augmentin.  #Insomnia: Continue Ambien  #Depression: Continue bupropion  Dispo: Anticipated discharge in approximately 1 day(s).   Nichole Rosenthal, MD 05/25/2018, 6:03 AM Pager: [phone] IMTS PGY-1

## 2018-05-25 NOTE — Progress Notes (Signed)
  Date: 05/25/2018  Patient name: [name]  Medical record number: [id number]  Date of birth: May 20, 1934   I have seen and evaluated [name] and discussed their care with the Residency Team. Briefly, [name] is an 84 year old woman with PMH Of HTN, CKD stage 4, CVA, diverticulitis (treated with Augmentin), osteoporosis who presented with right sided abdominal pain for 10 days and worsened right to midline pain which radiates to the back for 2 days.  She presented to the ED on 05/16/18 and was treated with Augmentin.  The worsening pain 2 days ago was different from her previous pain.  CT scan on 05/16/18 showed acute diverticulitis.  CT scan yesterday showed improved diverticulitis and now pancreatitis.    PMHx, Fam Hx, and/or Soc Hx : She does note drink ETOH.  She is a former smoker.    Vitals:   05/25/18 0414 05/25/18 0517  BP: (!) 165/95 140/80  Pulse: 83   Resp: 16   Temp: 98.8 F (37.1 C)   SpO2: 92%    General: Lying in bed, no distress Eyes: Anicteric, no icterus HENT: neck is supple, MMM CV: RR, NR, no murmur Pulm: CTAB, no wheezing, no crackles Abd: + mild TTP in the epigastrium and RUQ, soft, +BS Ext: Thin, no edema Skin: No rash or wound on exposed skin  Lipase 05/16/18  57  Lipase 05/24/18  50 Interestingly, Lipase has appeared to be mildly elevated since 05/30/17   Assessment and Plan: I have seen and evaluated the patient as outlined above. I agree with the formulated Assessment and Plan as detailed in the residents' note, with the following changes:   1. Acute pancreatitis, unknown cause - TG 84, no sign on imaging or labs of gallbladder disease (possibly a passed stone?), no ETOH use.  These are the three most common causes.  - She did have development of pancreatitis while on Augmentin, there are case reports of this drug causing pancreatitis, but it appears that these cases had the normal elevation in lipase as expected, and which is not the case in  our patient.  Renal failure (chronic in our patient) can cause it as well - She is improved this morning, advance to clears - Cautious use of fluids given renal disease - She has not required any pain medication since 5pm yesterday evening - No nausea, no vomiting - Monitor on the initiation of food.   Other issues per [name] note.   Nichole Falcon, MD 5/24/20208:02 AM

## 2018-05-26 DIAGNOSIS — Z886 Allergy status to analgesic agent status: Secondary | ICD-10-CM

## 2018-05-26 DIAGNOSIS — N179 Acute kidney failure, unspecified: Secondary | ICD-10-CM

## 2018-05-26 LAB — CBC
HCT: 35.4 % — ABNORMAL LOW (ref 36.0–46.0)
Hemoglobin: 11.7 g/dL — ABNORMAL LOW (ref 12.0–15.0)
MCH: 31.6 pg (ref 26.0–34.0)
MCHC: 33.1 g/dL (ref 30.0–36.0)
MCV: 95.7 fL (ref 80.0–100.0)
Platelets: 183 10*3/uL (ref 150–400)
RBC: 3.7 MIL/uL — ABNORMAL LOW (ref 3.87–5.11)
RDW: 13.9 % (ref 11.5–15.5)
WBC: 4.8 10*3/uL (ref 4.0–10.5)
nRBC: 0 % (ref 0.0–0.2)

## 2018-05-26 LAB — BASIC METABOLIC PANEL
Anion gap: 7 (ref 5–15)
BUN: 30 mg/dL — ABNORMAL HIGH (ref 8–23)
CO2: 27 mmol/L (ref 22–32)
Calcium: 9.1 mg/dL (ref 8.9–10.3)
Chloride: 106 mmol/L (ref 98–111)
Creatinine, Ser: 2.11 mg/dL — ABNORMAL HIGH (ref 0.44–1.00)
GFR calc Af Amer: 25 mL/min — ABNORMAL LOW (ref >60)
GFR calc non Af Amer: 21 mL/min — ABNORMAL LOW (ref >60)
Glucose, Bld: 104 mg/dL — ABNORMAL HIGH (ref 70–99)
Potassium: 4.2 mmol/L (ref 3.5–5.1)
Sodium: 140 mmol/L (ref 135–145)

## 2018-05-26 NOTE — Progress Notes (Signed)
AVS given and reviewed with pt. Medications reviewed and discussed. All questions answered to satisfaction. Pt verbalized understanding of information given. Pt escorted off the unit via wheelchair by staff member.

## 2018-05-26 NOTE — Progress Notes (Signed)
   Subjective: HD#2   Overnight:   Today, [name] reports she is doing really well and denies abdominal pain, nausea or vomiting.  Her only complaint is right arm pain which seems to be related to a previous IV infiltration.  The pain is somewhat relieved with ice packs and Tylenol.  She is willing to be discharged after eating breakfast.  Objective:  Vital signs in last 24 hours: Vitals:   05/25/18 0517 05/25/18 1440 05/25/18 1948 05/26/18 0510  BP: 140/80 136/86 (!) 152/73 (!) 145/92  Pulse:  84 73 75  Resp:  16 (!) 22 18  Temp:  98.3 F (36.8 C) 98.7 F (37.1 C) 97.9 F (36.6 C)  TempSrc:  Oral Oral Oral  SpO2:  94% 97% 97%  Weight:      Height:       Const: No acute distress, lying comfortably in bed HEENT: Atraumatic, normocephalic, no scleral icterus Abd: Bowel sounds present, nontender to palpation.   Assessment/Plan:  Principal Problem:   Acute pancreatitis  [name] is an 84 year old very pleasant woman with hypertension, CKD stage IV, CVA, diverticulitis, osteoporosis presenting with acute intermittent right-sided abdominal pain with radiation to the back found to have acute pancreatitis on CT abdomen and pelvis.  #Acute pancreatitis unknown etiology: , Working Link Snuffer is Augmentin induced acute pancreatitis however we cannot be sure without ruling out other etiologies such as sphincter of Oddi dysfunction, autoimmune, microlithiasis.  Symptoms have resolved and she tolerated clear liquid diet yesterday.  We are transitioning to soft diet today and will most likely discharge this a.m and follow-up with PCP.  -Transition to soft diet today -Pain regimen: IV Dilaudid 1 mgq6hrs prn  #Hypertension: BP Stable.  -Continue amlodipine 10 mg daily -Home antihypertensives include amlodipine 10 mg daily, Lopressor 12.5mg  BID,Lasix40mg  Daily  #AoCKD stage IV-stable: sCr 2.1<2.3<2.9 (baseline 1.8-3.6).  -Follow-up a.m. BMP -Monitor urine output   #Recentdxof diverticulitis: Completed 7-day course of Augmentin.  #Insomnia: Continue Ambien  #Depression: Continue bupropion  Dispo: Anticipated discharge today.  Nichole Rosenthal, MD 05/26/2018, 6:30 AM Pager: [phone] IMTS PGY-1

## 2018-05-26 NOTE — Progress Notes (Signed)
Patient complains of painful right antecubital sight.   Patient reports that an IV was placed and labs were drawn in the emergency department on Saturday upon her admission at the aforementioned IV sight. Patient reports that the she requested the IV to be removed upon her admission to the floor related to pain at the IV sight.   Patient reports that the pain continued to build on Sunday (dayshift) but she thought it would go away.   On this morning patient complains of pain at the same site. The arm is not swollen. The current IV is infusing. RN offers to remove the IV for comfort and place a new IV sight. Patient refuses. Charge RN called to the bedside to assess patient as well. Ice packs placed on the right antecubital. Nursing will continue to monitor.

## 2018-05-26 NOTE — Plan of Care (Signed)
Problem: Education: Goal: Knowledge of General Education information will improve Description Including pain rating scale, medication(s)/side effects and non-pharmacologic comfort measures Outcome: Progressing   Problem: Clinical Measurements: Goal: Ability to maintain clinical measurements within normal limits will improve Outcome: Progressing Goal: Respiratory complications will improve Outcome: Progressing Goal: Cardiovascular complication will be avoided Outcome: Progressing   Problem: Activity: Goal: Risk for activity intolerance will decrease Outcome: Progressing   Problem: Nutrition: Goal: Adequate nutrition will be maintained Outcome: Progressing   Problem: Safety: Goal: Ability to remain free from injury will improve Outcome: Progressing

## 2018-05-26 NOTE — Progress Notes (Signed)
  Date: 05/26/2018  Patient name: [name]  Medical record number: [id number]  Date of birth: [birth year]   I have seen and evaluated this patient and I have discussed the plan of care with the house staff. Please see Dr. Nelia Shi note for complete details. I concur with his findings and plan.   She is improved today, will likely discharge home if she does well with lunch.   Sid Falcon, MD 05/26/2018, 8:33 PM

## 2018-06-02 ENCOUNTER — Ambulatory Visit (INDEPENDENT_AMBULATORY_CARE_PROVIDER_SITE_OTHER): Payer: Medicare Other | Admitting: Internal Medicine

## 2018-06-02 ENCOUNTER — Other Ambulatory Visit: Payer: Self-pay

## 2018-06-02 DIAGNOSIS — N184 Chronic kidney disease, stage 4 (severe): Secondary | ICD-10-CM

## 2018-06-02 DIAGNOSIS — I129 Hypertensive chronic kidney disease with stage 1 through stage 4 chronic kidney disease, or unspecified chronic kidney disease: Secondary | ICD-10-CM | POA: Diagnosis not present

## 2018-06-02 DIAGNOSIS — Z79899 Other long term (current) drug therapy: Secondary | ICD-10-CM

## 2018-06-02 DIAGNOSIS — G4700 Insomnia, unspecified: Secondary | ICD-10-CM

## 2018-06-02 DIAGNOSIS — K859 Acute pancreatitis without necrosis or infection, unspecified: Secondary | ICD-10-CM | POA: Diagnosis not present

## 2018-06-02 DIAGNOSIS — K853 Drug induced acute pancreatitis without necrosis or infection: Secondary | ICD-10-CM

## 2018-06-02 DIAGNOSIS — I10 Essential (primary) hypertension: Secondary | ICD-10-CM

## 2018-06-02 NOTE — Progress Notes (Signed)
   CC: hospital f/u , acute pancreatitis   HPI:  [name] is a 84 y.o. with CKD4, insomnia, HTN, and recent hospital admission for acute pancreatitis presenting for f/u.   Past Medical History:  Diagnosis Date  . Anemia due to blood loss, chronic   . Anemia, iron deficiency   . Angiodysplasia of colon 05/09/2005   Single small angiodysplastic lesion in the descending colon found on colonoscopy 05/09/2005 by [name]  . Arthritis    "knots on my thumbs; left elbow" (03/03/2014)  . Cataracts, bilateral   . Chronic ankle pain    bilateral  . Chronic kidney disease, stage IV (severe) ([place])    [name] 02/09/2014  . Chronic knee pain    bilateral  . Chronic thumb pain   . Depression   . Epicondylitis   . Gastritis, Helicobacter pylori    Noted on EGD 01/18/2009 by [name];  biopsy showed chronic gastritis with Helicobacter pylori, treated by [name].  [name] GERD (gastroesophageal reflux disease)   . Gout   . Hemorrhoids, internal 05/09/2005    Found on colonoscopy 05/09/2005 by [name]  . History of blood transfusion    "low blood count"  . Hypertension   . Osteoporosis, unspecified 03/06/2012   A DXA bone density scan done 02/20/2012 showed osteoporosis with a lumbar spine T-score of -4.4 and a right femur T-score of -2.8.  Patient has chronic kidney disease with estimated GFR less than 30, and it is not clear how much of her osteoporosis may be due to renal osteodystrophy.    . Pericardial effusion 11/2004   S/P subxiphoid pericardial window 11/01/2004 by [name].  A question of possible collagen vascular disease had been raised in 2006 due to arthralgias, a history of idiopathic pericarditis, and increased pigmentation of her palms, and she was referred to [name] but he was unable to confirm a rheumatologic diagnosis.  . Pinguecula   . Renal failure    Formerly on hemodialysis; managed by Dr. Corliss Parish  . Stiffness of  joint, not elsewhere classified, hand   . Stroke [facility]) 2007   "when my kidneys went out & I was in a coma; weak LUE/LLE since"  . Thigh pain   . Vitreous detachment    "no OR"    Physical Exam:  Vitals:   06/02/18 0843  BP: 137/72  Pulse: 80  Temp: 98.1 F (36.7 C)  TempSrc: Oral  SpO2: 98%  Weight: 157 lb (71.2 kg)  Height: 5\' 2"  (1.575 m)   Gen: well appearing, NAD Cardiac: RRR, no mrg Pulm: CTAB Abd: soft, NT ND + BS Ext: warm, no LEE  Assessment & Plan:   See Encounters Tab for problem based charting.  Patient discussed with Dr. Angelia Mould

## 2018-06-02 NOTE — Assessment & Plan Note (Signed)
Hospital admission 5/23-5/25 for acute pancreatitis diagnosed by CT findings. Interestingly, her lipase was not significantly elevated and her abdominal pain was more right sided. She was previously diagnosed with diverticulitis and started on amoxicillin. There have been reports of amoxicillin induced pancreatitis. Triglycerides were normal. No evidence of gall stones. No alcohol use. Nonetheless, her symptoms improved with npo, IVFs, and IV pain medication. Today, she denies any abdominal pain, n/v/d/constipation. Her appetite has returned to normal. She has resumed all her home medications.   - no f/u needed - may want to avoid penicillins in the future as this may have induced her pancreatitis

## 2018-06-02 NOTE — Assessment & Plan Note (Signed)
Well controlled. She has resumed home metoprolol, amlodipine, and lasix after hospital discharge. Will continue medications. Denies chest pain, sob, or dizziness upon standing.

## 2018-06-02 NOTE — Patient Instructions (Signed)
It was nice seeing you today. Thank you for choosing [facility] for your Primary Care.   Glad your abdominal pain has resolved! You should have refills of all your medicines available at your pharmacy.   FOLLOW-UP INSTRUCTIONS When: 3 months For: HTN, kidney disease What to bring: all medications   Please contact the clinic if you have any problems, or need to be seen sooner.

## 2018-06-04 NOTE — Progress Notes (Signed)
Internal Medicine Clinic Attending  Case discussed with Dr. Vogel  at the time of the visit.  We reviewed the resident's history and exam and pertinent patient test results.  I agree with the assessment, diagnosis, and plan of care documented in the resident's note.  

## 2018-06-10 ENCOUNTER — Other Ambulatory Visit: Payer: Self-pay | Admitting: Internal Medicine

## 2018-06-10 DIAGNOSIS — N184 Chronic kidney disease, stage 4 (severe): Secondary | ICD-10-CM

## 2018-07-07 ENCOUNTER — Other Ambulatory Visit: Payer: Self-pay

## 2018-07-07 ENCOUNTER — Emergency Department (HOSPITAL_COMMUNITY): Payer: Medicare Other

## 2018-07-07 ENCOUNTER — Encounter (HOSPITAL_COMMUNITY): Payer: Self-pay | Admitting: Emergency Medicine

## 2018-07-07 ENCOUNTER — Emergency Department (HOSPITAL_COMMUNITY)
Admission: EM | Admit: 2018-07-07 | Discharge: 2018-07-07 | Disposition: A | Discharge status: Home / Self Care | Payer: Medicare Other | Attending: Emergency Medicine | Admitting: Emergency Medicine

## 2018-07-07 DIAGNOSIS — N184 Chronic kidney disease, stage 4 (severe): Secondary | ICD-10-CM | POA: Diagnosis not present

## 2018-07-07 DIAGNOSIS — Z87891 Personal history of nicotine dependence: Secondary | ICD-10-CM | POA: Diagnosis not present

## 2018-07-07 DIAGNOSIS — K859 Acute pancreatitis without necrosis or infection, unspecified: Secondary | ICD-10-CM | POA: Diagnosis not present

## 2018-07-07 DIAGNOSIS — Z8673 Personal history of transient ischemic attack (TIA), and cerebral infarction without residual deficits: Secondary | ICD-10-CM | POA: Diagnosis not present

## 2018-07-07 DIAGNOSIS — I129 Hypertensive chronic kidney disease with stage 1 through stage 4 chronic kidney disease, or unspecified chronic kidney disease: Secondary | ICD-10-CM | POA: Diagnosis not present

## 2018-07-07 DIAGNOSIS — Z79899 Other long term (current) drug therapy: Secondary | ICD-10-CM | POA: Insufficient documentation

## 2018-07-07 DIAGNOSIS — K5790 Diverticulosis of intestine, part unspecified, without perforation or abscess without bleeding: Secondary | ICD-10-CM | POA: Diagnosis not present

## 2018-07-07 DIAGNOSIS — R1013 Epigastric pain: Secondary | ICD-10-CM | POA: Diagnosis not present

## 2018-07-07 DIAGNOSIS — R109 Unspecified abdominal pain: Secondary | ICD-10-CM | POA: Diagnosis present

## 2018-07-07 DIAGNOSIS — K861 Other chronic pancreatitis: Secondary | ICD-10-CM | POA: Insufficient documentation

## 2018-07-07 LAB — COMPREHENSIVE METABOLIC PANEL
ALT: 18 U/L (ref 0–44)
AST: 22 U/L (ref 15–41)
Albumin: 3.6 g/dL (ref 3.5–5.0)
Alkaline Phosphatase: 116 U/L (ref 38–126)
Anion gap: 8 (ref 5–15)
BUN: 46 mg/dL — ABNORMAL HIGH (ref 8–23)
CO2: 23 mmol/L (ref 22–32)
Calcium: 9.3 mg/dL (ref 8.9–10.3)
Chloride: 109 mmol/L (ref 98–111)
Creatinine, Ser: 2.33 mg/dL — ABNORMAL HIGH (ref 0.44–1.00)
GFR calc Af Amer: 22 mL/min — ABNORMAL LOW (ref >60)
GFR calc non Af Amer: 19 mL/min — ABNORMAL LOW (ref >60)
Glucose, Bld: 97 mg/dL (ref 70–99)
Potassium: 4.7 mmol/L (ref 3.5–5.1)
Sodium: 140 mmol/L (ref 135–145)
Total Bilirubin: 0.7 mg/dL (ref 0.3–1.2)
Total Protein: 7 g/dL (ref 6.5–8.1)

## 2018-07-07 LAB — CBC WITH DIFFERENTIAL/PLATELET
Abs Immature Granulocytes: 0.01 10*3/uL (ref 0.00–0.07)
Basophils Absolute: 0 10*3/uL (ref 0.0–0.1)
Basophils Relative: 0 %
Eosinophils Absolute: 0.2 10*3/uL (ref 0.0–0.5)
Eosinophils Relative: 3 %
HCT: 39.8 % (ref 36.0–46.0)
Hemoglobin: 12.6 g/dL (ref 12.0–15.0)
Immature Granulocytes: 0 %
Lymphocytes Relative: 28 %
Lymphs Abs: 1.5 10*3/uL (ref 0.7–4.0)
MCH: 31 pg (ref 26.0–34.0)
MCHC: 31.7 g/dL (ref 30.0–36.0)
MCV: 98 fL (ref 80.0–100.0)
Monocytes Absolute: 0.4 10*3/uL (ref 0.1–1.0)
Monocytes Relative: 7 %
Neutro Abs: 3.3 10*3/uL (ref 1.7–7.7)
Neutrophils Relative %: 62 %
Platelets: 191 10*3/uL (ref 150–400)
RBC: 4.06 MIL/uL (ref 3.87–5.11)
RDW: 14.3 % (ref 11.5–15.5)
WBC: 5.4 10*3/uL (ref 4.0–10.5)
nRBC: 0 % (ref 0.0–0.2)

## 2018-07-07 LAB — LIPASE, BLOOD: Lipase: 45 U/L (ref 11–51)

## 2018-07-07 LAB — LACTIC ACID, PLASMA: Lactic Acid, Venous: 0.6 mmol/L (ref 0.5–1.9)

## 2018-07-07 MED ORDER — MORPHINE SULFATE (PF) 4 MG/ML IV SOLN
4.0000 mg | Freq: Once | INTRAVENOUS | Status: AC
  Administered 2018-07-07: 4 mg via INTRAVENOUS
  Filled 2018-07-07: qty 1

## 2018-07-07 MED ORDER — HYDROCODONE-ACETAMINOPHEN 5-325 MG PO TABS: 1.0000 | ORAL_TABLET | Freq: Four times a day (QID) | ORAL | 0 refills | Status: DC | PRN

## 2018-07-07 MED ORDER — ONDANSETRON 4 MG PO TBDP: 4.0000 mg | ORAL_TABLET | ORAL | 0 refills | Status: DC | PRN

## 2018-07-07 NOTE — ED Triage Notes (Signed)
Pt here for abdominal pain that started today. Pt unsure if it is her pancreas.

## 2018-07-07 NOTE — Discharge Instructions (Addendum)
1.  Is very important you follow-up with your doctor.  You have had bouts of swelling in your pancreas.  This has been recurrent.  Discussed referral to a specialist called a gastroenterologist for further evaluation. 2.  Be very careful to eat a very low-fat and bland diet with small amounts at a time.  Stay well-hydrated.  Absolutely no alcohol. 3.  Take 1-2 Vicodin tablets every 6 hours if needed for pain control. 4.  Return to the emergency department if you are having worsening pain, fever or other concerning symptoms.

## 2018-07-07 NOTE — ED Provider Notes (Signed)
[place] EMERGENCY DEPARTMENT Provider Note   CSN: [id number] Arrival date & time: 07/07/18  1340     History   Chief Complaint No chief complaint on file.   HPI [name] is a 84 y.o. female.     HPI Patient reports that she started with some very mild abdominal discomfort earlier today.  She did not think it was very notable.  She continued running her usual errands and shopping.  She reports however over the course of the afternoon pain became much more severe and just a deep intense ache in her central and mid upper abdomen.  Ports this does feel some more to previous pancreatitis.  She has not developed any vomiting or diarrhea.  She does not have any pain burning urgency with urination.  She denies fever.  She has not had any recent cough or shortness of breath.  She reports before this started she was feeling all around pretty well.  Patient does not drink any alcohol. Past Medical History:  Diagnosis Date  . Anemia due to blood loss, chronic   . Anemia, iron deficiency   . Angiodysplasia of colon 05/09/2005   Single small angiodysplastic lesion in the descending colon found on colonoscopy 05/09/2005 by [name]  . Arthritis    "knots on my thumbs; left elbow" (03/03/2014)  . Cataracts, bilateral   . Chronic ankle pain    bilateral  . Chronic kidney disease, stage IV (severe) ([place] II)    [name] 02/09/2014  . Chronic knee pain    bilateral  . Chronic thumb pain   . Depression   . Epicondylitis   . Gastritis, Helicobacter pylori    Noted on EGD 01/18/2009 by [name];  biopsy showed chronic gastritis with Helicobacter pylori, treated by [name].  [name] GERD (gastroesophageal reflux disease)   . Gout   . Hemorrhoids, internal 05/09/2005    Found on colonoscopy 05/09/2005 by [name]  . History of blood transfusion    "low blood count"  . Hypertension   . Osteoporosis, unspecified 03/06/2012   A DXA bone density scan done  02/20/2012 showed osteoporosis with a lumbar spine T-score of -4.4 and a right femur T-score of -2.8.  Patient has chronic kidney disease with estimated GFR less than 30, and it is not clear how much of her osteoporosis may be due to renal osteodystrophy.    . Pericardial effusion 11/2004   S/P subxiphoid pericardial window 11/01/2004 by [name].  A question of possible collagen vascular disease had been raised in 2006 due to arthralgias, a history of idiopathic pericarditis, and increased pigmentation of her palms, and she was referred to [name] but he was unable to confirm a rheumatologic diagnosis.  . Pinguecula   . Renal failure    Formerly on hemodialysis; managed by Dr. Corliss Parish  . Stiffness of joint, not elsewhere classified, hand   . Stroke [facility]) 2007   "when my kidneys went out & I was in a coma; weak LUE/LLE since"  . Thigh pain   . Vitreous detachment    "no OR"    Patient Active Problem List   Diagnosis Date Noted  . Acute pancreatitis 05/24/2018  . Diverticulitis of large intestine without perforation or abscess without bleeding 05/21/2018  . Subjective tinnitus of both ears 01/24/2018  . Chest pain, non-cardiac 05/30/2017  . Benign positional vertigo 09/21/2016  . Preventative health care 09/11/2016  . Vaginal mass 07/19/2016  .  Insomnia 05/03/2015  . GERD (gastroesophageal reflux disease) 05/03/2015  . Fatigue 04/20/2015  . Meningioma ([place]) 03/01/2015  . History of CVA (cerebrovascular accident) 01/24/2015  . Gout 03/03/2014  . Hearing loss 11/03/2013  . Osteoporosis 03/06/2012  . Depression 08/30/2009  . Iron deficiency anemia 06/01/2009  . Disease of pericardium 06/01/2009  . CKD (chronic kidney disease) stage 4, GFR 15-29 ml/min (HCC) 06/01/2009  . CATARACTS 10/24/2005  . Essential hypertension 10/24/2005  . ANGIODYSPLASIA, COLON 05/09/2005    Past Surgical History:  Procedure Laterality Date  . ABDOMINAL HYSTERECTOMY  1960's   . ARTERIOVENOUS GRAFT PLACEMENT Left 11/28/2005   forearm arteriovenous  . CARDIAC CATHETERIZATION  01/2005   [name] 05/15/2010  . HALLUX VALGUS CORRECTION Left 01/2012   foot  . SUBXYPHOID PERICARDIAL WINDOW  11/01/2004  . THROMBECTOMY Left  03/06/2006   Simple thrombectomy arteriovenous Gore-Tex graft, left arm, with exploration of venous end and intraoperative shuntogram.         . THROMBECTOMY / ARTERIOVENOUS GRAFT REVISION Left 01/21/2006   Thrombectomy and revision of left forearm loop AV graft.  . TUBAL LIGATION  1960's   "before hysterectomy"     OB History   No obstetric history on file.      Home Medications    Prior to Admission medications   Medication Sig Start Date End Date Taking? Authorizing Provider  allopurinol (ZYLOPRIM) 100 MG tablet Take 1.5 tablets (150 mg total) by mouth daily. 01/14/18   [name], MD  amLODipine (NORVASC) 10 MG tablet Take 1 tablet (10 mg total) by mouth at bedtime. 07/09/17   [name], MD  buPROPion (WELLBUTRIN XL) 300 MG 24 hr tablet TAKE 1 TABLET(300 MG) BY MOUTH DAILY Patient taking differently: Take 300 mg by mouth daily.  04/03/18   [name], MD  docusate sodium (COLACE) 100 MG capsule Take 100 mg by mouth daily.    [provider]  famotidine (PEPCID) 20 MG tablet Take 1 tablet (20 mg total) by mouth 2 (two) times daily. 01/14/18   [name], MD  furosemide (LASIX) 80 MG tablet TAKE 1/2 TABLET(40 MG) BY MOUTH DAILY 06/10/18   [name], MD  HYDROcodone-acetaminophen (NORCO/VICODIN) 5-325 MG tablet Take 1-2 tablets by mouth every 6 (six) hours as needed for moderate pain or severe pain. 07/07/18   Charlesetta Shanks, MD  Melatonin 5 MG CAPS Take 1 capsule (5 mg total) by mouth at bedtime. 05/21/18   Lorella Nimrod, MD  metoprolol tartrate (LOPRESSOR) 25 MG tablet Take 0.5 tablets (12.5 mg total) by mouth 2 (two) times daily. 05/21/18   Lorella Nimrod, MD  Multiple Vitamins-Minerals (MULTIVITAMIN WITH  MINERALS) tablet Take 1 tablet by mouth daily. Centrum Silver    [provider]  omeprazole (PRILOSEC) 20 MG capsule Take 1 capsule (20 mg total) by mouth 2 (two) times daily. 02/05/18   [name], MD  ondansetron (ZOFRAN ODT) 4 MG disintegrating tablet Take 1 tablet (4 mg total) by mouth every 4 (four) hours as needed for nausea or vomiting. 07/07/18   Charlesetta Shanks, MD  SENSIPAR 30 MG tablet TAKE 1 TABLET BY MOUTH ONCE DAILY Patient taking differently: Take 30 mg by mouth daily with breakfast.  10/02/16   [name], MD  FLUoxetine (PROZAC) 10 MG tablet Take 1 tablet (10 mg total) by mouth daily. 03/07/11 06/02/18  [name], MD    Family History Family History  Problem Relation Age of Onset  . Hypertension Brother   . Heart disease Father   .  Diabetes Brother   . Breast cancer Neg Hx   . Colon cancer Neg Hx   . Lung cancer Neg Hx   . Cervical cancer Neg Hx   . Sickle cell anemia Neg Hx     Social History Social History   Tobacco Use  . Smoking status: Former Smoker    Packs/day: 0.10    Years: 1.00    Pack years: 0.10    Types: Cigarettes    Quit date: 01/02/1960    Years since quitting: 58.5  . Smokeless tobacco: Never Used  . Tobacco comment: patient stated has never smoked   Substance Use Topics  . Alcohol use: No    Alcohol/week: 0.0 standard drinks  . Drug use: No     Allergies   Ibuprofen   Review of Systems Review of Systems 10 Systems reviewed and are negative for acute change except as noted in the HPI.-0  Physical Exam Updated Vital Signs BP (!) 172/92   Pulse 80   Temp 98.2 F (36.8 C) (Oral)   Resp (!) 21   Ht 5\' 1"  (1.549 m)   Wt 68 kg   SpO2 96%   BMI 28.34 kg/m   Physical Exam Constitutional:      Appearance: She is well-developed.     Comments: Patient is alert and nontoxic.  She is sitting up in the chair at the bedside holding her upper abdomen.  No respiratory distress.  HENT:     Head: Normocephalic and  atraumatic.  Eyes:     Extraocular Movements: Extraocular movements intact.     Conjunctiva/sclera: Conjunctivae normal.  Neck:     Musculoskeletal: Neck supple.  Cardiovascular:     Rate and Rhythm: Normal rate and regular rhythm.     Heart sounds: Normal heart sounds.  Pulmonary:     Effort: Pulmonary effort is normal.     Breath sounds: Normal breath sounds.  Abdominal:     General: Bowel sounds are normal. There is no distension.     Palpations: Abdomen is soft.     Tenderness: There is abdominal tenderness.     Comments: Abdomen is soft without guarding.  Mild to moderate epigastric pain to palpation.  No palpable masses.  She does have lower midline incisions.  Cannot appreciate any incisional hernias.  Musculoskeletal: Normal range of motion.  Skin:    General: Skin is warm and dry.  Neurological:     Mental Status: She is alert and oriented to person, place, and time.     GCS: GCS eye subscore is 4. GCS verbal subscore is 5. GCS motor subscore is 6.     Coordination: Coordination normal.  Psychiatric:        Mood and Affect: Mood normal.      ED Treatments / Results  Labs (all labs ordered are listed, but only abnormal results are displayed) Labs Reviewed  COMPREHENSIVE METABOLIC PANEL - Abnormal; Notable for the following components:      Result Value   BUN 46 (*)    Creatinine, Ser 2.33 (*)    GFR calc non Af Amer 19 (*)    GFR calc Af Amer 22 (*)    All other components within normal limits  LIPASE, BLOOD  LACTIC ACID, PLASMA  CBC WITH DIFFERENTIAL/PLATELET  LACTIC ACID, PLASMA  URINALYSIS, ROUTINE W REFLEX MICROSCOPIC    EKG None  Radiology Ct Abdomen Pelvis Wo Contrast  Result Date: 07/07/2018 CLINICAL DATA:  Epigastric pain for 2 days EXAM:  CT ABDOMEN AND PELVIS WITHOUT CONTRAST TECHNIQUE: Multidetector CT imaging of the abdomen and pelvis was performed following the standard protocol without IV contrast. COMPARISON:  05/24/2018 FINDINGS: Lower  chest: Tiny bilateral pleural effusions are noted left worse than right. No focal infiltrate is seen. Small sliding-type hiatal hernia is noted. Hepatobiliary: No focal liver abnormality is seen. No gallstones, gallbladder wall thickening, or biliary dilatation. Pancreas: Peripancreatic inflammatory changes are again identified consistent with pancreatitis. No definitive pseudocyst is seen. Spleen: Normal in size without focal abnormality. Adrenals/Urinary Tract: Adrenal glands are within normal limits. Kidneys are well visualized bilaterally. 7 mm nonobstructing stone is noted in the mid to lower left kidney. No ureteral stones are seen. The bladder is partially distended. Stomach/Bowel: Scattered diverticular changes noted without evidence of diverticulitis. No obstructive or inflammatory changes are seen. Vascular/Lymphatic: Aortic atherosclerosis. No enlarged abdominal or pelvic lymph nodes. Reproductive: Pessary is noted in place. The uterus has been surgically removed. Other: No abdominal wall hernia or abnormality. No abdominopelvic ascites. Musculoskeletal: Degenerative changes of lumbar spine are noted. Mild anterolisthesis of L4 on L5 and L5 on S1 is noted. This is stable from the prior exam. IMPRESSION: Changes consistent with acute pancreatitis without pseudocyst formation. These are similar to that seen on the prior exam. Nonobstructing left renal stone also stable from the prior study. Diverticulosis without diverticulitis. Electronically Signed   By: Inez Catalina M.D.   On: 07/07/2018 17:29    Procedures Procedures (including critical care time)  Medications Ordered in ED Medications  morphine 4 MG/ML injection 4 mg (4 mg Intravenous Given 07/07/18 1534)     Initial Impression / Assessment and Plan / ED Course  I have reviewed the triage vital signs and the nursing notes.  Pertinent labs & imaging results that were available during my care of the patient were reviewed by me and considered  in my medical decision making (see chart for details).       Patient got pain relief from 1 dose of morphine.  She has no further symptoms.  pain started quite acutely today.  CT shows a similar pattern of pancreatic inflammation from prior studies.  Lipase is not elevated.  Reports she has a very distant history of having a cyst drained which sounds consistent with pseudocyst.  She reports she has not seen a gastroenterologist in a very long time.  Clinically, the patient is well in appearance.  She has not had fever, nausea, vomiting, diarrhea.  Pain episode came on really rapidly over the course of the day.  At this time she is completely pain control.  Plan will be to implement pain control and precautions for pancreatic diet.  Patient has follow-up appointment with her PCP tomorrow.  Return precautions are reviewed.  She is counseled to discuss referral to GI for a repeat evaluation since she has had some CT evidence of chronic pancreatic inflammation but no alcohol history or other etiology for chronic pancreatitis.  Final Clinical Impressions(s) / ED Diagnoses   Final diagnoses:  Epigastric pain  Chronic pancreatitis, unspecified pancreatitis type [facility])    ED Discharge Orders         Ordered    HYDROcodone-acetaminophen (NORCO/VICODIN) 5-325 MG tablet  Every 6 hours PRN     07/07/18 1925    ondansetron (ZOFRAN ODT) 4 MG disintegrating tablet  Every 4 hours PRN     07/07/18 1925           Charlesetta Shanks, MD 07/07/18 1947

## 2018-07-07 NOTE — ED Notes (Signed)
Patient verbalizes understanding of discharge instructions. Opportunity for questioning and answers were provided. Armband removed by staff, pt discharged from ED ambulatory to home.  

## 2018-07-07 NOTE — ED Notes (Signed)
Patient transported to CT 

## 2018-07-08 ENCOUNTER — Ambulatory Visit (INDEPENDENT_AMBULATORY_CARE_PROVIDER_SITE_OTHER): Payer: Medicare Other | Admitting: Internal Medicine

## 2018-07-08 ENCOUNTER — Other Ambulatory Visit: Payer: Self-pay

## 2018-07-08 ENCOUNTER — Encounter: Payer: Self-pay | Admitting: Internal Medicine

## 2018-07-08 ENCOUNTER — Encounter: Payer: Medicare Other | Admitting: Internal Medicine

## 2018-07-08 VITALS — BP 144/80 | HR 84 | Temp 98.1°F | Ht 62.0 in | Wt 155.7 lb

## 2018-07-08 DIAGNOSIS — I129 Hypertensive chronic kidney disease with stage 1 through stage 4 chronic kidney disease, or unspecified chronic kidney disease: Secondary | ICD-10-CM

## 2018-07-08 DIAGNOSIS — N184 Chronic kidney disease, stage 4 (severe): Secondary | ICD-10-CM

## 2018-07-08 DIAGNOSIS — K219 Gastro-esophageal reflux disease without esophagitis: Secondary | ICD-10-CM

## 2018-07-08 DIAGNOSIS — R1033 Periumbilical pain: Secondary | ICD-10-CM

## 2018-07-08 DIAGNOSIS — K853 Drug induced acute pancreatitis without necrosis or infection: Secondary | ICD-10-CM

## 2018-07-08 DIAGNOSIS — F32A Depression, unspecified: Secondary | ICD-10-CM

## 2018-07-08 DIAGNOSIS — Z79899 Other long term (current) drug therapy: Secondary | ICD-10-CM

## 2018-07-08 DIAGNOSIS — K859 Acute pancreatitis without necrosis or infection, unspecified: Secondary | ICD-10-CM

## 2018-07-08 DIAGNOSIS — F329 Major depressive disorder, single episode, unspecified: Secondary | ICD-10-CM

## 2018-07-08 DIAGNOSIS — K5900 Constipation, unspecified: Secondary | ICD-10-CM

## 2018-07-08 DIAGNOSIS — I10 Essential (primary) hypertension: Secondary | ICD-10-CM

## 2018-07-08 NOTE — Assessment & Plan Note (Signed)
-  This problem is chronic and stable -I am uncertain why patient was on omeprazole as well as Pepcid. -At her last appointment she was told to stop taking both and to restart only omeprazole if needed -We will follow-up with [place] on her next visit.

## 2018-07-08 NOTE — Assessment & Plan Note (Signed)
-  Patient was seen in emergency room yesterday for severe abdominal pain -She had a CT abdomen/pelvis which showed acute pancreatitis -Patient was given morphine in the ED which improved her abdominal pain and she was DC'd home with a prescription for Vicodin for pain as well as Zofran as needed for nausea -Patient has yet to pick up these medications -Her LFTs and her lipase are within normal limits in the ED -Patient states that her abdominal pain is improving but is still present -On exam today patient had moderate tenderness to palpation over her periumbilical and suprapubic region -She is able to tolerate oral intake currently (took boost last night and this morning) -Advised patient to take small frequent meals as well as avoid fatty food -If abdominal pain worsens I advised the patient to call the clinic or follow-up in the emergency room -We will continue to monitor closely

## 2018-07-08 NOTE — Assessment & Plan Note (Signed)
-  This problem is chronic and stable -Patient complains of increased rest at home from her daughter-in-law and son living with her -We will continue with Wellbutrin for now and have patient follow-up with behavioral health clinician in [facility] Miquel Dunn) -Referral placed for patient to follow-up with Miquel Dunn -No further work-up at this time

## 2018-07-08 NOTE — Patient Instructions (Signed)
-  It was a pleasure seeing you today -Please continue with the pain medication prescribed to by the emergency room as needed for your abdominal pain -If the pain worsens or if you are unable to eat and drink please follow-up in the clinic or the emergency room -I will refer you to our behavioral health clinician -Miquel Dunn to help you with the increase stress in your life -Continue with the current medications at this time -Please call me if you have any questions

## 2018-07-08 NOTE — Assessment & Plan Note (Signed)
-  This problem is chronic and stable -Patient's creatinine is at her baseline -Patient to follow-up with nephrology as an outpatient -No further work-up at this time

## 2018-07-08 NOTE — Progress Notes (Signed)
   Subjective:    Patient ID: [name], female    DOB: 1934-05-26, 84 y.o.   MRN: [id number]  HPI  I have seen and examined this patient.  Patient is here for follow-up from the emergency room for abdominal pain likely secondary to pancreatitis.  Patient states that abdominal pain has improved since her visit to the emergency room that she is able to tolerate some oral intake (taking boost last night and this morning).  Patient also complains of some increased stress at home from her son and daughter-in-law living with her.  States that she is compliant with her other medications and denies other complaints at this time.  Review of Systems  Constitutional: Negative.   HENT: Negative.   Respiratory: Negative.   Cardiovascular: Negative.   Gastrointestinal: Positive for abdominal pain and constipation. Negative for nausea and vomiting.  Musculoskeletal: Negative.   Skin: Negative.   Neurological: Negative.   Psychiatric/Behavioral: Negative.        Objective:   Physical Exam Constitutional:      Appearance: Normal appearance.  HENT:     Head: Normocephalic and atraumatic.  Eyes:     Pupils: Pupils are equal, round, and reactive to light.  Neck:     Musculoskeletal: Neck supple.  Cardiovascular:     Rate and Rhythm: Normal rate and regular rhythm.     Heart sounds: Normal heart sounds.  Pulmonary:     Effort: Pulmonary effort is normal.     Breath sounds: Normal breath sounds. No wheezing, rhonchi or rales.  Abdominal:     General: Abdomen is flat. Bowel sounds are normal.     Palpations: Abdomen is soft.     Tenderness: There is abdominal tenderness. There is no guarding or rebound.     Comments: Patient with tenderness to palpation in the periumbilical and suprapubic region.  No rebound or guarding  Musculoskeletal:        General: No swelling or tenderness.  Skin:    General: Skin is warm and dry.  Neurological:     General: No focal deficit present.     Mental  Status: She is alert and oriented to person, place, and time.  Psychiatric:        Mood and Affect: Mood normal.        Behavior: Behavior normal.           Assessment & Plan:  Please see problem discharging for assessment and plan:

## 2018-07-08 NOTE — Assessment & Plan Note (Signed)
BP Readings from Last 3 Encounters:  07/08/18 (!) 144/80  07/07/18 (!) 153/83  06/02/18 137/72    Lab Results  Component Value Date   NA 140 07/07/2018   K 4.7 07/07/2018   CREATININE 2.33 (H) 07/07/2018    Assessment: Blood pressure control:  Fair Progress toward BP goal:   Stable Comments: Patient is compliant with Lasix 40 mg, metoprolol 12.5 mg twice daily as well as amlodipine 10 mg  Plan: Medications:  continue current medications Educational resources provided:   Self management tools provided:   Other plans: I suspect that the mild increased blood pressure today may be secondary to her abdominal pain as well as increased stress at home.  We will follow-up on next visit

## 2018-07-11 ENCOUNTER — Telehealth: Payer: Self-pay | Admitting: Licensed Clinical Social Worker

## 2018-07-11 NOTE — Telephone Encounter (Signed)
Patient agreed to establishing an initial appointment on 7/16 @ 9:30.

## 2018-07-16 ENCOUNTER — Other Ambulatory Visit: Payer: Self-pay | Admitting: Internal Medicine

## 2018-07-16 ENCOUNTER — Ambulatory Visit (INDEPENDENT_AMBULATORY_CARE_PROVIDER_SITE_OTHER): Payer: Medicare Other | Admitting: Licensed Clinical Social Worker

## 2018-07-16 ENCOUNTER — Other Ambulatory Visit: Payer: Self-pay

## 2018-07-16 ENCOUNTER — Encounter: Payer: Self-pay | Admitting: Licensed Clinical Social Worker

## 2018-07-16 DIAGNOSIS — I10 Essential (primary) hypertension: Secondary | ICD-10-CM

## 2018-07-16 DIAGNOSIS — F4321 Adjustment disorder with depressed mood: Secondary | ICD-10-CM

## 2018-07-16 NOTE — BH Specialist Note (Signed)
Integrated Behavioral Health Visit via Telemedicine (Telephone)  07/16/2018 [name] [id number]   Session Start time: 9:30  Session End time: 10:00 Total time: 30 minutes  Referring Provider: Dr. Dareen Piano Type of Visit: Telephonic Patient location: [facility] Provider location: Remote All persons participating in visit: [facility], and patient   Confirmed patient's address: Yes  Confirmed patient's phone number: Yes  Any changes to demographics: No   Discussed confidentiality: Yes    The following statements were read to the patient and/or legal guardian that are established with the [facility] Provider.  "The purpose of this phone visit is to provide behavioral health care while limiting exposure to the coronavirus (COVID19).  There is a possibility of technology failure and discussed alternative modes of communication if that failure occurs."  "By engaging in this telephone visit, you consent to the provision of healthcare.  Additionally, you authorize for your insurance to be billed for the services provided during this telephone visit."   Patient and/or legal guardian consented to telephone visit: Yes   PRESENTING CONCERNS: Patient and/or family reports the following symptoms/concerns: health issues and difficulty adjusting to isolation (coronavirus) Duration of problem: increased since March (Covid-19); Severity of problem: mild  STRENGTHS (Protective Factors/Coping Skills):  Strong family bond.   GOALS ADDRESSED: Patient will: 1.  Reduce symptoms of: mild sadness.  2.  Increase knowledge and/or ability of: coping skills and healthy habits  3.  Demonstrate ability to: Increase healthy adjustment to current life circumstances  INTERVENTIONS: Interventions utilized:  Brief CBT and Supportive Counseling Standardized Assessments completed: assessed for SI, HI, and self-harm.  ASSESSMENT: Patient currently experiencing mild sadness due to adjusting to lifestyle  changes because of COVID-19 restrictions. Patient always travels to see family in [place] twice a year, and she has not been able to do due to [place]. Patient processed staying home by herself feels as if "things are closing in on her".  Patient reported she cannot travel because of her health risk and age.   Patient has a supportive family. Patient misses her family very much. Patient is struggling to find activities to do at home by herself. Patient reported these activities do not hold her attention. Patient reported since she has been less active, she is sleeping less at night.   Patient may benefit from bi-weekly outpatient counseling.  PLAN: 1. Follow up with behavioral health clinician on : two weeks via phone.   Dessie Coma, [facility], [place]

## 2018-07-23 DIAGNOSIS — H905 Unspecified sensorineural hearing loss: Secondary | ICD-10-CM | POA: Diagnosis not present

## 2018-07-30 ENCOUNTER — Ambulatory Visit: Payer: Medicare Other | Admitting: Licensed Clinical Social Worker

## 2018-07-30 ENCOUNTER — Telehealth: Payer: Self-pay | Admitting: Licensed Clinical Social Worker

## 2018-07-30 NOTE — Telephone Encounter (Signed)
Patient was contacted for our scheduled appointment. Patient reported that she no longer wants to participate in counseling due to negative memories resurfacing after our last session. Patient is not ready to process past memories. Patient understands that she can reengage in services if she wants to in the future.

## 2018-09-01 DIAGNOSIS — M109 Gout, unspecified: Secondary | ICD-10-CM | POA: Diagnosis not present

## 2018-09-01 DIAGNOSIS — N184 Chronic kidney disease, stage 4 (severe): Secondary | ICD-10-CM | POA: Diagnosis not present

## 2018-09-01 DIAGNOSIS — D631 Anemia in chronic kidney disease: Secondary | ICD-10-CM | POA: Diagnosis not present

## 2018-09-01 DIAGNOSIS — N390 Urinary tract infection, site not specified: Secondary | ICD-10-CM | POA: Diagnosis not present

## 2018-09-01 DIAGNOSIS — N8110 Cystocele, unspecified: Secondary | ICD-10-CM | POA: Diagnosis not present

## 2018-09-01 DIAGNOSIS — I129 Hypertensive chronic kidney disease with stage 1 through stage 4 chronic kidney disease, or unspecified chronic kidney disease: Secondary | ICD-10-CM | POA: Diagnosis not present

## 2018-09-29 DIAGNOSIS — N8111 Cystocele, midline: Secondary | ICD-10-CM | POA: Diagnosis not present

## 2018-10-07 DIAGNOSIS — N8111 Cystocele, midline: Secondary | ICD-10-CM | POA: Diagnosis not present

## 2018-10-14 ENCOUNTER — Other Ambulatory Visit: Payer: Self-pay | Admitting: Internal Medicine

## 2018-10-14 DIAGNOSIS — K219 Gastro-esophageal reflux disease without esophagitis: Secondary | ICD-10-CM

## 2019-02-18 ENCOUNTER — Other Ambulatory Visit: Payer: Self-pay

## 2019-02-18 ENCOUNTER — Emergency Department (HOSPITAL_COMMUNITY): Payer: Medicare Other

## 2019-02-18 ENCOUNTER — Emergency Department (HOSPITAL_COMMUNITY)
Admission: EM | Admit: 2019-02-18 | Discharge: 2019-02-18 | Disposition: A | Discharge status: Home / Self Care | Payer: Medicare Other | Attending: Emergency Medicine | Admitting: Emergency Medicine

## 2019-02-18 ENCOUNTER — Encounter (HOSPITAL_COMMUNITY): Payer: Self-pay

## 2019-02-18 DIAGNOSIS — Z886 Allergy status to analgesic agent status: Secondary | ICD-10-CM | POA: Diagnosis not present

## 2019-02-18 DIAGNOSIS — J329 Chronic sinusitis, unspecified: Secondary | ICD-10-CM | POA: Diagnosis not present

## 2019-02-18 DIAGNOSIS — R110 Nausea: Secondary | ICD-10-CM | POA: Diagnosis not present

## 2019-02-18 DIAGNOSIS — Z87891 Personal history of nicotine dependence: Secondary | ICD-10-CM | POA: Diagnosis not present

## 2019-02-18 DIAGNOSIS — N184 Chronic kidney disease, stage 4 (severe): Secondary | ICD-10-CM | POA: Diagnosis not present

## 2019-02-18 DIAGNOSIS — Z79899 Other long term (current) drug therapy: Secondary | ICD-10-CM | POA: Insufficient documentation

## 2019-02-18 DIAGNOSIS — I6782 Cerebral ischemia: Secondary | ICD-10-CM | POA: Insufficient documentation

## 2019-02-18 DIAGNOSIS — N3001 Acute cystitis with hematuria: Secondary | ICD-10-CM | POA: Diagnosis not present

## 2019-02-18 DIAGNOSIS — N3000 Acute cystitis without hematuria: Secondary | ICD-10-CM | POA: Diagnosis not present

## 2019-02-18 DIAGNOSIS — Z8249 Family history of ischemic heart disease and other diseases of the circulatory system: Secondary | ICD-10-CM | POA: Insufficient documentation

## 2019-02-18 DIAGNOSIS — R42 Dizziness and giddiness: Secondary | ICD-10-CM | POA: Diagnosis not present

## 2019-02-18 DIAGNOSIS — R519 Headache, unspecified: Secondary | ICD-10-CM | POA: Diagnosis not present

## 2019-02-18 DIAGNOSIS — I129 Hypertensive chronic kidney disease with stage 1 through stage 4 chronic kidney disease, or unspecified chronic kidney disease: Secondary | ICD-10-CM | POA: Diagnosis not present

## 2019-02-18 DIAGNOSIS — Z8673 Personal history of transient ischemic attack (TIA), and cerebral infarction without residual deficits: Secondary | ICD-10-CM | POA: Diagnosis not present

## 2019-02-18 DIAGNOSIS — D329 Benign neoplasm of meninges, unspecified: Secondary | ICD-10-CM | POA: Diagnosis not present

## 2019-02-18 LAB — URINALYSIS, ROUTINE W REFLEX MICROSCOPIC
Bilirubin Urine: NEGATIVE
Glucose, UA: NEGATIVE mg/dL
Hgb urine dipstick: NEGATIVE
Ketones, ur: NEGATIVE mg/dL
Nitrite: NEGATIVE
Protein, ur: NEGATIVE mg/dL
Specific Gravity, Urine: 1.012 (ref 1.005–1.030)
pH: 7 (ref 5.0–8.0)

## 2019-02-18 LAB — CBC
HCT: 40.9 % (ref 36.0–46.0)
Hemoglobin: 12.6 g/dL (ref 12.0–15.0)
MCH: 31.4 pg (ref 26.0–34.0)
MCHC: 30.8 g/dL (ref 30.0–36.0)
MCV: 102 fL — ABNORMAL HIGH (ref 80.0–100.0)
Platelets: 167 10*3/uL (ref 150–400)
RBC: 4.01 MIL/uL (ref 3.87–5.11)
RDW: 13.9 % (ref 11.5–15.5)
WBC: 4 10*3/uL (ref 4.0–10.5)
nRBC: 0 % (ref 0.0–0.2)

## 2019-02-18 LAB — COMPREHENSIVE METABOLIC PANEL
ALT: 19 U/L (ref 0–44)
AST: 23 U/L (ref 15–41)
Albumin: 3.9 g/dL (ref 3.5–5.0)
Alkaline Phosphatase: 125 U/L (ref 38–126)
Anion gap: 11 (ref 5–15)
BUN: 33 mg/dL — ABNORMAL HIGH (ref 8–23)
CO2: 20 mmol/L — ABNORMAL LOW (ref 22–32)
Calcium: 9.5 mg/dL (ref 8.9–10.3)
Chloride: 110 mmol/L (ref 98–111)
Creatinine, Ser: 2.39 mg/dL — ABNORMAL HIGH (ref 0.44–1.00)
GFR calc Af Amer: 21 mL/min — ABNORMAL LOW (ref >60)
GFR calc non Af Amer: 18 mL/min — ABNORMAL LOW (ref >60)
Glucose, Bld: 98 mg/dL (ref 70–99)
Potassium: 4.6 mmol/L (ref 3.5–5.1)
Sodium: 141 mmol/L (ref 135–145)
Total Bilirubin: 0.8 mg/dL (ref 0.3–1.2)
Total Protein: 7.1 g/dL (ref 6.5–8.1)

## 2019-02-18 LAB — LIPASE, BLOOD: Lipase: 40 U/L (ref 11–51)

## 2019-02-18 MED ORDER — CEPHALEXIN 250 MG PO CAPS
250.0000 mg | ORAL_CAPSULE | Freq: Once | ORAL | Status: AC
  Administered 2019-02-18: 21:00:00 250 mg via ORAL
  Filled 2019-02-18: qty 1

## 2019-02-18 MED ORDER — LORAZEPAM 2 MG/ML IJ SOLN
1.0000 mg | Freq: Once | INTRAMUSCULAR | Status: AC
  Administered 2019-02-18: 17:00:00 1 mg via INTRAVENOUS
  Filled 2019-02-18: qty 1

## 2019-02-18 MED ORDER — SODIUM CHLORIDE 0.9 % IV BOLUS
1000.0000 mL | Freq: Once | INTRAVENOUS | Status: AC
  Administered 2019-02-18: 1000 mL via INTRAVENOUS

## 2019-02-18 MED ORDER — FENTANYL CITRATE (PF) 100 MCG/2ML IJ SOLN
100.0000 ug | Freq: Once | INTRAMUSCULAR | Status: AC
  Administered 2019-02-18: 100 ug via INTRAVENOUS
  Filled 2019-02-18: qty 2

## 2019-02-18 MED ORDER — SODIUM CHLORIDE 0.9% FLUSH: 3.0000 mL | Freq: Once | INTRAVENOUS | Status: DC

## 2019-02-18 MED ORDER — PROMETHAZINE HCL 25 MG/ML IJ SOLN
12.5000 mg | Freq: Once | INTRAMUSCULAR | Status: AC
  Administered 2019-02-18: 19:00:00 12.5 mg via INTRAVENOUS
  Filled 2019-02-18: qty 1

## 2019-02-18 MED ORDER — CEPHALEXIN 250 MG PO CAPS: 500.0000 mg | ORAL_CAPSULE | Freq: Three times a day (TID) | ORAL | 0 refills | Status: DC

## 2019-02-18 NOTE — ED Notes (Signed)
Pt was discharged from the ED. Pt read and understood discharge paperwork. Pt had vital signs completed. Pt conscious, breathing, and A&Ox4. No distress noted. Pt speaking in complete sentences. Pt brought out of the ED via wheelchair. E-signature not available. 

## 2019-02-18 NOTE — ED Notes (Signed)
Medications given and charted per MAR. Tolerated well.  

## 2019-02-18 NOTE — ED Triage Notes (Signed)
Pt presents to ED w/nausea, "throbbing" headache and feeling "nervous" x2 days

## 2019-02-18 NOTE — ED Notes (Signed)
[name] Daughter [phone]    Would like pt updates.

## 2019-02-18 NOTE — ED Notes (Signed)
Pt tolerated food and drink  

## 2019-02-18 NOTE — Discharge Instructions (Signed)
Your work-up today did show you have a urinary tract infection will which we will treat with antibiotics.  Take antibiotics as directed. Please take all of your antibiotics until finished.  Additionally, as we discussed, your work-up today showed that you did not have a stroke.  Your imaging showed a stable meningioma which was seen previously on your imaging.  This appears stable and is likely not the cause of your symptoms.  As we discussed, we will plan to have you follow-up with neurology regarding her headaches and the noise that you hear.  I have provided a referral for an appointment within the next few weeks.  If you have not heard from them, call their office and arrange for a time.  Additionally, you need to follow-up with your primary care doctor in the next 2 to 3 days for further evaluation.  At some point, he will need to have you do a ultrasound of your carotids to ensure that there are no abnormalities.  Return the emergency department for any fevers, worsening headache, difficulty walking, difficulty moving your arms or legs, dizziness or any other worsening or concerning symptoms.

## 2019-02-18 NOTE — ED Notes (Signed)
Patient returned to unit from MRI; c/o severe headache and discomfort. ED provider notified.

## 2019-02-18 NOTE — ED Provider Notes (Signed)
[facility] Provider Note   CSN: [id number] Arrival date & time: 02/18/19  1240     History Chief Complaint  Patient presents with  . Nausea  . Headache    [name] is a 85 y.o. female past med history of anemia, angiodysplasia of the colon, depression, stroke who presents to the emergency department for evaluation of headache, nausea, lightheadedness and difficulty walking.  Patient tells me that for the last 3 to 4 weeks, she has had a low-grade headache.  She states she has felt like a "pressure and throbbing type headache" that makes her head feels like "it weighs 5 pounds."  She states that it is worse with movement and when she gets up in moves around, she feels lightheaded and dizzy.  She states that over the last week, she felt like her symptoms worsened.  She states that when she lays down and turns her head to the left, she hears a "whirringor snoring sensation" that disappears when she moves her head back and to midline.  She is also noted that when she walks, she starts drifting towards the side which she states is abnormal for her.  She states that she walks slowly, she can control the symptoms but as soon as she starts walking normally, she veers off to the side.  She states she has had some associated nausea but denies any vomiting.  No vision changes, chest pain, difficulty breathing, abdominal pain, numbness/weakness of arms or legs, fevers.  No preceding trauma, injury, fall.  The history is provided by the patient.       Past Medical History:  Diagnosis Date  . Anemia due to blood loss, chronic   . Anemia, iron deficiency   . Angiodysplasia of colon 05/09/2005   Single small angiodysplastic lesion in the descending colon found on colonoscopy 05/09/2005 by [name]  . Arthritis    "knots on my thumbs; left elbow" (03/03/2014)  . Cataracts, bilateral   . Chronic ankle pain    bilateral  . Chronic kidney disease,  stage IV (severe) ([place])    [name] 02/09/2014  . Chronic knee pain    bilateral  . Chronic thumb pain   . Depression   . Epicondylitis   . Gastritis, Helicobacter pylori    Noted on EGD 01/18/2009 by [name];  biopsy showed chronic gastritis with Helicobacter pylori, treated by [name].  [name] GERD (gastroesophageal reflux disease)   . Gout   . Hemorrhoids, internal 05/09/2005    Found on colonoscopy 05/09/2005 by [name]  . History of blood transfusion    "low blood count"  . Hypertension   . Osteoporosis, unspecified 03/06/2012   A DXA bone density scan done 02/20/2012 showed osteoporosis with a lumbar spine T-score of -4.4 and a right femur T-score of -2.8.  Patient has chronic kidney disease with estimated GFR less than 30, and it is not clear how much of her osteoporosis may be due to renal osteodystrophy.    . Pericardial effusion 11/2004   S/P subxiphoid pericardial window 11/01/2004 by [name].  A question of possible collagen vascular disease had been raised in 2006 due to arthralgias, a history of idiopathic pericarditis, and increased pigmentation of her palms, and she was referred to [name] but he was unable to confirm a rheumatologic diagnosis.  . Pinguecula   . Renal failure    Formerly on hemodialysis; managed by Dr. Corliss Parish  .  Stiffness of joint, not elsewhere classified, hand   . Stroke [facility]) 2007   "when my kidneys went out & I was in a coma; weak LUE/LLE since"  . Thigh pain   . Vitreous detachment    "no OR"    Patient Active Problem List   Diagnosis Date Noted  . Acute pancreatitis 05/24/2018  . Diverticulitis of large intestine without perforation or abscess without bleeding 05/21/2018  . Subjective tinnitus of both ears 01/24/2018  . Chest pain, non-cardiac 05/30/2017  . Benign positional vertigo 09/21/2016  . Preventative health care 09/11/2016  . Vaginal mass 07/19/2016  . Insomnia 05/03/2015  . GERD  (gastroesophageal reflux disease) 05/03/2015  . Fatigue 04/20/2015  . Meningioma ([place]) 03/01/2015  . History of CVA (cerebrovascular accident) 01/24/2015  . Gout 03/03/2014  . Hearing loss 11/03/2013  . Osteoporosis 03/06/2012  . Depression 08/30/2009  . Iron deficiency anemia 06/01/2009  . Disease of pericardium 06/01/2009  . CKD (chronic kidney disease) stage 4, GFR 15-29 ml/min ([facility]) 06/01/2009  . CATARACTS 10/24/2005  . Essential hypertension 10/24/2005  . ANGIODYSPLASIA, COLON 05/09/2005    Past Surgical History:  Procedure Laterality Date  . ABDOMINAL HYSTERECTOMY  1960's  . ARTERIOVENOUS GRAFT PLACEMENT Left 11/28/2005   forearm arteriovenous  . CARDIAC CATHETERIZATION  01/2005   [name] 05/15/2010  . HALLUX VALGUS CORRECTION Left 01/2012   foot  . SUBXYPHOID PERICARDIAL WINDOW  11/01/2004  . THROMBECTOMY Left  03/06/2006   Simple thrombectomy arteriovenous Gore-Tex graft, left arm, with exploration of venous end and intraoperative shuntogram.         . THROMBECTOMY / ARTERIOVENOUS GRAFT REVISION Left 01/21/2006   Thrombectomy and revision of left forearm loop AV graft.  . TUBAL LIGATION  1960's   "before hysterectomy"     OB History   No obstetric history on file.     Family History  Problem Relation Age of Onset  . Hypertension Brother   . Heart disease Father   . Diabetes Brother   . Breast cancer Neg Hx   . Colon cancer Neg Hx   . Lung cancer Neg Hx   . Cervical cancer Neg Hx   . Sickle cell anemia Neg Hx     Social History   Tobacco Use  . Smoking status: Former Smoker    Packs/day: 0.10    Years: 1.00    Pack years: 0.10    Types: Cigarettes    Quit date: 01/02/1960    Years since quitting: 59.1  . Smokeless tobacco: Never Used  . Tobacco comment: patient stated has never smoked   Substance Use Topics  . Alcohol use: No    Alcohol/week: 0.0 standard drinks  . Drug use: No    Home Medications Prior to Admission medications   Medication Sig  Start Date End Date Taking? Authorizing Provider  allopurinol (ZYLOPRIM) 100 MG tablet Take 1.5 tablets (150 mg total) by mouth daily. 01/14/18   [name], MD  amLODipine (NORVASC) 10 MG tablet TAKE 1 TABLET BY MOUTH EVERY NIGHT AT BEDTIME 07/16/18   [name], MD  buPROPion (WELLBUTRIN XL) 300 MG 24 hr tablet TAKE 1 TABLET(300 MG) BY MOUTH DAILY Patient taking differently: Take 300 mg by mouth daily.  04/03/18   [name], MD  cephALEXin (KEFLEX) 250 MG capsule Take 2 capsules (500 mg total) by mouth 3 (three) times daily for 7 days. 02/18/19 02/25/19  Volanda Napoleon, PA-C  famotidine (PEPCID) 20 MG tablet TAKE 1 TABLET BY MOUTH  TWICE DAILY 10/14/18   [name], MD  furosemide (LASIX) 80 MG tablet TAKE 1/2 TABLET(40 MG) BY MOUTH DAILY 06/10/18   [name], MD  HYDROcodone-acetaminophen (NORCO/VICODIN) 5-325 MG tablet Take 1-2 tablets by mouth every 6 (six) hours as needed for moderate pain or severe pain. 07/07/18   Charlesetta Shanks, MD  Melatonin 5 MG CAPS Take 1 capsule (5 mg total) by mouth at bedtime. 05/21/18   Lorella Nimrod, MD  metoprolol tartrate (LOPRESSOR) 25 MG tablet Take 0.5 tablets (12.5 mg total) by mouth 2 (two) times daily. 05/21/18   Lorella Nimrod, MD  Multiple Vitamins-Minerals (MULTIVITAMIN WITH MINERALS) tablet Take 1 tablet by mouth daily. Centrum Silver    [provider]  omeprazole (PRILOSEC) 20 MG capsule Take 1 capsule (20 mg total) by mouth 2 (two) times daily. 02/05/18   [name], MD  ondansetron (ZOFRAN ODT) 4 MG disintegrating tablet Take 1 tablet (4 mg total) by mouth every 4 (four) hours as needed for nausea or vomiting. 07/07/18   Charlesetta Shanks, MD  SENSIPAR 30 MG tablet TAKE 1 TABLET BY MOUTH ONCE DAILY Patient taking differently: Take 30 mg by mouth daily with breakfast.  10/02/16   [name], MD  FLUoxetine (PROZAC) 10 MG tablet Take 1 tablet (10 mg total) by mouth daily. 03/07/11 06/02/18  [name], MD     Allergies    Ibuprofen  Review of Systems   Review of Systems  Constitutional: Negative for fever.  Eyes: Negative for visual disturbance.  Respiratory: Negative for cough and shortness of breath.   Cardiovascular: Negative for chest pain.  Gastrointestinal: Negative for abdominal pain, nausea and vomiting.  Genitourinary: Negative for dysuria and hematuria.  Musculoskeletal: Positive for gait problem.  Neurological: Positive for dizziness, light-headedness and headaches. Negative for syncope and weakness.  All other systems reviewed and are negative.   Physical Exam Updated Vital Signs BP (!) 162/82 (BP Location: Right Arm)   Pulse 82   Temp 98.2 F (36.8 C) (Oral)   Resp 18   SpO2 98%   Physical Exam Vitals and nursing note reviewed.  Constitutional:      Appearance: Normal appearance. She is well-developed.  HENT:     Head: Normocephalic and atraumatic.  Eyes:     General: Lids are normal.     Conjunctiva/sclera: Conjunctivae normal.     Pupils: Pupils are equal, round, and reactive to light.     Comments: PERRL. EOMs intact. No nystagmus. No neglect.   Neck:     Vascular: No carotid bruit.     Comments: No carotid bruit noted bilaterally. Cardiovascular:     Rate and Rhythm: Normal rate and regular rhythm.     Pulses: Normal pulses.     Heart sounds: Normal heart sounds. No murmur. No friction rub. No gallop.   Pulmonary:     Effort: Pulmonary effort is normal.     Breath sounds: Normal breath sounds.     Comments: Lungs clear to auscultation bilaterally.  Symmetric chest rise.  No wheezing, rales, rhonchi. Abdominal:     Palpations: Abdomen is soft. Abdomen is not rigid.     Tenderness: There is no abdominal tenderness. There is no guarding.  Musculoskeletal:        General: Normal range of motion.     Cervical back: Full passive range of motion without pain.  Skin:    General: Skin is warm and dry.     Capillary Refill: Capillary refill takes less  than  2 seconds.  Neurological:     Mental Status: She is alert and oriented to person, place, and time.     Comments: Cranial nerves III-XII intact Follows commands, Moves all extremities  5/5 strength to BUE and BLE  Sensation intact throughout all major nerve distributions Normal finger to nose. No dysdiadochokinesia. No pronator drift. Initial gait was slow and staggered.  No wide-based gait.  When we had her go a little bit faster, she did have an episode where she slightly leaned towards the left and ran into the door/ No slurred speech. No facial droop.   Psychiatric:        Speech: Speech normal.     ED Results / Procedures / Treatments   Labs (all labs ordered are listed, but only abnormal results are displayed) Labs Reviewed  COMPREHENSIVE METABOLIC PANEL - Abnormal; Notable for the following components:      Result Value   CO2 20 (*)    BUN 33 (*)    Creatinine, Ser 2.39 (*)    GFR calc non Af Amer 18 (*)    GFR calc Af Amer 21 (*)    All other components within normal limits  CBC - Abnormal; Notable for the following components:   MCV 102.0 (*)    All other components within normal limits  URINALYSIS, ROUTINE W REFLEX MICROSCOPIC - Abnormal; Notable for the following components:   APPearance HAZY (*)    Leukocytes,Ua LARGE (*)    Bacteria, UA FEW (*)    All other components within normal limits  LIPASE, BLOOD    EKG None  Radiology CT Head Wo Contrast  Result Date: 02/18/2019 CLINICAL DATA:  Nausea, throbbing headache and nervous feeling for 2 days. No reported injury. EXAM: CT HEAD WITHOUT CONTRAST TECHNIQUE: Contiguous axial images were obtained from the base of the skull through the vertex without intravenous contrast. COMPARISON:  Brain MRI from earlier today. FINDINGS: Brain: Extra-axial midline planum sphenoidale 2.3 x 2.2 cm mass (series 3/image 11), not substantially changed since 09/13/2015 brain MRI. No evidence of parenchymal hemorrhage or  extra-axial fluid collection. No new mass lesion, mass effect, or midline shift. No CT evidence of acute infarction. Cerebral volume is age appropriate. Nonspecific moderate subcortical and periventricular white matter hypodensity, most in keeping with chronic small vessel ischemic change. No ventriculomegaly. Vascular: No acute abnormality. Skull: No evidence of calvarial fracture. Sinuses/Orbits: No fluid levels. Mucoperiosteal thickening in the ethmoidal air cells bilaterally. Other:  The mastoid air cells are unopacified. IMPRESSION: 1. No evidence of acute intracranial abnormality. 2. Planum sphenoidale 2.3 cm meningioma, not substantially changed since 09/13/2015 brain MRI. 3. Moderate chronic small vessel ischemic changes in the cerebral white matter. 4. Mild paranasal sinusitis, probably chronic. Electronically Signed   By: Ilona Sorrel M.D.   On: 02/18/2019 19:19   MR ANGIO HEAD WO CONTRAST  Result Date: 02/18/2019 CLINICAL DATA:  85 year old female with dizziness, nausea, headache and ?feeling nervous? x2 days. EXAM: MRA HEAD WITHOUT CONTRAST TECHNIQUE: Angiographic images of the Circle of Willis were obtained using MRA technique without intravenous contrast. COMPARISON:  Truncated brain MRI today reported separately. Intracranial MRA 03/12/2011. FINDINGS: Antegrade flow in the posterior circulation appears stable since 2013 including dominant left vertebral artery. Both PICA origins remain patent. Vertebrobasilar tortuosity is stable. No distal vertebral or basilar artery stenosis. SCA and PCA origins remain patent. Bilateral PCAs appear stable and within normal limits. Posterior communicating arteries are diminutive or absent. Antegrade flow in both ICA siphons appears  stable, with artifactual signal loss in the petrous segments suspected due to susceptibility. Dominant left siphon as before due to dominant left and diminutive or absent right A1 segments. No suspected ICA siphon stenosis. Ophthalmic  artery origins remain normal. Stable MCA and ACA origins. Visible bilateral ACA branches are stable and within normal limits. Both MCA M1 segments and MCA bifurcations are patent. Visible bilateral MCA branches are grossly stable. IMPRESSION: Intracranial MRA appears stable since 2013 and negative for age. Electronically Signed   By: Genevie Ann M.D.   On: 02/18/2019 16:58   MR BRAIN WO CONTRAST  Result Date: 02/18/2019 CLINICAL DATA:  85 year old female with dizziness, nausea, headache and ?feeling nervous? x2 days. EXAM: MRI HEAD WITHOUT CONTRAST TECHNIQUE: Multiplanar, multiecho pulse sequences of the brain and surrounding structures were obtained without intravenous contrast. COMPARISON:  Brain MRI 09/13/2015 FINDINGS: The examination had to be discontinued prior to completion by patient request. Axial and coronal diffusion weighted imaging only was obtained but is of fairly good quality. No restricted diffusion or evidence of acute infarction. There is a chronic planum sphenoidale in angioma evident on series 5, image 78 today. This was roughly 23 mm diameter in 2017 and is grossly stable. No other intracranial mass lesion is evident. No midline shift or other intracranial mass effect. No ventriculomegaly. Several chronic infarcts in the left cerebellum are evident, and may have increased in number since 2017. Patchy periventricular white matter signal changes chronic. IMPRESSION: 1. Truncated exam by patient request, only diffusion-weighted imaging obtained. 2. No acute infarct identified. Chronic infarcts in the left cerebellum may have increased since 2017. 3. Grossly stable chronic planum sphenoidale meningioma, 23 mm diameter in 2017. Electronically Signed   By: Genevie Ann M.D.   On: 02/18/2019 16:54    Procedures Procedures (including critical care time)  Medications Ordered in ED Medications  sodium chloride flush (NS) 0.9 % injection 3 mL (3 mLs Intravenous Not Given 02/18/19 1607)  fentaNYL  (SUBLIMAZE) injection 100 mcg (100 mcg Intravenous Given 02/18/19 1715)  LORazepam (ATIVAN) injection 1 mg (1 mg Intravenous Given 02/18/19 1715)  sodium chloride 0.9 % bolus 1,000 mL (0 mLs Intravenous Stopped 02/18/19 2050)  promethazine (PHENERGAN) injection 12.5 mg (12.5 mg Intravenous Given 02/18/19 1928)  cephALEXin (KEFLEX) capsule 250 mg (250 mg Oral Given 02/18/19 2107)    ED Course  I have reviewed the triage vital signs and the nursing notes.  Pertinent labs & imaging results that were available during my care of the patient were reviewed by me and considered in my medical decision making (see chart for details).    MDM Rules/Calculators/A&P                      85 year old female who presents for evaluation of headache, dizziness, lightheadedness, gait abnormalities.  Headache has been ongoing for last 3 to 4 weeks but felt like worsened in the last week.  Also feels like she is veering towards her side whenever she walks.  Reports some associated lightheadedness/dizziness sensation.  No chest pain, fevers, vision changes.  On initially arrival, she is afebrile, nontoxic-appearing.  She is slightly hypertensive but vitals otherwise stable.  On exam, she has no cranial nerve deficits noted.  She has full strength of upper and lower extremities.  When I walk her, she initially had a staggered, slow gait.  When I bumped her up to a normal gait pace, she started veering towards the left and ran into the door.  No carotid bruit noted.  We will plan to check labs, imaging.   CMP shows BUN of 33, creatinine of 2.39.  Anion gap of 11.  CBC shows no leukocytosis or anemia.  Lipase is unremarkable.  UA shows large leukocytes, bacteria.  There is squamous epithelium.   Curbside consult with neuro PA recommends MRA of head and neck to evaluate for dissection and MRI for evaluation of stroke.  Given her GFR, they recommend MRA versus CTA.  RN inform me that patient cannot tolerate MRI.  They had to  stop the procedure early due to patient's request.  I discussed with patient on her return.  She was tearful and states that her headache was worse.  She felt like laying down in the MRI machine made the noise in her head worse and she had to stop.  We will plan to treat the headache.  I discussed with Dr. Roslynn Amble who independently evaluated patient after returning from MRI.  He recommends treating headache.  MRI has shown no acute stroke.  If patient can ambulate normally after medication, plan to have her follow-up with neurology.  Discussed patient with Dr. Cheral Marker (neuro).  He is reassured by MRI readings.  He agrees with plan that patient can ambulate, plan for outpatient follow-up.  MRI brain had to be discontinued at patient's request.  No acute infarct identified based on available images.  She has a stable meningioma that is about 23 mm in diameter that was seen in 2017.  Patient is resting comfortably on bed.  She reports improvement in headache.  She is getting fluids.  We will reassess and to come to a conclusion regarding dispo.  Reevaluation.  Patient reports improvement in headache.  Patient was able to get up out of bed by herself and dressed herself under my observation.  She did not have any difficulties.  I would personally watched her ambulate in the room.  She still had a slightly staggered walk but did not veer towards the left or run into anything.  No ataxia.  She states that the staggered walking has been there for a long time.  I do not see any evidence of her falling or grabbing toward something.  She was able to walk without any difficulty.  Patient states she is ready to go home and feels comfortable going home.  I discussed the results with her daughter via phone.  We will plan for ambulatory referral to neurology.  Additionally, instructed patient that she will need to follow-up with her primary care doctor to arrange an ultrasound of her carotids.. We will plan to treat for  UTI.  Antibiotics dosed according to her GFR. At this time, patient exhibits no emergent life-threatening condition that require further evaluation in ED or admission. Patient had ample opportunity for questions and discussion. All patient's questions were answered with full understanding. Strict return precautions discussed. Patient expresses understanding and agreement to plan.   Portions of this note were generated with [employer]. Dictation errors may occur despite best attempts at proofreading.  Final Clinical Impression(s) / ED Diagnoses Final diagnoses:  Acute cystitis without hematuria  Acute nonintractable headache, unspecified headache type    Rx / DC Orders ED Discharge Orders         Ordered    Ambulatory referral to Neurology    Comments: An appointment is requested in approximately: 1 week   02/18/19 2059    cephALEXin (KEFLEX) 250 MG capsule  3 times daily  02/18/19 2059           Volanda Napoleon, PA-C 02/18/19 2147    Lucrezia Starch, MD 02/19/19 [phone]

## 2019-02-18 NOTE — Progress Notes (Signed)
Brain and MRA head imaging obtained, pt refused further scanning. RN aware.

## 2019-02-20 ENCOUNTER — Other Ambulatory Visit: Payer: Self-pay

## 2019-02-20 ENCOUNTER — Telehealth: Payer: Self-pay

## 2019-02-20 ENCOUNTER — Ambulatory Visit (INDEPENDENT_AMBULATORY_CARE_PROVIDER_SITE_OTHER): Payer: Medicare Other | Admitting: Internal Medicine

## 2019-02-20 DIAGNOSIS — N390 Urinary tract infection, site not specified: Secondary | ICD-10-CM | POA: Diagnosis not present

## 2019-02-20 DIAGNOSIS — N3000 Acute cystitis without hematuria: Secondary | ICD-10-CM

## 2019-02-20 MED ORDER — AMOXICILLIN-POT CLAVULANATE 500-125 MG PO TABS: 1.0000 | ORAL_TABLET | Freq: Two times a day (BID) | ORAL | 0 refills | Status: DC

## 2019-02-20 NOTE — Telephone Encounter (Signed)
Question about meds, please call pt back.

## 2019-02-20 NOTE — Assessment & Plan Note (Signed)
Ms. Szymanowski presented to the ED on 02/18/19 with HA, dizziness, tinnitus. Initial work up reveal findings consistent with possible UTI, including + leukocytes and bacteria. She endorses worsened urinary frequency and urgency recently. She was prescribed Cephalexin 500 mg TID on discharge but it caused worsened HA as a side effect. She would like to switch to something else.   Per chart review, last urine culture in 2017 was not a clean catch. No urine culture obtained on 02/18/19. Will switch to Augmentin but renally dose (Crcl of 20).  Emphasized to call the clinic if symptoms do not improve or worsen.   Plan:  - Augmentin 500-125 mg BID x 3 days  - Discontinue Cephalexin

## 2019-02-20 NOTE — Telephone Encounter (Signed)
Returned call to patient. States she was seen in ED for ringing in the ears, was put into a machine (CT/MRI) but was unable to tolerate 2/2 noise causing H/A. States her daughter picked up med (cephalexin) which she has been taking 2 tabs prn. Was unaware that this was for UTI. States she was not told she had UTI and still needs to be treated for ringing in th ears and H/A. Placed on today's [facility] schedule for telehealth visit. Hubbard Hartshorn, BSN, RN-BC

## 2019-02-20 NOTE — Progress Notes (Signed)
[facility] Internal Medicine Residency Telephone Encounter Continuity Care Appointment  HPI:   This telephone encounter was created for [name] on 02/20/2019 for the following purpose/cc of headache and ringing of ears.  Mrrs. Diniz states today, she feels good and is not having any ringing in her ears. She is confused about why she was prescribed Cephalexin though. After taking two yesterday AM and two yesterday PM, she woke up in the night with severe HA that was pounding in nature. She did have coffee and saw on the paperwork from the ED that she was not suppose to. The HA was alleviated by heating pad eventually and she was able to sleep. She would like stop taking Cephalexin, as she is scared about the severity of HA that followed after taking two doses.   She endorses urinary frequency and urgency but denies dysuria, hematuria, foul smelling.    Past Medical History:  Past Medical History:  Diagnosis Date  . Anemia due to blood loss, chronic   . Anemia, iron deficiency   . Angiodysplasia of colon 05/09/2005   Single small angiodysplastic lesion in the descending colon found on colonoscopy 05/09/2005 by [name]  . Arthritis    "knots on my thumbs; left elbow" (03/03/2014)  . Cataracts, bilateral   . Chronic ankle pain    bilateral  . Chronic kidney disease, stage IV (severe) ([place])    [name] 02/09/2014  . Chronic knee pain    bilateral  . Chronic thumb pain   . Depression   . Epicondylitis   . Gastritis, Helicobacter pylori    Noted on EGD 01/18/2009 by [name];  biopsy showed chronic gastritis with Helicobacter pylori, treated by [name].  [name] GERD (gastroesophageal reflux disease)   . Gout   . Hemorrhoids, internal 05/09/2005    Found on colonoscopy 05/09/2005 by [name]  . History of blood transfusion    "low blood count"  . Hypertension   . Osteoporosis, unspecified 03/06/2012   A DXA bone density scan done 02/20/2012 showed  osteoporosis with a lumbar spine T-score of -4.4 and a right femur T-score of -2.8.  Patient has chronic kidney disease with estimated GFR less than 30, and it is not clear how much of her osteoporosis may be due to renal osteodystrophy.    . Pericardial effusion 11/2004   S/P subxiphoid pericardial window 11/01/2004 by [name].  A question of possible collagen vascular disease had been raised in 2006 due to arthralgias, a history of idiopathic pericarditis, and increased pigmentation of her palms, and she was referred to [name] but he was unable to confirm a rheumatologic diagnosis.  . Pinguecula   . Renal failure    Formerly on hemodialysis; managed by Dr. Corliss Parish  . Stiffness of joint, not elsewhere classified, hand   . Stroke [facility]) 2007   "when my kidneys went out & I was in a coma; weak LUE/LLE since"  . Thigh pain   . Vitreous detachment    "no OR"      ROS:   Endorses urinary frequency, urgency  Denies HA, dizziness, tinnitus, dysuria, hematuria,    Assessment / Plan / Recommendations:   Please see A&P under problem oriented charting for assessment of the patient's acute and chronic medical conditions.   As always, pt is advised that if symptoms worsen or new symptoms arise, they should go to an urgent care facility or to to ER for further evaluation.  Consent and Medical Decision Making:   Patient discussed with Dr. Philipp Ovens  This is a telephone encounter between [name] and Jose Persia on 02/20/2019 for UTI treatment. The visit was conducted with the patient located at home and Jose Persia at [facility]. The patient's identity was confirmed using their DOB and current address. The patient has consented to being evaluated through a telephone encounter and understands the associated risks (an examination cannot be done and the patient may need to come in for an appointment) / benefits (allows the patient to remain at home, decreasing  exposure to coronavirus). I personally spent 10 minutes on medical discussion.

## 2019-02-22 ENCOUNTER — Emergency Department (HOSPITAL_COMMUNITY)
Admission: EM | Admit: 2019-02-22 | Discharge: 2019-02-22 | Disposition: A | Discharge status: Home / Self Care | Payer: Medicare Other | Attending: Emergency Medicine | Admitting: Emergency Medicine

## 2019-02-22 ENCOUNTER — Other Ambulatory Visit: Payer: Self-pay

## 2019-02-22 ENCOUNTER — Encounter (HOSPITAL_COMMUNITY): Payer: Self-pay | Admitting: Emergency Medicine

## 2019-02-22 DIAGNOSIS — Z79899 Other long term (current) drug therapy: Secondary | ICD-10-CM | POA: Insufficient documentation

## 2019-02-22 DIAGNOSIS — Z87891 Personal history of nicotine dependence: Secondary | ICD-10-CM | POA: Diagnosis not present

## 2019-02-22 DIAGNOSIS — I129 Hypertensive chronic kidney disease with stage 1 through stage 4 chronic kidney disease, or unspecified chronic kidney disease: Secondary | ICD-10-CM | POA: Diagnosis not present

## 2019-02-22 DIAGNOSIS — N184 Chronic kidney disease, stage 4 (severe): Secondary | ICD-10-CM | POA: Diagnosis not present

## 2019-02-22 DIAGNOSIS — N3000 Acute cystitis without hematuria: Secondary | ICD-10-CM | POA: Diagnosis not present

## 2019-02-22 DIAGNOSIS — N186 End stage renal disease: Secondary | ICD-10-CM | POA: Diagnosis not present

## 2019-02-22 DIAGNOSIS — R339 Retention of urine, unspecified: Secondary | ICD-10-CM | POA: Diagnosis present

## 2019-02-22 LAB — BASIC METABOLIC PANEL
Anion gap: 13 (ref 5–15)
BUN: 37 mg/dL — ABNORMAL HIGH (ref 8–23)
CO2: 22 mmol/L (ref 22–32)
Calcium: 9.5 mg/dL (ref 8.9–10.3)
Chloride: 106 mmol/L (ref 98–111)
Creatinine, Ser: 2.75 mg/dL — ABNORMAL HIGH (ref 0.44–1.00)
GFR calc Af Amer: 18 mL/min — ABNORMAL LOW (ref >60)
GFR calc non Af Amer: 15 mL/min — ABNORMAL LOW (ref >60)
Glucose, Bld: 100 mg/dL — ABNORMAL HIGH (ref 70–99)
Potassium: 4.8 mmol/L (ref 3.5–5.1)
Sodium: 141 mmol/L (ref 135–145)

## 2019-02-22 LAB — CBC
HCT: 39.9 % (ref 36.0–46.0)
Hemoglobin: 12.4 g/dL (ref 12.0–15.0)
MCH: 31.6 pg (ref 26.0–34.0)
MCHC: 31.1 g/dL (ref 30.0–36.0)
MCV: 101.5 fL — ABNORMAL HIGH (ref 80.0–100.0)
Platelets: 198 10*3/uL (ref 150–400)
RBC: 3.93 MIL/uL (ref 3.87–5.11)
RDW: 13.8 % (ref 11.5–15.5)
WBC: 4.7 10*3/uL (ref 4.0–10.5)
nRBC: 0 % (ref 0.0–0.2)

## 2019-02-22 LAB — URINALYSIS, MICROSCOPIC (REFLEX): WBC, UA: >50 WBC/hpf (ref 0–5)

## 2019-02-22 LAB — URINALYSIS, ROUTINE W REFLEX MICROSCOPIC
Bilirubin Urine: NEGATIVE
Glucose, UA: NEGATIVE mg/dL
Ketones, ur: NEGATIVE mg/dL
Nitrite: NEGATIVE
Protein, ur: 30 mg/dL — AB
Specific Gravity, Urine: 1.025 (ref 1.005–1.030)
pH: 6 (ref 5.0–8.0)

## 2019-02-22 MED ORDER — CIPROFLOXACIN HCL 500 MG PO TABS
500.0000 mg | ORAL_TABLET | Freq: Once | ORAL | Status: AC
  Administered 2019-02-22: 500 mg via ORAL
  Filled 2019-02-22: qty 1

## 2019-02-22 MED ORDER — CIPROFLOXACIN HCL 500 MG PO TABS: 500.0000 mg | ORAL_TABLET | Freq: Every day | ORAL | 0 refills | Status: DC

## 2019-02-22 MED ORDER — SODIUM CHLORIDE 0.9 % IV SOLN: Freq: Once | INTRAVENOUS | Status: DC

## 2019-02-22 NOTE — ED Notes (Signed)
Bladder scan in triage shows 239ml.

## 2019-02-22 NOTE — ED Notes (Signed)
Pt was able to void 247ml of urine.  RN bladder scanned pt - 79ml found.  Provider aware.  Gave drink per provider

## 2019-02-22 NOTE — ED Triage Notes (Signed)
Patient states she was recently seen and sent home on medications. Patient now having difficulty voiding. States she has the sensation but only voiding a small amount.

## 2019-02-22 NOTE — ED Provider Notes (Signed)
[facility] EMERGENCY DEPARTMENT Provider Note   CSN: [id number] Arrival date & time: 02/22/19  1624     History Chief Complaint  Patient presents with  . Urinary Retention    [name] is a 85 y.o. female.  Pt presents to the ED today with difficulty urinating.  The pt was seen in the ED on 2/17 and dx with cystitis.  She was d/c on keflex.  She said she hallucinated during the night and called her doctor.  The pt stopped keflex and was started on Augmentin.  Pt said she still feels like she is not urinating properly.  Bladder scan in triage showed 250 cc of urine.  No cx done on urine.        Past Medical History:  Diagnosis Date  . Anemia due to blood loss, chronic   . Anemia, iron deficiency   . Angiodysplasia of colon 05/09/2005   Single small angiodysplastic lesion in the descending colon found on colonoscopy 05/09/2005 by [name]  . Arthritis    "knots on my thumbs; left elbow" (03/03/2014)  . Cataracts, bilateral   . Chronic ankle pain    bilateral  . Chronic kidney disease, stage IV (severe) ([place])    [name] 02/09/2014  . Chronic knee pain    bilateral  . Chronic thumb pain   . Depression   . Epicondylitis   . Gastritis, Helicobacter pylori    Noted on EGD 01/18/2009 by [name];  biopsy showed chronic gastritis with Helicobacter pylori, treated by [name].  [name] GERD (gastroesophageal reflux disease)   . Gout   . Hemorrhoids, internal 05/09/2005    Found on colonoscopy 05/09/2005 by [name]  . History of blood transfusion    "low blood count"  . Hypertension   . Osteoporosis, unspecified 03/06/2012   A DXA bone density scan done 02/20/2012 showed osteoporosis with a lumbar spine T-score of -4.4 and a right femur T-score of -2.8.  Patient has chronic kidney disease with estimated GFR less than 30, and it is not clear how much of her osteoporosis may be due to renal osteodystrophy.    . Pericardial effusion 11/2004     S/P subxiphoid pericardial window 11/01/2004 by [name].  A question of possible collagen vascular disease had been raised in 2006 due to arthralgias, a history of idiopathic pericarditis, and increased pigmentation of her palms, and she was referred to [name] but he was unable to confirm a rheumatologic diagnosis.  . Pinguecula   . Renal failure    Formerly on hemodialysis; managed by Dr. Corliss Parish  . Stiffness of joint, not elsewhere classified, hand   . Stroke [facility]) 2007   "when my kidneys went out & I was in a coma; weak LUE/LLE since"  . Thigh pain   . Vitreous detachment    "no OR"    Patient Active Problem List   Diagnosis Date Noted  . Acute pancreatitis 05/24/2018  . Diverticulitis of large intestine without perforation or abscess without bleeding 05/21/2018  . Subjective tinnitus of both ears 01/24/2018  . Chest pain, non-cardiac 05/30/2017  . Benign positional vertigo 09/21/2016  . Preventative health care 09/11/2016  . Vaginal mass 07/19/2016  . Insomnia 05/03/2015  . GERD (gastroesophageal reflux disease) 05/03/2015  . Fatigue 04/20/2015  . Meningioma ([place]) 03/01/2015  . History of CVA (cerebrovascular accident) 01/24/2015  . Gout 03/03/2014  . Hearing loss 11/03/2013  . Osteoporosis 03/06/2012  .  UTI (urinary tract infection) 03/21/2011  . Depression 08/30/2009  . Iron deficiency anemia 06/01/2009  . Disease of pericardium 06/01/2009  . CKD (chronic kidney disease) stage 4, GFR 15-29 ml/min (HCC) 06/01/2009  . CATARACTS 10/24/2005  . Essential hypertension 10/24/2005  . ANGIODYSPLASIA, COLON 05/09/2005    Past Surgical History:  Procedure Laterality Date  . ABDOMINAL HYSTERECTOMY  1960's  . ARTERIOVENOUS GRAFT PLACEMENT Left 11/28/2005   forearm arteriovenous  . CARDIAC CATHETERIZATION  01/2005   [name] 05/15/2010  . HALLUX VALGUS CORRECTION Left 01/2012   foot  . SUBXYPHOID PERICARDIAL WINDOW  11/01/2004  . THROMBECTOMY  Left  03/06/2006   Simple thrombectomy arteriovenous Gore-Tex graft, left arm, with exploration of venous end and intraoperative shuntogram.         . THROMBECTOMY / ARTERIOVENOUS GRAFT REVISION Left 01/21/2006   Thrombectomy and revision of left forearm loop AV graft.  . TUBAL LIGATION  1960's   "before hysterectomy"     OB History   No obstetric history on file.     Family History  Problem Relation Age of Onset  . Hypertension Brother   . Heart disease Father   . Diabetes Brother   . Breast cancer Neg Hx   . Colon cancer Neg Hx   . Lung cancer Neg Hx   . Cervical cancer Neg Hx   . Sickle cell anemia Neg Hx     Social History   Tobacco Use  . Smoking status: Former Smoker    Packs/day: 0.10    Years: 1.00    Pack years: 0.10    Types: Cigarettes    Quit date: 01/02/1960    Years since quitting: 59.1  . Smokeless tobacco: Never Used  . Tobacco comment: patient stated has never smoked   Substance Use Topics  . Alcohol use: No    Alcohol/week: 0.0 standard drinks  . Drug use: No    Home Medications Prior to Admission medications   Medication Sig Start Date End Date Taking? Authorizing Provider  allopurinol (ZYLOPRIM) 100 MG tablet Take 1.5 tablets (150 mg total) by mouth daily. 01/14/18   [name], MD  amLODipine (NORVASC) 10 MG tablet TAKE 1 TABLET BY MOUTH EVERY NIGHT AT BEDTIME 07/16/18   [name], MD  amoxicillin-clavulanate (AUGMENTIN) 500-125 MG tablet Take 1 tablet (500 mg total) by mouth 2 (two) times daily. 02/20/19   Jose Persia, MD  buPROPion (WELLBUTRIN XL) 300 MG 24 hr tablet TAKE 1 TABLET(300 MG) BY MOUTH DAILY Patient taking differently: Take 300 mg by mouth daily.  04/03/18   [name], MD  ciprofloxacin (CIPRO) 500 MG tablet Take 1 tablet (500 mg total) by mouth daily. 02/22/19   Isla Pence, MD  famotidine (PEPCID) 20 MG tablet TAKE 1 TABLET BY MOUTH TWICE DAILY 10/14/18   [name], MD  furosemide (LASIX) 80 MG  tablet TAKE 1/2 TABLET(40 MG) BY MOUTH DAILY 06/10/18   [name], MD  HYDROcodone-acetaminophen (NORCO/VICODIN) 5-325 MG tablet Take 1-2 tablets by mouth every 6 (six) hours as needed for moderate pain or severe pain. 07/07/18   Charlesetta Shanks, MD  Melatonin 5 MG CAPS Take 1 capsule (5 mg total) by mouth at bedtime. 05/21/18   Lorella Nimrod, MD  metoprolol tartrate (LOPRESSOR) 25 MG tablet Take 0.5 tablets (12.5 mg total) by mouth 2 (two) times daily. 05/21/18   Lorella Nimrod, MD  Multiple Vitamins-Minerals (MULTIVITAMIN WITH MINERALS) tablet Take 1 tablet by mouth daily. Centrum Silver    [provider]  omeprazole (PRILOSEC) 20 MG capsule Take 1 capsule (20 mg total) by mouth 2 (two) times daily. 02/05/18   [name], MD  ondansetron (ZOFRAN ODT) 4 MG disintegrating tablet Take 1 tablet (4 mg total) by mouth every 4 (four) hours as needed for nausea or vomiting. 07/07/18   Charlesetta Shanks, MD  SENSIPAR 30 MG tablet TAKE 1 TABLET BY MOUTH ONCE DAILY Patient taking differently: Take 30 mg by mouth daily with breakfast.  10/02/16   [name], MD  FLUoxetine (PROZAC) 10 MG tablet Take 1 tablet (10 mg total) by mouth daily. 03/07/11 06/02/18  [name], MD    Allergies    Ibuprofen  Review of Systems   Review of Systems  Genitourinary: Positive for difficulty urinating and frequency.  All other systems reviewed and are negative.   Physical Exam Updated Vital Signs BP (!) 154/93   Pulse 78   Temp (!) 97.4 F (36.3 C) (Oral)   Resp 17   Ht 5\' 2"  (1.575 m)   Wt 70.8 kg   SpO2 100%   BMI 28.53 kg/m   Physical Exam Vitals and nursing note reviewed.  Constitutional:      Appearance: Normal appearance.  HENT:     Head: Normocephalic and atraumatic.     Right Ear: External ear normal.     Left Ear: External ear normal.     Nose: Nose normal.     Mouth/Throat:     Mouth: Mucous membranes are moist.     Pharynx: Oropharynx is clear.  Eyes:     Extraocular  Movements: Extraocular movements intact.     Conjunctiva/sclera: Conjunctivae normal.     Pupils: Pupils are equal, round, and reactive to light.  Cardiovascular:     Rate and Rhythm: Normal rate and regular rhythm.     Pulses: Normal pulses.     Heart sounds: Normal heart sounds.  Pulmonary:     Effort: Pulmonary effort is normal.  Abdominal:     General: Abdomen is flat. Bowel sounds are normal.     Palpations: Abdomen is soft.  Musculoskeletal:        General: Normal range of motion.     Cervical back: Normal range of motion and neck supple.  Skin:    General: Skin is warm.     Capillary Refill: Capillary refill takes less than 2 seconds.  Neurological:     General: No focal deficit present.     Mental Status: She is alert and oriented to person, place, and time.  Psychiatric:        Mood and Affect: Mood normal.        Behavior: Behavior normal.        Thought Content: Thought content normal.        Judgment: Judgment normal.     ED Results / Procedures / Treatments   Labs (all labs ordered are listed, but only abnormal results are displayed) Labs Reviewed  URINALYSIS, ROUTINE W REFLEX MICROSCOPIC - Abnormal; Notable for the following components:      Result Value   APPearance CLOUDY (*)    Hgb urine dipstick TRACE (*)    Protein, ur 30 (*)    Leukocytes,Ua MODERATE (*)    All other components within normal limits  BASIC METABOLIC PANEL - Abnormal; Notable for the following components:   Glucose, Bld 100 (*)    BUN 37 (*)    Creatinine, Ser 2.75 (*)    GFR calc non Af Amer 15 (*)  GFR calc Af Amer 18 (*)    All other components within normal limits  CBC - Abnormal; Notable for the following components:   MCV 101.5 (*)    All other components within normal limits  URINALYSIS, MICROSCOPIC (REFLEX) - Abnormal; Notable for the following components:   Bacteria, UA RARE (*)    All other components within normal limits  URINE CULTURE     EKG None  Radiology No results found.  Procedures Procedures (including critical care time)  Medications Ordered in ED Medications  0.9 %  sodium chloride infusion (has no administration in time range)  ciprofloxacin (CIPRO) tablet 500 mg (has no administration in time range)    ED Course  I have reviewed the triage vital signs and the nursing notes.  Pertinent labs & imaging results that were available during my care of the patient were reviewed by me and considered in my medical decision making (see chart for details).    MDM Rules/Calculators/A&P                      Pt was able to urinate on her own.  Bladder scan post void showed no residual urine.  Pt still has a UTI, but she has not had a full course of abx.  I sent urine for cx.  GFR only 18, so she is put on 500 mg every 24 hrs.  Pt feels good and is ready to go home.  REturn if worse.  Final Clinical Impression(s) / ED Diagnoses Final diagnoses:  Acute cystitis without hematuria  CKD (chronic kidney disease) stage 4, GFR 15-29 ml/min (HCC)    Rx / DC Orders ED Discharge Orders         Ordered    ciprofloxacin (CIPRO) 500 MG tablet  Daily     02/22/19 2044           Isla Pence, MD 02/22/19 2044

## 2019-02-23 ENCOUNTER — Telehealth: Payer: Self-pay | Admitting: *Deleted

## 2019-02-23 LAB — URINE CULTURE: Culture: NO GROWTH

## 2019-02-23 NOTE — Progress Notes (Signed)
Internal Medicine Clinic Attending  Case discussed with Dr. Basaraba at the time of the visit.  We reviewed the resident's history and exam and pertinent patient test results.  I agree with the assessment, diagnosis, and plan of care documented in the resident's note.    

## 2019-02-23 NOTE — Telephone Encounter (Signed)
Reviewed pt visit

## 2019-03-09 ENCOUNTER — Encounter: Payer: Self-pay | Admitting: Internal Medicine

## 2019-03-10 ENCOUNTER — Ambulatory Visit (INDEPENDENT_AMBULATORY_CARE_PROVIDER_SITE_OTHER): Payer: Medicare HMO | Admitting: Internal Medicine

## 2019-03-10 ENCOUNTER — Other Ambulatory Visit: Payer: Self-pay

## 2019-03-10 ENCOUNTER — Encounter: Payer: Self-pay | Admitting: Internal Medicine

## 2019-03-10 VITALS — BP 137/78 | HR 77 | Temp 97.7°F | Ht 62.0 in | Wt 148.3 lb

## 2019-03-10 DIAGNOSIS — K219 Gastro-esophageal reflux disease without esophagitis: Secondary | ICD-10-CM

## 2019-03-10 DIAGNOSIS — D509 Iron deficiency anemia, unspecified: Secondary | ICD-10-CM | POA: Diagnosis not present

## 2019-03-10 DIAGNOSIS — D329 Benign neoplasm of meninges, unspecified: Secondary | ICD-10-CM

## 2019-03-10 DIAGNOSIS — Z79899 Other long term (current) drug therapy: Secondary | ICD-10-CM

## 2019-03-10 DIAGNOSIS — G4700 Insomnia, unspecified: Secondary | ICD-10-CM

## 2019-03-10 DIAGNOSIS — Z0000 Encounter for general adult medical examination without abnormal findings: Secondary | ICD-10-CM

## 2019-03-10 DIAGNOSIS — H9313 Tinnitus, bilateral: Secondary | ICD-10-CM | POA: Diagnosis not present

## 2019-03-10 DIAGNOSIS — N3000 Acute cystitis without hematuria: Secondary | ICD-10-CM

## 2019-03-10 DIAGNOSIS — I129 Hypertensive chronic kidney disease with stage 1 through stage 4 chronic kidney disease, or unspecified chronic kidney disease: Secondary | ICD-10-CM | POA: Diagnosis not present

## 2019-03-10 DIAGNOSIS — N184 Chronic kidney disease, stage 4 (severe): Secondary | ICD-10-CM

## 2019-03-10 DIAGNOSIS — M1A30X0 Chronic gout due to renal impairment, unspecified site, without tophus (tophi): Secondary | ICD-10-CM

## 2019-03-10 DIAGNOSIS — Z2821 Immunization not carried out because of patient refusal: Secondary | ICD-10-CM

## 2019-03-10 DIAGNOSIS — Z87440 Personal history of urinary (tract) infections: Secondary | ICD-10-CM

## 2019-03-10 DIAGNOSIS — M109 Gout, unspecified: Secondary | ICD-10-CM

## 2019-03-10 DIAGNOSIS — H9312 Tinnitus, left ear: Secondary | ICD-10-CM

## 2019-03-10 DIAGNOSIS — F32A Depression, unspecified: Secondary | ICD-10-CM

## 2019-03-10 DIAGNOSIS — F329 Major depressive disorder, single episode, unspecified: Secondary | ICD-10-CM

## 2019-03-10 DIAGNOSIS — I10 Essential (primary) hypertension: Secondary | ICD-10-CM

## 2019-03-10 MED ORDER — ALLOPURINOL 100 MG PO TABS: 150.0000 mg | ORAL_TABLET | Freq: Every day | ORAL | 1 refills | Status: DC

## 2019-03-10 NOTE — Assessment & Plan Note (Signed)
-  Patient continues to refuse pneumonia and flu vaccines -We had a long discussion about the Covid vaccine.  I explained to the patient that she is at high risk for severe Covid if she gets infected.  Patient expresses understanding of this but states that she does not want to take the vaccine at this time but that she will think about this. -We will readdress this at her follow-up visit as well

## 2019-03-10 NOTE — Patient Instructions (Addendum)
-  It was a pleasure seeing you today -I will refer you to the ENT doctor for the ringing in your ears -I will also refer you back to Dr. Christella Noa (neurosurgeon) for follow-up of your meningioma -Please follow-up with me in 3 months for some blood work -I have put in a refill for your allopurinol to your pharmacy.  Please call me if you have any issues obtaining medications -Please call me if you have any questions or concerns -If you need more information on the Covid vaccine please give our office a call and we will be able to provide you with information on this

## 2019-03-10 NOTE — Assessment & Plan Note (Signed)
-  This problem is chronic and stable -Patient states that she has not followed up with a neurosurgeon for this last year.  She will need a referral to follow-up with Dr. Christella Noa -I have placed this referral for her today. -No further work-up at this time

## 2019-03-10 NOTE — Assessment & Plan Note (Signed)
-  This problem is chronic and stable -Patient baseline creatinine appears to be 2.3-2.7 -Patient's creatinine at her ED visit at the end of February was 2.75 -Patient follows up with nephrology yearly for this and will follow up with them later this year -We will recheck her BMP at her follow-up visit -Patient denies any urinary complaints currently -No further work-up at this time

## 2019-03-10 NOTE — Assessment & Plan Note (Signed)
-  This problem is chronic and stable -Patient has not had any recent gout exacerbations -We will refill her allopurinol 150 mg once daily today -We will recheck uric acid level at her next visit -No further work-up at this time

## 2019-03-10 NOTE — Assessment & Plan Note (Signed)
-  This problem is chronic and stable -Patient states that her symptoms are well controlled on bupropion -Her PHQ 9 score today is 6 down from 11 when we last checked it -No further work-up at this time -We will continue with current regimen for now

## 2019-03-10 NOTE — Assessment & Plan Note (Signed)
-  Patient has a history of iron deficiency anemia but has had normal hemoglobin on her last couple of CBCs -She was noted to have a normal hemoglobin at her recent ED visit last month but she was noted to have macrocytosis -We will recheck her CBC at her follow-up visit and check a M87 and folic acid level as well given her macrocytosis -No further work-up at this time

## 2019-03-10 NOTE — Progress Notes (Signed)
   Subjective:    Patient ID: [name], female    DOB: 15-Jun-1934, 85 y.o.   MRN: [id number]  HPI  I have seen and examined this patient.  Patient is here for routine follow-up of her hypertension and CKD.  Patient states that she feels well today.  She did have a recent urinary tract infection which is now resolved.  Patient does complain of intermittent episodes of buzzing in her ears especially in the night when it is quiet.  She denies any other complaints at this time and states that she is compliant with all her medications.  Review of Systems  Constitutional: Negative.   HENT: Positive for tinnitus.   Respiratory: Negative.   Cardiovascular: Negative.   Gastrointestinal: Negative.   Genitourinary: Negative.  Negative for dysuria and urgency.  Musculoskeletal: Negative.   Neurological: Negative.   Psychiatric/Behavioral: Negative.        Objective:   Physical Exam Constitutional:      Appearance: Normal appearance.  HENT:     Head: Normocephalic and atraumatic.     Right Ear: Tympanic membrane and ear canal normal.     Left Ear: Tympanic membrane and ear canal normal.  Cardiovascular:     Rate and Rhythm: Normal rate and regular rhythm.     Heart sounds: Normal heart sounds.  Pulmonary:     Effort: Pulmonary effort is normal.     Breath sounds: Normal breath sounds. No wheezing or rales.  Abdominal:     General: Bowel sounds are normal. There is no distension.     Palpations: Abdomen is soft.     Tenderness: There is no abdominal tenderness.  Musculoskeletal:        General: No swelling or tenderness.     Cervical back: Neck supple. No tenderness.  Lymphadenopathy:     Cervical: No cervical adenopathy.  Skin:    General: Skin is warm and dry.     Findings: No rash.  Neurological:     General: No focal deficit present.     Mental Status: She is alert and oriented to person, place, and time.  Psychiatric:        Mood and Affect: Mood normal.      Behavior: Behavior normal.           Assessment & Plan:  Please see problem based charting for assessment and plan:

## 2019-03-10 NOTE — Assessment & Plan Note (Signed)
-  Patient states that she has "buzzing" in her head and she hears this especially when it is quiet at night -Patient did have a small quantity of wax in her left ear canal but tympanic membrane on both sides appear to be normal -We will refer patient to ENT for further evaluation -Patient does have a history of tinnitus and did see ENT back in 2017 for this but has not followed up since -No further work-up at this time

## 2019-03-10 NOTE — Assessment & Plan Note (Signed)
BP Readings from Last 3 Encounters:  03/10/19 137/78  02/22/19 (!) 148/82  02/18/19 (!) 162/82    Lab Results  Component Value Date   NA 141 02/22/2019   K 4.8 02/22/2019   CREATININE 2.75 (H) 02/22/2019    Assessment: Blood pressure control:  Well-controlled Progress toward BP goal:   At goal Comments: Patient is compliant with Lasix 40 mg daily as well as Lopressor 12.5 milligrams twice daily and amlodipine 10 mg daily  Plan: Medications:  continue current medications Educational resources provided:   Self management tools provided:   Other plans: We will check BMP at her follow-up visit.

## 2019-03-10 NOTE — Assessment & Plan Note (Signed)
-  Patient states that she has been sleeping well at home and has not been taking melatonin or any other sleep aid currently -No further work-up at this time -We will remove melatonin from her medication list

## 2019-03-10 NOTE — Assessment & Plan Note (Signed)
-  Patient was diagnosed with a urinary tract infection last month -She was initially prescribed cephalexin but she stopped this because of a headache.  She is switched over to Augmentin for this but still has persistent symptoms and was seen in the emergency room and had her antibiotic switched to ciprofloxacin -Patient completed her course of ciprofloxacin and has no residual symptoms currently -Her urine culture was negative at her last ED visit -No further work-up at this time

## 2019-03-10 NOTE — Assessment & Plan Note (Signed)
-  This problem is chronic and stable -At our last visit we talked about patient staying on either omeprazole or Pepcid but it appears that she has both on her list and that she has been taking both these medications. -I have removed Pepcid from her medication list and taken away this bottle -Patient will continue with omeprazole 20 mg daily -No further work-up at this time

## 2019-03-12 ENCOUNTER — Emergency Department (HOSPITAL_COMMUNITY)
Admission: EM | Admit: 2019-03-12 | Discharge: 2019-03-13 | Disposition: A | Discharge status: Home / Self Care | Payer: Medicare HMO | Attending: Emergency Medicine | Admitting: Emergency Medicine

## 2019-03-12 ENCOUNTER — Encounter (HOSPITAL_COMMUNITY): Payer: Self-pay

## 2019-03-12 ENCOUNTER — Emergency Department (HOSPITAL_COMMUNITY): Payer: Medicare HMO

## 2019-03-12 ENCOUNTER — Other Ambulatory Visit: Payer: Self-pay

## 2019-03-12 DIAGNOSIS — R2681 Unsteadiness on feet: Secondary | ICD-10-CM | POA: Diagnosis not present

## 2019-03-12 DIAGNOSIS — R42 Dizziness and giddiness: Secondary | ICD-10-CM | POA: Insufficient documentation

## 2019-03-12 DIAGNOSIS — N289 Disorder of kidney and ureter, unspecified: Secondary | ICD-10-CM | POA: Insufficient documentation

## 2019-03-12 DIAGNOSIS — H9313 Tinnitus, bilateral: Secondary | ICD-10-CM | POA: Diagnosis present

## 2019-03-12 DIAGNOSIS — N184 Chronic kidney disease, stage 4 (severe): Secondary | ICD-10-CM | POA: Diagnosis not present

## 2019-03-12 DIAGNOSIS — I129 Hypertensive chronic kidney disease with stage 1 through stage 4 chronic kidney disease, or unspecified chronic kidney disease: Secondary | ICD-10-CM | POA: Insufficient documentation

## 2019-03-12 DIAGNOSIS — Z79899 Other long term (current) drug therapy: Secondary | ICD-10-CM | POA: Insufficient documentation

## 2019-03-12 LAB — COMPREHENSIVE METABOLIC PANEL
ALT: 19 U/L (ref 0–44)
AST: 22 U/L (ref 15–41)
Albumin: 4 g/dL (ref 3.5–5.0)
Alkaline Phosphatase: 151 U/L — ABNORMAL HIGH (ref 38–126)
Anion gap: 12 (ref 5–15)
BUN: 53 mg/dL — ABNORMAL HIGH (ref 8–23)
CO2: 24 mmol/L (ref 22–32)
Calcium: 9.3 mg/dL (ref 8.9–10.3)
Chloride: 101 mmol/L (ref 98–111)
Creatinine, Ser: 3.06 mg/dL — ABNORMAL HIGH (ref 0.44–1.00)
GFR calc Af Amer: 16 mL/min — ABNORMAL LOW (ref >60)
GFR calc non Af Amer: 14 mL/min — ABNORMAL LOW (ref >60)
Glucose, Bld: 95 mg/dL (ref 70–99)
Potassium: 4.7 mmol/L (ref 3.5–5.1)
Sodium: 137 mmol/L (ref 135–145)
Total Bilirubin: 0.5 mg/dL (ref 0.3–1.2)
Total Protein: 7.1 g/dL (ref 6.5–8.1)

## 2019-03-12 LAB — I-STAT CHEM 8, ED
BUN: 49 mg/dL — ABNORMAL HIGH (ref 8–23)
Calcium, Ion: 1.12 mmol/L — ABNORMAL LOW (ref 1.15–1.40)
Chloride: 103 mmol/L (ref 98–111)
Creatinine, Ser: 3.4 mg/dL — ABNORMAL HIGH (ref 0.44–1.00)
Glucose, Bld: 90 mg/dL (ref 70–99)
HCT: 38 % (ref 36.0–46.0)
Hemoglobin: 12.9 g/dL (ref 12.0–15.0)
Potassium: 4.5 mmol/L (ref 3.5–5.1)
Sodium: 137 mmol/L (ref 135–145)
TCO2: 26 mmol/L (ref 22–32)

## 2019-03-12 LAB — DIFFERENTIAL
Abs Immature Granulocytes: 0.01 10*3/uL (ref 0.00–0.07)
Basophils Absolute: 0 10*3/uL (ref 0.0–0.1)
Basophils Relative: 1 %
Eosinophils Absolute: 0.3 10*3/uL (ref 0.0–0.5)
Eosinophils Relative: 7 %
Immature Granulocytes: 0 %
Lymphocytes Relative: 40 %
Lymphs Abs: 1.9 10*3/uL (ref 0.7–4.0)
Monocytes Absolute: 0.5 10*3/uL (ref 0.1–1.0)
Monocytes Relative: 10 %
Neutro Abs: 2 10*3/uL (ref 1.7–7.7)
Neutrophils Relative %: 42 %

## 2019-03-12 LAB — CBC
HCT: 37.5 % (ref 36.0–46.0)
Hemoglobin: 11.9 g/dL — ABNORMAL LOW (ref 12.0–15.0)
MCH: 31.6 pg (ref 26.0–34.0)
MCHC: 31.7 g/dL (ref 30.0–36.0)
MCV: 99.5 fL (ref 80.0–100.0)
Platelets: 208 10*3/uL (ref 150–400)
RBC: 3.77 MIL/uL — ABNORMAL LOW (ref 3.87–5.11)
RDW: 13.6 % (ref 11.5–15.5)
WBC: 4.7 10*3/uL (ref 4.0–10.5)
nRBC: 0 % (ref 0.0–0.2)

## 2019-03-12 LAB — APTT: aPTT: 28 seconds (ref 24–36)

## 2019-03-12 LAB — PROTIME-INR
INR: 1.1 (ref 0.8–1.2)
Prothrombin Time: 13.8 seconds (ref 11.4–15.2)

## 2019-03-12 MED ORDER — SODIUM CHLORIDE 0.9% FLUSH
3.0000 mL | Freq: Once | INTRAVENOUS | Status: AC
Start: 2019-03-12 — End: 2019-03-13
  Administered 2019-03-13: 3 mL via INTRAVENOUS

## 2019-03-12 NOTE — ED Triage Notes (Signed)
Pt arrives via POV w/ c/o of tinnitus x 2 weeks. Pt reports dizziness and loss of balance, but denies falls, or LOC. Neuro intact, NIHSS 0. VSS.

## 2019-03-13 ENCOUNTER — Emergency Department (HOSPITAL_COMMUNITY): Payer: Medicare HMO

## 2019-03-13 DIAGNOSIS — H9313 Tinnitus, bilateral: Secondary | ICD-10-CM | POA: Diagnosis not present

## 2019-03-13 LAB — CBG MONITORING, ED: Glucose-Capillary: 91 mg/dL (ref 70–99)

## 2019-03-13 MED ORDER — LORAZEPAM 2 MG/ML IJ SOLN
1.0000 mg | Freq: Once | INTRAMUSCULAR | Status: AC
  Administered 2019-03-13: 04:00:00 1 mg via INTRAVENOUS
  Filled 2019-03-13: qty 1

## 2019-03-13 MED ORDER — LIPOFLAVONOID PO TABS: 1.0000 | ORAL_TABLET | Freq: Three times a day (TID) | ORAL | 0 refills | Status: DC | PRN

## 2019-03-13 NOTE — ED Notes (Signed)
Pt resting comfortably, lights dimmed for comfort

## 2019-03-13 NOTE — Discharge Instructions (Signed)
Your MRI did not show the cause of the buzzing in your ears. Please follow up with the ENT office for further evaluation and treatment.   I have prescribed the medicine you took last time for the buzzing in your ears.

## 2019-03-13 NOTE — ED Notes (Signed)
ED Provider at bedside discussing necessity of MRI with pt

## 2019-03-13 NOTE — ED Notes (Signed)
Pt given 1mg  ativan IV prior to MRI, transported via stretcher.

## 2019-03-13 NOTE — ED Notes (Signed)
Pt given warm blanket for comfort, lights dimmed per request. No other complaints or needs at this time

## 2019-03-13 NOTE — ED Notes (Signed)
Pt returned from CT via stretcher, VSS

## 2019-03-13 NOTE — ED Provider Notes (Signed)
[facility] EMERGENCY DEPARTMENT Provider Note   CSN: [id number] Arrival date & time: 03/12/19  2117   History Chief Complaint  Patient presents with  . Tinnitus    [name] is a 85 y.o. female.  The history is provided by the patient.  She has history of hypertension, chronic kidney disease, stroke and comes in because of buzzing in her ears for the last 2 weeks which is getting worse.  She is unable to sleep because of it.  She has also noted decrease in her hearing.  She does wear hearing aids.  She has noted that she is off balance, but has not fallen.  She denies pain and denies fever or chills.  She denies headache or ear pain.  Past Medical History:  Diagnosis Date  . Anemia due to blood loss, chronic   . Anemia, iron deficiency   . Angiodysplasia of colon 05/09/2005   Single small angiodysplastic lesion in the descending colon found on colonoscopy 05/09/2005 by [name]  . Arthritis    "knots on my thumbs; left elbow" (03/03/2014)  . Cataracts, bilateral   . Chest pain, non-cardiac 05/30/2017  . Chronic ankle pain    bilateral  . Chronic kidney disease, stage IV (severe) ([place])    [name] 02/09/2014  . Chronic knee pain    bilateral  . Chronic thumb pain   . Depression   . Diverticulitis of large intestine without perforation or abscess without bleeding 05/21/2018  . Ear noise 01/07/2013  . Epicondylitis   . Gastritis, Helicobacter pylori    Noted on EGD 01/18/2009 by [name];  biopsy showed chronic gastritis with Helicobacter pylori, treated by [name].  [name] GERD (gastroesophageal reflux disease)   . Gout   . Hemorrhoids, internal 05/09/2005    Found on colonoscopy 05/09/2005 by [name]  . History of blood transfusion    "low blood count"  . Hypertension   . Osteoporosis, unspecified 03/06/2012   A DXA bone density scan done 02/20/2012 showed osteoporosis with a lumbar spine T-score of -4.4 and a right femur T-score of  -2.8.  Patient has chronic kidney disease with estimated GFR less than 30, and it is not clear how much of her osteoporosis may be due to renal osteodystrophy.    . Pericardial effusion 11/2004   S/P subxiphoid pericardial window 11/01/2004 by [name].  A question of possible collagen vascular disease had been raised in 2006 due to arthralgias, a history of idiopathic pericarditis, and increased pigmentation of her palms, and she was referred to [name] but he was unable to confirm a rheumatologic diagnosis.  . Pinguecula   . Renal failure    Formerly on hemodialysis; managed by Dr. Corliss Parish  . Stiffness of joint, not elsewhere classified, hand   . Stroke [facility]) 2007   "when my kidneys went out & I was in a coma; weak LUE/LLE since"  . Subjective tinnitus of both ears 01/24/2018  . Thigh pain   . Vitreous detachment    "no OR"    Patient Active Problem List   Diagnosis Date Noted  . Benign positional vertigo 09/21/2016  . Preventative health care 09/11/2016  . Vaginal mass 07/19/2016  . Insomnia 05/03/2015  . GERD (gastroesophageal reflux disease) 05/03/2015  . Fatigue 04/20/2015  . Meningioma ([place]) 03/01/2015  . Tinnitus 01/24/2015  . History of CVA (cerebrovascular accident) 01/24/2015  . Gout 03/03/2014  . Hearing loss 11/03/2013  .  Osteoporosis 03/06/2012  . UTI (urinary tract infection) 03/21/2011  . Depression 08/30/2009  . Iron deficiency anemia 06/01/2009  . Disease of pericardium 06/01/2009  . CKD (chronic kidney disease) stage 4, GFR 15-29 ml/min ([facility]) 06/01/2009  . CATARACTS 10/24/2005  . Essential hypertension 10/24/2005  . ANGIODYSPLASIA, COLON 05/09/2005    Past Surgical History:  Procedure Laterality Date  . ABDOMINAL HYSTERECTOMY  1960's  . ARTERIOVENOUS GRAFT PLACEMENT Left 11/28/2005   forearm arteriovenous  . CARDIAC CATHETERIZATION  01/2005   [name] 05/15/2010  . HALLUX VALGUS CORRECTION Left 01/2012   foot  . SUBXYPHOID  PERICARDIAL WINDOW  11/01/2004  . THROMBECTOMY Left  03/06/2006   Simple thrombectomy arteriovenous Gore-Tex graft, left arm, with exploration of venous end and intraoperative shuntogram.         . THROMBECTOMY / ARTERIOVENOUS GRAFT REVISION Left 01/21/2006   Thrombectomy and revision of left forearm loop AV graft.  . TUBAL LIGATION  1960's   "before hysterectomy"     OB History   No obstetric history on file.     Family History  Problem Relation Age of Onset  . Hypertension Brother   . Heart disease Father   . Diabetes Brother   . Breast cancer Neg Hx   . Colon cancer Neg Hx   . Lung cancer Neg Hx   . Cervical cancer Neg Hx   . Sickle cell anemia Neg Hx     Social History   Tobacco Use  . Smoking status: Former Smoker    Packs/day: 0.10    Years: 1.00    Pack years: 0.10    Types: Cigarettes    Quit date: 01/02/1960    Years since quitting: 59.2  . Smokeless tobacco: Never Used  . Tobacco comment: patient stated has never smoked   Substance Use Topics  . Alcohol use: No    Alcohol/week: 0.0 standard drinks  . Drug use: No    Home Medications Prior to Admission medications   Medication Sig Start Date End Date Taking? Authorizing Provider  allopurinol (ZYLOPRIM) 100 MG tablet Take 1.5 tablets (150 mg total) by mouth daily. 03/10/19   [name], MD  amLODipine (NORVASC) 10 MG tablet TAKE 1 TABLET BY MOUTH EVERY NIGHT AT BEDTIME 07/16/18   [name], MD  buPROPion (WELLBUTRIN XL) 300 MG 24 hr tablet TAKE 1 TABLET(300 MG) BY MOUTH DAILY Patient taking differently: Take 300 mg by mouth daily.  04/03/18   [name], MD  furosemide (LASIX) 80 MG tablet TAKE 1/2 TABLET(40 MG) BY MOUTH DAILY 06/10/18   [name], MD  metoprolol tartrate (LOPRESSOR) 25 MG tablet Take 0.5 tablets (12.5 mg total) by mouth 2 (two) times daily. 05/21/18   Lorella Nimrod, MD  Multiple Vitamins-Minerals (MULTIVITAMIN WITH MINERALS) tablet Take 1 tablet by mouth daily. Centrum  Silver    [provider]  omeprazole (PRILOSEC) 20 MG capsule Take 1 capsule (20 mg total) by mouth 2 (two) times daily. 02/05/18   [name], MD  SENSIPAR 30 MG tablet TAKE 1 TABLET BY MOUTH ONCE DAILY Patient taking differently: Take 30 mg by mouth daily with breakfast.  10/02/16   [name], MD  FLUoxetine (PROZAC) 10 MG tablet Take 1 tablet (10 mg total) by mouth daily. 03/07/11 06/02/18  [name], MD    Allergies    Ibuprofen  Review of Systems   Review of Systems  All other systems reviewed and are negative.   Physical Exam Updated Vital Signs BP (!) 170/86 (  BP Location: Right Arm)   Pulse 71   Temp 98.2 F (36.8 C) (Oral)   Resp 18   SpO2 98%   Physical Exam Vitals and nursing note reviewed.   85 year old female, resting comfortably and in no acute distress. Vital signs are significant for elevated blood pressure. Oxygen saturation is 98%, which is normal. Head is normocephalic and atraumatic. PERRLA, EOMI. Oropharynx is clear.  Moderate amount of cerumen in both external auditory canals, but not obstructing the canals and tympanic membranes are able to be visualized and are normal. Neck is nontender and supple without adenopathy or JVD.  There are no carotid bruits. Back is nontender and there is no CVA tenderness. Lungs are clear without rales, wheezes, or rhonchi. Chest is nontender. Heart has regular rate and rhythm without murmur. Abdomen is soft, flat, nontender without masses or hepatosplenomegaly and peristalsis is normoactive. Extremities have no cyanosis or edema, full range of motion is present. Skin is warm and dry without rash. Neurologic: Mental status is normal, cranial nerves are intact, there are no motor or sensory deficits.  There is moderate ataxia on finger-to-nose testing bilaterally.  On Romberg testing she is generally unsteady with a slight propensity to fall to the right.  ED Results / Procedures / Treatments    Labs (all labs ordered are listed, but only abnormal results are displayed) Labs Reviewed  CBC - Abnormal; Notable for the following components:      Result Value   RBC 3.77 (*)    Hemoglobin 11.9 (*)    All other components within normal limits  COMPREHENSIVE METABOLIC PANEL - Abnormal; Notable for the following components:   BUN 53 (*)    Creatinine, Ser 3.06 (*)    Alkaline Phosphatase 151 (*)    GFR calc non Af Amer 14 (*)    GFR calc Af Amer 16 (*)    All other components within normal limits  I-STAT CHEM 8, ED - Abnormal; Notable for the following components:   BUN 49 (*)    Creatinine, Ser 3.40 (*)    Calcium, Ion 1.12 (*)    All other components within normal limits  PROTIME-INR  APTT  DIFFERENTIAL  CBG MONITORING, ED   Radiology CT HEAD WO CONTRAST  Result Date: 03/12/2019 CLINICAL DATA:  Tinnitus for 2 weeks EXAM: CT HEAD WITHOUT CONTRAST TECHNIQUE: Contiguous axial images were obtained from the base of the skull through the vertex without intravenous contrast. COMPARISON:  02/18/2019 FINDINGS: Brain: Stable 2 cm planum sphenoidale meningioma is noted. No findings to suggest acute hemorrhage or acute infarction are noted. Mild chronic white matter ischemic changes are seen. Mild atrophic changes are noted as well. Vascular: No hyperdense vessel or unexpected calcification. Skull: Normal. Negative for fracture or focal lesion. Sinuses/Orbits: No acute finding. Other: None. IMPRESSION: Stable meningioma as described above. Chronic atrophic and ischemic changes without acute abnormality. Electronically Signed   By: Inez Catalina M.D.   On: 03/12/2019 22:06   MR BRAIN WO CONTRAST  Result Date: 03/13/2019 CLINICAL DATA:  Tinnitus for 2 weeks. Dizziness and loss of balance. EXAM: MRI HEAD WITHOUT CONTRAST TECHNIQUE: Multiplanar, multiecho pulse sequences of the brain and surrounding structures were obtained without intravenous contrast. COMPARISON:  Head CT from yesterday and  brain MRI 02/18/2019 FINDINGS: Brain: No acute infarction, hemorrhage, hydrocephalus, extra-axial collection or swelling. Mild chronic small vessel ischemia in the periventricular white matter. Small remote cerebellar infarcts. 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  There is local frontal lobe mass effect without brain edema. Vascular: Normal flow voids Skull and upper cervical spine: Normal marrow signal Sinuses/Orbits: Bilateral cataract resection Other: Progressively motion degraded study IMPRESSION: 1. No acute finding, including infarct. 2. Known anterior interhemispheric meningioma measuring 2.8 cm. 3. Overall mild chronic small vessel ischemia. Small remote cerebellar infarcts. 4. Progressively motion degraded. Electronically Signed   By: Monte Fantasia M.D.   On: 03/13/2019 07:04    Procedures Procedures  Medications Ordered in ED Medications  sodium chloride flush (NS) 0.9 % injection 3 mL (has no administration in time range)    ED Course  I have reviewed the triage vital signs and the nursing notes.  Pertinent labs & imaging results that were available during my care of the patient were reviewed by me and considered in my medical decision making (see chart for details).  MDM Rules/Calculators/A&P Patient with complaints of worsening tinnitus and general unsteadiness.  Old records are reviewed, and she actually has been evaluated for tinnitus and vertigo in the past.  CT shows stable meningioma and chronic atrophic changes.  She will be sent for MRI of the brain to rule out stroke.  MRI is unremarkable.  When she was evaluated by ENT in the past, she was prescribed Lipo flavonoid tablets that she is given a new prescription for that and is referred to ENT for further outpatient work-up and treatment.  Final Clinical Impression(s) / ED Diagnoses Final diagnoses:  Tinnitus of both ears  Renal insufficiency    Rx / DC Orders ED Discharge Orders          Ordered    Vitamins-Lipotropics (LIPOFLAVONOID) TABS  3 times daily PRN     03/13/19 6761           Delora Fuel, MD 95/09/32 [phone]

## 2019-03-24 ENCOUNTER — Other Ambulatory Visit: Payer: Self-pay | Admitting: Physician Assistant

## 2019-03-24 DIAGNOSIS — H93A3 Pulsatile tinnitus, bilateral: Secondary | ICD-10-CM

## 2019-04-12 ENCOUNTER — Other Ambulatory Visit: Payer: Self-pay | Admitting: Internal Medicine

## 2019-04-12 DIAGNOSIS — K219 Gastro-esophageal reflux disease without esophagitis: Secondary | ICD-10-CM

## 2019-04-23 ENCOUNTER — Other Ambulatory Visit: Payer: Self-pay | Admitting: Internal Medicine

## 2019-04-23 DIAGNOSIS — F329 Major depressive disorder, single episode, unspecified: Secondary | ICD-10-CM

## 2019-04-23 DIAGNOSIS — F32A Depression, unspecified: Secondary | ICD-10-CM

## 2019-05-01 DIAGNOSIS — N185 Chronic kidney disease, stage 5: Secondary | ICD-10-CM | POA: Diagnosis not present

## 2019-05-01 DIAGNOSIS — D631 Anemia in chronic kidney disease: Secondary | ICD-10-CM | POA: Diagnosis not present

## 2019-05-01 DIAGNOSIS — D320 Benign neoplasm of cerebral meninges: Secondary | ICD-10-CM | POA: Diagnosis not present

## 2019-05-01 DIAGNOSIS — I132 Hypertensive heart and chronic kidney disease with heart failure and with stage 5 chronic kidney disease, or end stage renal disease: Secondary | ICD-10-CM | POA: Diagnosis not present

## 2019-05-15 DIAGNOSIS — Z79899 Other long term (current) drug therapy: Secondary | ICD-10-CM | POA: Diagnosis not present

## 2019-05-15 DIAGNOSIS — D631 Anemia in chronic kidney disease: Secondary | ICD-10-CM | POA: Diagnosis not present

## 2019-05-15 DIAGNOSIS — N185 Chronic kidney disease, stage 5: Secondary | ICD-10-CM | POA: Diagnosis not present

## 2019-05-15 DIAGNOSIS — Z0001 Encounter for general adult medical examination with abnormal findings: Secondary | ICD-10-CM | POA: Diagnosis not present

## 2019-05-18 DIAGNOSIS — N8111 Cystocele, midline: Secondary | ICD-10-CM | POA: Diagnosis not present

## 2019-05-26 ENCOUNTER — Other Ambulatory Visit: Payer: Self-pay | Admitting: Physician Assistant

## 2019-05-26 DIAGNOSIS — M810 Age-related osteoporosis without current pathological fracture: Secondary | ICD-10-CM

## 2019-06-03 IMAGING — CR CHEST - 2 VIEW
2 series · 2 of 2 positions shown · non-contrast
Comparison: 10/16/2017

CLINICAL DATA: Epigastric pain and shortness of breath

EXAM:
CHEST - 2 VIEW

[chest pa]
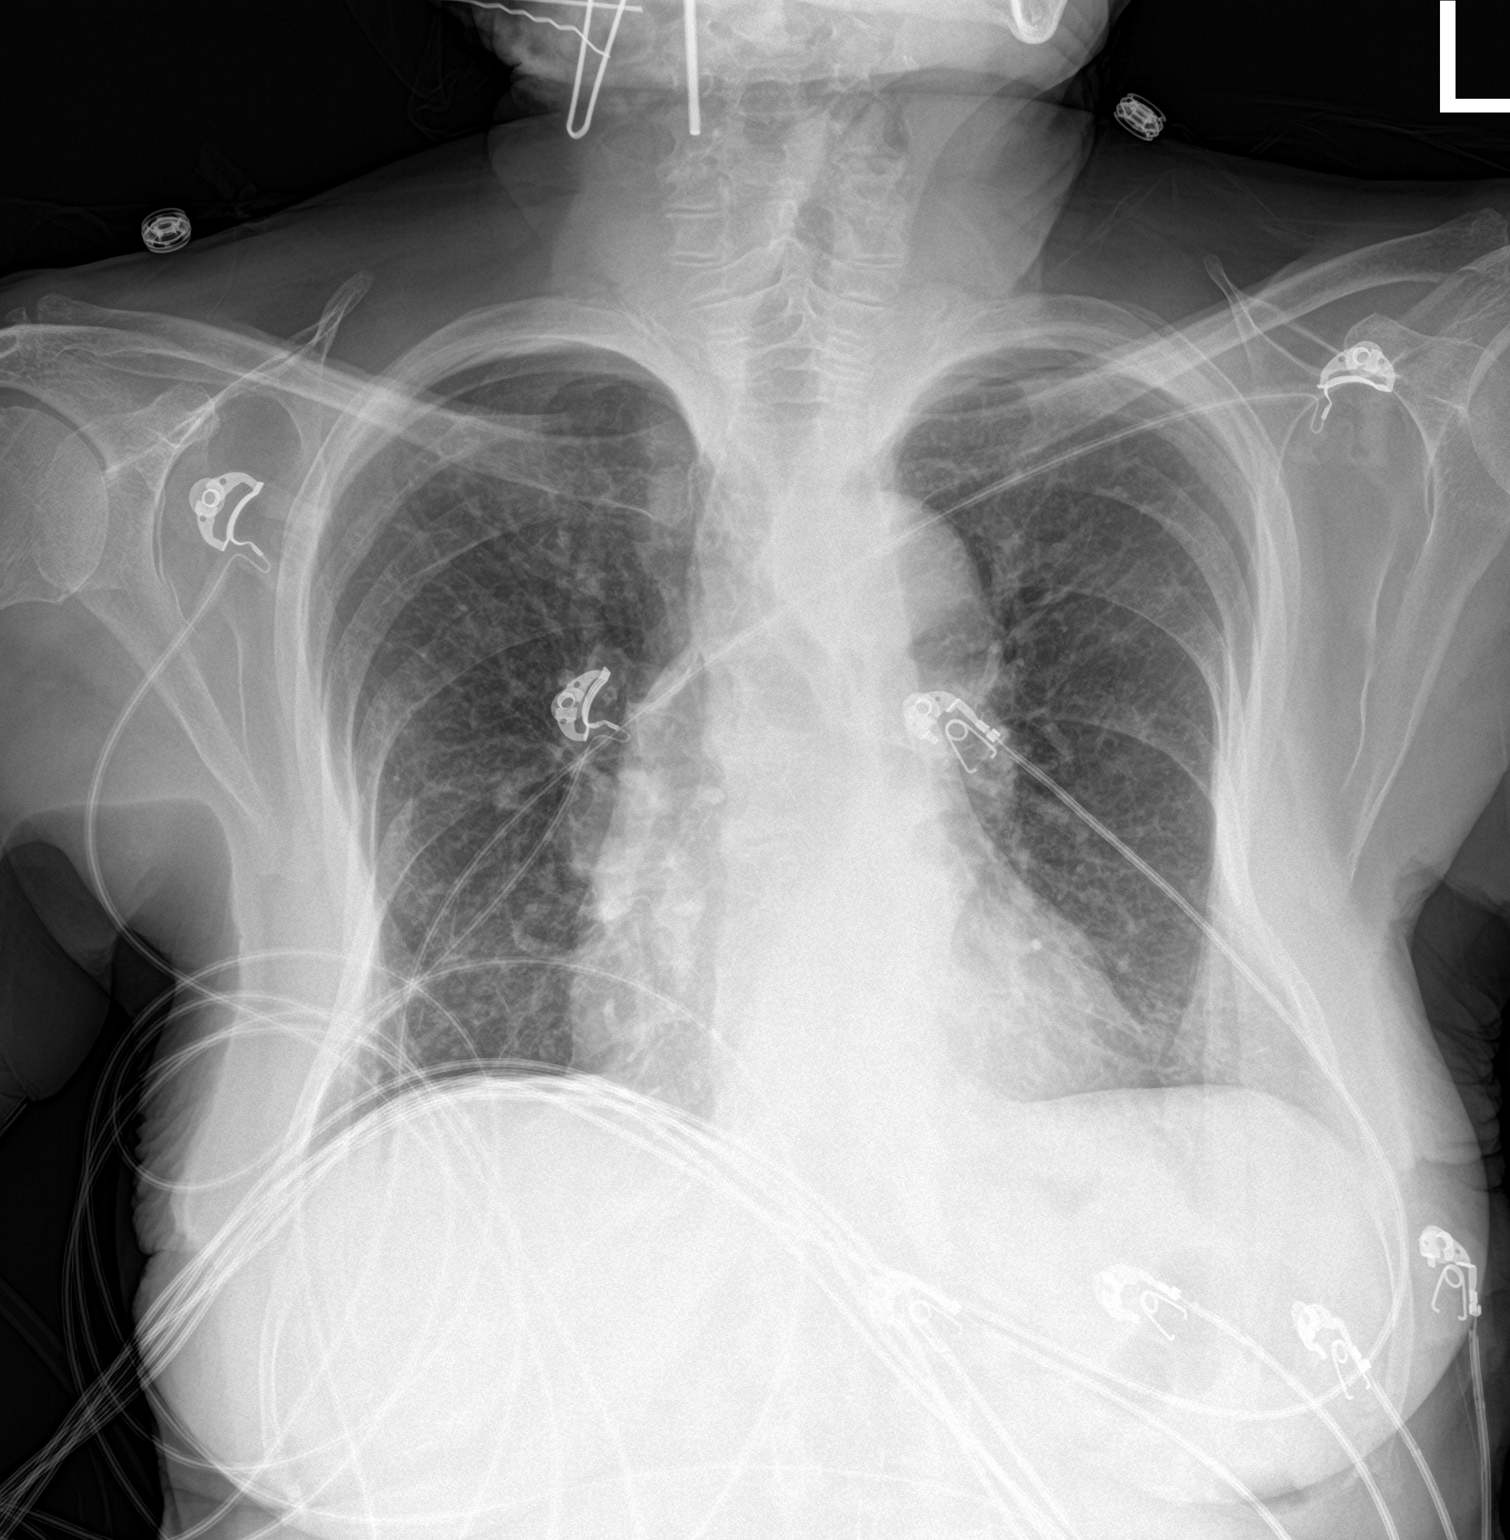

[chest lat]
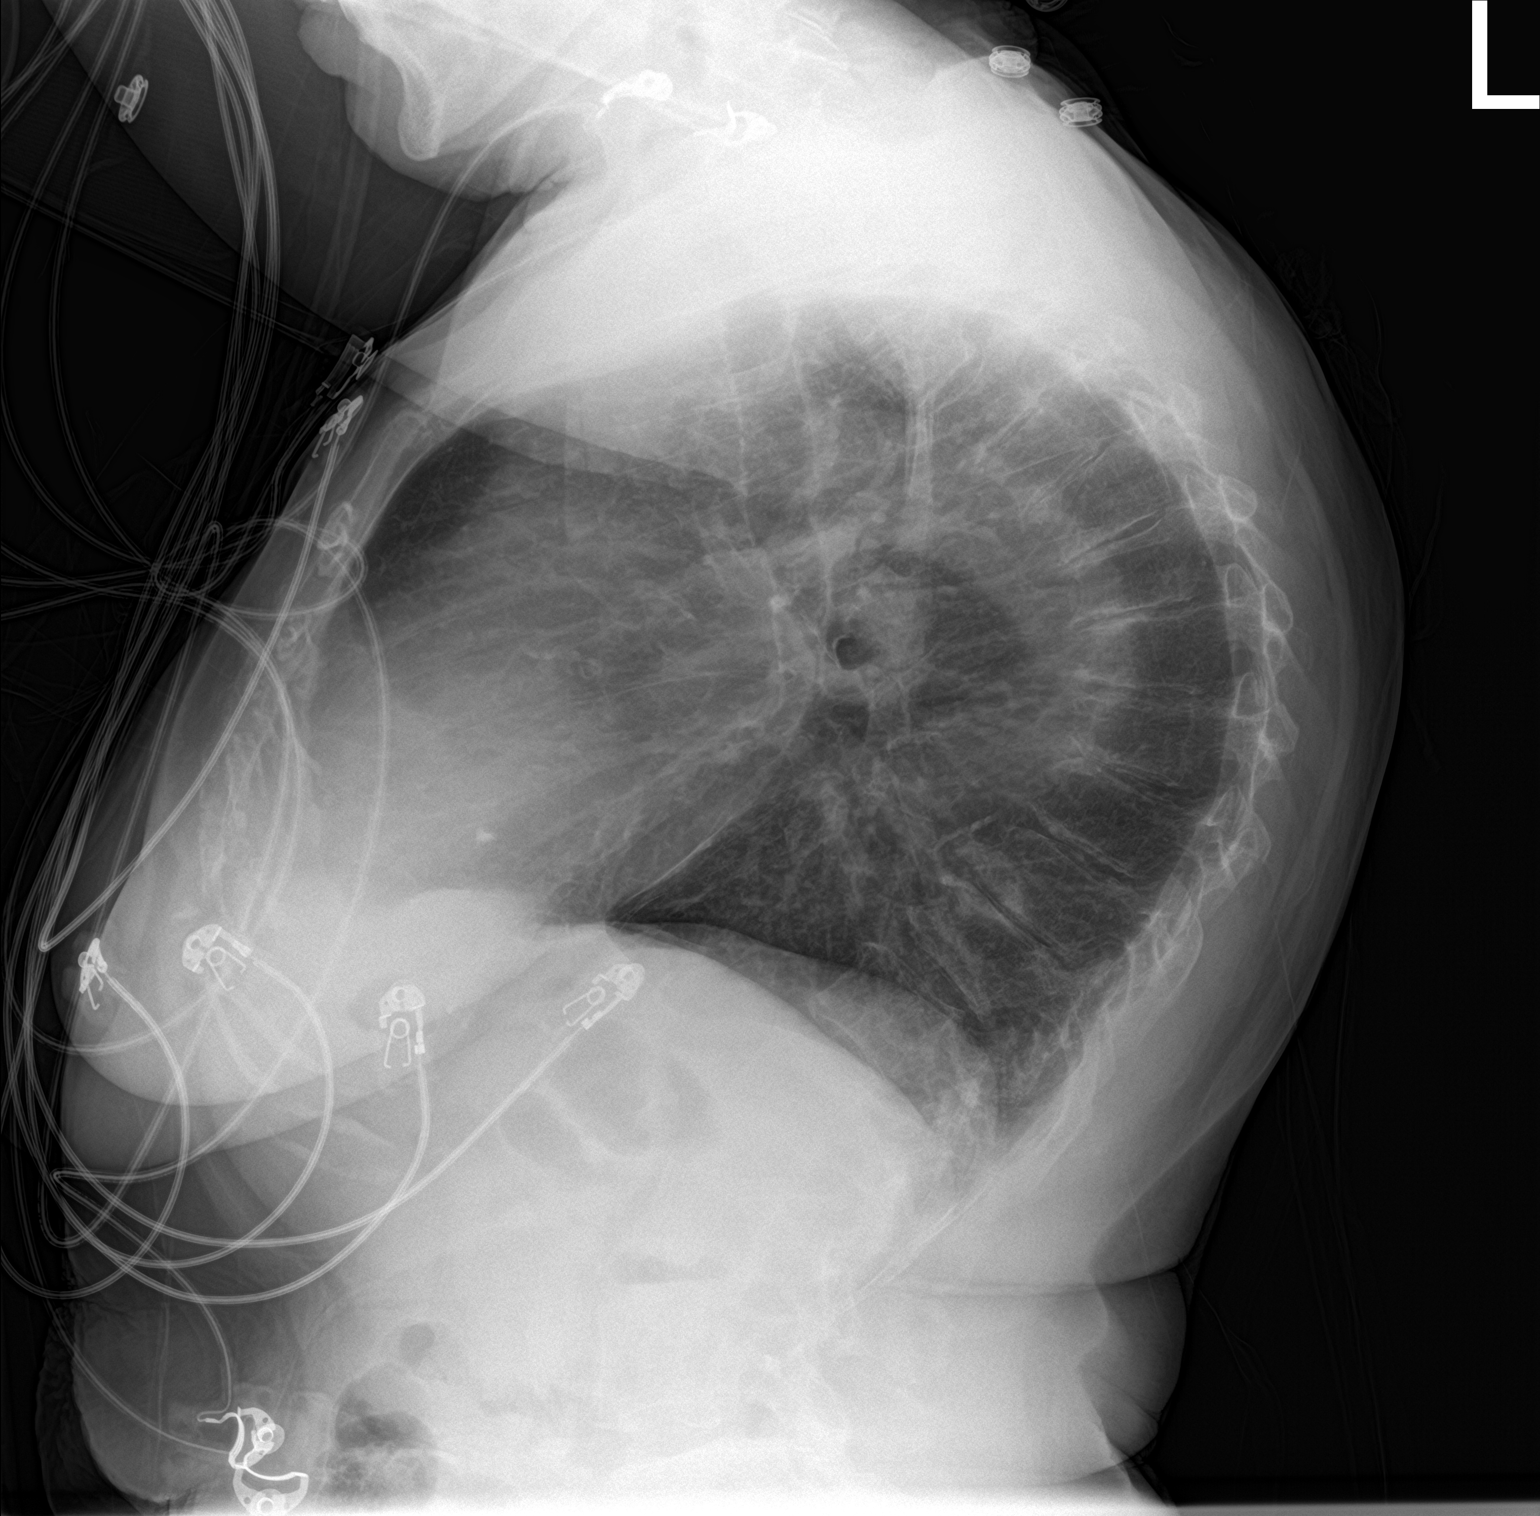

[2 of 2 positions shown; findings below may reference images not displayed]

FINDINGS: Cardiac shadow is mildly enlarged. Thoracic aorta is tortuous but
stable. The lungs are well aerated without focal infiltrate or
effusion. Degenerative changes of the thoracic spine are seen.
IMPRESSION: No active cardiopulmonary disease.

## 2019-07-05 ENCOUNTER — Emergency Department (HOSPITAL_COMMUNITY)
Admission: EM | Admit: 2019-07-05 | Discharge: 2019-07-05 | Disposition: A | Discharge status: Home / Self Care | Payer: Medicare Other | Attending: Emergency Medicine | Admitting: Emergency Medicine

## 2019-07-05 ENCOUNTER — Emergency Department (HOSPITAL_COMMUNITY): Payer: Medicare Other

## 2019-07-05 ENCOUNTER — Other Ambulatory Visit: Payer: Self-pay

## 2019-07-05 ENCOUNTER — Encounter (HOSPITAL_COMMUNITY): Payer: Self-pay | Admitting: Emergency Medicine

## 2019-07-05 DIAGNOSIS — D329 Benign neoplasm of meninges, unspecified: Secondary | ICD-10-CM | POA: Insufficient documentation

## 2019-07-05 DIAGNOSIS — R42 Dizziness and giddiness: Secondary | ICD-10-CM | POA: Insufficient documentation

## 2019-07-05 DIAGNOSIS — R112 Nausea with vomiting, unspecified: Secondary | ICD-10-CM | POA: Diagnosis not present

## 2019-07-05 DIAGNOSIS — N184 Chronic kidney disease, stage 4 (severe): Secondary | ICD-10-CM | POA: Insufficient documentation

## 2019-07-05 DIAGNOSIS — Z87891 Personal history of nicotine dependence: Secondary | ICD-10-CM | POA: Insufficient documentation

## 2019-07-05 DIAGNOSIS — I129 Hypertensive chronic kidney disease with stage 1 through stage 4 chronic kidney disease, or unspecified chronic kidney disease: Secondary | ICD-10-CM | POA: Insufficient documentation

## 2019-07-05 DIAGNOSIS — Z79899 Other long term (current) drug therapy: Secondary | ICD-10-CM | POA: Insufficient documentation

## 2019-07-05 DIAGNOSIS — R531 Weakness: Secondary | ICD-10-CM

## 2019-07-05 DIAGNOSIS — Z7982 Long term (current) use of aspirin: Secondary | ICD-10-CM | POA: Diagnosis not present

## 2019-07-05 LAB — CBC
HCT: 39 % (ref 36.0–46.0)
Hemoglobin: 12.4 g/dL (ref 12.0–15.0)
MCH: 31.2 pg (ref 26.0–34.0)
MCHC: 31.8 g/dL (ref 30.0–36.0)
MCV: 98.2 fL (ref 80.0–100.0)
Platelets: 193 10*3/uL (ref 150–400)
RBC: 3.97 MIL/uL (ref 3.87–5.11)
RDW: 13.2 % (ref 11.5–15.5)
WBC: 4.4 10*3/uL (ref 4.0–10.5)
nRBC: 0 % (ref 0.0–0.2)

## 2019-07-05 LAB — URINALYSIS, ROUTINE W REFLEX MICROSCOPIC
Bilirubin Urine: NEGATIVE
Glucose, UA: NEGATIVE mg/dL
Hgb urine dipstick: NEGATIVE
Ketones, ur: NEGATIVE mg/dL
Nitrite: NEGATIVE
Protein, ur: NEGATIVE mg/dL
Specific Gravity, Urine: 1.013 (ref 1.005–1.030)
pH: 5 (ref 5.0–8.0)

## 2019-07-05 LAB — LIPASE, BLOOD: Lipase: 43 U/L (ref 11–51)

## 2019-07-05 LAB — COMPREHENSIVE METABOLIC PANEL
ALT: 19 U/L (ref 0–44)
AST: 22 U/L (ref 15–41)
Albumin: 3.9 g/dL (ref 3.5–5.0)
Alkaline Phosphatase: 122 U/L (ref 38–126)
Anion gap: 11 (ref 5–15)
BUN: 51 mg/dL — ABNORMAL HIGH (ref 8–23)
CO2: 27 mmol/L (ref 22–32)
Calcium: 9.5 mg/dL (ref 8.9–10.3)
Chloride: 100 mmol/L (ref 98–111)
Creatinine, Ser: 3.03 mg/dL — ABNORMAL HIGH (ref 0.44–1.00)
GFR calc Af Amer: 16 mL/min — ABNORMAL LOW (ref >60)
GFR calc non Af Amer: 14 mL/min — ABNORMAL LOW (ref >60)
Glucose, Bld: 113 mg/dL — ABNORMAL HIGH (ref 70–99)
Potassium: 4.4 mmol/L (ref 3.5–5.1)
Sodium: 138 mmol/L (ref 135–145)
Total Bilirubin: 0.6 mg/dL (ref 0.3–1.2)
Total Protein: 7.1 g/dL (ref 6.5–8.1)

## 2019-07-05 MED ORDER — SODIUM CHLORIDE 0.9% FLUSH
3.0000 mL | Freq: Once | INTRAVENOUS | Status: AC
  Administered 2019-07-05: 3 mL via INTRAVENOUS

## 2019-07-05 MED ORDER — DIAZEPAM 5 MG/ML IJ SOLN
2.5000 mg | Freq: Once | INTRAMUSCULAR | Status: AC
  Administered 2019-07-05: 2.5 mg via INTRAVENOUS
  Filled 2019-07-05: qty 2

## 2019-07-05 NOTE — ED Notes (Signed)
Pt transported to MRI 

## 2019-07-05 NOTE — ED Notes (Signed)
Patient given bag meal with drink. 

## 2019-07-05 NOTE — ED Triage Notes (Signed)
Reports dizziness and lower abd pain since yesterday.  States she was vomiting last night.  Reports generalized weakness.

## 2019-07-05 NOTE — Discharge Instructions (Addendum)
Return for new concerns. No signs of stroke on your MRI.

## 2019-07-05 NOTE — ED Provider Notes (Signed)
[facility] EMERGENCY DEPARTMENT Provider Note   CSN: [id number] Arrival date & time: 07/05/19  1117     History Chief Complaint  Patient presents with  . Dizziness  . Emesis  . Abdominal Pain    [name] is a 85 y.o. female.  85 yo F with a chief complaints of dizziness. This been going on for about a week now. States that it is worse when she gets up to try to move around. Noticed it was significantly worse this morning and so decided to come to the hospital for evaluation. She is also felt sick to her stomach had not really felt like eating and drinking. She tried to eat peanut butter crackers this morning and felt like it just kept getting larger and larger in her mouth and eventually spit it out. She denies vomiting. Denies chest pain or abdominal pain. She feels that her right side is weaker than her left. She was told years ago when she had a left-sided stroke that her right side would have to bear the brunt of her weight and she thinks that may be that side is now fatigued.  The history is provided by the patient.  Dizziness Quality:  Imbalance Severity:  Moderate Onset quality:  Gradual Duration:  1 week Timing:  Constant Progression:  Worsening Chronicity:  New Context: physical activity and standing up   Relieved by:  Nothing Worsened by:  Nothing Ineffective treatments:  None tried Associated symptoms: nausea   Associated symptoms: no chest pain, no headaches, no palpitations, no shortness of breath and no vomiting   Emesis Associated symptoms: abdominal pain   Associated symptoms: no arthralgias, no chills, no fever, no headaches and no myalgias   Abdominal Pain Associated symptoms: nausea   Associated symptoms: no chest pain, no chills, no dysuria, no fever, no shortness of breath and no vomiting        Past Medical History:  Diagnosis Date  . Anemia due to blood loss, chronic   . Anemia, iron deficiency   . Angiodysplasia of  colon 05/09/2005   Single small angiodysplastic lesion in the descending colon found on colonoscopy 05/09/2005 by [name]  . Arthritis    "knots on my thumbs; left elbow" (03/03/2014)  . Cataracts, bilateral   . Chest pain, non-cardiac 05/30/2017  . Chronic ankle pain    bilateral  . Chronic kidney disease, stage IV (severe) ([place])    [name] 02/09/2014  . Chronic knee pain    bilateral  . Chronic thumb pain   . Depression   . Diverticulitis of large intestine without perforation or abscess without bleeding 05/21/2018  . Ear noise 01/07/2013  . Epicondylitis   . Gastritis, Helicobacter pylori    Noted on EGD 01/18/2009 by [name];  biopsy showed chronic gastritis with Helicobacter pylori, treated by [name].  [name] GERD (gastroesophageal reflux disease)   . Gout   . Hemorrhoids, internal 05/09/2005    Found on colonoscopy 05/09/2005 by [name]  . History of blood transfusion    "low blood count"  . Hypertension   . Osteoporosis, unspecified 03/06/2012   A DXA bone density scan done 02/20/2012 showed osteoporosis with a lumbar spine T-score of -4.4 and a right femur T-score of -2.8.  Patient has chronic kidney disease with estimated GFR less than 30, and it is not clear how much of her osteoporosis may be due to renal osteodystrophy.    . Pericardial effusion 11/2004  S/P subxiphoid pericardial window 11/01/2004 by [name].  A question of possible collagen vascular disease had been raised in 2006 due to arthralgias, a history of idiopathic pericarditis, and increased pigmentation of her palms, and she was referred to [name] but he was unable to confirm a rheumatologic diagnosis.  . Pinguecula   . Renal failure    Formerly on hemodialysis; managed by Dr. Corliss Parish  . Stiffness of joint, not elsewhere classified, hand   . Stroke [facility]) 2007   "when my kidneys went out & I was in a coma; weak LUE/LLE since"  . Subjective tinnitus of both ears  01/24/2018  . Thigh pain   . Vitreous detachment    "no OR"    Patient Active Problem List   Diagnosis Date Noted  . Benign positional vertigo 09/21/2016  . Preventative health care 09/11/2016  . Vaginal mass 07/19/2016  . Insomnia 05/03/2015  . GERD (gastroesophageal reflux disease) 05/03/2015  . Fatigue 04/20/2015  . Meningioma ([place]) 03/01/2015  . Tinnitus 01/24/2015  . History of CVA (cerebrovascular accident) 01/24/2015  . Gout 03/03/2014  . Hearing loss 11/03/2013  . Osteoporosis 03/06/2012  . UTI (urinary tract infection) 03/21/2011  . Depression 08/30/2009  . Iron deficiency anemia 06/01/2009  . Disease of pericardium 06/01/2009  . CKD (chronic kidney disease) stage 4, GFR 15-29 ml/min ([facility]) 06/01/2009  . CATARACTS 10/24/2005  . Essential hypertension 10/24/2005  . ANGIODYSPLASIA, COLON 05/09/2005    Past Surgical History:  Procedure Laterality Date  . ABDOMINAL HYSTERECTOMY  1960's  . ARTERIOVENOUS GRAFT PLACEMENT Left 11/28/2005   forearm arteriovenous  . CARDIAC CATHETERIZATION  01/2005   [name] 05/15/2010  . HALLUX VALGUS CORRECTION Left 01/2012   foot  . SUBXYPHOID PERICARDIAL WINDOW  11/01/2004  . THROMBECTOMY Left  03/06/2006   Simple thrombectomy arteriovenous Gore-Tex graft, left arm, with exploration of venous end and intraoperative shuntogram.         . THROMBECTOMY / ARTERIOVENOUS GRAFT REVISION Left 01/21/2006   Thrombectomy and revision of left forearm loop AV graft.  . TUBAL LIGATION  1960's   "before hysterectomy"     OB History   No obstetric history on file.     Family History  Problem Relation Age of Onset  . Hypertension Brother   . Heart disease Father   . Diabetes Brother   . Breast cancer Neg Hx   . Colon cancer Neg Hx   . Lung cancer Neg Hx   . Cervical cancer Neg Hx   . Sickle cell anemia Neg Hx     Social History   Tobacco Use  . Smoking status: Former Smoker    Packs/day: 0.10    Years: 1.00    Pack years: 0.10     Types: Cigarettes    Quit date: 01/02/1960    Years since quitting: 59.5  . Smokeless tobacco: Never Used  . Tobacco comment: patient stated has never smoked   Vaping Use  . Vaping Use: Never used  Substance Use Topics  . Alcohol use: No    Alcohol/week: 0.0 standard drinks  . Drug use: No    Home Medications Prior to Admission medications   Medication Sig Start Date End Date Taking? Authorizing Provider  allopurinol (ZYLOPRIM) 100 MG tablet Take 1.5 tablets (150 mg total) by mouth daily. Patient taking differently: Take 100 mg by mouth daily.  03/10/19  Yes [name], MD  amLODipine (NORVASC) 10 MG tablet TAKE 1 TABLET BY MOUTH EVERY NIGHT  AT BEDTIME Patient taking differently: Take 10 mg by mouth at bedtime.  07/16/18  Yes [name], MD  aspirin EC 81 MG tablet Take 81 mg by mouth at bedtime. Swallow whole.   Yes [provider]  buPROPion (WELLBUTRIN XL) 300 MG 24 hr tablet TAKE 1 TABLET(300 MG) BY MOUTH DAILY Patient taking differently: Take 300 mg by mouth daily.  04/23/19  Yes [name], MD  furosemide (LASIX) 80 MG tablet TAKE 1/2 TABLET(40 MG) BY MOUTH DAILY Patient taking differently: Take 40 mg by mouth daily.  06/10/18  Yes [name], MD  metoprolol tartrate (LOPRESSOR) 25 MG tablet Take 0.5 tablets (12.5 mg total) by mouth 2 (two) times daily. Patient taking differently: Take 12.5 mg by mouth daily.  05/21/18  Yes Lorella Nimrod, MD  Multiple Vitamins-Minerals (MULTIVITAMIN WITH MINERALS) tablet Take 1 tablet by mouth daily. Centrum Silver   Yes [provider]  omeprazole (PRILOSEC) 20 MG capsule Take 1 capsule (20 mg total) by mouth 2 (two) times daily. Patient taking differently: Take 20 mg by mouth daily.  04/14/19 04/13/20 Yes [name], MD  Vitamins-Lipotropics (LIPOFLAVONOID) TABS Take 1 tablet by mouth 3 (three) times daily as needed (tinnitus). 6/80/88  Yes Delora Fuel, MD  SENSIPAR 30 MG tablet TAKE 1 TABLET BY MOUTH  ONCE DAILY Patient not taking: Reported on 07/05/2019 10/02/16   [name], MD  FLUoxetine (PROZAC) 10 MG tablet Take 1 tablet (10 mg total) by mouth daily. 03/07/11 06/02/18  [name], MD    Allergies    Ibuprofen  Review of Systems   Review of Systems  Constitutional: Negative for chills and fever.  HENT: Negative for congestion and rhinorrhea.   Eyes: Negative for redness and visual disturbance.  Respiratory: Negative for shortness of breath and wheezing.   Cardiovascular: Negative for chest pain and palpitations.  Gastrointestinal: Positive for abdominal pain and nausea. Negative for vomiting.  Genitourinary: Negative for dysuria and urgency.  Musculoskeletal: Negative for arthralgias and myalgias.  Skin: Negative for pallor and wound.  Neurological: Positive for dizziness. Negative for headaches.    Physical Exam Updated Vital Signs BP (!) 173/102   Pulse 76   Temp 98.5 F (36.9 C) (Oral)   Resp 20   SpO2 100%   Physical Exam Vitals and nursing note reviewed.  Constitutional:      General: She is not in acute distress.    Appearance: She is well-developed. She is not diaphoretic.  HENT:     Head: Normocephalic and atraumatic.  Eyes:     Pupils: Pupils are equal, round, and reactive to light.  Cardiovascular:     Rate and Rhythm: Normal rate and regular rhythm.     Heart sounds: No murmur heard.  No friction rub. No gallop.   Pulmonary:     Effort: Pulmonary effort is normal.     Breath sounds: No wheezing or rales.  Abdominal:     General: There is no distension.     Palpations: Abdomen is soft.     Tenderness: There is no abdominal tenderness.  Musculoskeletal:        General: No tenderness.     Cervical back: Normal range of motion and neck supple.  Skin:    General: Skin is warm and dry.  Neurological:     Mental Status: She is alert and oriented to person, place, and time.     Comments: Obvious weakness on the right compared to the left. She  has some ataxia  worse with the right upper extremity than the left. She has cogwheeling to the upper extremities. Subjective decrease sensation to the right side of her face.  Psychiatric:        Behavior: Behavior normal.     ED Results / Procedures / Treatments   Labs (all labs ordered are listed, but only abnormal results are displayed) Labs Reviewed  COMPREHENSIVE METABOLIC PANEL - Abnormal; Notable for the following components:      Result Value   Glucose, Bld 113 (*)    BUN 51 (*)    Creatinine, Ser 3.03 (*)    GFR calc non Af Amer 14 (*)    GFR calc Af Amer 16 (*)    All other components within normal limits  URINALYSIS, ROUTINE W REFLEX MICROSCOPIC - Abnormal; Notable for the following components:   Leukocytes,Ua SMALL (*)    Bacteria, UA RARE (*)    All other components within normal limits  LIPASE, BLOOD  CBC    EKG EKG Interpretation  Date/Time:  Sunday July 05 2019 11:26:41 EDT Ventricular Rate:  68 PR Interval:  162 QRS Duration: 88 QT Interval:  398 QTC Calculation: 423 R Axis:   52 Text Interpretation: Normal sinus rhythm Normal ECG No significant change since last tracing Confirmed by Jermichael Belmares ([id number]) on 07/05/2019 12:47:44 PM   Radiology CT Head Wo Contrast  Result Date: 07/05/2019 CLINICAL DATA:  Dizziness, vomiting EXAM: CT HEAD WITHOUT CONTRAST TECHNIQUE: Contiguous axial images were obtained from the base of the skull through the vertex without intravenous contrast. COMPARISON:  03/12/2019 FINDINGS: Brain: No evidence of acute infarction, hemorrhage, hydrocephalus, extra-axial collection or mass lesion/mass effect. Periventricular white matter hypodensity. Vascular: No hyperdense vessel or unexpected calcification. Skull: Normal. Negative for fracture or focal lesion. Sinuses/Orbits: No acute finding. Other: None. IMPRESSION: No acute intracranial pathology. Small-vessel white matter disease. Electronically Signed   By: Alex  Bibbey M.D.   On: 07/05/2019  13:57    Procedures Procedures (including critical care time)  Medications Ordered in ED Medications  sodium chloride flush (NS) 0.9 % injection 3 mL (has no administration in time range)  diazepam (VALIUM) injection 2.5 mg (has no administration in time range)    ED Course  I have reviewed the triage vital signs and the nursing notes.  Pertinent labs & imaging results that were available during my care of the patient were reviewed by me and considered in my medical decision making (see chart for details).    MDM Rules/Calculators/A&P                          85  yo F with a chief complaints of dizziness. Going on for about a week now. On my exam the patient has right-sided weakness which she said is new this week. States that she had some weakness in that side remotely when she had a stroke on the left side. Will obtain a CT scan of the head. Will discuss with neurology. Patient is reluctant to get an MRI.  I discussed the recommendations with the patient.  She is willing to try to get an MRI with some sedation.  We will try heavy Valium as this will likely also improve her sensation of dizziness.    I discussed the case with the neurologist Dr. Cheral Marker.  He did recommend an MRI/MRA.  Signed out to Dr. Reather Converse please see their note for further details of care in the ED.  The patients results  and plan were reviewed and discussed.   Any x-rays performed were independently reviewed by myself.   Differential diagnosis were considered with the presenting HPI.  Medications  sodium chloride flush (NS) 0.9 % injection 3 mL (has no administration in time range)  diazepam (VALIUM) injection 2.5 mg (has no administration in time range)    Vitals:   07/05/19 1313 07/05/19 1315 07/05/19 1430 07/05/19 1445  BP: (!) 158/104 (!) 168/87 (!) 145/89 (!) 173/102  Pulse:  69 66 76  Resp:   14 20  Temp:      TempSrc:      SpO2:  100% 100% 100%    Final diagnoses:  Weakness     Final  Clinical Impression(s) / ED Diagnoses Final diagnoses:  Weakness    Rx / DC Orders ED Discharge Orders    None       Deno Etienne, DO 07/05/19 1510

## 2019-07-05 NOTE — ED Notes (Signed)
Pt. Denies d/n/v.

## 2019-07-15 ENCOUNTER — Encounter: Payer: Self-pay | Admitting: General Practice

## 2019-07-16 ENCOUNTER — Other Ambulatory Visit: Payer: Self-pay | Admitting: Internal Medicine

## 2019-07-16 ENCOUNTER — Other Ambulatory Visit: Payer: Self-pay | Admitting: *Deleted

## 2019-07-16 DIAGNOSIS — I10 Essential (primary) hypertension: Secondary | ICD-10-CM

## 2019-07-16 MED ORDER — METOPROLOL TARTRATE 25 MG PO TABS: 12.5000 mg | ORAL_TABLET | Freq: Two times a day (BID) | ORAL | 3 refills | Status: DC

## 2019-08-28 ENCOUNTER — Other Ambulatory Visit: Payer: Medicare HMO

## 2019-09-30 ENCOUNTER — Other Ambulatory Visit: Payer: Self-pay | Admitting: Physician Assistant

## 2019-09-30 DIAGNOSIS — Z1231 Encounter for screening mammogram for malignant neoplasm of breast: Secondary | ICD-10-CM

## 2019-10-14 ENCOUNTER — Other Ambulatory Visit: Payer: Self-pay | Admitting: Internal Medicine

## 2019-10-14 DIAGNOSIS — N184 Chronic kidney disease, stage 4 (severe): Secondary | ICD-10-CM

## 2019-10-26 ENCOUNTER — Ambulatory Visit
Admission: RE | Admit: 2019-10-26 | Discharge: 2019-10-26 | Disposition: A | Discharge status: Home / Self Care | Payer: Medicare Other | Source: Ambulatory Visit | Attending: Physician Assistant | Admitting: Physician Assistant

## 2019-10-26 ENCOUNTER — Other Ambulatory Visit: Payer: Self-pay

## 2019-10-26 DIAGNOSIS — Z1231 Encounter for screening mammogram for malignant neoplasm of breast: Secondary | ICD-10-CM

## 2019-10-30 ENCOUNTER — Ambulatory Visit (INDEPENDENT_AMBULATORY_CARE_PROVIDER_SITE_OTHER): Payer: Medicare Other | Admitting: Internal Medicine

## 2019-10-30 ENCOUNTER — Encounter: Payer: Self-pay | Admitting: Internal Medicine

## 2019-10-30 ENCOUNTER — Other Ambulatory Visit: Payer: Self-pay

## 2019-10-30 VITALS — BP 160/60 | HR 64 | Ht 62.0 in | Wt 144.0 lb

## 2019-10-30 DIAGNOSIS — I340 Nonrheumatic mitral (valve) insufficiency: Secondary | ICD-10-CM | POA: Diagnosis not present

## 2019-10-30 DIAGNOSIS — I1311 Hypertensive heart and chronic kidney disease without heart failure, with stage 5 chronic kidney disease, or end stage renal disease: Secondary | ICD-10-CM

## 2019-10-30 DIAGNOSIS — I10 Essential (primary) hypertension: Secondary | ICD-10-CM

## 2019-10-30 DIAGNOSIS — N185 Chronic kidney disease, stage 5: Secondary | ICD-10-CM

## 2019-10-30 HISTORY — DX: Hypertensive heart and chronic kidney disease without heart failure, with stage 5 chronic kidney disease, or end stage renal disease: I13.11

## 2019-10-30 MED ORDER — LABETALOL HCL 100 MG PO TABS: 100.0000 mg | ORAL_TABLET | Freq: Two times a day (BID) | ORAL | 2 refills | Status: DC

## 2019-10-30 NOTE — Patient Instructions (Signed)
Medication Instructions:  Your physician has recommended you make the following change in your medication:  1.) stop metoprolol 2.) start labetalol 100 mg --one tablet twice a day  *If you need a refill on your cardiac medications before your next appointment, please call your pharmacy*   Lab Work: none If you have labs (blood work) drawn today and your tests are completely normal, you will receive your results only by: [name] MyChart Message (if you have MyChart) OR . A paper copy in the mail If you have any lab test that is abnormal or we need to change your treatment, we will call you to review the results.   Testing/Procedures: Your physician has requested that you have an echocardiogram. Echocardiography is a painless test that uses sound waves to create images of your heart. It provides your doctor with information about the size and shape of your heart and how well your heart's chambers and valves are working. This procedure takes approximately one hour. There are no restrictions for this procedure.     Follow-Up: At [facility], you and your health needs are our priority.  As part of our continuing mission to provide you with exceptional heart care, we have created designated Provider Care Teams.  These Care Teams include your primary Cardiologist (physician) and Advanced Practice Providers (APPs -  Physician Assistants and Nurse Practitioners) who all work together to provide you with the care you need, when you need it.  We recommend signing up for the patient portal called "MyChart".  Sign up information is provided on this After Visit Summary.  MyChart is used to connect with patients for Virtual Visits (Telemedicine).  Patients are able to view lab/test results, encounter notes, upcoming appointments, etc.  Non-urgent messages can be sent to your provider as well.   To learn more about what you can do with MyChart, go to [email or web address].    Your next appointment:   6  month(s)  The format for your next appointment:   In Person  Provider:   Rudean Haskell, MD   Other Instructions

## 2019-10-30 NOTE — Progress Notes (Signed)
Cardiology Office Note:    Date:  10/30/2019   ID:  [name], DOB 11/09/1934, MRN [id number]  PCP:  [name], MD  [facility] Cardiologist:  Werner Lean, MD  Previously seen by Drs. Hilty and [place] in consultation (2016) for chest pain  CC: HTN Consulted for the evaluation of hypertension  at the behest of [name], MD  History of Present Illness:    [name] is a 85 y.o. female with a hx of CKD Stage V not on HD, HTN, Prior CVA 2017, question of collagen vascular disease not confirmed by rheumatology, who presented in 2016 with CP s/p normal EF and NM Stress.  Patient notes that she is feeling great.  Forgot hearing aids and history is helped by her grandaughter.  Able to do ADLs, and cook without issue.  No chest pain and without shortness of breath.  PND and orthropnea With significant exercise has some dyspnea on exertion.  No syncope or near syncope.  Ambulatory blood pressure is available but does not use it.  Past Medical History:  Diagnosis Date  . Anemia due to blood loss, chronic   . Anemia, iron deficiency   . Angiodysplasia of colon 05/09/2005   Single small angiodysplastic lesion in the descending colon found on colonoscopy 05/09/2005 by [name]  . Arthritis    "knots on my thumbs; left elbow" (03/03/2014)  . Cataracts, bilateral   . Chest pain, non-cardiac 05/30/2017  . Chronic ankle pain    bilateral  . Chronic kidney disease, stage IV (severe) ([place])    [name] 02/09/2014  . Chronic knee pain    bilateral  . Chronic thumb pain   . Depression   . Diverticulitis of large intestine without perforation or abscess without bleeding 05/21/2018  . Ear noise 01/07/2013  . Epicondylitis   . Gastritis, Helicobacter pylori    Noted on EGD 01/18/2009 by [name];  biopsy showed chronic gastritis with Helicobacter pylori, treated by [name].  [name] GERD (gastroesophageal reflux disease)   . Gout   . Hemorrhoids,  internal 05/09/2005    Found on colonoscopy 05/09/2005 by [name]  . History of blood transfusion    "low blood count"  . Hypertension   . Osteoporosis, unspecified 03/06/2012   A DXA bone density scan done 02/20/2012 showed osteoporosis with a lumbar spine T-score of -4.4 and a right femur T-score of -2.8.  Patient has chronic kidney disease with estimated GFR less than 30, and it is not clear how much of her osteoporosis may be due to renal osteodystrophy.    . Pericardial effusion 11/2004   S/P subxiphoid pericardial window 11/01/2004 by [name].  A question of possible collagen vascular disease had been raised in 2006 due to arthralgias, a history of idiopathic pericarditis, and increased pigmentation of her palms, and she was referred to [name] but he was unable to confirm a rheumatologic diagnosis.  . Pinguecula   . Renal failure    Formerly on hemodialysis; managed by Dr. Corliss Parish  . Stiffness of joint, not elsewhere classified, hand   . Stroke [facility]) 2007   "when my kidneys went out & I was in a coma; weak LUE/LLE since"  . Subjective tinnitus of both ears 01/24/2018  . Thigh pain   . Vitreous detachment    "no OR"    Past Surgical History:  Procedure Laterality Date  . ABDOMINAL HYSTERECTOMY  1960's  . ARTERIOVENOUS GRAFT  PLACEMENT Left 11/28/2005   forearm arteriovenous  . CARDIAC CATHETERIZATION  01/2005   [name] 05/15/2010  . HALLUX VALGUS CORRECTION Left 01/2012   foot  . SUBXYPHOID PERICARDIAL WINDOW  11/01/2004  . THROMBECTOMY Left  03/06/2006   Simple thrombectomy arteriovenous Gore-Tex graft, left arm, with exploration of venous end and intraoperative shuntogram.         . THROMBECTOMY / ARTERIOVENOUS GRAFT REVISION Left 01/21/2006   Thrombectomy and revision of left forearm loop AV graft.  . TUBAL LIGATION  1960's   "before hysterectomy"   Current Medications: Current Meds  Medication Sig  . allopurinol (ZYLOPRIM) 100 MG  tablet Take 1.5 tablets (150 mg total) by mouth daily.  [name] amLODipine (NORVASC) 10 MG tablet TAKE 1 TABLET BY MOUTH EVERY NIGHT AT BEDTIME  . aspirin EC 81 MG tablet Take 81 mg by mouth at bedtime. Swallow whole.  [name] buPROPion (WELLBUTRIN XL) 300 MG 24 hr tablet TAKE 1 TABLET(300 MG) BY MOUTH DAILY  . furosemide (LASIX) 80 MG tablet TAKE 1/2 TABLET BY MOUTH DAILY  . metoprolol tartrate (LOPRESSOR) 25 MG tablet Take 0.5 tablets (12.5 mg total) by mouth 2 (two) times daily.  . Multiple Vitamins-Minerals (MULTIVITAMIN WITH MINERALS) tablet Take 1 tablet by mouth daily. Centrum Silver  . omeprazole (PRILOSEC) 20 MG capsule Take 1 capsule (20 mg total) by mouth 2 (two) times daily.  . SENSIPAR 30 MG tablet TAKE 1 TABLET BY MOUTH ONCE DAILY  . Vitamins-Lipotropics (LIPOFLAVONOID) TABS Take 1 tablet by mouth 3 (three) times daily as needed (tinnitus).    Allergies:   Ibuprofen   Social History   Socioeconomic History  . Marital status: Legally Separated    Spouse name: Not on file  . Number of children: Not on file  . Years of education: Not on file  . Highest education level: Not on file  Occupational History  . Not on file  Tobacco Use  . Smoking status: Former Smoker    Packs/day: 0.10    Years: 1.00    Pack years: 0.10    Types: Cigarettes    Quit date: 01/02/1960    Years since quitting: 59.8  . Smokeless tobacco: Never Used  . Tobacco comment: patient stated has never smoked   Vaping Use  . Vaping Use: Never used  Substance and Sexual Activity  . Alcohol use: No    Alcohol/week: 0.0 standard drinks  . Drug use: No  . Sexual activity: Not on file  Other Topics Concern  . Not on file  Social History Narrative  . Not on file   Social Determinants of Health   Financial Resource Strain:   . Difficulty of Paying Living Expenses: Not on file  Food Insecurity:   . Worried About Charity fundraiser in the Last Year: Not on file  . Ran Out of Food in the Last Year: Not on file   Transportation Needs:   . Lack of Transportation (Medical): Not on file  . Lack of Transportation (Non-Medical): Not on file  Physical Activity:   . Days of Exercise per Week: Not on file  . Minutes of Exercise per Session: Not on file  Stress:   . Feeling of Stress : Not on file  Social Connections:   . Frequency of Communication with Friends and Family: Not on file  . Frequency of Social Gatherings with Friends and Family: Not on file  . Attends Religious Services: Not on file  . Active Member of Clubs or  Organizations: Not on file  . Attends Archivist Meetings: Not on file  . Marital Status: Not on file    Family History: The patient's family history includes Diabetes in her brother; Heart disease in her father; Hypertension in her brother. There is no history of Breast cancer, Colon cancer, Lung cancer, Cervical cancer, or Sickle cell anemia. All but two members of her family are decreased.  Has eight children.  ROS:   Please see the history of present illness.    All other systems reviewed and are negative.  EKGs/Labs/Other Studies Reviewed:    The following studies were reviewed today:  EKG:  07/10/19 Likely sinus rhythm (upright p wave morphology V1) rate 68   Recent Labs: 07/05/2019: ALT 19; BUN 51; Creatinine, Ser 3.03; Hemoglobin 12.4; Platelets 193; Potassium 4.4; Sodium 138  Recent Lipid Panel    Component Value Date/Time   CHOL 203 (H) 05/24/2018 1435   CHOL 184 01/24/2015 1410   TRIG 84 05/24/2018 1435   HDL 81 05/24/2018 1435   HDL 85 01/24/2015 1410   CHOLHDL 2.5 05/24/2018 1435   VLDL 17 05/24/2018 1435   LDLCALC 105 (H) 05/24/2018 1435   LDLCALC 77 01/24/2015 1410   05/31/2017  Echo Normal EF Mild MR Left ventricle: The cavity size was normal. Systolic function was  normal. The estimated ejection fraction was in the range of 55%  to 60%. Wall motion was normal; there were no regional wall  motion abnormalities. Left ventricular  diastolic function  parameters were normal.  - Mitral valve: Calcified annulus. There was mild regurgitation.  Physical Exam:    VS:  BP (!) 160/60   Pulse 64   Ht 5\' 2"  (1.575 m)   Wt 144 lb (65.3 kg)   SpO2 94%   BMI 26.34 kg/m     Wt Readings from Last 3 Encounters:  10/30/19 144 lb (65.3 kg)  03/10/19 148 lb 4.8 oz (67.3 kg)  02/22/19 156 lb (70.8 kg)    GEN: Well nourished, well developed in no acute distress HEENT: Normal NECK: No JVD; No carotid bruits LYMPHATICS: No lymphadenopathy CARDIAC: RRR, no murmurs, rubs, gallops; fistula is bruit but with no thrill RESPIRATORY:  Clear to auscultation without rales, wheezing or rhonchi  ABDOMEN: Soft, non-tender, non-distended MUSCULOSKELETAL:  No edema; No deformity  SKIN: Warm and dry NEUROLOGIC:  Alert and oriented x 3 PSYCHIATRIC:  Normal affect   ASSESSMENT:    1. Essential hypertension   2. Nonrheumatic mitral valve regurgitation   3. Benign hypertensive heart and kidney disease and CKD stage V (HCC)    PLAN:    In order of problems listed above:  Essential Hypertension Mild Mitral Regurgitation End stage renal disease not on HD - we will keep your same medication except:  Change metoprolol tartrate to labetalol 100 mg BID - will start ambulatory blood pressure and discussed 135/85 - discussed diet (DASH/low sodium), and exercise/weight loss interventions  - get echocardiogram for evaluation. - discussed long term that changing her medications may end up needing HD and that she should think of long term if she wants that   6 months follow up unless new symptoms or abnormal test results warranting change in plan  Would be reasonable for Virtual Follow up Would be reasonable for APP Follow up   Medication Adjustments/Labs and Tests Ordered: Current medicines are reviewed at length with the patient today.  Concerns regarding medicines are outlined above.  No orders of the defined types were  placed in this  encounter.  No orders of the defined types were placed in this encounter.   There are no Patient Instructions on file for this visit.   Signed, Werner Lean, MD  10/30/2019 10:22 AM    [facility]

## 2019-11-05 ENCOUNTER — Emergency Department (HOSPITAL_COMMUNITY): Payer: Medicare Other

## 2019-11-05 ENCOUNTER — Other Ambulatory Visit: Payer: Self-pay

## 2019-11-05 ENCOUNTER — Encounter (HOSPITAL_COMMUNITY): Payer: Self-pay | Admitting: Emergency Medicine

## 2019-11-05 ENCOUNTER — Emergency Department (HOSPITAL_COMMUNITY)
Admission: EM | Admit: 2019-11-05 | Discharge: 2019-11-05 | Disposition: A | Discharge status: Home / Self Care | Payer: Medicare Other | Attending: Emergency Medicine | Admitting: Emergency Medicine

## 2019-11-05 DIAGNOSIS — Z87891 Personal history of nicotine dependence: Secondary | ICD-10-CM | POA: Diagnosis not present

## 2019-11-05 DIAGNOSIS — I120 Hypertensive chronic kidney disease with stage 5 chronic kidney disease or end stage renal disease: Secondary | ICD-10-CM | POA: Diagnosis not present

## 2019-11-05 DIAGNOSIS — N289 Disorder of kidney and ureter, unspecified: Secondary | ICD-10-CM | POA: Insufficient documentation

## 2019-11-05 DIAGNOSIS — I10 Essential (primary) hypertension: Secondary | ICD-10-CM

## 2019-11-05 DIAGNOSIS — Z79899 Other long term (current) drug therapy: Secondary | ICD-10-CM | POA: Diagnosis not present

## 2019-11-05 DIAGNOSIS — Z7982 Long term (current) use of aspirin: Secondary | ICD-10-CM | POA: Insufficient documentation

## 2019-11-05 DIAGNOSIS — N185 Chronic kidney disease, stage 5: Secondary | ICD-10-CM | POA: Diagnosis not present

## 2019-11-05 DIAGNOSIS — N3001 Acute cystitis with hematuria: Secondary | ICD-10-CM | POA: Diagnosis not present

## 2019-11-05 DIAGNOSIS — R519 Headache, unspecified: Secondary | ICD-10-CM | POA: Diagnosis not present

## 2019-11-05 LAB — URINALYSIS, ROUTINE W REFLEX MICROSCOPIC
Bilirubin Urine: NEGATIVE
Glucose, UA: NEGATIVE mg/dL
Hgb urine dipstick: NEGATIVE
Ketones, ur: NEGATIVE mg/dL
Nitrite: NEGATIVE
Protein, ur: NEGATIVE mg/dL
Specific Gravity, Urine: 1.006 (ref 1.005–1.030)
pH: 6 (ref 5.0–8.0)

## 2019-11-05 LAB — TROPONIN I (HIGH SENSITIVITY)
Troponin I (High Sensitivity): 25 ng/L — ABNORMAL HIGH (ref <18)
Troponin I (High Sensitivity): 29 ng/L — ABNORMAL HIGH (ref <18)

## 2019-11-05 LAB — BASIC METABOLIC PANEL
Anion gap: 9 (ref 5–15)
BUN: 30 mg/dL — ABNORMAL HIGH (ref 8–23)
CO2: 24 mmol/L (ref 22–32)
Calcium: 9.6 mg/dL (ref 8.9–10.3)
Chloride: 105 mmol/L (ref 98–111)
Creatinine, Ser: 2.29 mg/dL — ABNORMAL HIGH (ref 0.44–1.00)
GFR, Estimated: 21 mL/min — ABNORMAL LOW (ref >60)
Glucose, Bld: 98 mg/dL (ref 70–99)
Potassium: 4.7 mmol/L (ref 3.5–5.1)
Sodium: 138 mmol/L (ref 135–145)

## 2019-11-05 LAB — CBC
HCT: 37 % (ref 36.0–46.0)
Hemoglobin: 11.6 g/dL — ABNORMAL LOW (ref 12.0–15.0)
MCH: 31 pg (ref 26.0–34.0)
MCHC: 31.4 g/dL (ref 30.0–36.0)
MCV: 98.9 fL (ref 80.0–100.0)
Platelets: 172 10*3/uL (ref 150–400)
RBC: 3.74 MIL/uL — ABNORMAL LOW (ref 3.87–5.11)
RDW: 14.1 % (ref 11.5–15.5)
WBC: 3.9 10*3/uL — ABNORMAL LOW (ref 4.0–10.5)
nRBC: 0 % (ref 0.0–0.2)

## 2019-11-05 LAB — CBG MONITORING, ED: Glucose-Capillary: 84 mg/dL (ref 70–99)

## 2019-11-05 MED ORDER — CEPHALEXIN 500 MG PO CAPS
500.0000 mg | ORAL_CAPSULE | Freq: Four times a day (QID) | ORAL | 0 refills | Status: DC
Start: 2019-11-05 — End: 2020-03-17

## 2019-11-05 MED ORDER — SODIUM CHLORIDE 0.9 % IV SOLN
1.0000 g | Freq: Once | INTRAVENOUS | Status: AC
  Administered 2019-11-05: 1 g via INTRAVENOUS
  Filled 2019-11-05: qty 10

## 2019-11-05 MED ORDER — ACETAMINOPHEN 500 MG PO TABS
1000.0000 mg | ORAL_TABLET | Freq: Once | ORAL | Status: AC
  Administered 2019-11-05: 1000 mg via ORAL
  Filled 2019-11-05: qty 2

## 2019-11-05 NOTE — Discharge Instructions (Signed)
Please take antibiotics through the urinary tract infection we discovered.  Please take your blood pressure medicine elevated blood pressure.  Please rest.  If any symptoms change or worsen acutely, please return to the nearest emergency department.

## 2019-11-05 NOTE — ED Provider Notes (Signed)
Care assumed from Dr. Randal Buba.   At time of transfer care, patient is waiting for delta troponin. Initial troponin slightly elevated and we are trending to make sure it is not rising precipitously. Patient presented for elevated blood pressure and some lightheadedness with nausea and headache. Patient had CT imaging that was unremarkable and after some pain was controlled Tylenol, blood pressure improved. Patient was found to have urinary tract infection and was given antibiotics and is going to have antibiotics at home for further care.  Second opponent only rose 4 points so slightly more elevated. Given her lack of any chest pain, the kidney disease with hypertension, and a UTI, I have low suspicion this is acute cardiac ischemia. Will discuss with patient but anticipate discharge home to follow-up with PCP.  Patient was reassessed and continues to have no chest pain or shortness of breath.  She is doing much better.  She agrees that she would like to go home and does not want further work-up.  Her troponin only slightly rose which we discussed together.  We agreed to have her follow-up with her PCP and take the antibiotics for the UTI.  She will watch her blood pressure.  It is improved from when she arrived.  Patient was discharged home to follow-up with PCP.    Clinical Impression: 1. Renal insufficiency   2. Hypertension, unspecified type   3. Acute cystitis with hematuria     Disposition: Discharge  Condition: Good  I have discussed the results, Dx and Tx plan with the pt(& family if present). He/she/they expressed understanding and agree(s) with the plan. Discharge instructions discussed at great length. Strict return precautions discussed and pt &/or family have verbalized understanding of the instructions. No further questions at time of discharge.    New Prescriptions   CEPHALEXIN (KEFLEX) 500 MG CAPSULE    Take 1 capsule (500 mg total) by mouth 4 (four) times daily.    Follow  Up: [name], MD [address], [address] [place] [address] [phone]  Schedule an appointment as soon as possible for a visit       Zakaiya Lares, Gwenyth Allegra, MD 11/05/19 1133

## 2019-11-05 NOTE — ED Triage Notes (Signed)
Pt arrived via ems from home due to high blood pressure. Pt reports feeling dizziness/headache/nausea x 2 days that had gotten progressively worse today. Pt initial BP with EMS 200/120

## 2019-11-05 NOTE — ED Provider Notes (Signed)
[facility] EMERGENCY DEPARTMENT Provider Note   CSN: [id number] Arrival date & time: 11/05/19  0430     History Chief Complaint  Patient presents with  . Hypertension    DAENA ALPER is a 85 y.o. female.  The history is provided by the patient.  Hypertension This is a chronic problem. The current episode started more than 1 week ago. The problem occurs constantly. The problem has been rapidly worsening. Associated symptoms include headaches. Pertinent negatives include no chest pain, no abdominal pain and no shortness of breath. Nothing aggravates the symptoms. Nothing relieves the symptoms. Treatments tried: her home medication that have been decreased.   The treatment provided no relief.       Past Medical History:  Diagnosis Date  . Anemia due to blood loss, chronic   . Anemia, iron deficiency   . Angiodysplasia of colon 05/09/2005   Single small angiodysplastic lesion in the descending colon found on colonoscopy 05/09/2005 by [name]  . Arthritis    "knots on my thumbs; left elbow" (03/03/2014)  . Cataracts, bilateral   . Chest pain, non-cardiac 05/30/2017  . Chronic ankle pain    bilateral  . Chronic kidney disease, stage IV (severe) ([place])    [name] 02/09/2014  . Chronic knee pain    bilateral  . Chronic thumb pain   . Depression   . Diverticulitis of large intestine without perforation or abscess without bleeding 05/21/2018  . Ear noise 01/07/2013  . Epicondylitis   . Gastritis, Helicobacter pylori    Noted on EGD 01/18/2009 by [name];  biopsy showed chronic gastritis with Helicobacter pylori, treated by [name].  [name] GERD (gastroesophageal reflux disease)   . Gout   . Hemorrhoids, internal 05/09/2005    Found on colonoscopy 05/09/2005 by [name]  . History of blood transfusion    "low blood count"  . Hypertension   . Osteoporosis, unspecified 03/06/2012   A DXA bone density scan done 02/20/2012 showed osteoporosis  with a lumbar spine T-score of -4.4 and a right femur T-score of -2.8.  Patient has chronic kidney disease with estimated GFR less than 30, and it is not clear how much of her osteoporosis may be due to renal osteodystrophy.    . Pericardial effusion 11/2004   S/P subxiphoid pericardial window 11/01/2004 by [name].  A question of possible collagen vascular disease had been raised in 2006 due to arthralgias, a history of idiopathic pericarditis, and increased pigmentation of her palms, and she was referred to [name] but he was unable to confirm a rheumatologic diagnosis.  . Pinguecula   . Renal failure    Formerly on hemodialysis; managed by Dr. Corliss Parish  . Stiffness of joint, not elsewhere classified, hand   . Stroke [facility]) 2007   "when my kidneys went out & I was in a coma; weak LUE/LLE since"  . Subjective tinnitus of both ears 01/24/2018  . Thigh pain   . Vitreous detachment    "no OR"    Patient Active Problem List   Diagnosis Date Noted  . Nonrheumatic mitral valve regurgitation 10/30/2019  . Benign hypertensive heart and kidney disease and CKD stage V ([place]) 10/30/2019  . Benign positional vertigo 09/21/2016  . Preventative health care 09/11/2016  . Vaginal mass 07/19/2016  . Insomnia 05/03/2015  . GERD (gastroesophageal reflux disease) 05/03/2015  . Fatigue 04/20/2015  . Meningioma ([place]) 03/01/2015  . Tinnitus 01/24/2015  . History of  CVA (cerebrovascular accident) 01/24/2015  . Gout 03/03/2014  . Hearing loss 11/03/2013  . Osteoporosis 03/06/2012  . UTI (urinary tract infection) 03/21/2011  . Depression 08/30/2009  . Iron deficiency anemia 06/01/2009  . Disease of pericardium 06/01/2009  . CKD (chronic kidney disease) stage 4, GFR 15-29 ml/min ([facility]) 06/01/2009  . CATARACTS 10/24/2005  . Essential hypertension 10/24/2005  . ANGIODYSPLASIA, COLON 05/09/2005    Past Surgical History:  Procedure Laterality Date  . ABDOMINAL HYSTERECTOMY   1960's  . ARTERIOVENOUS GRAFT PLACEMENT Left 11/28/2005   forearm arteriovenous  . CARDIAC CATHETERIZATION  01/2005   [name] 05/15/2010  . HALLUX VALGUS CORRECTION Left 01/2012   foot  . SUBXYPHOID PERICARDIAL WINDOW  11/01/2004  . THROMBECTOMY Left  03/06/2006   Simple thrombectomy arteriovenous Gore-Tex graft, left arm, with exploration of venous end and intraoperative shuntogram.         . THROMBECTOMY / ARTERIOVENOUS GRAFT REVISION Left 01/21/2006   Thrombectomy and revision of left forearm loop AV graft.  . TUBAL LIGATION  1960's   "before hysterectomy"     OB History   No obstetric history on file.     Family History  Problem Relation Age of Onset  . Hypertension Brother   . Heart disease Father   . Diabetes Brother   . Breast cancer Neg Hx   . Colon cancer Neg Hx   . Lung cancer Neg Hx   . Cervical cancer Neg Hx   . Sickle cell anemia Neg Hx     Social History   Tobacco Use  . Smoking status: Former Smoker    Packs/day: 0.10    Years: 1.00    Pack years: 0.10    Types: Cigarettes    Quit date: 01/02/1960    Years since quitting: 59.8  . Smokeless tobacco: Never Used  . Tobacco comment: patient stated has never smoked   Vaping Use  . Vaping Use: Never used  Substance Use Topics  . Alcohol use: No    Alcohol/week: 0.0 standard drinks  . Drug use: No    Home Medications Prior to Admission medications   Medication Sig Start Date End Date Taking? Authorizing Provider  allopurinol (ZYLOPRIM) 100 MG tablet Take 1.5 tablets (150 mg total) by mouth daily. 03/10/19   [name], MD  amLODipine (NORVASC) 10 MG tablet TAKE 1 TABLET BY MOUTH EVERY NIGHT AT BEDTIME 07/16/19   [name], MD  aspirin EC 81 MG tablet Take 81 mg by mouth at bedtime. Swallow whole.    [provider]  buPROPion (WELLBUTRIN XL) 300 MG 24 hr tablet TAKE 1 TABLET(300 MG) BY MOUTH DAILY 04/23/19   [name], MD  furosemide (LASIX) 80 MG tablet TAKE 1/2 TABLET BY  MOUTH DAILY 10/14/19   [name], MD  labetalol (NORMODYNE) 100 MG tablet Take 1 tablet (100 mg total) by mouth 2 (two) times daily. 10/30/19   Werner Lean, MD  Multiple Vitamins-Minerals (MULTIVITAMIN WITH MINERALS) tablet Take 1 tablet by mouth daily. Centrum Silver    [provider]  omeprazole (PRILOSEC) 20 MG capsule Take 1 capsule (20 mg total) by mouth 2 (two) times daily. 04/14/19 04/13/20  [name], MD  SENSIPAR 30 MG tablet TAKE 1 TABLET BY MOUTH ONCE DAILY 10/02/16   [name], MD  Vitamins-Lipotropics (LIPOFLAVONOID) TABS Take 1 tablet by mouth 3 (three) times daily as needed (tinnitus). 0/98/11   Delora Fuel, MD  FLUoxetine (PROZAC) 10 MG tablet Take 1 tablet (10 mg total)  by mouth daily. 03/07/11 06/02/18  [name], MD    Allergies    Ibuprofen  Review of Systems   Review of Systems  Constitutional: Negative for fever.  HENT: Negative for congestion.   Eyes: Negative for visual disturbance.  Respiratory: Negative for shortness of breath.   Cardiovascular: Negative for chest pain.  Gastrointestinal: Positive for nausea. Negative for abdominal pain and vomiting.  Genitourinary: Negative for difficulty urinating.  Musculoskeletal: Negative for arthralgias.  Neurological: Positive for light-headedness and headaches. Negative for facial asymmetry and weakness.  Psychiatric/Behavioral: Negative for agitation.  All other systems reviewed and are negative.   Physical Exam Updated Vital Signs BP (!) 166/83   Pulse 61   Temp 98 F (36.7 C) (Oral)   Resp 15   Ht 5\' 2"  (1.575 m)   Wt 65.3 kg   SpO2 100%   BMI 26.33 kg/m   Physical Exam Vitals and nursing note reviewed.  Constitutional:      General: She is not in acute distress.    Appearance: Normal appearance.  HENT:     Head: Normocephalic and atraumatic.     Nose: Nose normal.  Eyes:     Conjunctiva/sclera: Conjunctivae normal.     Pupils: Pupils are equal, round,  and reactive to light.  Cardiovascular:     Rate and Rhythm: Normal rate and regular rhythm.     Pulses: Normal pulses.     Heart sounds: Normal heart sounds.  Pulmonary:     Effort: Pulmonary effort is normal.     Breath sounds: Normal breath sounds.  Abdominal:     General: Abdomen is flat. Bowel sounds are normal.     Palpations: Abdomen is soft.     Tenderness: There is no abdominal tenderness. There is no guarding.  Musculoskeletal:        General: Normal range of motion.     Cervical back: Normal range of motion and neck supple.  Skin:    General: Skin is warm and dry.     Capillary Refill: Capillary refill takes less than 2 seconds.  Neurological:     General: No focal deficit present.     Mental Status: She is alert and oriented to person, place, and time.     Cranial Nerves: No cranial nerve deficit.  Psychiatric:        Mood and Affect: Mood normal.        Behavior: Behavior normal.     ED Results / Procedures / Treatments   Labs (all labs ordered are listed, but only abnormal results are displayed) Results for orders placed or performed during the hospital encounter of 23/30/07  Basic metabolic panel  Result Value Ref Range   Sodium 138 135 - 145 mmol/L   Potassium 4.7 3.5 - 5.1 mmol/L   Chloride 105 98 - 111 mmol/L   CO2 24 22 - 32 mmol/L   Glucose, Bld 98 70 - 99 mg/dL   BUN 30 (H) 8 - 23 mg/dL   Creatinine, Ser 2.29 (H) 0.44 - 1.00 mg/dL   Calcium 9.6 8.9 - 10.3 mg/dL   GFR, Estimated 21 (L) >60 mL/min   Anion gap 9 5 - 15  CBC  Result Value Ref Range   WBC 3.9 (L) 4.0 - 10.5 K/uL   RBC 3.74 (L) 3.87 - 5.11 MIL/uL   Hemoglobin 11.6 (L) 12.0 - 15.0 g/dL   HCT 37.0 36 - 46 %   MCV 98.9 80.0 - 100.0 fL  MCH 31.0 26.0 - 34.0 pg   MCHC 31.4 30.0 - 36.0 g/dL   RDW 14.1 11.5 - 15.5 %   Platelets 172 150 - 400 K/uL   nRBC 0.0 0.0 - 0.2 %  Urinalysis, Routine w reflex microscopic Urine, Clean Catch  Result Value Ref Range   Color, Urine YELLOW YELLOW    APPearance HAZY (A) CLEAR   Specific Gravity, Urine 1.006 1.005 - 1.030   pH 6.0 5.0 - 8.0   Glucose, UA NEGATIVE NEGATIVE mg/dL   Hgb urine dipstick NEGATIVE NEGATIVE   Bilirubin Urine NEGATIVE NEGATIVE   Ketones, ur NEGATIVE NEGATIVE mg/dL   Protein, ur NEGATIVE NEGATIVE mg/dL   Nitrite NEGATIVE NEGATIVE   Leukocytes,Ua MODERATE (A) NEGATIVE   RBC / HPF 0-5 0 - 5 RBC/hpf   WBC, UA 0-5 0 - 5 WBC/hpf   Bacteria, UA RARE (A) NONE SEEN   Squamous Epithelial / LPF 6-10 0 - 5  CBG monitoring, ED  Result Value Ref Range   Glucose-Capillary 84 70 - 99 mg/dL  Troponin I (High Sensitivity)  Result Value Ref Range   Troponin I (High Sensitivity) 25 (H) <18 ng/L   CT Head Wo Contrast  Result Date: 11/05/2019 CLINICAL DATA:  Renovascular hypertension. High blood pressure. Renal insufficiency. Dizziness, headache and nausea for 2 days which has gotten progressively worse. EXAM: CT HEAD WITHOUT CONTRAST TECHNIQUE: Contiguous axial images were obtained from the base of the skull through the vertex without intravenous contrast. COMPARISON:  MRI head 07/05/2019 FINDINGS: Brain: No evidence of acute infarction, hemorrhage, hydrocephalus, or extra-axial fluid collection. Anterior parafalcine meningioma is again identified measuring 2.8 cm, image 10/3. Unchanged from previous exam. There is mild diffuse low-attenuation within the subcortical and periventricular white matter compatible with chronic microvascular disease. Remote left cerebellar hemisphere infarct is unchanged, image 10/3. Vascular: No hyperdense vessel or unexpected calcification. Skull: Normal. Negative for fracture or focal lesion. Sinuses/Orbits: The paranasal sinuses and mastoid air cells are clear. Other: None. IMPRESSION: 1. No acute intracranial abnormalities. 2. Chronic small vessel ischemic disease and brain atrophy. 3. Stable appearance of anterior parafalcine meningioma. Electronically Signed   By: Kerby Moors M.D.   On: 11/05/2019  05:53   DG Chest Portable 1 View  Result Date: 11/05/2019 CLINICAL DATA:  Dizziness.  Hypertension. EXAM: PORTABLE CHEST 1 VIEW COMPARISON:  05/16/2018. FINDINGS: Mediastinum and hilar structures normal. Cardiomegaly. No pulmonary venous congestion. Mild left mid lung infiltrate cannot be excluded. Right-sided pleuroparenchymal thickening again noted consistent scarring. No pleural effusion or pneumothorax. Thoracic spine scoliosis and degenerative change. IMPRESSION: 1. Cardiomegaly. No pulmonary venous congestion. 2. Mild left mid lung infiltrate cannot be excluded. Electronically Signed   By: Marcello Moores  Register   On: 11/05/2019 05:35   MM 3D SCREEN BREAST BILATERAL  Result Date: 10/29/2019 CLINICAL DATA:  Screening. EXAM: DIGITAL SCREENING BILATERAL MAMMOGRAM WITH TOMO AND CAD COMPARISON:  Previous exam(s). ACR Breast Density Category b: There are scattered areas of fibroglandular density. FINDINGS: There are no findings suspicious for malignancy. Images were processed with CAD. IMPRESSION: No mammographic evidence of malignancy. A result letter of this screening mammogram will be mailed directly to the patient. RECOMMENDATION: Screening mammogram in one year. (Code:SM-B-01Y) BI-RADS CATEGORY  1: Negative. Electronically Signed   By: Evangeline Dakin M.D.   On: 10/29/2019 07:47    EKG EKG Interpretation  Date/Time:  Thursday November 05 2019 04:34:45 EDT Ventricular Rate:  75 PR Interval:    QRS Duration: 96 QT Interval:  384 QTC  Calculation: 429 R Axis:   3 Text Interpretation: Sinus rhythm Consider left atrial enlargement Confirmed by Randal Buba, Faruq Rosenberger ([id number]) on 11/05/2019 5:19:16 AM   Radiology CT Head Wo Contrast  Result Date: 11/05/2019 CLINICAL DATA:  Renovascular hypertension. High blood pressure. Renal insufficiency. Dizziness, headache and nausea for 2 days which has gotten progressively worse. EXAM: CT HEAD WITHOUT CONTRAST TECHNIQUE: Contiguous axial images were obtained from the  base of the skull through the vertex without intravenous contrast. COMPARISON:  MRI head 07/05/2019 FINDINGS: Brain: No evidence of acute infarction, hemorrhage, hydrocephalus, or extra-axial fluid collection. Anterior parafalcine meningioma is again identified measuring 2.8 cm, image 10/3. Unchanged from previous exam. There is mild diffuse low-attenuation within the subcortical and periventricular white matter compatible with chronic microvascular disease. Remote left cerebellar hemisphere infarct is unchanged, image 10/3. Vascular: No hyperdense vessel or unexpected calcification. Skull: Normal. Negative for fracture or focal lesion. Sinuses/Orbits: The paranasal sinuses and mastoid air cells are clear. Other: None. IMPRESSION: 1. No acute intracranial abnormalities. 2. Chronic small vessel ischemic disease and brain atrophy. 3. Stable appearance of anterior parafalcine meningioma. Electronically Signed   By: Kerby Moors M.D.   On: 11/05/2019 05:53   DG Chest Portable 1 View  Result Date: 11/05/2019 CLINICAL DATA:  Dizziness.  Hypertension. EXAM: PORTABLE CHEST 1 VIEW COMPARISON:  05/16/2018. FINDINGS: Mediastinum and hilar structures normal. Cardiomegaly. No pulmonary venous congestion. Mild left mid lung infiltrate cannot be excluded. Right-sided pleuroparenchymal thickening again noted consistent scarring. No pleural effusion or pneumothorax. Thoracic spine scoliosis and degenerative change. IMPRESSION: 1. Cardiomegaly. No pulmonary venous congestion. 2. Mild left mid lung infiltrate cannot be excluded. Electronically Signed   By: Marcello Moores  Register   On: 11/05/2019 05:35    Procedures Procedures (including critical care time)  Medications Ordered in ED Medications  acetaminophen (TYLENOL) tablet 1,000 mg (1,000 mg Oral Given 11/05/19 0559)    ED Course  I have reviewed the triage vital signs and the nursing notes.  Pertinent labs & imaging results that were available during my care of the  patient were reviewed by me and considered in my medical decision making (see chart for details).   Patient with HTN, no chest pain.  No SOB, but some lightheadedness and nausea as well as headache.  CT of the head is unremarkable and BP has decreased 40 points systolic with tylenol alone.  No indication for further lowering emergently based on the [place] 7 there is no indication for emergent lowering.     Will need a repeat troponin and possible cardiology consult.  Signed out to Dr. Sherry Ruffing pending second troponin and reassessment    Final Clinical Impression(s) / ED Diagnoses    Tykiera Raven, MD 11/05/19 [id number]

## 2019-11-20 ENCOUNTER — Other Ambulatory Visit: Payer: Self-pay

## 2019-11-20 ENCOUNTER — Ambulatory Visit (HOSPITAL_COMMUNITY): Payer: Medicare Other | Attending: Internal Medicine

## 2019-11-20 DIAGNOSIS — N185 Chronic kidney disease, stage 5: Secondary | ICD-10-CM | POA: Insufficient documentation

## 2019-11-20 DIAGNOSIS — I340 Nonrheumatic mitral (valve) insufficiency: Secondary | ICD-10-CM

## 2019-11-20 DIAGNOSIS — I1311 Hypertensive heart and chronic kidney disease without heart failure, with stage 5 chronic kidney disease, or end stage renal disease: Secondary | ICD-10-CM | POA: Insufficient documentation

## 2019-11-20 DIAGNOSIS — I10 Essential (primary) hypertension: Secondary | ICD-10-CM | POA: Diagnosis not present

## 2019-11-20 LAB — ECHOCARDIOGRAM COMPLETE
Area-P 1/2: 3.95 cm2
P 1/2 time: 523 msec
S' Lateral: 2.5 cm

## 2019-11-22 ENCOUNTER — Other Ambulatory Visit: Payer: Self-pay | Admitting: Internal Medicine

## 2019-11-22 DIAGNOSIS — F32A Depression, unspecified: Secondary | ICD-10-CM

## 2019-11-25 ENCOUNTER — Telehealth: Payer: Self-pay

## 2019-11-25 NOTE — Telephone Encounter (Signed)
The patient has been notified of the result and verbalized understanding.  All questions (if any) were answered.    Patient states she will have her granddaughter call the office back on Monday to schedule an appointment for her with Dr. Gasper Sells.  Wilma Flavin, RN 11/25/2019 9:03 AM

## 2019-11-25 NOTE — Telephone Encounter (Signed)
-----   Message from Werner Lean, MD sent at 11/22/2019  5:43 PM EST ----- Results: Recent ED visit, HTN issues, and small wall motion abnormality in a non-vascular territory Plan: Will bring patient back for sooner visits for BP control, and discuss ischemic work up in the setting risks and benefits  Werner Lean, MD

## 2019-12-09 ENCOUNTER — Ambulatory Visit (INDEPENDENT_AMBULATORY_CARE_PROVIDER_SITE_OTHER): Payer: Medicare Other | Admitting: Internal Medicine

## 2019-12-09 ENCOUNTER — Other Ambulatory Visit: Payer: Self-pay

## 2019-12-09 ENCOUNTER — Encounter: Payer: Self-pay | Admitting: Internal Medicine

## 2019-12-09 VITALS — BP 160/84 | HR 74 | Ht 62.0 in | Wt 144.0 lb

## 2019-12-09 DIAGNOSIS — I1311 Hypertensive heart and chronic kidney disease without heart failure, with stage 5 chronic kidney disease, or end stage renal disease: Secondary | ICD-10-CM | POA: Diagnosis not present

## 2019-12-09 DIAGNOSIS — I361 Nonrheumatic tricuspid (valve) insufficiency: Secondary | ICD-10-CM | POA: Insufficient documentation

## 2019-12-09 DIAGNOSIS — I340 Nonrheumatic mitral (valve) insufficiency: Secondary | ICD-10-CM

## 2019-12-09 DIAGNOSIS — I10 Essential (primary) hypertension: Secondary | ICD-10-CM | POA: Diagnosis not present

## 2019-12-09 DIAGNOSIS — N185 Chronic kidney disease, stage 5: Secondary | ICD-10-CM

## 2019-12-09 MED ORDER — LABETALOL HCL 200 MG PO TABS: 200.0000 mg | ORAL_TABLET | Freq: Two times a day (BID) | ORAL | 3 refills | Status: DC

## 2019-12-09 NOTE — Progress Notes (Signed)
Cardiology Office Note:    Date:  12/09/2019   ID:  [name], DOB November 26, 1934, MRN [id number]  PCP:  [name], MD  [facility] Cardiologist:  Werner Lean, MD   CC:  Follow up HTN  History of Present Illness:    [name] is a 85 y.o. female with a HTN, Mild mitral regurgitation, CKD Stage V not on HD, prior CVA 2017, question of collagen vascular disease not confirmed by rheumatology, who presented for follow up for HTN 10/30/19.  Started on Labetalol and with ambulatory blood pressure monitoring.  Patient notes that she is doing well.  But occasional feels tired.  Discussion with patient and granddaughter notable that she doesn't take her medication every day.  Since last visit has cystitis eval  in the ED and had elevation in novel troponin 25->29. Relevant interval testing includes echocardiogram with subtle WMA.    No chest pain or pressure.  No SOB/DOE and no PND/Orthopnea.  No weight gain or leg swelling.  No palpitations or syncope.  Ambulatory blood pressure not done  Past Medical History:  Diagnosis Date  . Anemia due to blood loss, chronic   . Anemia, iron deficiency   . Angiodysplasia of colon 05/09/2005   Single small angiodysplastic lesion in the descending colon found on colonoscopy 05/09/2005 by [name]  . Arthritis    "knots on my thumbs; left elbow" (03/03/2014)  . Cataracts, bilateral   . Chest pain, non-cardiac 05/30/2017  . Chronic ankle pain    bilateral  . Chronic kidney disease, stage IV (severe) ([place])    [name] 02/09/2014  . Chronic knee pain    bilateral  . Chronic thumb pain   . Depression   . Diverticulitis of large intestine without perforation or abscess without bleeding 05/21/2018  . Ear noise 01/07/2013  . Epicondylitis   . Gastritis, Helicobacter pylori    Noted on EGD 01/18/2009 by [name];  biopsy showed chronic gastritis with Helicobacter pylori, treated by [name].  [name] GERD (gastroesophageal  reflux disease)   . Gout   . Hemorrhoids, internal 05/09/2005    Found on colonoscopy 05/09/2005 by [name]  . History of blood transfusion    "low blood count"  . Hypertension   . Osteoporosis, unspecified 03/06/2012   A DXA bone density scan done 02/20/2012 showed osteoporosis with a lumbar spine T-score of -4.4 and a right femur T-score of -2.8.  Patient has chronic kidney disease with estimated GFR less than 30, and it is not clear how much of her osteoporosis may be due to renal osteodystrophy.    . Pericardial effusion 11/2004   S/P subxiphoid pericardial window 11/01/2004 by [name].  A question of possible collagen vascular disease had been raised in 2006 due to arthralgias, a history of idiopathic pericarditis, and increased pigmentation of her palms, and she was referred to [name] but he was unable to confirm a rheumatologic diagnosis.  . Pinguecula   . Renal failure    Formerly on hemodialysis; managed by Dr. Corliss Parish  . Stiffness of joint, not elsewhere classified, hand   . Stroke [facility]) 2007   "when my kidneys went out & I was in a coma; weak LUE/LLE since"  . Subjective tinnitus of both ears 01/24/2018  . Thigh pain   . Vitreous detachment    "no OR"    Past Surgical History:  Procedure Laterality Date  . ABDOMINAL HYSTERECTOMY  1960's  .  ARTERIOVENOUS GRAFT PLACEMENT Left 11/28/2005   forearm arteriovenous  . CARDIAC CATHETERIZATION  01/2005   [name] 05/15/2010  . HALLUX VALGUS CORRECTION Left 01/2012   foot  . SUBXYPHOID PERICARDIAL WINDOW  11/01/2004  . THROMBECTOMY Left  03/06/2006   Simple thrombectomy arteriovenous Gore-Tex graft, left arm, with exploration of venous end and intraoperative shuntogram.         . THROMBECTOMY / ARTERIOVENOUS GRAFT REVISION Left 01/21/2006   Thrombectomy and revision of left forearm loop AV graft.  . TUBAL LIGATION  1960's   "before hysterectomy"   Current Medications: Current Meds   Medication Sig  . amLODipine (NORVASC) 10 MG tablet TAKE 1 TABLET BY MOUTH EVERY NIGHT AT BEDTIME (Patient taking differently: Take 10 mg by mouth at bedtime. )  . aspirin EC 81 MG tablet Take 81 mg by mouth at bedtime.   [name] buPROPion (WELLBUTRIN XL) 300 MG 24 hr tablet TAKE 1 TABLET(300 MG) BY MOUTH DAILY  . furosemide (LASIX) 80 MG tablet TAKE 1/2 TABLET BY MOUTH DAILY (Patient taking differently: Take 80 mg by mouth daily. )  . Multiple Vitamins-Minerals (MULTIVITAMIN WITH MINERALS) tablet Take 1 tablet by mouth daily.   [name] omeprazole (PRILOSEC) 20 MG capsule Take 1 capsule (20 mg total) by mouth 2 (two) times daily. (Patient taking differently: Take 20 mg by mouth 2 (two) times daily as needed (heartburn). )  . [DISCONTINUED] labetalol (NORMODYNE) 100 MG tablet Take 1 tablet (100 mg total) by mouth 2 (two) times daily.    Allergies:   Ibuprofen   Social History   Socioeconomic History  . Marital status: Legally Separated    Spouse name: Not on file  . Number of children: Not on file  . Years of education: Not on file  . Highest education level: Not on file  Occupational History  . Not on file  Tobacco Use  . Smoking status: Former Smoker    Packs/day: 0.10    Years: 1.00    Pack years: 0.10    Types: Cigarettes    Quit date: 01/02/1960    Years since quitting: 59.9  . Smokeless tobacco: Never Used  . Tobacco comment: patient stated has never smoked   Vaping Use  . Vaping Use: Never used  Substance and Sexual Activity  . Alcohol use: No    Alcohol/week: 0.0 standard drinks  . Drug use: No  . Sexual activity: Not on file  Other Topics Concern  . Not on file  Social History Narrative  . Not on file   Social Determinants of Health   Financial Resource Strain:   . Difficulty of Paying Living Expenses: Not on file  Food Insecurity:   . Worried About Charity fundraiser in the Last Year: Not on file  . Ran Out of Food in the Last Year: Not on file  Transportation Needs:    . Lack of Transportation (Medical): Not on file  . Lack of Transportation (Non-Medical): Not on file  Physical Activity:   . Days of Exercise per Week: Not on file  . Minutes of Exercise per Session: Not on file  Stress:   . Feeling of Stress : Not on file  Social Connections:   . Frequency of Communication with Friends and Family: Not on file  . Frequency of Social Gatherings with Friends and Family: Not on file  . Attends Religious Services: Not on file  . Active Member of Clubs or Organizations: Not on file  . Attends Club or  Organization Meetings: Not on file  . Marital Status: Not on file    Family History: The patient's family history includes Diabetes in her brother; Heart disease in her father; Hypertension in her brother. There is no history of Breast cancer, Colon cancer, Lung cancer, Cervical cancer, or Sickle cell anemia. All but two members of her family are decreased.  Has eight children.  ROS:   Please see the history of present illness.    All other systems reviewed and are negative.  EKGs/Labs/Other Studies Reviewed:    The following studies were reviewed today:  EKG:  11/05/19 Sinus Rhythm Rate 74 with LAE 07/10/19 Likely sinus rhythm (upright p wave morphology V1) rate 68   Recent Labs: 07/05/2019: ALT 19 11/05/2019: BUN 30; Creatinine, Ser 2.29; Hemoglobin 11.6; Platelets 172; Potassium 4.7; Sodium 138  Recent Lipid Panel    Component Value Date/Time   CHOL 203 (H) 05/24/2018 1435   CHOL 184 01/24/2015 1410   TRIG 84 05/24/2018 1435   HDL 81 05/24/2018 1435   HDL 85 01/24/2015 1410   CHOLHDL 2.5 05/24/2018 1435   VLDL 17 05/24/2018 1435   LDLCALC 105 (H) 05/24/2018 1435   LDLCALC 77 01/24/2015 1410   11/20/19- Echo IMPRESSIONS- HTN through study, mild to moderate TR, mild MR 1. Left ventricular ejection fraction, by estimation, is 60 to 65%. The  left ventricle has normal function. The left ventricle demonstrates  regional wall motion abnormalities  (see scoring diagram/findings for  description). There is mild left ventricular  hypertrophy of the basal-septal segment. Left ventricular diastolic  parameters are consistent with Grade I diastolic dysfunction (impaired  relaxation). There is moderate hypokinesis of the left ventricular, basal  inferior wall.  2. Right ventricular systolic function is normal. The right ventricular  size is normal. There is mildly elevated pulmonary artery systolic  pressure. The estimated right ventricular systolic pressure is 53.6 mmHg.  3. Left atrial size was moderately dilated.  4. The mitral valve is abnormal. Mild mitral valve regurgitation.  5. Tricuspid valve regurgitation is mild to moderate.  6. The aortic valve is tricuspid. Aortic valve regurgitation is trivial.  7. The inferior vena cava is normal in size with greater than 50%  respiratory variability, suggesting right atrial pressure of 3 mmHg.  8. Aneurysmal IAS. Cannot exclude PFO.   05/31/2017- Echo  Impressions- Normal EF Mild MR Left ventricle: The cavity size was normal. Systolic function was  normal. The estimated ejection fraction was in the range of 55%  to 60%. Wall motion was normal; there were no regional wall  motion abnormalities. Left ventricular diastolic function  parameters were normal.  - Mitral valve: Calcified annulus. There was mild regurgitation.  Physical Exam:    VS:  BP (!) 160/84   Pulse 74   Ht 5\' 2"  (1.575 m)   Wt 144 lb (65.3 kg)   SpO2 97%   BMI 26.34 kg/m     Wt Readings from Last 3 Encounters:  12/09/19 144 lb (65.3 kg)  11/05/19 143 lb 15.4 oz (65.3 kg)  10/30/19 144 lb (65.3 kg)    GEN: Well nourished, well developed in no acute distress HEENT: Normal NECK: No JVD; No carotid bruits LYMPHATICS: No lymphadenopathy CARDIAC: RRR, no murmurs, rubs, gallops; fistula is bruit but with no thrill RESPIRATORY:  Clear to auscultation without rales, wheezing or rhonchi  ABDOMEN:  Soft, non-tender, non-distended MUSCULOSKELETAL:  No edema; No deformity  SKIN: Warm and dry NEUROLOGIC:  Alert and oriented x 3  PSYCHIATRIC:  Normal affect   ASSESSMENT:    1. Essential hypertension   2. Benign hypertensive heart and kidney disease and CKD stage V ([place])   3. Nonrheumatic mitral valve regurgitation   4. Nonrheumatic tricuspid valve regurgitation    PLAN:    In order of problems listed above:  Essential Hypertension CKD Stage IV-V - ambulatory blood pressure not done, will start ambulatory BP monitoring; gave education on how to perform ambulatory blood pressure monitoring including the frequency and technique; goal ambulatory blood pressure < 135/85 on average - continue home medications and will increase labetalol to 200 mg PO BID - discussed diet (DASH/low sodium), and exercise/weight loss interventions  - discussed that risk of kidney dysfunction  Mitral Regurgitation- Mild Tricuspid Regurgitation- Mild to Moderate -Continue 40 mg PO Lasix Daily Diuresis  [place] Abnormality - Discussed at length the risks and benefits of LHC for evaluation of inferobasal WMA in the setting or CKD; discussed with granddaughter and patient; will defer presently given risk of ESRD and patient needing prior HD  4-5 months follow up unless new symptoms or abnormal test results warranting change in plan  Would be reasonable for  APP Follow up  Medication Adjustments/Labs and Tests Ordered: Current medicines are reviewed at length with the patient today.  Concerns regarding medicines are outlined above.  No orders of the defined types were placed in this encounter.  Meds ordered this encounter  Medications  . labetalol (NORMODYNE) 200 MG tablet    Sig: Take 1 tablet (200 mg total) by mouth 2 (two) times daily.    Dispense:  180 tablet    Refill:  3    PT WOULD LIKE TO START GETTING PILL PACKS    Patient Instructions  Medication Instructions:  START LABETALOL 200  MG TWICE A DAY  *If you need a refill on your cardiac medications before your next appointment, please call your pharmacy*   Lab Work: NONE If you have labs (blood work) drawn today and your tests are completely normal, you will receive your results only by: [name] MyChart Message (if you have MyChart) OR . A paper copy in the mail If you have any lab test that is abnormal or we need to change your treatment, we will call you to review the results.   Testing/Procedures: NONE   Follow-Up: At [facility], you and your health needs are our priority.  As part of our continuing mission to provide you with exceptional heart care, we have created designated Provider Care Teams.  These Care Teams include your primary Cardiologist (physician) and Advanced Practice Providers (APPs -  Physician Assistants and Nurse Practitioners) who all work together to provide you with the care you need, when you need it.  We recommend signing up for the patient portal called "MyChart".  Sign up information is provided on this After Visit Summary.  MyChart is used to connect with patients for Virtual Visits (Telemedicine).  Patients are able to view lab/test results, encounter notes, upcoming appointments, etc.  Non-urgent messages can be sent to your provider as well.   To learn more about what you can do with  MyChart, go to [email or web address].    Your next appointment:   4 month(s)    The format for your next appointment:   In Person  Provider:   Rudean Haskell, MD   Other Instructions      Signed, Werner Lean, MD  12/09/2019 10:56 AM    [place]  [facility]

## 2019-12-09 NOTE — Patient Instructions (Signed)
Medication Instructions:  START LABETALOL 200 MG TWICE A DAY  *If you need a refill on your cardiac medications before your next appointment, please call your pharmacy*   Lab Work: NONE If you have labs (blood work) drawn today and your tests are completely normal, you will receive your results only by: [name] MyChart Message (if you have MyChart) OR . A paper copy in the mail If you have any lab test that is abnormal or we need to change your treatment, we will call you to review the results.   Testing/Procedures: NONE   Follow-Up: At [facility], you and your health needs are our priority.  As part of our continuing mission to provide you with exceptional heart care, we have created designated Provider Care Teams.  These Care Teams include your primary Cardiologist (physician) and Advanced Practice Providers (APPs -  Physician Assistants and Nurse Practitioners) who all work together to provide you with the care you need, when you need it.  We recommend signing up for the patient portal called "MyChart".  Sign up information is provided on this After Visit Summary.  MyChart is used to connect with patients for Virtual Visits (Telemedicine).  Patients are able to view lab/test results, encounter notes, upcoming appointments, etc.  Non-urgent messages can be sent to your provider as well.   To learn more about what you can do with  MyChart, go to [email or web address].    Your next appointment:   4 month(s)    The format for your next appointment:   In Person  Provider:   Rudean Haskell, MD   Other Instructions

## 2019-12-13 ENCOUNTER — Other Ambulatory Visit: Payer: Self-pay | Admitting: Internal Medicine

## 2019-12-13 DIAGNOSIS — K219 Gastro-esophageal reflux disease without esophagitis: Secondary | ICD-10-CM

## 2020-01-12 ENCOUNTER — Other Ambulatory Visit: Payer: Self-pay | Admitting: Internal Medicine

## 2020-01-12 DIAGNOSIS — I10 Essential (primary) hypertension: Secondary | ICD-10-CM

## 2020-01-13 ENCOUNTER — Other Ambulatory Visit: Payer: Self-pay | Admitting: Physician Assistant

## 2020-01-19 ENCOUNTER — Other Ambulatory Visit: Payer: Medicare Other

## 2020-03-17 ENCOUNTER — Other Ambulatory Visit: Payer: Self-pay

## 2020-03-17 ENCOUNTER — Emergency Department (HOSPITAL_COMMUNITY)
Admission: EM | Admit: 2020-03-17 | Discharge: 2020-03-17 | Disposition: A | Discharge status: Home / Self Care | Payer: 59 | Attending: Emergency Medicine | Admitting: Emergency Medicine

## 2020-03-17 ENCOUNTER — Encounter (HOSPITAL_COMMUNITY): Payer: Self-pay | Admitting: Emergency Medicine

## 2020-03-17 DIAGNOSIS — R42 Dizziness and giddiness: Secondary | ICD-10-CM | POA: Diagnosis present

## 2020-03-17 DIAGNOSIS — R112 Nausea with vomiting, unspecified: Secondary | ICD-10-CM | POA: Insufficient documentation

## 2020-03-17 DIAGNOSIS — Z7982 Long term (current) use of aspirin: Secondary | ICD-10-CM | POA: Diagnosis not present

## 2020-03-17 DIAGNOSIS — Z79899 Other long term (current) drug therapy: Secondary | ICD-10-CM | POA: Diagnosis not present

## 2020-03-17 DIAGNOSIS — N3000 Acute cystitis without hematuria: Secondary | ICD-10-CM

## 2020-03-17 DIAGNOSIS — N185 Chronic kidney disease, stage 5: Secondary | ICD-10-CM | POA: Diagnosis not present

## 2020-03-17 DIAGNOSIS — N184 Chronic kidney disease, stage 4 (severe): Secondary | ICD-10-CM

## 2020-03-17 DIAGNOSIS — I120 Hypertensive chronic kidney disease with stage 5 chronic kidney disease or end stage renal disease: Secondary | ICD-10-CM | POA: Insufficient documentation

## 2020-03-17 DIAGNOSIS — Z87891 Personal history of nicotine dependence: Secondary | ICD-10-CM | POA: Diagnosis not present

## 2020-03-17 LAB — CBC WITH DIFFERENTIAL/PLATELET
Abs Immature Granulocytes: 0.01 10*3/uL (ref 0.00–0.07)
Basophils Absolute: 0 10*3/uL (ref 0.0–0.1)
Basophils Relative: 0 %
Eosinophils Absolute: 0.3 10*3/uL (ref 0.0–0.5)
Eosinophils Relative: 5 %
HCT: 35.1 % — ABNORMAL LOW (ref 36.0–46.0)
Hemoglobin: 11 g/dL — ABNORMAL LOW (ref 12.0–15.0)
Immature Granulocytes: 0 %
Lymphocytes Relative: 24 %
Lymphs Abs: 1.3 10*3/uL (ref 0.7–4.0)
MCH: 31.7 pg (ref 26.0–34.0)
MCHC: 31.3 g/dL (ref 30.0–36.0)
MCV: 101.2 fL — ABNORMAL HIGH (ref 80.0–100.0)
Monocytes Absolute: 0.4 10*3/uL (ref 0.1–1.0)
Monocytes Relative: 7 %
Neutro Abs: 3.4 10*3/uL (ref 1.7–7.7)
Neutrophils Relative %: 64 %
Platelets: 160 10*3/uL (ref 150–400)
RBC: 3.47 MIL/uL — ABNORMAL LOW (ref 3.87–5.11)
RDW: 13.3 % (ref 11.5–15.5)
WBC: 5.4 10*3/uL (ref 4.0–10.5)
nRBC: 0 % (ref 0.0–0.2)

## 2020-03-17 LAB — COMPREHENSIVE METABOLIC PANEL
ALT: 19 U/L (ref 0–44)
AST: 20 U/L (ref 15–41)
Albumin: 3.6 g/dL (ref 3.5–5.0)
Alkaline Phosphatase: 130 U/L — ABNORMAL HIGH (ref 38–126)
Anion gap: 9 (ref 5–15)
BUN: 43 mg/dL — ABNORMAL HIGH (ref 8–23)
CO2: 26 mmol/L (ref 22–32)
Calcium: 9.3 mg/dL (ref 8.9–10.3)
Chloride: 104 mmol/L (ref 98–111)
Creatinine, Ser: 2.89 mg/dL — ABNORMAL HIGH (ref 0.44–1.00)
GFR, Estimated: 16 mL/min — ABNORMAL LOW (ref >60)
Glucose, Bld: 88 mg/dL (ref 70–99)
Potassium: 4.8 mmol/L (ref 3.5–5.1)
Sodium: 139 mmol/L (ref 135–145)
Total Bilirubin: 0.9 mg/dL (ref 0.3–1.2)
Total Protein: 6.5 g/dL (ref 6.5–8.1)

## 2020-03-17 LAB — URINALYSIS, ROUTINE W REFLEX MICROSCOPIC
Bilirubin Urine: NEGATIVE
Glucose, UA: NEGATIVE mg/dL
Ketones, ur: NEGATIVE mg/dL
Nitrite: NEGATIVE
Protein, ur: NEGATIVE mg/dL
Specific Gravity, Urine: 1.008 (ref 1.005–1.030)
pH: 7 (ref 5.0–8.0)

## 2020-03-17 LAB — LIPASE, BLOOD: Lipase: 51 U/L (ref 11–51)

## 2020-03-17 LAB — TROPONIN I (HIGH SENSITIVITY): Troponin I (High Sensitivity): 28 ng/L — ABNORMAL HIGH (ref <18)

## 2020-03-17 MED ORDER — ONDANSETRON 4 MG PO TBDP: 4.0000 mg | ORAL_TABLET | Freq: Three times a day (TID) | ORAL | 0 refills | Status: DC | PRN

## 2020-03-17 MED ORDER — CEPHALEXIN 500 MG PO CAPS: 500.0000 mg | ORAL_CAPSULE | Freq: Four times a day (QID) | ORAL | 0 refills | Status: DC

## 2020-03-17 MED ORDER — SODIUM CHLORIDE 0.9 % IV BOLUS
500.0000 mL | Freq: Once | INTRAVENOUS | Status: AC
  Administered 2020-03-17: 500 mL via INTRAVENOUS

## 2020-03-17 MED ORDER — MECLIZINE HCL 25 MG PO TABS
12.5000 mg | ORAL_TABLET | Freq: Once | ORAL | Status: AC
  Administered 2020-03-17: 12.5 mg via ORAL
  Filled 2020-03-17: qty 1

## 2020-03-17 MED ORDER — MECLIZINE HCL 12.5 MG PO TABS: 12.5000 mg | ORAL_TABLET | Freq: Two times a day (BID) | ORAL | 0 refills | Status: DC | PRN

## 2020-03-17 NOTE — ED Triage Notes (Signed)
Pt arrives to ED via [facility] with c/o dizziness. {t states dizzy spells over the last 3-4 days. This morning pt experienced dizziness along with nausea and vomiting. Denies abdominal or chest pain. EMS gave 4mg  zofran which helped pts symptoms.

## 2020-03-17 NOTE — ED Notes (Signed)
Patient verbalizes understanding of discharge instructions. Opportunity for questioning and answers were provided. Armband removed by staff, pt discharged from ED.  

## 2020-03-17 NOTE — ED Provider Notes (Signed)
[facility] EMERGENCY DEPARTMENT Provider Note   CSN: [id number] Arrival date & time: 03/17/20  9030     History Chief Complaint  Patient presents with  . Dizziness    [name] is a 86 y.o. female.  Pt presents to the ED today with dizziness.  Pt said she has had a few dizzy spells for the last few days.  She does not have a spinning experience, but feels like the world is not straight.  The pt had some n/v this am.  She called EMS who gave her 4 mg of zofran.  She is feeling better now and feels like she could go home and go back to sleep.  She did take her morning meds and thinks she kept those down.  No f/c.  No abd pain or cp.  No sob.        Past Medical History:  Diagnosis Date  . Anemia due to blood loss, chronic   . Anemia, iron deficiency   . Angiodysplasia of colon 05/09/2005   Single small angiodysplastic lesion in the descending colon found on colonoscopy 05/09/2005 by [name]  . Arthritis    "knots on my thumbs; left elbow" (03/03/2014)  . Cataracts, bilateral   . Chest pain, non-cardiac 05/30/2017  . Chronic ankle pain    bilateral  . Chronic kidney disease, stage IV (severe) ([place])    [name] 02/09/2014  . Chronic knee pain    bilateral  . Chronic thumb pain   . Depression   . Diverticulitis of large intestine without perforation or abscess without bleeding 05/21/2018  . Ear noise 01/07/2013  . Epicondylitis   . Gastritis, Helicobacter pylori    Noted on EGD 01/18/2009 by [name];  biopsy showed chronic gastritis with Helicobacter pylori, treated by [name].  [name] GERD (gastroesophageal reflux disease)   . Gout   . Hemorrhoids, internal 05/09/2005    Found on colonoscopy 05/09/2005 by [name]  . History of blood transfusion    "low blood count"  . Hypertension   . Osteoporosis, unspecified 03/06/2012   A DXA bone density scan done 02/20/2012 showed osteoporosis with a lumbar spine T-score of -4.4 and a right  femur T-score of -2.8.  Patient has chronic kidney disease with estimated GFR less than 30, and it is not clear how much of her osteoporosis may be due to renal osteodystrophy.    . Pericardial effusion 11/2004   S/P subxiphoid pericardial window 11/01/2004 by [name].  A question of possible collagen vascular disease had been raised in 2006 due to arthralgias, a history of idiopathic pericarditis, and increased pigmentation of her palms, and she was referred to [name] but he was unable to confirm a rheumatologic diagnosis.  . Pinguecula   . Renal failure    Formerly on hemodialysis; managed by Dr. Corliss Parish  . Stiffness of joint, not elsewhere classified, hand   . Stroke [facility]) 2007   "when my kidneys went out & I was in a coma; weak LUE/LLE since"  . Subjective tinnitus of both ears 01/24/2018  . Thigh pain   . Vitreous detachment    "no OR"    Patient Active Problem List   Diagnosis Date Noted  . Nonrheumatic tricuspid valve regurgitation 12/09/2019  . Nonrheumatic mitral valve regurgitation 10/30/2019  . Benign hypertensive heart and kidney disease and CKD stage V ([place]) 10/30/2019  . Benign positional vertigo 09/21/2016  . Preventative health care  09/11/2016  . Vaginal mass 07/19/2016  . Insomnia 05/03/2015  . GERD (gastroesophageal reflux disease) 05/03/2015  . Fatigue 04/20/2015  . Meningioma ([place]) 03/01/2015  . Tinnitus 01/24/2015  . History of CVA (cerebrovascular accident) 01/24/2015  . Gout 03/03/2014  . Hearing loss 11/03/2013  . Osteoporosis 03/06/2012  . UTI (urinary tract infection) 03/21/2011  . Depression 08/30/2009  . Iron deficiency anemia 06/01/2009  . Disease of pericardium 06/01/2009  . CKD (chronic kidney disease) stage 4, GFR 15-29 ml/min (HCC) 06/01/2009  . CATARACTS 10/24/2005  . Essential hypertension 10/24/2005  . ANGIODYSPLASIA, COLON 05/09/2005    Past Surgical History:  Procedure Laterality Date  . ABDOMINAL  HYSTERECTOMY  1960's  . ARTERIOVENOUS GRAFT PLACEMENT Left 11/28/2005   forearm arteriovenous  . CARDIAC CATHETERIZATION  01/2005   [name] 05/15/2010  . HALLUX VALGUS CORRECTION Left 01/2012   foot  . SUBXYPHOID PERICARDIAL WINDOW  11/01/2004  . THROMBECTOMY Left  03/06/2006   Simple thrombectomy arteriovenous Gore-Tex graft, left arm, with exploration of venous end and intraoperative shuntogram.         . THROMBECTOMY / ARTERIOVENOUS GRAFT REVISION Left 01/21/2006   Thrombectomy and revision of left forearm loop AV graft.  . TUBAL LIGATION  1960's   "before hysterectomy"     OB History   No obstetric history on file.     Family History  Problem Relation Age of Onset  . Hypertension Brother   . Heart disease Father   . Diabetes Brother   . Breast cancer Neg Hx   . Colon cancer Neg Hx   . Lung cancer Neg Hx   . Cervical cancer Neg Hx   . Sickle cell anemia Neg Hx     Social History   Tobacco Use  . Smoking status: Former Smoker    Packs/day: 0.10    Years: 1.00    Pack years: 0.10    Types: Cigarettes    Quit date: 01/02/1960    Years since quitting: 60.2  . Smokeless tobacco: Never Used  . Tobacco comment: patient stated has never smoked   Vaping Use  . Vaping Use: Never used  Substance Use Topics  . Alcohol use: No    Alcohol/week: 0.0 standard drinks  . Drug use: No    Home Medications Prior to Admission medications   Medication Sig Start Date End Date Taking? Authorizing Provider  cephALEXin (KEFLEX) 500 MG capsule Take 1 capsule (500 mg total) by mouth 4 (four) times daily. 03/17/20  Yes Isla Pence, MD  meclizine (ANTIVERT) 12.5 MG tablet Take 1 tablet (12.5 mg total) by mouth 2 (two) times daily as needed for dizziness. 03/17/20  Yes Isla Pence, MD  ondansetron (ZOFRAN ODT) 4 MG disintegrating tablet Take 1 tablet (4 mg total) by mouth every 8 (eight) hours as needed for nausea or vomiting. 03/17/20  Yes Isla Pence, MD  allopurinol (ZYLOPRIM)  100 MG tablet Take 1.5 tablets (150 mg total) by mouth daily. 03/10/19   [name], MD  amLODipine (NORVASC) 10 MG tablet Take 1 tablet (10 mg total) by mouth at bedtime. 01/13/20   [name], MD  aspirin EC 81 MG tablet Take 81 mg by mouth at bedtime.     [provider]  buPROPion (WELLBUTRIN XL) 300 MG 24 hr tablet TAKE 1 TABLET(300 MG) BY MOUTH DAILY 11/24/19   [name], MD  furosemide (LASIX) 80 MG tablet TAKE 1/2 TABLET BY MOUTH DAILY Patient taking differently: Take 80 mg by mouth daily.  10/14/19   [name], MD  labetalol (NORMODYNE) 200 MG tablet Take 1 tablet (200 mg total) by mouth 2 (two) times daily. 12/09/19   Werner Lean, MD  Multiple Vitamins-Minerals (MULTIVITAMIN WITH MINERALS) tablet Take 1 tablet by mouth daily.     [provider]  omeprazole (PRILOSEC) 20 MG capsule TAKE 1 CAPSULE(20 MG) BY MOUTH TWICE DAILY 12/15/19   [name], MD  SENSIPAR 30 MG tablet TAKE 1 TABLET BY MOUTH ONCE DAILY Patient taking differently: Take 30 mg by mouth daily with breakfast.  10/02/16   [name], MD  Vitamins-Lipotropics (LIPOFLAVONOID) TABS Take 1 tablet by mouth 3 (three) times daily as needed (tinnitus). 6/33/35   Delora Fuel, MD  FLUoxetine (PROZAC) 10 MG tablet Take 1 tablet (10 mg total) by mouth daily. 03/07/11 06/02/18  [name], MD    Allergies    Ibuprofen  Review of Systems   Review of Systems  Gastrointestinal: Positive for nausea and vomiting.  Neurological: Positive for dizziness.  All other systems reviewed and are negative.   Physical Exam Updated Vital Signs BP 129/68 (BP Location: Right Arm)   Pulse 72   Temp 98.3 F (36.8 C) (Oral)   Resp (!) 22   Ht 5\' 2"  (1.575 m)   Wt 65.3 kg   SpO2 100%   BMI 26.33 kg/m   Physical Exam Vitals and nursing note reviewed.  Constitutional:      Appearance: Normal appearance.  HENT:     Head: Normocephalic and atraumatic.     Right Ear:  External ear normal.     Left Ear: External ear normal.     Nose: Nose normal.     Mouth/Throat:     Mouth: Mucous membranes are moist.     Pharynx: Oropharynx is clear.  Eyes:     Extraocular Movements: Extraocular movements intact.     Conjunctiva/sclera: Conjunctivae normal.     Pupils: Pupils are equal, round, and reactive to light.  Cardiovascular:     Rate and Rhythm: Normal rate and regular rhythm.     Pulses: Normal pulses.     Heart sounds: Normal heart sounds.  Pulmonary:     Effort: Pulmonary effort is normal.     Breath sounds: Normal breath sounds.  Abdominal:     General: Abdomen is flat. Bowel sounds are normal.     Palpations: Abdomen is soft.  Musculoskeletal:        General: Normal range of motion.     Cervical back: Normal range of motion and neck supple.  Skin:    General: Skin is warm.     Capillary Refill: Capillary refill takes less than 2 seconds.  Neurological:     General: No focal deficit present.     Mental Status: She is alert and oriented to person, place, and time.  Psychiatric:        Mood and Affect: Mood normal.        Behavior: Behavior normal.     ED Results / Procedures / Treatments   Labs (all labs ordered are listed, but only abnormal results are displayed) Labs Reviewed  CBC WITH DIFFERENTIAL/PLATELET - Abnormal; Notable for the following components:      Result Value   RBC 3.47 (*)    Hemoglobin 11.0 (*)    HCT 35.1 (*)    MCV 101.2 (*)    All other components within normal limits  COMPREHENSIVE METABOLIC PANEL - Abnormal; Notable for the following components:  BUN 43 (*)    Creatinine, Ser 2.89 (*)    Alkaline Phosphatase 130 (*)    GFR, Estimated 16 (*)    All other components within normal limits  URINALYSIS, ROUTINE W REFLEX MICROSCOPIC - Abnormal; Notable for the following components:   Hgb urine dipstick SMALL (*)    Leukocytes,Ua LARGE (*)    Bacteria, UA RARE (*)    All other components within normal limits   TROPONIN I (HIGH SENSITIVITY) - Abnormal; Notable for the following components:   Troponin I (High Sensitivity) 28 (*)    All other components within normal limits  URINE CULTURE  LIPASE, BLOOD  TROPONIN I (HIGH SENSITIVITY)    EKG EKG Interpretation  Date/Time:  Thursday March 17 2020 08:39:30 EDT Ventricular Rate:  58 PR Interval:    QRS Duration: 108 QT Interval:  431 QTC Calculation: 424 R Axis:   5 Text Interpretation: Sinus rhythm No significant change since last tracing Confirmed by Isla Pence [phone]) on 03/17/2020 8:44:52 AM   Radiology No results found.  Procedures Procedures   Medications Ordered in ED Medications  sodium chloride 0.9 % bolus 500 mL (0 mLs Intravenous Stopped 03/17/20 1037)  meclizine (ANTIVERT) tablet 12.5 mg (12.5 mg Oral Given 03/17/20 7867)    ED Course  I have reviewed the triage vital signs and the nursing notes.  Pertinent labs & imaging results that were available during my care of the patient were reviewed by me and considered in my medical decision making (see chart for details).    MDM Rules/Calculators/A&P                          Pt continues to feel well.  Sx c/w vertigo.  She is given instructions on how to do the Epley maneuver at home.  Her granddaughter is with her now.  She helps care for patient.  Pt does have large leukocytes in her urine, so I will put her on keflex.  Urine sent for culture.  Cr is elevated, but it is around her baseline.  Pt instructed to return if worse.  F/u with pcp.  Final Clinical Impression(s) / ED Diagnoses Final diagnoses:  Vertigo  Acute cystitis without hematuria  CKD (chronic kidney disease) stage 4, GFR 15-29 ml/min (HCC)    Rx / DC Orders ED Discharge Orders         Ordered    cephALEXin (KEFLEX) 500 MG capsule  4 times daily        03/17/20 1052    meclizine (ANTIVERT) 12.5 MG tablet  2 times daily PRN        03/17/20 1052    ondansetron (ZOFRAN ODT) 4 MG disintegrating  tablet  Every 8 hours PRN        03/17/20 1052           Isla Pence, MD 03/17/20 1055

## 2020-03-18 LAB — URINE CULTURE: Culture: <10000 — AB

## 2020-04-05 ENCOUNTER — Other Ambulatory Visit: Payer: Self-pay

## 2020-04-05 ENCOUNTER — Encounter: Payer: Self-pay | Admitting: Internal Medicine

## 2020-04-05 ENCOUNTER — Ambulatory Visit (INDEPENDENT_AMBULATORY_CARE_PROVIDER_SITE_OTHER): Payer: 59 | Admitting: Internal Medicine

## 2020-04-05 VITALS — BP 138/60 | HR 58 | Ht 62.0 in | Wt 141.0 lb

## 2020-04-05 DIAGNOSIS — I340 Nonrheumatic mitral (valve) insufficiency: Secondary | ICD-10-CM

## 2020-04-05 DIAGNOSIS — I361 Nonrheumatic tricuspid (valve) insufficiency: Secondary | ICD-10-CM

## 2020-04-05 DIAGNOSIS — I1311 Hypertensive heart and chronic kidney disease without heart failure, with stage 5 chronic kidney disease, or end stage renal disease: Secondary | ICD-10-CM | POA: Diagnosis not present

## 2020-04-05 DIAGNOSIS — I10 Essential (primary) hypertension: Secondary | ICD-10-CM | POA: Diagnosis not present

## 2020-04-05 DIAGNOSIS — N185 Chronic kidney disease, stage 5: Secondary | ICD-10-CM

## 2020-04-05 NOTE — Progress Notes (Signed)
Cardiology Office Note:    Date:  04/05/2020   ID:  [name], DOB 03-11-1934, MRN [id number]  PCP:  [name], MD  [facility] Cardiologist:  Werner Lean, MD   CC:  Follow up HTN  History of Present Illness:    [name] is a 86 y.o. female with a HTN, Mild mitral regurgitation, CKD Stage V not on HD, prior CVA 2017, question of collagen vascular disease not confirmed by rheumatology, who presented for follow up for HTN 10/30/19.  Started on Labetalol and with ambulatory blood pressure monitoring. Last see 12/09/19, In interim of this visit, patient conservative management planned for WMA noted.  Patient notes that she is doing good.  Since last visit notes  Occasional fatigue and tiredness.  Relevant interval testing or therapy include .  Patient had interval ED visit with UTI and vertigo 03/04/20.  The day prior felt nausea and vertiginous home heath gave Zofran.    No chest pain or pressure.  No SOB/DOE and no PND/Orthopnea.  No weight gain or leg swelling.  No palpitations or syncope.  Most active routing is doing housework and helping church members going to the store.  Ambulatory blood pressure 135/80.   Past Medical History:  Diagnosis Date  . Anemia due to blood loss, chronic   . Anemia, iron deficiency   . Angiodysplasia of colon 05/09/2005   Single small angiodysplastic lesion in the descending colon found on colonoscopy 05/09/2005 by [name]  . Arthritis    "knots on my thumbs; left elbow" (03/03/2014)  . Cataracts, bilateral   . Chest pain, non-cardiac 05/30/2017  . Chronic ankle pain    bilateral  . Chronic kidney disease, stage IV (severe) ([place])    [name] 02/09/2014  . Chronic knee pain    bilateral  . Chronic thumb pain   . Depression   . Diverticulitis of large intestine without perforation or abscess without bleeding 05/21/2018  . Ear noise 01/07/2013  . Epicondylitis   . Gastritis, Helicobacter pylori    Noted on EGD 01/18/2009  by [name];  biopsy showed chronic gastritis with Helicobacter pylori, treated by [name].  [name] GERD (gastroesophageal reflux disease)   . Gout   . Hemorrhoids, internal 05/09/2005    Found on colonoscopy 05/09/2005 by [name]  . History of blood transfusion    "low blood count"  . Hypertension   . Osteoporosis, unspecified 03/06/2012   A DXA bone density scan done 02/20/2012 showed osteoporosis with a lumbar spine T-score of -4.4 and a right femur T-score of -2.8.  Patient has chronic kidney disease with estimated GFR less than 30, and it is not clear how much of her osteoporosis may be due to renal osteodystrophy.    . Pericardial effusion 11/2004   S/P subxiphoid pericardial window 11/01/2004 by [name].  A question of possible collagen vascular disease had been raised in 2006 due to arthralgias, a history of idiopathic pericarditis, and increased pigmentation of her palms, and she was referred to [name] but he was unable to confirm a rheumatologic diagnosis.  . Pinguecula   . Renal failure    Formerly on hemodialysis; managed by Dr. Corliss Parish  . Stiffness of joint, not elsewhere classified, hand   . Stroke [facility]) 2007   "when my kidneys went out & I was in a coma; weak LUE/LLE since"  . Subjective tinnitus of both ears 01/24/2018  . Thigh pain   .  Vitreous detachment    "no OR"    Past Surgical History:  Procedure Laterality Date  . ABDOMINAL HYSTERECTOMY  1960's  . ARTERIOVENOUS GRAFT PLACEMENT Left 11/28/2005   forearm arteriovenous  . CARDIAC CATHETERIZATION  01/2005   [name] 05/15/2010  . HALLUX VALGUS CORRECTION Left 01/2012   foot  . SUBXYPHOID PERICARDIAL WINDOW  11/01/2004  . THROMBECTOMY Left  03/06/2006   Simple thrombectomy arteriovenous Gore-Tex graft, left arm, with exploration of venous end and intraoperative shuntogram.         . THROMBECTOMY / ARTERIOVENOUS GRAFT REVISION Left 01/21/2006   Thrombectomy and revision  of left forearm loop AV graft.  . TUBAL LIGATION  1960's   "before hysterectomy"   Current Medications: Current Meds  Medication Sig  . amLODipine (NORVASC) 10 MG tablet Take 1 tablet (10 mg total) by mouth at bedtime.  [name] aspirin EC 81 MG tablet Take 81 mg by mouth at bedtime.   [name] buPROPion (WELLBUTRIN XL) 300 MG 24 hr tablet TAKE 1 TABLET(300 MG) BY MOUTH DAILY  . cephALEXin (KEFLEX) 500 MG capsule Take 1 capsule (500 mg total) by mouth 4 (four) times daily.  . furosemide (LASIX) 80 MG tablet TAKE 1/2 TABLET BY MOUTH DAILY (Patient taking differently: Take 80 mg by mouth daily.)  . labetalol (NORMODYNE) 200 MG tablet Take 1 tablet (200 mg total) by mouth 2 (two) times daily.  . meclizine (ANTIVERT) 12.5 MG tablet Take 1 tablet (12.5 mg total) by mouth 2 (two) times daily as needed for dizziness.  . Multiple Vitamins-Minerals (MULTIVITAMIN WITH MINERALS) tablet Take 1 tablet by mouth daily.   [name] omeprazole (PRILOSEC) 20 MG capsule TAKE 1 CAPSULE(20 MG) BY MOUTH TWICE DAILY  . ondansetron (ZOFRAN ODT) 4 MG disintegrating tablet Take 1 tablet (4 mg total) by mouth every 8 (eight) hours as needed for nausea or vomiting.  [name] PREMARIN vaginal cream as needed.  . SENSIPAR 30 MG tablet TAKE 1 TABLET BY MOUTH ONCE DAILY    Allergies:   Ibuprofen   Social History   Socioeconomic History  . Marital status: Legally Separated    Spouse name: Not on file  . Number of children: Not on file  . Years of education: Not on file  . Highest education level: Not on file  Occupational History  . Not on file  Tobacco Use  . Smoking status: Former Smoker    Packs/day: 0.10    Years: 1.00    Pack years: 0.10    Types: Cigarettes    Quit date: 01/02/1960    Years since quitting: 60.2  . Smokeless tobacco: Never Used  . Tobacco comment: patient stated has never smoked   Vaping Use  . Vaping Use: Never used  Substance and Sexual Activity  . Alcohol use: No    Alcohol/week: 0.0 standard drinks  . Drug  use: No  . Sexual activity: Not on file  Other Topics Concern  . Not on file  Social History Narrative  . Not on file   Social Determinants of Health   Financial Resource Strain: Not on file  Food Insecurity: Not on file  Transportation Needs: Not on file  Physical Activity: Not on file  Stress: Not on file  Social Connections: Not on file    Family History: The patient's family history includes Diabetes in her brother; Heart disease in her father; Hypertension in her brother. There is no history of Breast cancer, Colon cancer, Lung cancer, Cervical cancer, or Sickle cell anemia.  All but two members of her family are decreased.  Has eight children.  ROS:   Please see the history of present illness.    All other systems reviewed and are negative.  EKGs/Labs/Other Studies Reviewed:    The following studies were reviewed today:  EKG:  11/05/19 Sinus Rhythm Rate 74 with LAE 07/10/19 Likely sinus rhythm (upright p wave morphology V1) rate 68   Transthoracic Echocardiogram: Date:04/05/20 Results: HTN through study, mild to moderate TR, mild MR 1. Left ventricular ejection fraction, by estimation, is 60 to 65%. The  left ventricle has normal function. The left ventricle demonstrates  regional wall motion abnormalities (see scoring diagram/findings for  description). There is mild left ventricular  hypertrophy of the basal-septal segment. Left ventricular diastolic  parameters are consistent with Grade I diastolic dysfunction (impaired  relaxation). There is moderate hypokinesis of the left ventricular, basal  inferior wall.  2. Right ventricular systolic function is normal. The right ventricular  size is normal. There is mildly elevated pulmonary artery systolic  pressure. The estimated right ventricular systolic pressure is 76.2 mmHg.  3. Left atrial size was moderately dilated.  4. The mitral valve is abnormal. Mild mitral valve regurgitation.  5. Tricuspid valve  regurgitation is mild to moderate.  6. The aortic valve is tricuspid. Aortic valve regurgitation is trivial.  7. The inferior vena cava is normal in size with greater than 50%  respiratory variability, suggesting right atrial pressure of 3 mmHg.  8. Aneurysmal IAS. Cannot exclude PFO.    Recent Labs: 03/17/2020: ALT 19; BUN 43; Creatinine, Ser 2.89; Hemoglobin 11.0; Platelets 160; Potassium 4.8; Sodium 139  Recent Lipid Panel    Component Value Date/Time   CHOL 203 (H) 05/24/2018 1435   CHOL 184 01/24/2015 1410   TRIG 84 05/24/2018 1435   HDL 81 05/24/2018 1435   HDL 85 01/24/2015 1410   CHOLHDL 2.5 05/24/2018 1435   VLDL 17 05/24/2018 1435   LDLCALC 105 (H) 05/24/2018 1435   LDLCALC 77 01/24/2015 1410    Physical Exam:    VS:  BP 138/60   Pulse (!) 58   Ht 5\' 2"  (1.575 m)   Wt 141 lb (64 kg)   SpO2 98%   BMI 25.79 kg/m     Wt Readings from Last 3 Encounters:  04/05/20 141 lb (64 kg)  03/17/20 143 lb 15.4 oz (65.3 kg)  12/09/19 144 lb (65.3 kg)    GEN: Well nourished, well developed in no acute distress HEENT: Normal NECK: No JVD; No carotid bruits, has small scar from prior left sided IJ LYMPHATICS: No lymphadenopathy CARDIAC: RRR, soft holosystolic murmur best heard at RUSB rubs, gallops; RESPIRATORY:  Clear to auscultation without rales, wheezing or rhonchi  ABDOMEN: Soft, non-tender, non-distended MUSCULOSKELETAL:  No edema; No deformity  SKIN: Warm and dry NEUROLOGIC:  Alert and oriented x 3 PSYCHIATRIC:  Normal affect   ASSESSMENT:    1. Essential hypertension   2. Benign hypertensive heart and kidney disease and CKD stage V ([place])   3. Nonrheumatic mitral valve regurgitation   4. Nonrheumatic tricuspid valve regurgitation    PLAN:    In order of problems listed above:  Essential Hypertension CKD Stage V - ambulatory blood pressure not done, will start ambulatory BP monitoring; gave education on how to perform ambulatory blood pressure monitoring  including the frequency and technique; goal ambulatory blood pressure < 135/85 on average - continue home medications: labetalol 200 mg BID, norvasc 10 mg  - discussed  medication with family   Mitral Regurgitation- Mild Tricuspid Regurgitation- Mild to Moderate Distant pericardial window for presumed inflammatory pericardial effusion 2006 [place]) -Continue 40 mg PO Lasix Daily Diuresis  WMA seen 11/2019 Prior TIA NOS - Discussed again  risks and benefits of LHC for evaluation of inferobasal WMA in the setting or CKD; discussed with granddaughter and patient; will defe - continue ASA  Six months follow up unless new symptoms or abnormal test results warranting change in plan  Would be reasonable for  Video Visit Follow up Would be reasonable for  APP Follow up  Medication Adjustments/Labs and Tests Ordered: Current medicines are reviewed at length with the patient today.  Concerns regarding medicines are outlined above.  No orders of the defined types were placed in this encounter.  No orders of the defined types were placed in this encounter.   Patient Instructions  Medication Instructions:  Your physician recommends that you continue on your current medications as directed. Please refer to the Current Medication list given to you today.  *If you need a refill on your cardiac medications before your next appointment, please call your pharmacy*   Lab Work: NONE If you have labs (blood work) drawn today and your tests are completely normal, you will receive your results only by: [name] MyChart Message (if you have MyChart) OR . A paper copy in the mail If you have any lab test that is abnormal or we need to change your treatment, we will call you to review the results.   Testing/Procedures: NONE   Follow-Up: At [facility], you and your health needs are our priority.  As part of our continuing mission to provide you with exceptional heart care, we have created  designated Provider Care Teams.  These Care Teams include your primary Cardiologist (physician) and Advanced Practice Providers (APPs -  Physician Assistants and Nurse Practitioners) who all work together to provide you with the care you need, when you need it.  We recommend signing up for the patient portal called "MyChart".  Sign up information is provided on this After Visit Summary.  MyChart is used to connect with patients for Virtual Visits (Telemedicine).  Patients are able to view lab/test results, encounter notes, upcoming appointments, etc.  Non-urgent messages can be sent to your provider as well.   To learn more about what you can do with MyChart, go to [email or web address].    Your next appointment:   6 month(s)  The format for your next appointment:   In Person  Provider:   You may see Werner Lean, MD or one of the following Advanced Practice Providers on your designated Care Team:    Melina Copa, PA-C  Ermalinda Barrios, PA-C         Signed, Werner Lean, MD  04/05/2020 9:56 AM    [place]

## 2020-04-05 NOTE — Patient Instructions (Signed)
Medication Instructions:  Your physician recommends that you continue on your current medications as directed. Please refer to the Current Medication list given to you today.  *If you need a refill on your cardiac medications before your next appointment, please call your pharmacy*   Lab Work: NONE If you have labs (blood work) drawn today and your tests are completely normal, you will receive your results only by: . MyChart Message (if you have MyChart) OR . A paper copy in the mail If you have any lab test that is abnormal or we need to change your treatment, we will call you to review the results.   Testing/Procedures: NONE   Follow-Up: At [facility], you and your health needs are our priority.  As part of our continuing mission to provide you with exceptional heart care, we have created designated Provider Care Teams.  These Care Teams include your primary Cardiologist (physician) and Advanced Practice Providers (APPs -  Physician Assistants and Nurse Practitioners) who all work together to provide you with the care you need, when you need it.  We recommend signing up for the patient portal called "MyChart".  Sign up information is provided on this After Visit Summary.  MyChart is used to connect with patients for Virtual Visits (Telemedicine).  Patients are able to view lab/test results, encounter notes, upcoming appointments, etc.  Non-urgent messages can be sent to your provider as well.   To learn more about what you can do with MyChart, go to [email or web address].    Your next appointment:   6 month(s)  The format for your next appointment:   In Person  Provider:   You may see Mahesh A Chandrasekhar, MD or one of the following Advanced Practice Providers on your designated Care Team:    Dayna Dunn, PA-C  Michele Lenze, PA-C       

## 2020-04-24 ENCOUNTER — Other Ambulatory Visit: Payer: Self-pay | Admitting: Internal Medicine

## 2020-04-24 DIAGNOSIS — K219 Gastro-esophageal reflux disease without esophagitis: Secondary | ICD-10-CM

## 2020-05-10 ENCOUNTER — Ambulatory Visit
Admission: RE | Admit: 2020-05-10 | Discharge: 2020-05-10 | Disposition: A | Discharge status: Home / Self Care | Payer: Medicare Other | Source: Ambulatory Visit | Attending: Physician Assistant | Admitting: Physician Assistant

## 2020-05-10 ENCOUNTER — Other Ambulatory Visit: Payer: Self-pay

## 2020-05-10 DIAGNOSIS — M810 Age-related osteoporosis without current pathological fracture: Secondary | ICD-10-CM

## 2020-05-25 ENCOUNTER — Other Ambulatory Visit: Payer: Self-pay | Admitting: Internal Medicine

## 2020-05-25 DIAGNOSIS — F32A Depression, unspecified: Secondary | ICD-10-CM

## 2020-07-10 ENCOUNTER — Other Ambulatory Visit: Payer: Self-pay | Admitting: Internal Medicine

## 2020-07-10 DIAGNOSIS — I10 Essential (primary) hypertension: Secondary | ICD-10-CM

## 2020-08-05 ENCOUNTER — Other Ambulatory Visit: Payer: Self-pay | Admitting: Internal Medicine

## 2020-08-05 DIAGNOSIS — K219 Gastro-esophageal reflux disease without esophagitis: Secondary | ICD-10-CM

## 2020-10-08 ENCOUNTER — Other Ambulatory Visit: Payer: Self-pay | Admitting: Internal Medicine

## 2020-10-08 DIAGNOSIS — I10 Essential (primary) hypertension: Secondary | ICD-10-CM

## 2020-12-01 ENCOUNTER — Other Ambulatory Visit: Payer: Self-pay | Admitting: Internal Medicine

## 2020-12-01 DIAGNOSIS — F32A Depression, unspecified: Secondary | ICD-10-CM

## 2021-01-03 ENCOUNTER — Other Ambulatory Visit: Payer: Self-pay | Admitting: Student

## 2021-01-03 DIAGNOSIS — Z1231 Encounter for screening mammogram for malignant neoplasm of breast: Secondary | ICD-10-CM

## 2021-01-06 ENCOUNTER — Other Ambulatory Visit: Payer: Self-pay | Admitting: Internal Medicine

## 2021-01-06 DIAGNOSIS — N184 Chronic kidney disease, stage 4 (severe): Secondary | ICD-10-CM

## 2021-01-06 DIAGNOSIS — I10 Essential (primary) hypertension: Secondary | ICD-10-CM

## 2021-01-08 ENCOUNTER — Encounter (HOSPITAL_COMMUNITY): Payer: Self-pay | Admitting: *Deleted

## 2021-01-08 ENCOUNTER — Other Ambulatory Visit: Payer: Self-pay

## 2021-01-08 ENCOUNTER — Emergency Department (HOSPITAL_COMMUNITY)
Admission: EM | Admit: 2021-01-08 | Discharge: 2021-01-08 | Disposition: A | Discharge status: Home / Self Care | Payer: 59 | Attending: Emergency Medicine | Admitting: Emergency Medicine

## 2021-01-08 DIAGNOSIS — Z7982 Long term (current) use of aspirin: Secondary | ICD-10-CM | POA: Diagnosis not present

## 2021-01-08 DIAGNOSIS — R0602 Shortness of breath: Secondary | ICD-10-CM | POA: Diagnosis not present

## 2021-01-08 DIAGNOSIS — I10 Essential (primary) hypertension: Secondary | ICD-10-CM | POA: Diagnosis not present

## 2021-01-08 DIAGNOSIS — N814 Uterovaginal prolapse, unspecified: Secondary | ICD-10-CM | POA: Diagnosis not present

## 2021-01-08 DIAGNOSIS — Z79899 Other long term (current) drug therapy: Secondary | ICD-10-CM | POA: Insufficient documentation

## 2021-01-08 DIAGNOSIS — N8110 Cystocele, unspecified: Secondary | ICD-10-CM

## 2021-01-08 LAB — BRAIN NATRIURETIC PEPTIDE: B Natriuretic Peptide: 576.2 pg/mL — ABNORMAL HIGH (ref 0.0–100.0)

## 2021-01-08 LAB — CBC WITH DIFFERENTIAL/PLATELET
Abs Immature Granulocytes: 0.02 10*3/uL (ref 0.00–0.07)
Basophils Absolute: 0 10*3/uL (ref 0.0–0.1)
Basophils Relative: 0 %
Eosinophils Absolute: 0.2 10*3/uL (ref 0.0–0.5)
Eosinophils Relative: 4 %
HCT: 33.7 % — ABNORMAL LOW (ref 36.0–46.0)
Hemoglobin: 11 g/dL — ABNORMAL LOW (ref 12.0–15.0)
Immature Granulocytes: 0 %
Lymphocytes Relative: 24 %
Lymphs Abs: 1.3 10*3/uL (ref 0.7–4.0)
MCH: 31.8 pg (ref 26.0–34.0)
MCHC: 32.6 g/dL (ref 30.0–36.0)
MCV: 97.4 fL (ref 80.0–100.0)
Monocytes Absolute: 0.4 10*3/uL (ref 0.1–1.0)
Monocytes Relative: 8 %
Neutro Abs: 3.3 10*3/uL (ref 1.7–7.7)
Neutrophils Relative %: 64 %
Platelets: 169 10*3/uL (ref 150–400)
RBC: 3.46 MIL/uL — ABNORMAL LOW (ref 3.87–5.11)
RDW: 14.2 % (ref 11.5–15.5)
WBC: 5.3 10*3/uL (ref 4.0–10.5)
nRBC: 0 % (ref 0.0–0.2)

## 2021-01-08 NOTE — ED Provider Notes (Signed)
[place] EMERGENCY DEPARTMENT Provider Note   CSN: [id number] Arrival date & time: 01/08/21  1452     History  Chief Complaint  Patient presents with   Vaginal Prolapse    [name] is a 87 y.o. female.  HPI Patient presents with her granddaughter.  Patient is generally well, was at home, when she felt onset of sudden pain protrusion of vaginal tissue after urinating.  She notes that over the past 2 weeks this has become a new phenomenon, occurring once or twice a day, typically similar to what she experienced just prior to ED arrival.  With either urination or defecation and straining patient has vaginal prolapse.  This typically resolves after few moments, but today did not do so as soon as it typically has previously.  She alerted her granddaughter, who notes that on her investigation the patient had an amount of tissue roughly the size of a golf ball protruding from her vagina.  They called 911 for evaluation.    Home Medications Prior to Admission medications   Medication Sig Start Date End Date Taking? Authorizing Provider  amLODipine (NORVASC) 10 MG tablet TAKE 1 TABLET(10 MG) BY MOUTH AT BEDTIME 01/06/21   Axel Filler, MD  aspirin EC 81 MG tablet Take 81 mg by mouth at bedtime.     [provider]  buPROPion (WELLBUTRIN XL) 300 MG 24 hr tablet TAKE 1 TABLET(300 MG) BY MOUTH DAILY 12/05/20   [name], MD  cephALEXin (KEFLEX) 500 MG capsule Take 1 capsule (500 mg total) by mouth 4 (four) times daily. 03/17/20   Isla Pence, MD  furosemide (LASIX) 80 MG tablet TAKE 1/2 TABLET BY MOUTH DAILY Patient taking differently: Take 80 mg by mouth daily. 10/14/19   [name], MD  labetalol (NORMODYNE) 200 MG tablet Take 1 tablet (200 mg total) by mouth 2 (two) times daily. 12/09/19   Werner Lean, MD  meclizine (ANTIVERT) 12.5 MG tablet Take 1 tablet (12.5 mg total) by mouth 2 (two) times daily as needed for dizziness.  03/17/20   Isla Pence, MD  Multiple Vitamins-Minerals (MULTIVITAMIN WITH MINERALS) tablet Take 1 tablet by mouth daily.     [provider]  omeprazole (PRILOSEC) 20 MG capsule TAKE 1 CAPSULE(20 MG) BY MOUTH TWICE DAILY 08/05/20   Joni Reining C, DO  ondansetron (ZOFRAN ODT) 4 MG disintegrating tablet Take 1 tablet (4 mg total) by mouth every 8 (eight) hours as needed for nausea or vomiting. 03/17/20   Isla Pence, MD  PREMARIN vaginal cream as needed. 01/13/20   [provider]  SENSIPAR 30 MG tablet TAKE 1 TABLET BY MOUTH ONCE DAILY 10/02/16   [name], MD      Allergies    Ibuprofen    Review of Systems   Review of Systems  Constitutional:        Per HPI, otherwise negative  HENT:         Per HPI, otherwise negative  Respiratory:         Per HPI, otherwise negative  Cardiovascular:        Per HPI, otherwise negative  Gastrointestinal:  Negative for vomiting.  Endocrine:       Negative aside from HPI  Genitourinary:        Neg aside from HPI   Musculoskeletal:        Per HPI, otherwise negative  Skin: Negative.   Neurological:  Negative for syncope.   Physical Exam Updated Vital  Signs BP (!) 161/77    Pulse 74    Temp 98.5 F (36.9 C) (Oral)    Resp 17    Ht 5\' 2"  (1.575 m)    Wt 64 kg    SpO2 99%    BMI 25.81 kg/m  Physical Exam Vitals and nursing note reviewed.  Constitutional:      General: She is not in acute distress.    Appearance: She is well-developed.  HENT:     Head: Normocephalic and atraumatic.  Eyes:     Conjunctiva/sclera: Conjunctivae normal.  Cardiovascular:     Rate and Rhythm: Normal rate and regular rhythm.  Pulmonary:     Effort: Pulmonary effort is normal. No respiratory distress.     Breath sounds: Normal breath sounds. No stridor.  Abdominal:     General: There is no distension.     Tenderness: There is no abdominal tenderness. There is no guarding.  Genitourinary:    Comments: According to nurse who did  triage, prior to my evaluation, on arrival the patient had visible vaginal tissue protruding, this has resolved. Skin:    General: Skin is warm and dry.  Neurological:     Mental Status: She is alert and oriented to person, place, and time.     Cranial Nerves: No cranial nerve deficit.     Comments: Poor hearing otherwise unremarkable    ED Results / Procedures / Treatments   Labs (all labs ordered are listed, but only abnormal results are displayed) Labs Reviewed  CBC WITH DIFFERENTIAL/PLATELET - Abnormal; Notable for the following components:      Result Value   RBC 3.46 (*)    Hemoglobin 11.0 (*)    HCT 33.7 (*)    All other components within normal limits  BRAIN NATRIURETIC PEPTIDE    EKG None  Radiology No results found.  Procedures Procedures    Medications Ordered in ED Medications - No data to display  ED Course/ Medical Decision Making/ A&P Initial evaluation I discussed patient's case with her gynecologist, Dr. Benjie Karvonen.                         Medical Decision Making L female presents with new vaginal prolapse, which is resolved prior to my evaluation.  She is otherwise awake, alert, hemodynamically unremarkable, I reviewed her labs, interpretation reassuring, normal CBC, no evidence for other acute findings.  Patient's symptoms have resolved here, but given concern for intermittent vaginal prolapse I discussed her case with her gynecologist.  No early evidence for concurrent infection, no peritonitis, no other complaints.  Chart review notable for generally reassuring medical issues for 87 year old female, aside from vertigo, hypertension.  Patient appropriate for discharge with follow-up tomorrow which I arranged with her physician.       Final Clinical Impression(s) / ED Diagnoses Final diagnoses:  Vaginal prolapse     Carmin Muskrat, MD 01/08/21 1926

## 2021-01-08 NOTE — ED Triage Notes (Signed)
Pt states for 1 week she has been experiencing a vaginal prolapse, intermittently, lasting 30 min.  Today experienced the same today:  she showed her family member and she called 89.  P

## 2021-01-08 NOTE — Discharge Instructions (Addendum)
As discussed, your obstetrician is expecting to follow-up with you in the next day, possibly 2.  If you do not receive a call tomorrow morning, please call for follow-up.  Return here for concerning changes in your condition.

## 2021-01-09 ENCOUNTER — Other Ambulatory Visit: Payer: Self-pay

## 2021-01-09 DIAGNOSIS — N184 Chronic kidney disease, stage 4 (severe): Secondary | ICD-10-CM

## 2021-01-09 MED ORDER — FUROSEMIDE 80 MG PO TABS: 40.0000 mg | ORAL_TABLET | Freq: Every day | ORAL | 0 refills | Status: DC

## 2021-01-27 ENCOUNTER — Ambulatory Visit: Payer: 59

## 2021-01-30 ENCOUNTER — Other Ambulatory Visit: Payer: Self-pay

## 2021-01-30 DIAGNOSIS — K219 Gastro-esophageal reflux disease without esophagitis: Secondary | ICD-10-CM

## 2021-01-30 MED ORDER — OMEPRAZOLE 20 MG PO CPDR: DELAYED_RELEASE_CAPSULE | ORAL | 1 refills | Status: DC

## 2021-02-01 ENCOUNTER — Ambulatory Visit
Admission: RE | Admit: 2021-02-01 | Discharge: 2021-02-01 | Disposition: A | Discharge status: Home / Self Care | Payer: 59 | Source: Ambulatory Visit | Attending: Student | Admitting: Student

## 2021-02-01 DIAGNOSIS — Z1231 Encounter for screening mammogram for malignant neoplasm of breast: Secondary | ICD-10-CM

## 2021-02-28 ENCOUNTER — Encounter: Payer: 59 | Admitting: Internal Medicine

## 2021-03-22 ENCOUNTER — Encounter: Payer: Self-pay | Admitting: Internal Medicine

## 2021-03-22 ENCOUNTER — Ambulatory Visit (INDEPENDENT_AMBULATORY_CARE_PROVIDER_SITE_OTHER): Payer: 59 | Admitting: Internal Medicine

## 2021-03-22 VITALS — BP 124/64 | HR 57 | Temp 97.8°F | Ht 62.0 in | Wt 129.1 lb

## 2021-03-22 DIAGNOSIS — M1A30X0 Chronic gout due to renal impairment, unspecified site, without tophus (tophi): Secondary | ICD-10-CM

## 2021-03-22 DIAGNOSIS — Z23 Encounter for immunization: Secondary | ICD-10-CM | POA: Diagnosis not present

## 2021-03-22 DIAGNOSIS — I10 Essential (primary) hypertension: Secondary | ICD-10-CM

## 2021-03-22 DIAGNOSIS — D329 Benign neoplasm of meninges, unspecified: Secondary | ICD-10-CM

## 2021-03-22 DIAGNOSIS — F32A Depression, unspecified: Secondary | ICD-10-CM

## 2021-03-22 DIAGNOSIS — N184 Chronic kidney disease, stage 4 (severe): Secondary | ICD-10-CM

## 2021-03-22 DIAGNOSIS — N8110 Cystocele, unspecified: Secondary | ICD-10-CM

## 2021-03-22 DIAGNOSIS — Z0000 Encounter for general adult medical examination without abnormal findings: Secondary | ICD-10-CM

## 2021-03-22 DIAGNOSIS — N898 Other specified noninflammatory disorders of vagina: Secondary | ICD-10-CM

## 2021-03-22 DIAGNOSIS — D509 Iron deficiency anemia, unspecified: Secondary | ICD-10-CM

## 2021-03-22 DIAGNOSIS — I129 Hypertensive chronic kidney disease with stage 1 through stage 4 chronic kidney disease, or unspecified chronic kidney disease: Secondary | ICD-10-CM | POA: Diagnosis not present

## 2021-03-22 DIAGNOSIS — K219 Gastro-esophageal reflux disease without esophagitis: Secondary | ICD-10-CM | POA: Diagnosis not present

## 2021-03-22 MED ORDER — ALLOPURINOL 100 MG PO TABS: 200.0000 mg | ORAL_TABLET | Freq: Every day | ORAL | 2 refills | Status: AC

## 2021-03-22 MED ORDER — FAMOTIDINE 20 MG PO TABS: 20.0000 mg | ORAL_TABLET | Freq: Two times a day (BID) | ORAL | 1 refills | Status: DC

## 2021-03-22 NOTE — Patient Instructions (Signed)
-  Was a pleasure seeing you again today ?-I have stopped your pantoprazole and started you on Pepcid.  I have put refill request into your pharmacy ?-We will check some blood work on you today including your kidney function, blood count and uric acid level ?-We will give you a pneumonia shot today ?-We will give you a tetanus booster at your next visit ?-Please call me here any questions or concerns or if you need any refills ?

## 2021-03-23 ENCOUNTER — Telehealth: Payer: Self-pay | Admitting: Internal Medicine

## 2021-03-23 LAB — BMP8+ANION GAP
Anion Gap: 15 mmol/L (ref 10.0–18.0)
BUN/Creatinine Ratio: 19 (ref 12–28)
BUN: 57 mg/dL — ABNORMAL HIGH (ref 8–27)
CO2: 21 mmol/L (ref 20–29)
Calcium: 9.5 mg/dL (ref 8.7–10.3)
Chloride: 104 mmol/L (ref 96–106)
Creatinine, Ser: 2.97 mg/dL — ABNORMAL HIGH (ref 0.57–1.00)
Glucose: 89 mg/dL (ref 70–99)
Potassium: 4.1 mmol/L (ref 3.5–5.2)
Sodium: 140 mmol/L (ref 134–144)
eGFR: 15 mL/min/{1.73_m2} — ABNORMAL LOW (ref >59)

## 2021-03-23 LAB — CBC WITH DIFFERENTIAL/PLATELET
Basophils Absolute: 0 10*3/uL (ref 0.0–0.2)
Basos: 1 %
EOS (ABSOLUTE): 0.4 10*3/uL (ref 0.0–0.4)
Eos: 10 %
Hematocrit: 32.6 % — ABNORMAL LOW (ref 34.0–46.6)
Hemoglobin: 11 g/dL — ABNORMAL LOW (ref 11.1–15.9)
Immature Grans (Abs): 0 10*3/uL (ref 0.0–0.1)
Immature Granulocytes: 0 %
Lymphocytes Absolute: 1.2 10*3/uL (ref 0.7–3.1)
Lymphs: 31 %
MCH: 31.6 pg (ref 26.6–33.0)
MCHC: 33.7 g/dL (ref 31.5–35.7)
MCV: 94 fL (ref 79–97)
Monocytes Absolute: 0.3 10*3/uL (ref 0.1–0.9)
Monocytes: 8 %
Neutrophils Absolute: 2 10*3/uL (ref 1.4–7.0)
Neutrophils: 50 %
Platelets: 187 10*3/uL (ref 150–450)
RBC: 3.48 x10E6/uL — ABNORMAL LOW (ref 3.77–5.28)
RDW: 14.6 % (ref 11.7–15.4)
WBC: 3.9 10*3/uL (ref 3.4–10.8)

## 2021-03-23 NOTE — Addendum Note (Signed)
Addended by: [name] on: 03/23/2021 11:35 AM ? ? Modules accepted: Orders ? ?

## 2021-03-23 NOTE — Assessment & Plan Note (Signed)
-   This problem is chronic and stable ?-Patient baseline creatinine has mildly worsened over the last couple of years.  Her baseline creatinine now appears to be between 2.7-3 ?-Repeat BMP showed creatinine stable at 2.97 with a normal potassium ?-Patient follows up with nephrology for this and saw them in February ?-No further work-up at this time ?

## 2021-03-23 NOTE — Assessment & Plan Note (Signed)
BP Readings from Last 3 Encounters:  ?03/22/21 124/64  ?01/08/21 (!) 154/80  ?04/05/20 138/60  ? ? ?Lab Results  ?Component Value Date  ? NA 140 03/22/2021  ? K 4.1 03/22/2021  ? CREATININE 2.97 (H) 03/22/2021  ? ? ?Assessment: ?Blood pressure control:  Well-controlled ?Progress toward BP goal:   At goal ?Comments: Patient is compliant with Lasix 40 mg daily, amlodipine 10 mg daily and labetalol 200 mg twice daily ? ?Plan: ?Medications:  continue current medications ?Educational resources provided:   ?Actor tools provided:   ?Other plans: BMP today shows her creatinine is at baseline.  No further work-up for now ?

## 2021-03-23 NOTE — Progress Notes (Signed)
? ?  Subjective:  ? ? Patient ID: [name], female    DOB: January 10, 1934, 87 y.o.   MRN: [id number] ? ?HPI ? ?I have seen and examined this patient.  Patient is here for routine follow-up of her hypertension and CKD. ? ?Patient states that she feels well today and denies any new complaints.  She recently had an emergency room visit for vaginal prolapse but has not seen a gynecologist for this and states that the problem is improved.  She also states that she is compliant with all her medications. ? ? ?Review of Systems  ?Constitutional: Negative.   ?HENT: Negative.    ?Respiratory: Negative.    ?Cardiovascular: Negative.   ?Gastrointestinal: Negative.   ?Musculoskeletal: Negative.   ?Neurological: Negative.   ?Psychiatric/Behavioral: Negative.    ? ?   ?Objective:  ? Physical Exam ?Constitutional:   ?   Appearance: Normal appearance.  ?HENT:  ?   Head: Normocephalic and atraumatic.  ?Cardiovascular:  ?   Rate and Rhythm: Normal rate and regular rhythm.  ?   Heart sounds: Normal heart sounds.  ?Pulmonary:  ?   Breath sounds: Normal breath sounds. No wheezing or rales.  ?Abdominal:  ?   General: Bowel sounds are normal. There is no distension.  ?   Palpations: Abdomen is soft.  ?   Tenderness: There is no abdominal tenderness.  ?Musculoskeletal:     ?   General: Swelling present. No tenderness.  ?   Cervical back: Neck supple.  ?   Comments: Trace bilateral lower extremity pitting edema noted on exam  ?Lymphadenopathy:  ?   Cervical: No cervical adenopathy.  ?Neurological:  ?   Mental Status: She is alert and oriented to person, place, and time.  ?Psychiatric:     ?   Mood and Affect: Mood normal.     ?   Behavior: Behavior normal.  ? ? ? ? ? ?   ?Assessment & Plan:  ? ?Please see problem based charting for assessment and plan: ?

## 2021-03-23 NOTE — Assessment & Plan Note (Signed)
-   This problem is chronic and stable ?-Patient denies any symptoms of reflux currently but did have recurrent symptoms when we attempted to stop her omeprazole. ?-Patient granddaughter is with patient today and states that her nephrologist wanted to switch her from omeprazole to Pepcid. ?-We will DC omeprazole and transition patient to oral Pepcid ?-Prescription called into pharmacy ?-No further work-up at this time ?

## 2021-03-23 NOTE — Assessment & Plan Note (Signed)
-   This problem is chronic and stable ?-Patient states that her last visit with her neurosurgeon she was told that she did not need to follow-up for another 4 to 5 years since she has been stable over many years now ?-Patient remains asymptomatic (no focal deficits, no headaches) ?-No further work-up at this time ?

## 2021-03-23 NOTE — Assessment & Plan Note (Addendum)
-   This problem is chronic and stable ?-Patient denies any recent gout exacerbations ?-Her allopurinol was increased to 200 mg twice a day by her nephrologist ?-We will check uric acid level  ?-No further work-up at this time ?

## 2021-03-23 NOTE — Assessment & Plan Note (Signed)
-   Pneumonia vaccine given today ?-Patient will receive a Tdap at her next visit ?-No further work-up at this time ?

## 2021-03-23 NOTE — Assessment & Plan Note (Signed)
-   This problem is chronic and stable ?-Patient's hemoglobin is stable at 11 on repeat CBC done at this visit and she was normocytic ?-I suspect the etiology of this normocytic anemia is her CKD ?-We will continue to monitor closely.  No role for EPO at this time ?-No further work-up for now ?

## 2021-03-23 NOTE — Telephone Encounter (Signed)
I called the patient to discuss results of her blood work with her.  Patient's creatinine was 2.97 which is at her baseline.  Her potassium was within normal limits.  Patient's hemoglobin was mildly reduced at 11 which is stable from prior.  I suspect that this is secondary to her CKD.  No further work-up required at this time.  Patient expressed understanding and agreement with plan.  She expressed relief that her creatinine has remained stable and was grateful for the call.   ?

## 2021-03-23 NOTE — Assessment & Plan Note (Signed)
-   Patient was recently seen in the ED in January for vaginal prolapse ?-Granddaughter and patient states that since then she is followed up with gynecology who prescribed a pessary for her but also explained to the patient how to reduce the prolapse on her own ?-Patient was also treated for constipation to avoid straining which would increase her chances for vaginal prolapse ?-Patient states that this problem is currently resolved ?-No further work-up at this time ?

## 2021-03-30 LAB — SPECIMEN STATUS REPORT

## 2021-03-30 LAB — URIC ACID: Uric Acid: 3.7 mg/dL (ref 3.1–7.9)

## 2021-06-03 ENCOUNTER — Other Ambulatory Visit: Payer: Self-pay | Admitting: Internal Medicine

## 2021-06-03 DIAGNOSIS — K219 Gastro-esophageal reflux disease without esophagitis: Secondary | ICD-10-CM

## 2021-06-03 DIAGNOSIS — F32A Depression, unspecified: Secondary | ICD-10-CM

## 2021-07-31 ENCOUNTER — Encounter (HOSPITAL_COMMUNITY): Payer: Self-pay | Admitting: Emergency Medicine

## 2021-07-31 ENCOUNTER — Emergency Department (HOSPITAL_COMMUNITY): Payer: 59

## 2021-07-31 ENCOUNTER — Other Ambulatory Visit: Payer: Self-pay

## 2021-07-31 ENCOUNTER — Observation Stay (HOSPITAL_COMMUNITY)
Admission: EM | Admit: 2021-07-31 | Discharge: 2021-08-02 | Disposition: A | Discharge status: Home / Self Care | Payer: 59 | Attending: Internal Medicine | Admitting: Internal Medicine

## 2021-07-31 DIAGNOSIS — N390 Urinary tract infection, site not specified: Secondary | ICD-10-CM | POA: Diagnosis not present

## 2021-07-31 DIAGNOSIS — Z20822 Contact with and (suspected) exposure to covid-19: Secondary | ICD-10-CM | POA: Insufficient documentation

## 2021-07-31 DIAGNOSIS — I130 Hypertensive heart and chronic kidney disease with heart failure and stage 1 through stage 4 chronic kidney disease, or unspecified chronic kidney disease: Secondary | ICD-10-CM | POA: Insufficient documentation

## 2021-07-31 DIAGNOSIS — I5032 Chronic diastolic (congestive) heart failure: Secondary | ICD-10-CM | POA: Diagnosis not present

## 2021-07-31 DIAGNOSIS — Z7982 Long term (current) use of aspirin: Secondary | ICD-10-CM | POA: Insufficient documentation

## 2021-07-31 DIAGNOSIS — I10 Essential (primary) hypertension: Secondary | ICD-10-CM | POA: Diagnosis not present

## 2021-07-31 DIAGNOSIS — R0602 Shortness of breath: Secondary | ICD-10-CM | POA: Diagnosis not present

## 2021-07-31 DIAGNOSIS — N184 Chronic kidney disease, stage 4 (severe): Secondary | ICD-10-CM | POA: Insufficient documentation

## 2021-07-31 DIAGNOSIS — Z79899 Other long term (current) drug therapy: Secondary | ICD-10-CM | POA: Diagnosis not present

## 2021-07-31 DIAGNOSIS — N3000 Acute cystitis without hematuria: Secondary | ICD-10-CM | POA: Diagnosis not present

## 2021-07-31 DIAGNOSIS — R300 Dysuria: Secondary | ICD-10-CM

## 2021-07-31 DIAGNOSIS — N179 Acute kidney failure, unspecified: Principal | ICD-10-CM | POA: Diagnosis present

## 2021-07-31 DIAGNOSIS — Z8673 Personal history of transient ischemic attack (TIA), and cerebral infarction without residual deficits: Secondary | ICD-10-CM | POA: Diagnosis not present

## 2021-07-31 DIAGNOSIS — K219 Gastro-esophageal reflux disease without esophagitis: Secondary | ICD-10-CM | POA: Diagnosis present

## 2021-07-31 DIAGNOSIS — Z87891 Personal history of nicotine dependence: Secondary | ICD-10-CM | POA: Diagnosis not present

## 2021-07-31 LAB — URINALYSIS, ROUTINE W REFLEX MICROSCOPIC
Bilirubin Urine: NEGATIVE
Glucose, UA: NEGATIVE mg/dL
Hgb urine dipstick: NEGATIVE
Ketones, ur: NEGATIVE mg/dL
Nitrite: NEGATIVE
Protein, ur: NEGATIVE mg/dL
Specific Gravity, Urine: 1.011 (ref 1.005–1.030)
pH: 5 (ref 5.0–8.0)

## 2021-07-31 LAB — CBC WITH DIFFERENTIAL/PLATELET
Abs Immature Granulocytes: 0.01 10*3/uL (ref 0.00–0.07)
Basophils Absolute: 0 10*3/uL (ref 0.0–0.1)
Basophils Relative: 0 %
Eosinophils Absolute: 0.4 10*3/uL (ref 0.0–0.5)
Eosinophils Relative: 8 %
HCT: 34.1 % — ABNORMAL LOW (ref 36.0–46.0)
Hemoglobin: 11 g/dL — ABNORMAL LOW (ref 12.0–15.0)
Immature Granulocytes: 0 %
Lymphocytes Relative: 32 %
Lymphs Abs: 1.4 10*3/uL (ref 0.7–4.0)
MCH: 32 pg (ref 26.0–34.0)
MCHC: 32.3 g/dL (ref 30.0–36.0)
MCV: 99.1 fL (ref 80.0–100.0)
Monocytes Absolute: 0.4 10*3/uL (ref 0.1–1.0)
Monocytes Relative: 9 %
Neutro Abs: 2.2 10*3/uL (ref 1.7–7.7)
Neutrophils Relative %: 51 %
Platelets: 178 10*3/uL (ref 150–400)
RBC: 3.44 MIL/uL — ABNORMAL LOW (ref 3.87–5.11)
RDW: 15 % (ref 11.5–15.5)
WBC: 4.5 10*3/uL (ref 4.0–10.5)
nRBC: 0 % (ref 0.0–0.2)

## 2021-07-31 LAB — RESP PANEL BY RT-PCR (FLU A&B, COVID) ARPGX2
Influenza A by PCR: NEGATIVE
Influenza B by PCR: NEGATIVE
SARS Coronavirus 2 by RT PCR: NEGATIVE

## 2021-07-31 LAB — BASIC METABOLIC PANEL
Anion gap: 10 (ref 5–15)
BUN: 70 mg/dL — ABNORMAL HIGH (ref 8–23)
CO2: 27 mmol/L (ref 22–32)
Calcium: 9.3 mg/dL (ref 8.9–10.3)
Chloride: 102 mmol/L (ref 98–111)
Creatinine, Ser: 4 mg/dL — ABNORMAL HIGH (ref 0.44–1.00)
GFR, Estimated: 10 mL/min — ABNORMAL LOW (ref >60)
Glucose, Bld: 93 mg/dL (ref 70–99)
Potassium: 4.7 mmol/L (ref 3.5–5.1)
Sodium: 139 mmol/L (ref 135–145)

## 2021-07-31 MED ORDER — SODIUM CHLORIDE 0.9 % IV SOLN
1.0000 g | INTRAVENOUS | Status: DC
  Administered 2021-08-01: 1 g via INTRAVENOUS
  Filled 2021-07-31: qty 10

## 2021-07-31 MED ORDER — ACETAMINOPHEN 650 MG RE SUPP: 650.0000 mg | Freq: Four times a day (QID) | RECTAL | Status: DC | PRN

## 2021-07-31 MED ORDER — SODIUM CHLORIDE 0.9 % IV BOLUS (SEPSIS)
500.0000 mL | Freq: Once | INTRAVENOUS | Status: AC
  Administered 2021-07-31: 500 mL via INTRAVENOUS

## 2021-07-31 MED ORDER — LACTATED RINGERS IV SOLN: INTRAVENOUS | Status: AC

## 2021-07-31 MED ORDER — FUROSEMIDE 10 MG/ML IJ SOLN: 20.0000 mg | Freq: Once | INTRAMUSCULAR | Status: DC

## 2021-07-31 MED ORDER — SODIUM CHLORIDE 0.9 % IV SOLN
1.0000 g | Freq: Once | INTRAVENOUS | Status: AC
  Administered 2021-07-31: 1 g via INTRAVENOUS
  Filled 2021-07-31: qty 10

## 2021-07-31 MED ORDER — ACETAMINOPHEN 325 MG PO TABS: 650.0000 mg | ORAL_TABLET | Freq: Four times a day (QID) | ORAL | Status: DC | PRN

## 2021-07-31 NOTE — ED Provider Notes (Signed)
[facility] DEPT Provider Note   CSN: [id number] Arrival date & time: 07/31/21  1824     History  Chief Complaint  Patient presents with   Shortness of Breath   Dysuria      [name] is a 87 y.o. female. With pmh IDA, HTN, CKD, CVA, grade I diastolic dysfunction presenting with dysuria for 1 week and mild dyspnea on exertion and lightheadedness upon ambulation.  Patient is here with complaints of dysuria and polyuria saying for the past week she has been having burning upon urination as well as increased urination and small volumes of urine.  She denies any fevers, abdominal pain, vomiting, diarrhea.  She also notes some mild generalized weakness and some dyspnea on exertion.  However denies any cough, congestion, chest pain.  She denies any leg pain or swelling.  She lives at home alone.  According to triage note, she was sent by her family for dysuria and some mild confusion.   Shortness of Breath Dysuria      Home Medications Prior to Admission medications   Medication Sig Start Date End Date Taking? Authorizing Provider  allopurinol (ZYLOPRIM) 100 MG tablet Take 2 tablets (200 mg total) by mouth daily. 03/22/21 03/22/22  [name], MD  amLODipine (NORVASC) 10 MG tablet TAKE 1 TABLET(10 MG) BY MOUTH AT BEDTIME Patient taking differently: Take 10 mg by mouth daily. 01/06/21   Axel Filler, MD  aspirin EC 81 MG tablet Take 81 mg by mouth at bedtime.     [provider]  buPROPion (WELLBUTRIN XL) 300 MG 24 hr tablet TAKE 1 TABLET(300 MG) BY MOUTH DAILY Patient taking differently: Take 300 mg by mouth daily. 06/05/21   [name], MD  famotidine (PEPCID) 20 MG tablet TAKE 1 TABLET(20 MG) BY MOUTH TWICE DAILY Patient taking differently: Take 20 mg by mouth 2 (two) times daily. 06/05/21   [name], MD  furosemide (LASIX) 80 MG tablet Take 0.5 tablets (40 mg total) by mouth daily. 01/09/21   [name], MD   labetalol (NORMODYNE) 200 MG tablet Take 1 tablet (200 mg total) by mouth 2 (two) times daily. 12/09/19   Werner Lean, MD  meclizine (ANTIVERT) 12.5 MG tablet Take 1 tablet (12.5 mg total) by mouth 2 (two) times daily as needed for dizziness. 03/17/20   Isla Pence, MD  Multiple Vitamins-Minerals (MULTIVITAMIN WITH MINERALS) tablet Take 1 tablet by mouth daily.     [provider]  ondansetron (ZOFRAN ODT) 4 MG disintegrating tablet Take 1 tablet (4 mg total) by mouth every 8 (eight) hours as needed for nausea or vomiting. 03/17/20   Isla Pence, MD  SENSIPAR 30 MG tablet TAKE 1 TABLET BY MOUTH ONCE DAILY Patient taking differently: Take 30 mg by mouth daily with breakfast. 10/02/16   [name], MD      Allergies    Ibuprofen    Review of Systems   Review of Systems  Respiratory:  Positive for shortness of breath.   Genitourinary:  Positive for dysuria.    Physical Exam Updated Vital Signs BP 134/72 (BP Location: Left Arm)   Pulse 76   Temp 99 F (37.2 C) (Oral)   Resp 18   SpO2 99%  Physical Exam Constitutional: Alert and oriented.  Pleasant and nontoxic in no acute distress.  No acute distress upon ambulating. Eyes: Conjunctivae are normal. ENT      Head: Normocephalic and atraumatic.      Nose: No congestion.  Mouth/Throat: Mucous membranes are moist.      Neck: No stridor. Cardiovascular: S1, S2,  Normal and symmetric distal pulses are present in all extremities.Warm and well perfused. Respiratory: Normal respiratory effort.  No increased work of breathing upon ambulation.  Scattered wheezing. Gastrointestinal: Soft and nontender. There is no CVA tenderness. Musculoskeletal: Normal range of motion in all extremities.      Right lower leg: No tenderness or edema.      Left lower leg: No tenderness or edema. Neurologic: Normal speech and language.  Moves all extremities equally, steady ambulatory gait.  No gross focal neurologic deficits  are appreciated. Skin: Skin is warm, dry and intact. No rash noted. Psychiatric: Mood and affect are normal. Speech and behavior are normal.  ED Results / Procedures / Treatments   Labs (all labs ordered are listed, but only abnormal results are displayed) Labs Reviewed  URINALYSIS, ROUTINE W REFLEX MICROSCOPIC - Abnormal; Notable for the following components:      Result Value   Leukocytes,Ua LARGE (*)    Bacteria, UA RARE (*)    All other components within normal limits  CBC WITH DIFFERENTIAL/PLATELET - Abnormal; Notable for the following components:   RBC 3.44 (*)    Hemoglobin 11.0 (*)    HCT 34.1 (*)    All other components within normal limits  BASIC METABOLIC PANEL - Abnormal; Notable for the following components:   BUN 70 (*)    Creatinine, Ser 4.00 (*)    GFR, Estimated 10 (*)    All other components within normal limits  RESP PANEL BY RT-PCR (FLU A&B, COVID) [id number]    EKG EKG Interpretation  Date/Time:  Monday July 31 2021 20:15:39 EDT Ventricular Rate:  76 PR Interval:  163 QRS Duration: 104 QT Interval:  398 QTC Calculation: 448 R Axis:   -36 Text Interpretation: Sinus rhythm Left axis deviation Anterior infarct, old Baseline wander in lead(s) III aVL Confirmed by Georgina Snell ([id number]) on 07/31/2021 10:21:32 PM  Radiology DG Chest 2 View  Result Date: 07/31/2021 CLINICAL DATA:  Dyspnea on exertion. EXAM: CHEST - 2 VIEW COMPARISON:  Chest x-ray 11/05/2019 FINDINGS: Aorta is tortuous. Heart is enlarged, unchanged. There is central pulmonary vascular congestion. There is no focal lung consolidation, pleural effusion or pneumothorax. No acute fractures are identified. IMPRESSION: Cardiomegaly with central pulmonary vascular congestion. Electronically Signed   By: Ronney Asters M.D.   On: 07/31/2021 20:28    Procedures Procedures    Medications Ordered in ED Medications  cefTRIAXone (ROCEPHIN) 1 g in sodium chloride 0.9 % 100 mL IVPB (1 g Intravenous New  Bag/Given 07/31/21 2308)  sodium chloride 0.9 % bolus 500 mL (500 mLs Intravenous New Bag/Given 07/31/21 2308)  acetaminophen (TYLENOL) tablet 650 mg (has no administration in time range)    Or  acetaminophen (TYLENOL) suppository 650 mg (has no administration in time range)    ED Course/ Medical Decision Making/ A&P Clinical Course as of 07/31/21 2312  Mon Jul 31, 2021  2224 Patient's work-up remarkable for UTI with leukocyte esterase large and 21-50 WBCs with bacteria covered with IV Rocephin 1 g.  She is also has concurrent AKI creatinine 4.0 from 2.97.  Potassium is 4.7 within normal limits.  Patient still making urine.  Will trial small bolus IV fluids despite chest x-ray with central pulmonary vascular congestion.  Suspect she will need a repeat echocardiogram.  Will page for admission for continued management and work-up. [VB]  2306 Discussed case with hospitalist who  will put in orders for patient's admission. [VB]    Clinical Course User Index [VB] Elgie Congo, MD                           Medical Decision Making [name] is a 87 y.o. female. With pmh IDA, HTN, CKD, CVA, grade I diastolic dysfunction presenting with dysuria for 1 week and mild dyspnea on exertion and lightheadedness upon ambulation.  Patient well-appearing and not hypotensive or tachycardic or febrile.  No concern for sepsis no SIRS criteria.  No flank tenderness or symptoms concerning for nephrolithiasis or concerning abdominal exam requiring CT imaging at this time.  Will obtain UA to evaluate for UTI as well as chest x-ray to further evaluate for any evidence of pulmonary edema, pneumonia or other abnormalities contributing to mild shortness of breath on exertion.  No chest pain concerning for atypical ACS but will obtain screening EKG.  Will obtain labs to evaluate for any acute electrolyte abnormality or AKI.  No focal deficits concerning for CVA or intracranial abnormality.  Plan to obtain CBC, BMP,  UA, chest x-ray, EKG. Disposition pending workup and reassessment.  Overall well-appearing and ambulatory with no dyspnea on exertion, no hypoxia, if work-up reassuring, likely discharge home.   Discussion of Management with other Physicians, QHP or Appropriate Source: Hospitalist for admission Independent Interpretation of Studies: RAD cardiomegaly, no pneumothorax, no pleural effusion External Records Reviewed: Last ECHO 2021 EF 60-65% with grade I diastolic dysfunction Escalation of Care, Consideration of Admission/Observation/Transfer: Admit Social determinants that significantly affected care: Lives alone Prescription drug(s) considered but not prescribed: None Diagnostic tests considered but not performed: None History obtained from other sources: None    Amount and/or Complexity of Data Reviewed Labs: ordered. Radiology: ordered. ECG/medicine tests: ordered.    Final Clinical Impression(s) / ED Diagnoses Final diagnoses:  Urinary tract infection without hematuria, site unspecified  Dysuria  AKI (acute kidney injury) [facility])    Rx / DC Orders ED Discharge Orders     None         Elgie Congo, MD 07/31/21 2312

## 2021-07-31 NOTE — ED Notes (Signed)
Patient currently sitting in recliner in [place] beside triage nurses station.  Patient appears to be in NAD. Patient's daughter Judeth Porch contacted for more information.  Patient's daughter reports she has had no complaints for the past few days, but apparently told another family member that she has had burning with urination.

## 2021-07-31 NOTE — H&P (Signed)
History and Physical    PLEASE NOTE THAT DRAGON DICTATION SOFTWARE WAS USED IN THE CONSTRUCTION OF THIS NOTE.   [name] [id number] DOB: 1934-09-01 DOA: 07/31/2021  PCP: [name], MD *** Patient coming from: home ***  I have personally briefly reviewed patient's old medical records in [place]  Chief Complaint: ***  HPI: [name] is a 87 y.o. female with medical history significant for *** who is admitted to [facility] on 07/31/2021 with *** after presenting from home*** to [facility] ED complaining of ***.    ***    ***SOB: Denies any associated orthopnea, PND, or new onset peripheral edema. No recent chest pain, diaphoresis, palpitations, N/V, pre-syncope, or syncope. Not associated with any recent cough, wheezing, hemoptysis, new lower extremity erythema, or calf tenderness. Denies any recent trauma, travel, surgical procedures, or periods of prolonged diminished ambulatory status. No recent melena or hematochezia.   Denies any associated subjective fever, chills, rigors, or generalized myalgias. No recent headache, neck stiffness, rhinitis, rhinorrhea, sore throat, abdominal pain, diarrhea, or rash. No known recent COVID-19 exposures. Denies dysuria, gross hematuria, or change in urinary urgency/frequency.  ***   ***misc/infectious: Denies any subjective fever, chills, rigors, or generalized myalgias. Denies any recent headache, neck stiffness, rhinitis, rhinorrhea, sore throat, sob, wheezing, cough, nausea, vomiting, abdominal pain, diarrhea, or rash. No recent traveling or known COVID-19 exposures. Denies dysuria, gross hematuria, or change in urinary urgency/frequency.  Denies any recent chest pain, diaphoresis, or palpitations. ***    ED Course:  Vital signs in the ED were notable for the following: ***  Labs were notable for the following: ***  Imaging and additional notable ED work-up: ***  While in the ED, the following were  administered: ***  Subsequently, the patient was admitted  ***  ***red   Review of Systems: As per HPI otherwise 10 point review of systems negative.   Past Medical History:  Diagnosis Date   Anemia due to blood loss, chronic    Anemia, iron deficiency    Angiodysplasia of colon 05/09/2005   Single small angiodysplastic lesion in the descending colon found on colonoscopy 05/09/2005 by [name]   Arthritis    "knots on my thumbs; left elbow" (03/03/2014)   Cataracts, bilateral    Chest pain, non-cardiac 05/30/2017   Chronic ankle pain    bilateral   Chronic kidney disease, stage IV (severe) ([place])    [name] 02/09/2014   Chronic knee pain    bilateral   Chronic thumb pain    Depression    Diverticulitis of large intestine without perforation or abscess without bleeding 05/21/2018   Ear noise 01/07/2013   Epicondylitis    Fatigue 8/34/1962   Gastritis, Helicobacter pylori    Noted on EGD 01/18/2009 by [name];  biopsy showed chronic gastritis with Helicobacter pylori, treated by [name].   GERD (gastroesophageal reflux disease)    Gout    Hemorrhoids, internal 05/09/2005    Found on colonoscopy 05/09/2005 by [name]   History of blood transfusion    "low blood count"   Hypertension    Osteoporosis, unspecified 03/06/2012   A DXA bone density scan done 02/20/2012 showed osteoporosis with a lumbar spine T-score of -4.4 and a right femur T-score of -2.8.  Patient has chronic kidney disease with estimated GFR less than 30, and it is not clear how much of her osteoporosis may be due to renal osteodystrophy.     Pericardial  effusion 11/2004   S/P subxiphoid pericardial window 11/01/2004 by [name].  A question of possible collagen vascular disease had been raised in 2006 due to arthralgias, a history of idiopathic pericarditis, and increased pigmentation of her palms, and she was referred to [name] but he was unable to confirm a rheumatologic  diagnosis.   Pinguecula    Renal failure    Formerly on hemodialysis; managed by Dr. Corliss Parish   Stiffness of joint, not elsewhere classified, hand    Stroke [facility]) 2007   "when my kidneys went out & I was in a coma; weak LUE/LLE since"   Subjective tinnitus of both ears 01/24/2018   Thigh pain    UTI (urinary tract infection) 03/21/2011   Vitreous detachment    "no OR"    Past Surgical History:  Procedure Laterality Date   ABDOMINAL HYSTERECTOMY  1960's   ARTERIOVENOUS GRAFT PLACEMENT Left 11/28/2005   forearm arteriovenous   CARDIAC CATHETERIZATION  01/2005   [name] 05/15/2010   HALLUX VALGUS CORRECTION Left 01/2012   foot   SUBXYPHOID PERICARDIAL WINDOW  11/01/2004   THROMBECTOMY Left  03/06/2006   Simple thrombectomy arteriovenous Gore-Tex graft, left arm, with exploration of venous end and intraoperative shuntogram.          THROMBECTOMY / ARTERIOVENOUS GRAFT REVISION Left 01/21/2006   Thrombectomy and revision of left forearm loop AV graft.   TUBAL LIGATION  1960's   "before hysterectomy"    Social History:  reports that she quit smoking about 61 years ago. Her smoking use included cigarettes. She has a 0.10 pack-year smoking history. She has never used smokeless tobacco. She reports that she does not drink alcohol and does not use drugs.   Allergies  Allergen Reactions   Ibuprofen Other (See Comments)    PT limited intake because of chronic kidney DX    Family History  Problem Relation Age of Onset   Hypertension Brother    Heart disease Father    Diabetes Brother    Breast cancer Neg Hx    Colon cancer Neg Hx    Lung cancer Neg Hx    Cervical cancer Neg Hx    Sickle cell anemia Neg Hx     Family history reviewed and not pertinent ***   Prior to Admission medications   Medication Sig Start Date End Date Taking? Authorizing Provider  allopurinol (ZYLOPRIM) 100 MG tablet Take 2 tablets (200 mg total) by mouth daily. 03/22/21 03/22/22  [name], MD  amLODipine (NORVASC) 10 MG tablet TAKE 1 TABLET(10 MG) BY MOUTH AT BEDTIME Patient taking differently: Take 10 mg by mouth daily. 01/06/21   Axel Filler, MD  aspirin EC 81 MG tablet Take 81 mg by mouth at bedtime.     [provider]  buPROPion (WELLBUTRIN XL) 300 MG 24 hr tablet TAKE 1 TABLET(300 MG) BY MOUTH DAILY Patient taking differently: Take 300 mg by mouth daily. 06/05/21   [name], MD  famotidine (PEPCID) 20 MG tablet TAKE 1 TABLET(20 MG) BY MOUTH TWICE DAILY Patient taking differently: Take 20 mg by mouth 2 (two) times daily. 06/05/21   [name], MD  furosemide (LASIX) 80 MG tablet Take 0.5 tablets (40 mg total) by mouth daily. 01/09/21   [name], MD  labetalol (NORMODYNE) 200 MG tablet Take 1 tablet (200 mg total) by mouth 2 (two) times daily. 12/09/19   Werner Lean, MD  meclizine (ANTIVERT) 12.5 MG tablet Take 1 tablet (12.5  mg total) by mouth 2 (two) times daily as needed for dizziness. 03/17/20   Isla Pence, MD  Multiple Vitamins-Minerals (MULTIVITAMIN WITH MINERALS) tablet Take 1 tablet by mouth daily.     [provider]  ondansetron (ZOFRAN ODT) 4 MG disintegrating tablet Take 1 tablet (4 mg total) by mouth every 8 (eight) hours as needed for nausea or vomiting. 03/17/20   Isla Pence, MD  SENSIPAR 30 MG tablet TAKE 1 TABLET BY MOUTH ONCE DAILY Patient taking differently: Take 30 mg by mouth daily with breakfast. 10/02/16   [name], MD     Objective    Physical Exam: Vitals:   07/31/21 1834 07/31/21 1842  BP: 134/72   Pulse: 76   Resp: 18   Temp: 99 F (37.2 C)   TempSrc: Oral   SpO2: 100% 99%    General: appears to be stated age; alert, oriented Skin: warm, dry, no rash Head:  AT/[place] Mouth:  Oral mucosa membranes appear moist, normal dentition Neck: supple; trachea midline Heart:  RRR; did not appreciate any M/R/G Lungs: CTAB, did not appreciate any wheezes, rales, or  rhonchi Abdomen: + BS; soft, ND, NT Vascular: 2+ pedal pulses b/l; 2+ radial pulses b/l Extremities: no peripheral edema, no muscle wasting Neuro: strength and sensation intact in upper and lower extremities b/l    *** Neuro: 5/5 strength of the proximal and distal flexors and extensors of the upper and lower extremities bilaterally; sensation intact in upper and lower extremities b/l; cranial nerves II through XII grossly intact; no pronator drift; no evidence suggestive of slurred speech, dysarthria, or facial droop; Normal muscle tone. No tremors. *** Neuro: In the setting of the patient's current mental status and associated inability to follow instructions, unable to perform full neurologic exam at this time.  As such, assessment of strength, sensation, and cranial nerves is limited at this time. Patient noted to spontaneously move all 4 extremities. No tremors.  ***    Labs on Admission: I have personally reviewed following labs and imaging studies  CBC: Recent Labs  Lab 07/31/21 1951  WBC 4.5  NEUTROABS 2.2  HGB 11.0*  HCT 34.1*  MCV 99.1  PLT 308   Basic Metabolic Panel: Recent Labs  Lab 07/31/21 2126  NA 139  K 4.7  CL 102  CO2 27  GLUCOSE 93  BUN 70*  CREATININE 4.00*  CALCIUM 9.3   GFR: CrCl cannot be calculated (Unknown ideal weight.). Liver Function Tests: No results for input(s): "AST", "ALT", "ALKPHOS", "BILITOT", "PROT", "ALBUMIN" in the last 168 hours. No results for input(s): "LIPASE", "AMYLASE" in the last 168 hours. No results for input(s): "AMMONIA" in the last 168 hours. Coagulation Profile: No results for input(s): "INR", "PROTIME" in the last 168 hours. Cardiac Enzymes: No results for input(s): "CKTOTAL", "CKMB", "CKMBINDEX", "TROPONINI" in the last 168 hours. BNP (last 3 results) No results for input(s): "PROBNP" in the last 8760 hours. HbA1C: No results for input(s): "HGBA1C" in the last 72 hours. CBG: No results for input(s):  "GLUCAP" in the last 168 hours. Lipid Profile: No results for input(s): "CHOL", "HDL", "LDLCALC", "TRIG", "CHOLHDL", "LDLDIRECT" in the last 72 hours. Thyroid Function Tests: No results for input(s): "TSH", "T4TOTAL", "FREET4", "T3FREE", "THYROIDAB" in the last 72 hours. Anemia Panel: No results for input(s): "VITAMINB12", "FOLATE", "FERRITIN", "TIBC", "IRON", "RETICCTPCT" in the last 72 hours. Urine analysis:    Component Value Date/Time   COLORURINE YELLOW 07/31/2021 1957   APPEARANCEUR CLEAR 07/31/2021 1957   LABSPEC 1.011  07/31/2021 1957   PHURINE 5.0 07/31/2021 1957   GLUCOSEU NEGATIVE 07/31/2021 1957   HGBUR NEGATIVE 07/31/2021 1957   BILIRUBINUR NEGATIVE 07/31/2021 1957   KETONESUR NEGATIVE 07/31/2021 1957   PROTEINUR NEGATIVE 07/31/2021 1957   UROBILINOGEN 0.2 06/02/2013 0417   NITRITE NEGATIVE 07/31/2021 1957   LEUKOCYTESUR LARGE (A) 07/31/2021 1957    Radiological Exams on Admission: DG Chest 2 View  Result Date: 07/31/2021 CLINICAL DATA:  Dyspnea on exertion. EXAM: CHEST - 2 VIEW COMPARISON:  Chest x-ray 11/05/2019 FINDINGS: Aorta is tortuous. Heart is enlarged, unchanged. There is central pulmonary vascular congestion. There is no focal lung consolidation, pleural effusion or pneumothorax. No acute fractures are identified. IMPRESSION: Cardiomegaly with central pulmonary vascular congestion. Electronically Signed   By: Ronney Asters M.D.   On: 07/31/2021 20:28     EKG: Independently reviewed, with result as described above. ***   Assessment/Plan    Principal Problem:   Acute renal failure superimposed on stage 4 chronic kidney disease (HCC)  ***      ***            ***       DVT prophylaxis: SCD's ***  Code Status: Full code*** Family Communication: none*** Disposition Plan: Per Rounding Team Consults called: none***;  Admission status: ***   PLEASE NOTE THAT DRAGON DICTATION SOFTWARE WAS USED IN THE CONSTRUCTION OF THIS  NOTE.   Luther DO [facility] [place]   07/31/2021, 11:15 PM   ***

## 2021-07-31 NOTE — ED Triage Notes (Signed)
Per EMS- patient lives at home with her daughter who is POA. Patient's daughter reports that the patient has dementia and memory issues.  Daughter reports that the patient has SOB with exertion and burning with urination.

## 2021-07-31 NOTE — ED Notes (Signed)
Patient ambulated around the hallway with assistance. Patients oxygen levels maintained at 93% while ambulating.

## 2021-08-01 ENCOUNTER — Encounter (HOSPITAL_COMMUNITY): Payer: Self-pay | Admitting: Internal Medicine

## 2021-08-01 DIAGNOSIS — N3000 Acute cystitis without hematuria: Secondary | ICD-10-CM | POA: Diagnosis not present

## 2021-08-01 DIAGNOSIS — N179 Acute kidney failure, unspecified: Secondary | ICD-10-CM | POA: Diagnosis not present

## 2021-08-01 DIAGNOSIS — N184 Chronic kidney disease, stage 4 (severe): Secondary | ICD-10-CM

## 2021-08-01 DIAGNOSIS — I5032 Chronic diastolic (congestive) heart failure: Secondary | ICD-10-CM | POA: Diagnosis present

## 2021-08-01 LAB — COMPREHENSIVE METABOLIC PANEL
ALT: 20 U/L (ref 0–44)
AST: 25 U/L (ref 15–41)
Albumin: 3.3 g/dL — ABNORMAL LOW (ref 3.5–5.0)
Alkaline Phosphatase: 146 U/L — ABNORMAL HIGH (ref 38–126)
Anion gap: 10 (ref 5–15)
BUN: 64 mg/dL — ABNORMAL HIGH (ref 8–23)
CO2: 22 mmol/L (ref 22–32)
Calcium: 8.8 mg/dL — ABNORMAL LOW (ref 8.9–10.3)
Chloride: 109 mmol/L (ref 98–111)
Creatinine, Ser: 3.57 mg/dL — ABNORMAL HIGH (ref 0.44–1.00)
GFR, Estimated: 12 mL/min — ABNORMAL LOW (ref >60)
Glucose, Bld: 108 mg/dL — ABNORMAL HIGH (ref 70–99)
Potassium: 4.1 mmol/L (ref 3.5–5.1)
Sodium: 141 mmol/L (ref 135–145)
Total Bilirubin: 0.3 mg/dL (ref 0.3–1.2)
Total Protein: 6.1 g/dL — ABNORMAL LOW (ref 6.5–8.1)

## 2021-08-01 LAB — PROTIME-INR
INR: 1 (ref 0.8–1.2)
Prothrombin Time: 13.5 seconds (ref 11.4–15.2)

## 2021-08-01 LAB — CBC WITH DIFFERENTIAL/PLATELET
Abs Immature Granulocytes: 0.01 10*3/uL (ref 0.00–0.07)
Basophils Absolute: 0 10*3/uL (ref 0.0–0.1)
Basophils Relative: 1 %
Eosinophils Absolute: 0.4 10*3/uL (ref 0.0–0.5)
Eosinophils Relative: 8 %
HCT: 31.3 % — ABNORMAL LOW (ref 36.0–46.0)
Hemoglobin: 10.2 g/dL — ABNORMAL LOW (ref 12.0–15.0)
Immature Granulocytes: 0 %
Lymphocytes Relative: 36 %
Lymphs Abs: 1.7 10*3/uL (ref 0.7–4.0)
MCH: 32.4 pg (ref 26.0–34.0)
MCHC: 32.6 g/dL (ref 30.0–36.0)
MCV: 99.4 fL (ref 80.0–100.0)
Monocytes Absolute: 0.4 10*3/uL (ref 0.1–1.0)
Monocytes Relative: 9 %
Neutro Abs: 2.2 10*3/uL (ref 1.7–7.7)
Neutrophils Relative %: 46 %
Platelets: 160 10*3/uL (ref 150–400)
RBC: 3.15 MIL/uL — ABNORMAL LOW (ref 3.87–5.11)
RDW: 14.6 % (ref 11.5–15.5)
WBC: 4.7 10*3/uL (ref 4.0–10.5)
nRBC: 0 % (ref 0.0–0.2)

## 2021-08-01 LAB — BRAIN NATRIURETIC PEPTIDE: B Natriuretic Peptide: 347.3 pg/mL — ABNORMAL HIGH (ref 0.0–100.0)

## 2021-08-01 LAB — MAGNESIUM: Magnesium: 2.3 mg/dL (ref 1.7–2.4)

## 2021-08-01 LAB — CREATININE, URINE, RANDOM: Creatinine, Urine: 91 mg/dL

## 2021-08-01 LAB — SODIUM, URINE, RANDOM: Sodium, Ur: 39 mmol/L

## 2021-08-01 MED ORDER — CINACALCET HCL 30 MG PO TABS: 30.0000 mg | ORAL_TABLET | Freq: Every day | ORAL | Status: DC

## 2021-08-01 MED ORDER — BUPROPION HCL ER (XL) 300 MG PO TB24
300.0000 mg | ORAL_TABLET | Freq: Every day | ORAL | Status: DC
  Administered 2021-08-01 – 2021-08-02 (×2): 300 mg via ORAL
  Filled 2021-08-01: qty 1
  Filled 2021-08-01: qty 2

## 2021-08-01 MED ORDER — ASPIRIN 81 MG PO TBEC: 81.0000 mg | DELAYED_RELEASE_TABLET | Freq: Every day | ORAL | Status: DC

## 2021-08-01 MED ORDER — LACTATED RINGERS IV SOLN
INTRAVENOUS | Status: DC
Start: 2021-08-01 — End: 2021-08-02

## 2021-08-01 MED ORDER — AMLODIPINE BESYLATE 10 MG PO TABS
10.0000 mg | ORAL_TABLET | Freq: Every day | ORAL | Status: DC
  Administered 2021-08-01 – 2021-08-02 (×2): 10 mg via ORAL
  Filled 2021-08-01: qty 2
  Filled 2021-08-01: qty 1

## 2021-08-01 MED ORDER — FAMOTIDINE 20 MG PO TABS
20.0000 mg | ORAL_TABLET | Freq: Two times a day (BID) | ORAL | Status: DC
  Administered 2021-08-01 – 2021-08-02 (×3): 20 mg via ORAL
  Filled 2021-08-01 (×3): qty 1

## 2021-08-01 NOTE — Assessment & Plan Note (Signed)
-   continue pepcid

## 2021-08-01 NOTE — ED Notes (Signed)
Pt standby assist to restroom

## 2021-08-01 NOTE — Assessment & Plan Note (Signed)
-  patient has history of CKD4. Baseline creat ~ 2.5, eGFR 21 - patient presents with increase in creat >0.3 mg/dL above baseline, creat increase >1.5x baseline presumed to have occurred within past 7 days PTA - creat 4 on admission -Treated with fluids during hospitalization - Creatinine improved to 2.67 at time of discharge

## 2021-08-01 NOTE — Hospital Course (Signed)
[name] is an 87 year old female with PMH CKD 4, diastolic CHF, anemia of chronic disease, depression, diverticulosis, GERD, gout, HTN, osteoporosis, CVA who presented with complaints of dysuria.  She endorsed burning with urination and increase in urinary urgency prior to admission.  She was not on any antibiotics prior to admission.  Urinalysis was notable for large LE, 21-50 WBC, rare bacteria.  BMP was also elevated above baseline, creatinine 4 on admission. She was admitted for fluid resuscitation and UTI work-up.

## 2021-08-01 NOTE — ED Notes (Signed)
Pt ambulatory to restroom w/out assistance. Instructed pt to pull call cord when she is finished.

## 2021-08-01 NOTE — Progress Notes (Signed)
Progress Note    [name]   KVQ:[id number]  DOB: Jun 22, 1934  DOA: 07/31/2021     0 PCP: [name], MD  Initial CC: urinary urgency  Hospital Course: [name] is an 87 year old female with PMH CKD 4, diastolic CHF, anemia of chronic disease, depression, diverticulosis, GERD, gout, HTN, osteoporosis, CVA who presented with complaints of dysuria.  She endorsed burning with urination and increase in urinary urgency prior to admission.  She was not on any antibiotics prior to admission.  Urinalysis was notable for large LE, 21-50 WBC, rare bacteria.  BMP was also elevated above baseline, creatinine 4 on admission. She was admitted for fluid resuscitation and UTI work-up.  Interval History: Seen in the ER this morning resting in bed. Confirms her symptoms included dysuria and urgency.   Assessment and Plan: * Acute renal failure superimposed on stage 4 chronic kidney disease ([place]) - patient has history of CKD4. Baseline creat ~ 2.5, eGFR 21 - patient presents with increase in creat >0.3 mg/dL above baseline, creat increase >1.5x baseline presumed to have occurred within past 7 days PTA - creat 4 on admission - continue IVF - repeat BMP in am   Acute cystitis - UA consistent with UTI given symptoms of dysuria and urgency - Continue Rocephin - Follow-up urine culture  Chronic diastolic CHF (congestive heart failure) ([place]) - No signs or symptoms of exacerbation - Last echo, November 2021: EF 60 to 65%.  Mild LVH, grade 1 diastolic dysfunction  GERD (gastroesophageal reflux disease) - continue pepcid   Essential hypertension - continue amlodipine     Old records reviewed in assessment of this patient  Antimicrobials: Rocephin 7/31 >> current   DVT prophylaxis:  SCDs Start: 07/31/21 2312   Code Status:   Code Status: Full Code  Mobility Assessment (last 72 hours)     Mobility Assessment     Row Name 08/01/21 1503           Does patient have an order  for bedrest or is patient medically unstable No - Continue assessment       What is the highest level of mobility based on the progressive mobility assessment? Level 5 (Walks with assist in room/hall) - Balance while stepping forward/back and can walk in room with assist - Complete                Disposition Plan:  Home Wednesday Status is: Obs  Objective: Blood pressure (!) 148/80, pulse 82, temperature 98.1 F (36.7 C), temperature source Oral, resp. rate (!) 22, SpO2 100 %.  Examination:  Physical Exam Constitutional:      Appearance: Normal appearance.  HENT:     Head: Normocephalic and atraumatic.     Mouth/Throat:     Mouth: Mucous membranes are moist.  Eyes:     Extraocular Movements: Extraocular movements intact.  Cardiovascular:     Rate and Rhythm: Normal rate and regular rhythm.  Pulmonary:     Effort: Pulmonary effort is normal. No respiratory distress.     Breath sounds: Normal breath sounds. No wheezing.  Abdominal:     General: Bowel sounds are normal. There is no distension.     Palpations: Abdomen is soft.     Tenderness: There is no abdominal tenderness.  Musculoskeletal:        General: Normal range of motion.     Cervical back: Normal range of motion and neck supple.  Skin:    General: Skin is warm and dry.  Neurological:     General: No focal deficit present.     Mental Status: She is alert.  Psychiatric:        Mood and Affect: Mood normal.      Consultants:    Procedures:    Data Reviewed: Results for orders placed or performed during the hospital encounter of 07/31/21 (from the past 24 hour(s))  CBC with Differential     Status: Abnormal   Collection Time: 07/31/21  7:51 PM  Result Value Ref Range   WBC 4.5 4.0 - 10.5 K/uL   RBC 3.44 (L) 3.87 - 5.11 MIL/uL   Hemoglobin 11.0 (L) 12.0 - 15.0 g/dL   HCT 34.1 (L) 36.0 - 46.0 %   MCV 99.1 80.0 - 100.0 fL   MCH 32.0 26.0 - 34.0 pg   MCHC 32.3 30.0 - 36.0 g/dL   RDW 15.0 11.5 - 15.5 %    Platelets 178 150 - 400 K/uL   nRBC 0.0 0.0 - 0.2 %   Neutrophils Relative % 51 %   Neutro Abs 2.2 1.7 - 7.7 K/uL   Lymphocytes Relative 32 %   Lymphs Abs 1.4 0.7 - 4.0 K/uL   Monocytes Relative 9 %   Monocytes Absolute 0.4 0.1 - 1.0 K/uL   Eosinophils Relative 8 %   Eosinophils Absolute 0.4 0.0 - 0.5 K/uL   Basophils Relative 0 %   Basophils Absolute 0.0 0.0 - 0.1 K/uL   Immature Granulocytes 0 %   Abs Immature Granulocytes 0.01 0.00 - 0.07 K/uL  Urinalysis, Routine w reflex microscopic Urine, Clean Catch     Status: Abnormal   Collection Time: 07/31/21  7:57 PM  Result Value Ref Range   Color, Urine YELLOW YELLOW   APPearance CLEAR CLEAR   Specific Gravity, Urine 1.011 1.005 - 1.030   pH 5.0 5.0 - 8.0   Glucose, UA NEGATIVE NEGATIVE mg/dL   Hgb urine dipstick NEGATIVE NEGATIVE   Bilirubin Urine NEGATIVE NEGATIVE   Ketones, ur NEGATIVE NEGATIVE mg/dL   Protein, ur NEGATIVE NEGATIVE mg/dL   Nitrite NEGATIVE NEGATIVE   Leukocytes,Ua LARGE (A) NEGATIVE   RBC / HPF 0-5 0 - 5 RBC/hpf   WBC, UA 21-50 0 - 5 WBC/hpf   Bacteria, UA RARE (A) NONE SEEN   Squamous Epithelial / LPF 0-5 0 - 5   Mucus PRESENT   Resp Panel by RT-PCR (Flu A&B, Covid) Anterior Nasal Swab     Status: None   Collection Time: 07/31/21  8:25 PM   Specimen: Anterior Nasal Swab  Result Value Ref Range   SARS Coronavirus 2 by RT PCR NEGATIVE NEGATIVE   Influenza A by PCR NEGATIVE NEGATIVE   Influenza B by PCR NEGATIVE NEGATIVE  Basic metabolic panel     Status: Abnormal   Collection Time: 07/31/21  9:26 PM  Result Value Ref Range   Sodium 139 135 - 145 mmol/L   Potassium 4.7 3.5 - 5.1 mmol/L   Chloride 102 98 - 111 mmol/L   CO2 27 22 - 32 mmol/L   Glucose, Bld 93 70 - 99 mg/dL   BUN 70 (H) 8 - 23 mg/dL   Creatinine, Ser 4.00 (H) 0.44 - 1.00 mg/dL   Calcium 9.3 8.9 - 10.3 mg/dL   GFR, Estimated 10 (L) >60 mL/min   Anion gap 10 5 - 15  CBC with Differential/Platelet     Status: Abnormal   Collection  Time: 08/01/21  6:50 AM  Result Value Ref Range  WBC 4.7 4.0 - 10.5 K/uL   RBC 3.15 (L) 3.87 - 5.11 MIL/uL   Hemoglobin 10.2 (L) 12.0 - 15.0 g/dL   HCT 31.3 (L) 36.0 - 46.0 %   MCV 99.4 80.0 - 100.0 fL   MCH 32.4 26.0 - 34.0 pg   MCHC 32.6 30.0 - 36.0 g/dL   RDW 14.6 11.5 - 15.5 %   Platelets 160 150 - 400 K/uL   nRBC 0.0 0.0 - 0.2 %   Neutrophils Relative % 46 %   Neutro Abs 2.2 1.7 - 7.7 K/uL   Lymphocytes Relative 36 %   Lymphs Abs 1.7 0.7 - 4.0 K/uL   Monocytes Relative 9 %   Monocytes Absolute 0.4 0.1 - 1.0 K/uL   Eosinophils Relative 8 %   Eosinophils Absolute 0.4 0.0 - 0.5 K/uL   Basophils Relative 1 %   Basophils Absolute 0.0 0.0 - 0.1 K/uL   Immature Granulocytes 0 %   Abs Immature Granulocytes 0.01 0.00 - 0.07 K/uL  Comprehensive metabolic panel     Status: Abnormal   Collection Time: 08/01/21  6:50 AM  Result Value Ref Range   Sodium 141 135 - 145 mmol/L   Potassium 4.1 3.5 - 5.1 mmol/L   Chloride 109 98 - 111 mmol/L   CO2 22 22 - 32 mmol/L   Glucose, Bld 108 (H) 70 - 99 mg/dL   BUN 64 (H) 8 - 23 mg/dL   Creatinine, Ser 3.57 (H) 0.44 - 1.00 mg/dL   Calcium 8.8 (L) 8.9 - 10.3 mg/dL   Total Protein 6.1 (L) 6.5 - 8.1 g/dL   Albumin 3.3 (L) 3.5 - 5.0 g/dL   AST 25 15 - 41 U/L   ALT 20 0 - 44 U/L   Alkaline Phosphatase 146 (H) 38 - 126 U/L   Total Bilirubin 0.3 0.3 - 1.2 mg/dL   GFR, Estimated 12 (L) >60 mL/min   Anion gap 10 5 - 15  Magnesium     Status: None   Collection Time: 08/01/21  6:50 AM  Result Value Ref Range   Magnesium 2.3 1.7 - 2.4 mg/dL  Brain natriuretic peptide     Status: Abnormal   Collection Time: 08/01/21  6:50 AM  Result Value Ref Range   B Natriuretic Peptide 347.3 (H) 0.0 - 100.0 pg/mL  Protime-INR     Status: None   Collection Time: 08/01/21  2:33 PM  Result Value Ref Range   Prothrombin Time 13.5 11.4 - 15.2 seconds   INR 1.0 0.8 - 1.2    I have Reviewed nursing notes, Vitals, and Lab results since pt's last encounter.  Pertinent lab results : see above I have ordered test including BMP, CBC, Mg I have reviewed the last note from staff over past 24 hours I have discussed pt's care plan and test results with nursing staff, case manager   LOS: 0 days   Dwyane Dee, MD [facility] 08/01/2021, 6:22 PM

## 2021-08-01 NOTE — Assessment & Plan Note (Signed)
-   No signs or symptoms of exacerbation - Last echo, November 2021: EF 60 to 65%.  Mild LVH, grade 1 diastolic dysfunction

## 2021-08-01 NOTE — Assessment & Plan Note (Signed)
-   continue amlodipine. 

## 2021-08-01 NOTE — Plan of Care (Signed)

## 2021-08-01 NOTE — Assessment & Plan Note (Signed)
-   UA consistent with UTI given symptoms of dysuria and urgency -Treated with Rocephin during hospitalization and de-escalated to cefadroxil at discharge to complete course -Urine culture pending at time of discharge and will contact patient if species returns resistant

## 2021-08-02 ENCOUNTER — Other Ambulatory Visit: Payer: Self-pay

## 2021-08-02 DIAGNOSIS — N184 Chronic kidney disease, stage 4 (severe): Secondary | ICD-10-CM | POA: Diagnosis not present

## 2021-08-02 DIAGNOSIS — N179 Acute kidney failure, unspecified: Secondary | ICD-10-CM | POA: Diagnosis not present

## 2021-08-02 DIAGNOSIS — N3000 Acute cystitis without hematuria: Secondary | ICD-10-CM | POA: Diagnosis not present

## 2021-08-02 LAB — CBC WITH DIFFERENTIAL/PLATELET
Abs Immature Granulocytes: 0.01 10*3/uL (ref 0.00–0.07)
Basophils Absolute: 0 10*3/uL (ref 0.0–0.1)
Basophils Relative: 1 %
Eosinophils Absolute: 0.4 10*3/uL (ref 0.0–0.5)
Eosinophils Relative: 9 %
HCT: 33.1 % — ABNORMAL LOW (ref 36.0–46.0)
Hemoglobin: 10.7 g/dL — ABNORMAL LOW (ref 12.0–15.0)
Immature Granulocytes: 0 %
Lymphocytes Relative: 34 %
Lymphs Abs: 1.3 10*3/uL (ref 0.7–4.0)
MCH: 31.9 pg (ref 26.0–34.0)
MCHC: 32.3 g/dL (ref 30.0–36.0)
MCV: 98.8 fL (ref 80.0–100.0)
Monocytes Absolute: 0.3 10*3/uL (ref 0.1–1.0)
Monocytes Relative: 8 %
Neutro Abs: 1.9 10*3/uL (ref 1.7–7.7)
Neutrophils Relative %: 48 %
Platelets: 173 10*3/uL (ref 150–400)
RBC: 3.35 MIL/uL — ABNORMAL LOW (ref 3.87–5.11)
RDW: 14.8 % (ref 11.5–15.5)
WBC: 3.9 10*3/uL — ABNORMAL LOW (ref 4.0–10.5)
nRBC: 0 % (ref 0.0–0.2)

## 2021-08-02 LAB — BASIC METABOLIC PANEL
Anion gap: 7 (ref 5–15)
BUN: 55 mg/dL — ABNORMAL HIGH (ref 8–23)
CO2: 26 mmol/L (ref 22–32)
Calcium: 9.2 mg/dL (ref 8.9–10.3)
Chloride: 108 mmol/L (ref 98–111)
Creatinine, Ser: 2.67 mg/dL — ABNORMAL HIGH (ref 0.44–1.00)
GFR, Estimated: 17 mL/min — ABNORMAL LOW (ref >60)
Glucose, Bld: 86 mg/dL (ref 70–99)
Potassium: 4.5 mmol/L (ref 3.5–5.1)
Sodium: 141 mmol/L (ref 135–145)

## 2021-08-02 LAB — URINE CULTURE: Culture: NO GROWTH

## 2021-08-02 LAB — MAGNESIUM: Magnesium: 2.3 mg/dL (ref 1.7–2.4)

## 2021-08-02 MED ORDER — CEFADROXIL 500 MG PO CAPS: 1000.0000 mg | ORAL_CAPSULE | Freq: Two times a day (BID) | ORAL | 0 refills | Status: AC

## 2021-08-02 MED ORDER — CEFADROXIL 500 MG PO CAPS: 500.0000 mg | ORAL_CAPSULE | Freq: Every day | ORAL | Status: DC

## 2021-08-02 NOTE — Discharge Summary (Signed)
Physician Discharge Summary   [name] HTD:[id number] DOB: 02-08-1934 DOA: 07/31/2021  PCP: [name], MD  Admit date: 07/31/2021 Discharge date: 08/02/2021   Admitted From: Home Disposition:  Home Discharging physician: Dwyane Dee, MD  Recommendations for Outpatient Follow-up:  Repeat BMP  Home Health:  Equipment/Devices:   Discharge Condition: stable CODE STATUS: Full Diet recommendation:  Diet Orders (From admission, onward)     Start     Ordered   08/02/21 0000  Diet general        08/02/21 1033   07/31/21 2313  Diet regular Room service appropriate? Yes; Fluid consistency: Thin  Diet effective now       Question Answer Comment  Room service appropriate? Yes   Fluid consistency: Thin      07/31/21 2312            Hospital Course: [name] is an 87 year old female with PMH CKD 4, diastolic CHF, anemia of chronic disease, depression, diverticulosis, GERD, gout, HTN, osteoporosis, CVA who presented with complaints of dysuria.  She endorsed burning with urination and increase in urinary urgency prior to admission.  She was not on any antibiotics prior to admission.  Urinalysis was notable for large LE, 21-50 WBC, rare bacteria.  BMP was also elevated above baseline, creatinine 4 on admission. She was admitted for fluid resuscitation and UTI work-up.  Assessment and Plan: * Acute renal failure superimposed on stage 4 chronic kidney disease ([place]) - patient has history of CKD4. Baseline creat ~ 2.5, eGFR 21 - patient presents with increase in creat >0.3 mg/dL above baseline, creat increase >1.5x baseline presumed to have occurred within past 7 days PTA - creat 4 on admission -Treated with fluids during hospitalization - Creatinine improved to 2.67 at time of discharge  Acute cystitis - UA consistent with UTI given symptoms of dysuria and urgency -Treated with Rocephin during hospitalization and de-escalated to cefadroxil at discharge to complete  course -Urine culture pending at time of discharge and will contact patient if species returns resistant  Chronic diastolic CHF (congestive heart failure) ([place]) - No signs or symptoms of exacerbation - Last echo, November 2021: EF 60 to 65%.  Mild LVH, grade 1 diastolic dysfunction  GERD (gastroesophageal reflux disease) - continue pepcid   Essential hypertension - continue amlodipine     The patient's chronic medical conditions were treated accordingly per the patient's home medication regimen except as noted.  On day of discharge, patient was felt deemed stable for discharge. Patient/family member advised to call PCP or come back to ER if needed.   Principal Diagnosis: Acute renal failure superimposed on stage 4 chronic kidney disease [facility])  Discharge Diagnoses: Active Hospital Problems   Diagnosis Date Noted   Acute renal failure superimposed on stage 4 chronic kidney disease ([place]) 07/31/2021    Priority: 1.   Acute cystitis 08/01/2021    Priority: 1.   Chronic diastolic CHF (congestive heart failure) ([place]) 08/01/2021   GERD (gastroesophageal reflux disease) 05/03/2015   Essential hypertension 10/24/2005    Resolved Hospital Problems  No resolved problems to display.     Discharge Instructions     Diet general   Complete by: As directed    Increase activity slowly   Complete by: As directed       Allergies as of 08/02/2021       Reactions   Ibuprofen Other (See Comments)   PT limited intake because of chronic kidney DX  Medication List     TAKE these medications    acetaminophen 325 MG tablet Commonly known as: TYLENOL Take 325 mg by mouth daily as needed for headache (pain).   allopurinol 100 MG tablet Commonly known as: Zyloprim Take 2 tablets (200 mg total) by mouth daily. What changed:  how much to take when to take this   amLODipine 10 MG tablet Commonly known as: NORVASC TAKE 1 TABLET(10 MG) BY MOUTH AT BEDTIME What changed: See the  new instructions.   aspirin EC 81 MG tablet Take 81 mg by mouth at bedtime.   buPROPion 300 MG 24 hr tablet Commonly known as: WELLBUTRIN XL TAKE 1 TABLET(300 MG) BY MOUTH DAILY What changed: See the new instructions.   cefadroxil 500 MG capsule Commonly known as: DURICEF Take 2 capsules (1,000 mg total) by mouth 2 (two) times daily for 3 days.   famotidine 20 MG tablet Commonly known as: PEPCID TAKE 1 TABLET(20 MG) BY MOUTH TWICE DAILY What changed: See the new instructions.   furosemide 80 MG tablet Commonly known as: LASIX Take 0.5 tablets (40 mg total) by mouth daily.   ibuprofen 200 MG tablet Commonly known as: ADVIL Take 200 mg by mouth daily as needed for headache (pain).   labetalol 200 MG tablet Commonly known as: NORMODYNE Take 1 tablet (200 mg total) by mouth 2 (two) times daily.   multivitamin with minerals tablet Take 1 tablet by mouth daily.   Sensipar 30 MG tablet Generic drug: cinacalcet TAKE 1 TABLET BY MOUTH ONCE DAILY        Follow-up Information     Narendra, Nischal, MD. Schedule an appointment as soon as possible for a visit in 2 week(s).   Specialty: Internal Medicine Why: Repeat BMP at follow up Contact information: [address], [address] [place] [address] [phone]         Chandrasekhar, Mahesh A, MD .   Specialty: Cardiology Contact information: [address] [place] [address] [phone]                Allergies  Allergen Reactions   Ibuprofen Other (See Comments)    PT limited intake because of chronic kidney DX    Consultations:   Procedures:   Discharge Exam: BP (!) 146/85 (BP Location: Right Arm)   Pulse 79   Temp 98 F (36.7 C)   Resp 20   Ht 5' (1.524 m)   Wt 63.2 kg   SpO2 99%   BMI 27.22 kg/m  Physical Exam Constitutional:      Appearance: Normal appearance.  HENT:     Head: Normocephalic and atraumatic.     Mouth/Throat:     Mouth: Mucous membranes are  moist.  Eyes:     Extraocular Movements: Extraocular movements intact.  Cardiovascular:     Rate and Rhythm: Normal rate and regular rhythm.  Pulmonary:     Effort: Pulmonary effort is normal. No respiratory distress.     Breath sounds: Normal breath sounds. No wheezing.  Abdominal:     General: Bowel sounds are normal. There is no distension.     Palpations: Abdomen is soft.     Tenderness: There is no abdominal tenderness.  Musculoskeletal:        General: Normal range of motion.     Cervical back: Normal range of motion and neck supple.  Skin:    General: Skin is warm and dry.  Neurological:     General:   No focal deficit present.     Mental Status: She is alert.  Psychiatric:        Mood and Affect: Mood normal.      The results of significant diagnostics from this hospitalization (including imaging, microbiology, ancillary and laboratory) are listed below for reference.   Microbiology: Recent Results (from the past 240 hour(s))  Resp Panel by RT-PCR (Flu A&B, Covid) Anterior Nasal Swab     Status: None   Collection Time: 07/31/21  8:25 PM   Specimen: Anterior Nasal Swab  Result Value Ref Range Status   SARS Coronavirus 2 by RT PCR NEGATIVE NEGATIVE Final    Comment: (NOTE) SARS-CoV-2 target nucleic acids are NOT DETECTED.  The SARS-CoV-2 RNA is generally detectable in upper respiratory specimens during the acute phase of infection. The lowest concentration of SARS-CoV-2 viral copies this assay can detect is 138 copies/mL. A negative result does not preclude SARS-Cov-2 infection and should not be used as the sole basis for treatment or other patient management decisions. A negative result may occur with  improper specimen collection/handling, submission of specimen other than nasopharyngeal swab, presence of viral mutation(s) within the areas targeted by this assay, and inadequate number of viral copies(<138 copies/mL). A negative result must be combined with clinical  observations, patient history, and epidemiological information. The expected result is Negative.  Fact Sheet for Patients:  https://www.fda.gov/media/152166/download  Fact Sheet for Healthcare Providers:  https://www.fda.gov/media/152162/download  This test is no t yet approved or cleared by the [place] FDA and  has been authorized for detection and/or diagnosis of SARS-CoV-2 by FDA under an Emergency Use Authorization (EUA). This EUA will remain  in effect (meaning this test can be used) for the duration of the COVID-19 declaration under Section 564(b)(1) of the Act, 21 U.S.C.section 360bbb-3(b)(1), unless the authorization is terminated  or revoked sooner.       Influenza A by PCR NEGATIVE NEGATIVE Final   Influenza B by PCR NEGATIVE NEGATIVE Final    Comment: (NOTE) The Xpert Xpress SARS-CoV-2/FLU/RSV plus assay is intended as an aid in the diagnosis of influenza from Nasopharyngeal swab specimens and should not be used as a sole basis for treatment. Nasal washings and aspirates are unacceptable for Xpert Xpress SARS-CoV-2/FLU/RSV testing.  Fact Sheet for Patients: https://www.fda.gov/media/152166/download  Fact Sheet for Healthcare Providers: https://www.fda.gov/media/152162/download  This test is not yet approved or cleared by the [place] FDA and has been authorized for detection and/or diagnosis of SARS-CoV-2 by FDA under an Emergency Use Authorization (EUA). This EUA will remain in effect (meaning this test can be used) for the duration of the COVID-19 declaration under Section 564(b)(1) of the Act, 21 U.S.C. section 360bbb-3(b)(1), unless the authorization is terminated or revoked.  Performed at [facility], [address] [place], [place] [address]      Labs: BNP (last 3 results) Recent Labs    01/08/21 1529 08/01/21 0650  BNP 576.2* 347.3*   Basic Metabolic Panel: Recent Labs  Lab 07/31/21 2126 08/01/21 0650  08/02/21 0851  NA 139 141 141  K 4.7 4.1 4.5  CL 102 109 108  CO2 27 22 26  GLUCOSE 93 108* 86  BUN 70* 64* 55*  CREATININE 4.00* 3.57* 2.67*  CALCIUM 9.3 8.8* 9.2  MG  --  2.3 2.3   Liver Function Tests: Recent Labs  Lab 08/01/21 0650  AST 25  ALT 20  ALKPHOS 146*  BILITOT 0.3  PROT 6.1*  ALBUMIN 3.3*   No   results for input(s): "LIPASE", "AMYLASE" in the last 168 hours. No results for input(s): "AMMONIA" in the last 168 hours. CBC: Recent Labs  Lab 07/31/21 1951 08/01/21 0650 08/02/21 0851  WBC 4.5 4.7 3.9*  NEUTROABS 2.2 2.2 1.9  HGB 11.0* 10.2* 10.7*  HCT 34.1* 31.3* 33.1*  MCV 99.1 99.4 98.8  PLT 178 160 173   Cardiac Enzymes: No results for input(s): "CKTOTAL", "CKMB", "CKMBINDEX", "TROPONINI" in the last 168 hours. BNP: Invalid input(s): "POCBNP" CBG: No results for input(s): "GLUCAP" in the last 168 hours. D-Dimer No results for input(s): "DDIMER" in the last 72 hours. Hgb A1c No results for input(s): "HGBA1C" in the last 72 hours. Lipid Profile No results for input(s): "CHOL", "HDL", "LDLCALC", "TRIG", "CHOLHDL", "LDLDIRECT" in the last 72 hours. Thyroid function studies No results for input(s): "TSH", "T4TOTAL", "T3FREE", "THYROIDAB" in the last 72 hours.  Invalid input(s): "FREET3" Anemia work up No results for input(s): "VITAMINB12", "FOLATE", "FERRITIN", "TIBC", "IRON", "RETICCTPCT" in the last 72 hours. Urinalysis    Component Value Date/Time   COLORURINE YELLOW 07/31/2021 1957   APPEARANCEUR CLEAR 07/31/2021 1957   LABSPEC 1.011 07/31/2021 1957   PHURINE 5.0 07/31/2021 1957   GLUCOSEU NEGATIVE 07/31/2021 1957   HGBUR NEGATIVE 07/31/2021 1957   BILIRUBINUR NEGATIVE 07/31/2021 1957   KETONESUR NEGATIVE 07/31/2021 1957   PROTEINUR NEGATIVE 07/31/2021 1957   UROBILINOGEN 0.2 06/02/2013 0417   NITRITE NEGATIVE 07/31/2021 1957   LEUKOCYTESUR LARGE (A) 07/31/2021 1957   Sepsis Labs Recent Labs  Lab 07/31/21 1951 08/01/21 0650  08/02/21 0851  WBC 4.5 4.7 3.9*   Microbiology Recent Results (from the past 240 hour(s))  Resp Panel by RT-PCR (Flu A&B, Covid) Anterior Nasal Swab     Status: None   Collection Time: 07/31/21  8:25 PM   Specimen: Anterior Nasal Swab  Result Value Ref Range Status   SARS Coronavirus 2 by RT PCR NEGATIVE NEGATIVE Final    Comment: (NOTE) SARS-CoV-2 target nucleic acids are NOT DETECTED.  The SARS-CoV-2 RNA is generally detectable in upper respiratory specimens during the acute phase of infection. The lowest concentration of SARS-CoV-2 viral copies this assay can detect is 138 copies/mL. A negative result does not preclude SARS-Cov-2 infection and should not be used as the sole basis for treatment or other patient management decisions. A negative result may occur with  improper specimen collection/handling, submission of specimen other than nasopharyngeal swab, presence of viral mutation(s) within the areas targeted by this assay, and inadequate number of viral copies(<138 copies/mL). A negative result must be combined with clinical observations, patient history, and epidemiological information. The expected result is Negative.  Fact Sheet for Patients:  https://www.fda.gov/media/152166/download  Fact Sheet for Healthcare Providers:  https://www.fda.gov/media/152162/download  This test is no t yet approved or cleared by the [place] FDA and  has been authorized for detection and/or diagnosis of SARS-CoV-2 by FDA under an Emergency Use Authorization (EUA). This EUA will remain  in effect (meaning this test can be used) for the duration of the COVID-19 declaration under Section 564(b)(1) of the Act, 21 U.S.C.section 360bbb-3(b)(1), unless the authorization is terminated  or revoked sooner.       Influenza A by PCR NEGATIVE NEGATIVE Final   Influenza B by PCR NEGATIVE NEGATIVE Final    Comment: (NOTE) The Xpert Xpress SARS-CoV-2/FLU/RSV plus assay is intended as an  aid in the diagnosis of influenza from Nasopharyngeal swab specimens and should not be used as a sole basis for treatment. Nasal washings and aspirates   are unacceptable for Xpert Xpress SARS-CoV-2/FLU/RSV testing.  Fact Sheet for Patients: https://www.fda.gov/media/152166/download  Fact Sheet for Healthcare Providers: https://www.fda.gov/media/152162/download  This test is not yet approved or cleared by the [place] FDA and has been authorized for detection and/or diagnosis of SARS-CoV-2 by FDA under an Emergency Use Authorization (EUA). This EUA will remain in effect (meaning this test can be used) for the duration of the COVID-19 declaration under Section 564(b)(1) of the Act, 21 U.S.C. section 360bbb-3(b)(1), unless the authorization is terminated or revoked.  Performed at [facility], [address] [place], [place] [address]     Procedures/Studies: DG Chest 2 View  Result Date: 07/31/2021 CLINICAL DATA:  Dyspnea on exertion. EXAM: CHEST - 2 VIEW COMPARISON:  Chest x-ray 11/05/2019 FINDINGS: Aorta is tortuous. Heart is enlarged, unchanged. There is central pulmonary vascular congestion. There is no focal lung consolidation, pleural effusion or pneumothorax. No acute fractures are identified. IMPRESSION: Cardiomegaly with central pulmonary vascular congestion. Electronically Signed   By: Amy  Guttmann M.D.   On: 07/31/2021 20:28     Time coordinating discharge: Over 30 minutes    David Girguis, MD  [facility] 08/02/2021, 3:13 PM 

## 2021-08-02 NOTE — Progress Notes (Signed)
  Transition of Care [facility]) Screening Note   Patient Details  Name: [name] Date of Birth: August 25, 1934   Transition of Care [facility]) CM/SW Contact:    Vassie Moselle, LCSW Phone Number: 08/02/2021, 10:35 AM    Transition of Care Department [facility]) has reviewed patient and no TOC needs have been identified at this time. We will continue to monitor patient advancement through interdisciplinary progression rounds. If new patient transition needs arise, please place a TOC consult.

## 2021-08-02 NOTE — Progress Notes (Signed)
Mobility Specialist - Progress Note     08/02/21 1057  Mobility  Activity Ambulated with assistance in hallway  Level of Assistance Standby assist, set-up cues, supervision of patient - no hands on  Assistive Device None  Distance Ambulated (ft) 380 ft  Activity Response Tolerated well  $Mobility charge 1 Mobility    Pt was agreeable to mobilize. Pt was able to get out of chair independently. Pt was able to ambulate 350f in the hallway. Pt had a cross step and some side steps while ambulating. Pt also wobbles a bit but was self-correcting. Pt upon returning to room was left in chair with all necessities in reach.  BFerd HibbsMobility Specialist

## 2021-08-07 ENCOUNTER — Encounter: Payer: Self-pay | Admitting: Internal Medicine

## 2021-08-08 ENCOUNTER — Other Ambulatory Visit: Payer: Self-pay

## 2021-08-08 ENCOUNTER — Ambulatory Visit (INDEPENDENT_AMBULATORY_CARE_PROVIDER_SITE_OTHER): Payer: 59 | Admitting: Internal Medicine

## 2021-08-08 ENCOUNTER — Encounter: Payer: Self-pay | Admitting: Internal Medicine

## 2021-08-08 VITALS — BP 121/69 | HR 70 | Temp 97.6°F | Ht 62.0 in | Wt 127.5 lb

## 2021-08-08 DIAGNOSIS — I5032 Chronic diastolic (congestive) heart failure: Secondary | ICD-10-CM | POA: Diagnosis not present

## 2021-08-08 DIAGNOSIS — Z0000 Encounter for general adult medical examination without abnormal findings: Secondary | ICD-10-CM

## 2021-08-08 DIAGNOSIS — D509 Iron deficiency anemia, unspecified: Secondary | ICD-10-CM

## 2021-08-08 DIAGNOSIS — M1A30X0 Chronic gout due to renal impairment, unspecified site, without tophus (tophi): Secondary | ICD-10-CM

## 2021-08-08 DIAGNOSIS — I130 Hypertensive heart and chronic kidney disease with heart failure and stage 1 through stage 4 chronic kidney disease, or unspecified chronic kidney disease: Secondary | ICD-10-CM | POA: Diagnosis not present

## 2021-08-08 DIAGNOSIS — N184 Chronic kidney disease, stage 4 (severe): Secondary | ICD-10-CM | POA: Diagnosis not present

## 2021-08-08 DIAGNOSIS — N179 Acute kidney failure, unspecified: Secondary | ICD-10-CM | POA: Diagnosis not present

## 2021-08-08 DIAGNOSIS — R4189 Other symptoms and signs involving cognitive functions and awareness: Secondary | ICD-10-CM

## 2021-08-08 DIAGNOSIS — N3000 Acute cystitis without hematuria: Secondary | ICD-10-CM

## 2021-08-08 DIAGNOSIS — Z87891 Personal history of nicotine dependence: Secondary | ICD-10-CM

## 2021-08-08 DIAGNOSIS — D631 Anemia in chronic kidney disease: Secondary | ICD-10-CM

## 2021-08-08 DIAGNOSIS — I10 Essential (primary) hypertension: Secondary | ICD-10-CM

## 2021-08-08 DIAGNOSIS — F32A Depression, unspecified: Secondary | ICD-10-CM

## 2021-08-08 NOTE — Assessment & Plan Note (Addendum)
-  Patient has chronic depression that is progressing.  -Patient reports mood has been worse since her recent discharge and feels stressed that her family is around all the time.  -Her PHQ-9 today was 13, up from 6 at last check.  Plan: -Follow up on mood at next visit in 4 weeks -Continue current regimen for now

## 2021-08-08 NOTE — Assessment & Plan Note (Signed)
-  Patient has chronic stable anemia. Patient denies fatigue or SOB and reports feeling well overall. Her last Hgb was 10.7 from one week ago.  -No further work-up at this time

## 2021-08-08 NOTE — Assessment & Plan Note (Signed)
-  Patient presented to the ED approx one week ago with confusion and suprapubic abdominal pain and was admitted for worsening kidney function, fluid resuscitation, and acute cystitis.  -Patient has finished course of cefadroxil post-discharge. Patient has not had any acute complaints of suprapubic discomfort, urinary frequency, or dysuria.  -Urine culture was negative for growth. No further work-up needed at this time.

## 2021-08-08 NOTE — Assessment & Plan Note (Addendum)
-  Patient had presented with acute confusion with her recent episode of acute cystitis. -Patient is accompanied by granddaughter today, who relates that patient has not been at her cognitive baseline for a while. Patient says that she didn't feel like herself at time of hospital admission, but feels better now. Patient reports that family members have been helping her at home since her discharge, but she is still able to perform her ADL's independently.  -MOCA in office today was 12/30. -This is an acute cognitive change from previous office visits and patient has no prior history of dementia. This acute change could possibly be related to her recent hospitalization, or could represent an exacerbation of age-related decline. We will plan to follow closely to monitor her cognitive status and see if it resolves or if needs further work-up. -We discussed with patient that if there is no improvement at next follow-up in 4 weeks, we may refer to Neurology for further evaluation. We also discussed that if her cognition does not improve over time she may need an aide if she needs more assistance with ADLs.  Plan: -Follow up in 4 weeks with repeat MOCA

## 2021-08-08 NOTE — Patient Instructions (Addendum)
Thank you for seeing [name] today [name]! Your blood pressure today (121/69) was great, keep up the good work.  Labwork We are looking at labs for your kidney function today and will call you with the results.   2. No changes to your medications today. We would like to see you in 4 weeks to see if your memory is improving after your hospitalization.   Please call our office if you have any concerns in the meantime.

## 2021-08-08 NOTE — Assessment & Plan Note (Signed)
Patient declines Tdap and Shingrix today. Will follow up again at next visit.

## 2021-08-08 NOTE — Progress Notes (Signed)
Subjective:   Patient ID: [name] female   DOB: 14-Sep-1934 87 y.o.   MRN: [id number]  HPI: [name] is a 87 y.o. with a pmhx significant for HTN, diastolic CHF, anemia of chronic disease, depression who presents to [facility] for hospital follow-up of acute cystitis. Please see Plan for individualized problem-based charting.   Patient Active Problem List   Diagnosis Date Noted   Cognitive changes 08/08/2021   Acute cystitis 08/01/2021   Chronic diastolic CHF (congestive heart failure) ([place]) 08/01/2021   Acute renal failure superimposed on stage 4 chronic kidney disease ([place]) 07/31/2021   Nonrheumatic tricuspid valve regurgitation 12/09/2019   Nonrheumatic mitral valve regurgitation 10/30/2019   Benign positional vertigo 09/21/2016   Preventative health care 09/11/2016   Vaginal prolapse 07/19/2016   Insomnia 05/03/2015   GERD (gastroesophageal reflux disease) 05/03/2015   Meningioma ([place]) 03/01/2015   Tinnitus 01/24/2015   History of CVA (cerebrovascular accident) 01/24/2015   Gout 03/03/2014   Hearing loss 11/03/2013   Osteoporosis 03/06/2012   Depression 08/30/2009   Anemia associated with stage 4 chronic renal failure ([place]) 06/01/2009   Disease of pericardium 06/01/2009   CKD (chronic kidney disease) stage 4, GFR 15-29 ml/min ([place]) 06/01/2009   CATARACTS 10/24/2005   Essential hypertension 10/24/2005   ANGIODYSPLASIA, COLON 05/09/2005     Current Outpatient Medications  Medication Sig Dispense Refill   acetaminophen (TYLENOL) 325 MG tablet Take 325 mg by mouth daily as needed for headache (pain).     allopurinol (ZYLOPRIM) 100 MG tablet Take 2 tablets (200 mg total) by mouth daily. (Patient taking differently: Take 100 mg by mouth 2 (two) times daily.) 30 tablet 2   amLODipine (NORVASC) 10 MG tablet TAKE 1 TABLET(10 MG) BY MOUTH AT BEDTIME (Patient taking differently: Take 10 mg by mouth daily.) 90 tablet 1   aspirin EC 81 MG tablet Take 81 mg by mouth at  bedtime.  (Patient not taking: Reported on 08/01/2021)     buPROPion (WELLBUTRIN XL) 300 MG 24 hr tablet TAKE 1 TABLET(300 MG) BY MOUTH DAILY (Patient taking differently: Take 300 mg by mouth daily.) 90 tablet 1   famotidine (PEPCID) 20 MG tablet TAKE 1 TABLET(20 MG) BY MOUTH TWICE DAILY (Patient taking differently: Take 20 mg by mouth 2 (two) times daily.) 90 tablet 1   furosemide (LASIX) 80 MG tablet Take 0.5 tablets (40 mg total) by mouth daily. 180 tablet 0   ibuprofen (ADVIL) 200 MG tablet Take 200 mg by mouth daily as needed for headache (pain).     labetalol (NORMODYNE) 200 MG tablet Take 1 tablet (200 mg total) by mouth 2 (two) times daily. 180 tablet 3   Multiple Vitamins-Minerals (MULTIVITAMIN WITH MINERALS) tablet Take 1 tablet by mouth daily.      SENSIPAR 30 MG tablet TAKE 1 TABLET BY MOUTH ONCE DAILY (Patient not taking: Reported on 08/01/2021) 90 tablet 0   No current facility-administered medications for this visit.     Review of Systems: Review of systems is negative other than what is noted in individual problem-based charting.    Objective:   Physical Exam: Vitals:   08/08/21 0905  BP: 121/69  Pulse: 70  Temp: 97.6 F (36.4 C)  TempSrc: Oral  SpO2: 96%  Weight: 127 lb 8 oz (57.8 kg)  Height: '5\' 2"'$  (1.575 m)   Physical Exam Constitutional:      General: She is not in acute distress. HENT:     Head: Normocephalic  and atraumatic.  Cardiovascular:     Rate and Rhythm: Normal rate and regular rhythm.     Heart sounds: Murmur heard.     No friction rub. No gallop.  Pulmonary:     Effort: Pulmonary effort is normal. No respiratory distress.     Breath sounds: Normal breath sounds. No wheezing.  Abdominal:     General: Abdomen is flat. There is no distension.     Palpations: Abdomen is soft.     Tenderness: There is no abdominal tenderness.     Comments: No suprapubic tenderness  Skin:    General: Skin is warm and dry.  Neurological:     Mental Status: She is  disoriented.      Assessment & Plan:   Acute cystitis -Patient presented to the ED approx one week ago with confusion and suprapubic abdominal pain and was admitted for worsening kidney function, fluid resuscitation, and acute cystitis.  -Patient has finished course of cefadroxil post-discharge. Patient has not had any acute complaints of suprapubic discomfort, urinary frequency, or dysuria.  -Urine culture was negative for growth. No further work-up needed at this time.   Acute renal failure superimposed on stage 4 chronic kidney disease ([place]) At time of recent hospital admission, patient's creatnine had elevated to 4 (baseline 2.5-3). Patient received fluids during hospitalization and creatnine had decreased to 2.67 by discharge. Plan: -Repeat BMP today  Essential hypertension -Patient has chronic well-controlled hypertension managed with Amlodipine '10mg'$ , Labetalol '200mg'$  twice daily and Lasix '80mg'$ . Patient reports good adherence to regimen and no side effects on this regimen. Patient denies dizziness or recent falls. -BP in office today was 121/69. -Patient is at goal BP, no changes or further work-up today.  Plan: -Continue Amlodipine '10mg'$ , Labetalol '200mg'$  twice daily and Lasix '80mg'$   Cognitive changes -Patient had presented with acute confusion with her recent episode of acute cystitis. -Patient is accompanied by granddaughter today, who relates that patient has not been at her cognitive baseline for a while. Patient says that she didn't feel like herself at time of hospital admission, but feels better now. Patient reports that family members have been helping her at home since her discharge, but she is still able to perform her ADL's independently.  -MOCA in office today was 12/30. -This is an acute cognitive change from previous office visits and patient has no prior history of dementia. This acute change could possibly be related to her recent hospitalization, or could represent an  exacerbation of age-related decline. We will plan to follow closely to monitor her cognitive status and see if it resolves or if needs further work-up. -We discussed with patient that if there is no improvement at next follow-up in 4 weeks, we may refer to Neurology for further evaluation. We also discussed that if her cognition does not improve over time she may need an aide if she needs more assistance with ADLs.  Plan: -Follow up in 4 weeks with repeat MOCA   Anemia associated with stage 4 chronic renal failure ([place]) -Patient has chronic stable anemia. Patient denies fatigue or SOB and reports feeling well overall. Her last Hgb was 10.7 from one week ago.  -No further work-up at this time  Depression -Patient has chronic depression that is progressing.  -Patient reports mood has been worse since her recent discharge and feels stressed that her family is around all the time.  -Her PHQ-9 today was 13, up from 6 at last check.  Plan: -Follow up on mood at  next visit in 4 weeks -Continue current regimen for now  Preventative health care Patient declines Tdap and Shingrix today. Will follow up again at next visit.

## 2021-08-08 NOTE — Assessment & Plan Note (Addendum)
-  Patient has chronic well-controlled hypertension managed with Amlodipine '10mg'$ , Labetalol '200mg'$  twice daily and Lasix '80mg'$ . Patient reports good adherence to regimen and no side effects on this regimen. Patient denies dizziness or recent falls. -BP in office today was 121/69. -Patient is at goal BP, no changes or further work-up today.  Plan: -Continue Amlodipine '10mg'$ , Labetalol '200mg'$  twice daily and Lasix '80mg'$

## 2021-08-08 NOTE — Assessment & Plan Note (Signed)
At time of recent hospital admission, patient's creatnine had elevated to 4 (baseline 2.5-3). Patient received fluids during hospitalization and creatnine had decreased to 2.67 by discharge. Plan: -Repeat BMP today

## 2021-08-09 LAB — BMP8+ANION GAP
Anion Gap: 16 mmol/L (ref 10.0–18.0)
BUN/Creatinine Ratio: 17 (ref 12–28)
BUN: 50 mg/dL — ABNORMAL HIGH (ref 8–27)
CO2: 23 mmol/L (ref 20–29)
Calcium: 9.5 mg/dL (ref 8.7–10.3)
Chloride: 104 mmol/L (ref 96–106)
Creatinine, Ser: 2.95 mg/dL — ABNORMAL HIGH (ref 0.57–1.00)
Glucose: 85 mg/dL (ref 70–99)
Potassium: 5.3 mmol/L — ABNORMAL HIGH (ref 3.5–5.2)
Sodium: 143 mmol/L (ref 134–144)
eGFR: 15 mL/min/{1.73_m2} — ABNORMAL LOW (ref >59)

## 2021-08-09 NOTE — Assessment & Plan Note (Signed)
-   This problem is chronic and stable -Patient denies any recent gout flares and is compliant with all normal 200 mg daily  -No further workup at this time

## 2021-08-09 NOTE — Progress Notes (Signed)
Attestation for Student Documentation:  I personally was present and performed or re-performed the history, physical exam and medical decision-making activities of this service and have verified that the service and findings are accurately documented in the student's note.  [name], MD 08/09/2021, 2:26 PM

## 2021-08-09 NOTE — Assessment & Plan Note (Signed)
-   This problem is chronic and stable -Patient no evidence of exacerbation at this time.  States that she has no shortness of breath and on exam her lungs are clear to auscultation and she has no lower extremity edema currently -Will continue with Lasix 40 mg daily.  Patient may benefit from addition of an SGLT2 inhibitor but given patient's cognitive deficits noted on MoCA today I am concerned that it may be difficult to add an additional medication at this time. -No further workup for now

## 2021-08-31 ENCOUNTER — Other Ambulatory Visit: Payer: Self-pay

## 2021-08-31 DIAGNOSIS — I10 Essential (primary) hypertension: Secondary | ICD-10-CM

## 2021-08-31 MED ORDER — AMLODIPINE BESYLATE 10 MG PO TABS: ORAL_TABLET | ORAL | 3 refills | Status: DC

## 2021-08-31 MED ORDER — AMLODIPINE BESYLATE 10 MG PO TABS: 10.0000 mg | ORAL_TABLET | Freq: Every day | ORAL | 3 refills | Status: DC

## 2021-09-10 ENCOUNTER — Other Ambulatory Visit: Payer: Self-pay | Admitting: Internal Medicine

## 2021-09-10 DIAGNOSIS — K219 Gastro-esophageal reflux disease without esophagitis: Secondary | ICD-10-CM

## 2021-09-26 ENCOUNTER — Ambulatory Visit (INDEPENDENT_AMBULATORY_CARE_PROVIDER_SITE_OTHER): Payer: 59 | Admitting: Internal Medicine

## 2021-09-26 VITALS — BP 114/61 | HR 57 | Temp 97.7°F | Ht 62.0 in | Wt 124.5 lb

## 2021-09-26 DIAGNOSIS — R4189 Other symptoms and signs involving cognitive functions and awareness: Secondary | ICD-10-CM

## 2021-09-26 DIAGNOSIS — D631 Anemia in chronic kidney disease: Secondary | ICD-10-CM

## 2021-09-26 DIAGNOSIS — I130 Hypertensive heart and chronic kidney disease with heart failure and stage 1 through stage 4 chronic kidney disease, or unspecified chronic kidney disease: Secondary | ICD-10-CM | POA: Diagnosis not present

## 2021-09-26 DIAGNOSIS — R634 Abnormal weight loss: Secondary | ICD-10-CM

## 2021-09-26 DIAGNOSIS — Z0000 Encounter for general adult medical examination without abnormal findings: Secondary | ICD-10-CM

## 2021-09-26 DIAGNOSIS — Z87891 Personal history of nicotine dependence: Secondary | ICD-10-CM

## 2021-09-26 DIAGNOSIS — N179 Acute kidney failure, unspecified: Secondary | ICD-10-CM | POA: Diagnosis not present

## 2021-09-26 DIAGNOSIS — N184 Chronic kidney disease, stage 4 (severe): Secondary | ICD-10-CM | POA: Diagnosis not present

## 2021-09-26 DIAGNOSIS — F32A Depression, unspecified: Secondary | ICD-10-CM

## 2021-09-26 DIAGNOSIS — Z23 Encounter for immunization: Secondary | ICD-10-CM

## 2021-09-26 DIAGNOSIS — I10 Essential (primary) hypertension: Secondary | ICD-10-CM

## 2021-09-26 DIAGNOSIS — I5032 Chronic diastolic (congestive) heart failure: Secondary | ICD-10-CM | POA: Diagnosis not present

## 2021-09-26 NOTE — Assessment & Plan Note (Signed)
Patient had a recent history of confusion with acute cystitis two months ago. Patient's granddaughter states that she feels patient's cognition has not improved since the last visit. Patient reports that she feels her memory is not good but she drives, cleans, and cooks independently and her family also helps often with meals. She denies any recent falls. MOCA today was 11/30 compared to 12/30 from six weeks ago.  A&P: Patient's cognition appears to be unchanged from last visit, and at this point not likely related to her recent cystitis. It is reassuring that she continues to function well independently with ADLs and has strong family support. Will plan to refer to Neurology for further work-up given recent decline in cognition.  Plan: -Neurology referral

## 2021-09-26 NOTE — Assessment & Plan Note (Signed)
Patient received flu shot today 

## 2021-09-26 NOTE — Assessment & Plan Note (Signed)
Patient had a recent pattern of increased unintentional weight loss, from 141 lb at the beginning of this year to 124.5 lb today. She reports that she has had poor appetite over the last 2-3 months, but granddaughter thinks it's been about one year. Patient says that sometimes she feels hungry for something, but she does not feel like eating when the food is in front of her. Granddaughter states that patient tends to eat smaller snacks throughout the day rather than large meals and always has food available to her. Patient denies night sweats, cough, fevers, chills, nausea/vomiting, melena, or hematochezia.  A&P: At this time no clear cause for appetite loss, possible that could be result of worsening kidney function secondary to CKD. She had a slow weight loss over the last several years starting at the beginning of 2020 at 162lb, but recent trend this year has been more concerning. Patient has strong family support and is able to cook for herself as well, so unlikely to be an issue of access to meals. We had a discussion about how aggressively they would want to pursue further workup for possible malignancy and patient expressed that she is more interested in conservative management at this time. Will plan to check labs today, and will discuss potentially adding mirtazepine next visit.  Plan: -CBC, CMP, TSH -Follow up weight next visit

## 2021-09-26 NOTE — Assessment & Plan Note (Addendum)
Patient reports that her mood has been good recently and states she is appreciative of her family's support. Patient endorses poor appetite over the last year, although unclear if it is from her depression or secondary to other chronic conditions. PHQ-9 today was 12, compared to 13 from last visit. Today we discussed potentially adding or changing her regimen to mirtazepine to address both mood and appetite, granddaughter states she would like to look into it and discuss next visit.  Plan: -Follow up on mirtazepine next visit -Continue Wellbutrin '300mg'$ 

## 2021-09-26 NOTE — Assessment & Plan Note (Addendum)
Patient's last BMP one month ago showed creatinine at 2.95 (baseline 2.7-3) with elevated potassium at 5.3, concerning for worsening kidney function. Patient also endorses decreased appetite over the last year, which could be secondary to worsening CKD.  Plan: -BMP today

## 2021-09-26 NOTE — Assessment & Plan Note (Addendum)
Patient has a history of anemia, last hgb=10.7 from two months ago. Patient reports feeling well and denies fatigue.  Plan: -CBC

## 2021-09-26 NOTE — Patient Instructions (Addendum)
Thank you for seeing [name] today [name]! Your BP was good today 114/61.   Referral We sent a referral to a Neurologist, and you should get a call from their office to schedule an appointment.   2.  Labwork We are looking at your kidney, thyroid function and blood counts today. We will call you with the results.   You got your flu shot in office today.

## 2021-09-26 NOTE — Assessment & Plan Note (Signed)
Patient has history of chronic hypertension managed with Amlodipine '10mg'$ , labetalol '200mg'$  BID, and lasix '80mg'$ . Patient denies dizziness or lightheadedness, denies recent falls. BP today was well-controlled at 114/61.  Plan: -BMP today -Continue  Amlodipine '10mg'$ , labetalol '200mg'$  BID, and lasix '80mg'$ 

## 2021-09-26 NOTE — Progress Notes (Signed)
Subjective:   [name] ID: [name] female   DOB: September 02, 1934 87 y.o.   MRN: [id number]  HPI: [name] is a 87 y.o. with past medical history significant for CKD stage 4 and MDD who presents for routine follow-up. Please see Plan for individualized problem-based charting.   [name] Active Problem List   Diagnosis Date Noted   Weight loss 09/26/2021   Cognitive changes 08/08/2021   Acute cystitis 08/01/2021   Chronic diastolic CHF (congestive heart failure) ([place]) 08/01/2021   Acute renal failure superimposed on stage 4 chronic kidney disease ([place]) 07/31/2021   Nonrheumatic tricuspid valve regurgitation 12/09/2019   Nonrheumatic mitral valve regurgitation 10/30/2019   Benign positional vertigo 09/21/2016   Preventative health care 09/11/2016   Vaginal prolapse 07/19/2016   Insomnia 05/03/2015   GERD (gastroesophageal reflux disease) 05/03/2015   Meningioma ([place]) 03/01/2015   Tinnitus 01/24/2015   History of CVA (cerebrovascular accident) 01/24/2015   Gout 03/03/2014   Hearing loss 11/03/2013   Osteoporosis 03/06/2012   Depression 08/30/2009   Anemia associated with stage 4 chronic renal failure ([place]) 06/01/2009   Disease of pericardium 06/01/2009   CKD (chronic kidney disease) stage 4, GFR 15-29 ml/min ([place]) 06/01/2009   CATARACTS 10/24/2005   Essential hypertension 10/24/2005   ANGIODYSPLASIA, COLON 05/09/2005     Current Outpatient Medications  Medication Sig Dispense Refill   acetaminophen (TYLENOL) 325 MG tablet Take 325 mg by mouth daily as needed for headache (pain).     allopurinol (ZYLOPRIM) 100 MG tablet Take 2 tablets (200 mg total) by mouth daily. ([name] taking differently: Take 100 mg by mouth 2 (two) times daily.) 30 tablet 2   amLODipine (NORVASC) 10 MG tablet TAKE 1 TABLET(10 MG) BY MOUTH AT BEDTIME Strength: 10 mg 90 tablet 3   aspirin EC 81 MG tablet Take 81 mg by mouth at bedtime.  ([name] not taking: Reported on 08/01/2021)      buPROPion (WELLBUTRIN XL) 300 MG 24 hr tablet TAKE 1 TABLET(300 MG) BY MOUTH DAILY ([name] taking differently: Take 300 mg by mouth daily.) 90 tablet 1   famotidine (PEPCID) 20 MG tablet TAKE 1 TABLET(20 MG) BY MOUTH TWICE DAILY 90 tablet 1   furosemide (LASIX) 80 MG tablet Take 0.5 tablets (40 mg total) by mouth daily. 180 tablet 0   ibuprofen (ADVIL) 200 MG tablet Take 200 mg by mouth daily as needed for headache (pain).     labetalol (NORMODYNE) 200 MG tablet Take 1 tablet (200 mg total) by mouth 2 (two) times daily. 180 tablet 3   Multiple Vitamins-Minerals (MULTIVITAMIN WITH MINERALS) tablet Take 1 tablet by mouth daily.      SENSIPAR 30 MG tablet TAKE 1 TABLET BY MOUTH ONCE DAILY ([name] not taking: Reported on 08/01/2021) 90 tablet 0   No current facility-administered medications for this visit.     Review of Systems: Review of systems is negative other than what is noted in individual problem-based charting.    Objective:   Physical Exam: Vitals:   09/26/21 1015  BP: 114/61  Pulse: (!) 57  Temp: 97.7 F (36.5 C)  TempSrc: Oral  SpO2: 100%  Weight: 124 lb 8 oz (56.5 kg)  Height: '5\' 2"'$  (1.575 m)   Physical Exam Vitals reviewed.  Constitutional:      General: She is not in acute distress. Cardiovascular:     Rate and Rhythm: Normal rate and regular rhythm.     Heart sounds: Normal heart sounds. No  murmur heard. Pulmonary:     Effort: Pulmonary effort is normal. No respiratory distress.     Breath sounds: Normal breath sounds. No wheezing.  Abdominal:     General: Abdomen is flat. Bowel sounds are normal. There is no distension.     Palpations: Abdomen is soft.     Tenderness: There is no abdominal tenderness.  Skin:    General: Skin is warm and dry.  Neurological:     Mental Status: She is alert.     Comments: Oriented to self and place, not to time      Assessment & Plan:   Cognitive changes [name] had a recent history of confusion with acute cystitis  two months ago. [name]'s granddaughter states that she feels [name]'s cognition has not improved since the last visit. [name] reports that she feels her memory is not good but she drives, cleans, and cooks independently and her family also helps often with meals. She denies any recent falls. MOCA today was 11/30 compared to 12/30 from six weeks ago.  A&P: [name]'s cognition appears to be unchanged from last visit, and at this point not likely related to her recent cystitis. It is reassuring that she continues to function well independently with ADLs and has strong family support. Will plan to refer to Neurology for further work-up given recent decline in cognition.  Plan: -Neurology referral  Depression [name] reports that her mood has been good recently and states she is appreciative of her family's support. [name] endorses poor appetite over the last year, although unclear if it is from her depression or secondary to other chronic conditions. PHQ-9 today was 12, compared to 13 from last visit. Today we discussed potentially adding or changing her regimen to mirtazepine to address both mood and appetite, granddaughter states she would like to look into it and discuss next visit.  Plan: -Follow up on mirtazepine next visit -Continue Wellbutrin '300mg'$   Weight loss [name] had a recent pattern of increased unintentional weight loss, from 141 lb at the beginning of this year to 124.5 lb today. She reports that she has had poor appetite over the last 2-3 months, but granddaughter thinks it's been about one year. [name] says that sometimes she feels hungry for something, but she does not feel like eating when the food is in front of her. Granddaughter states that [name] tends to eat smaller snacks throughout the day rather than large meals and always has food available to her. [name] denies night sweats, cough, fevers, chills, nausea/vomiting, melena, or hematochezia.  A&P: At this time no clear  cause for appetite loss, possible that could be result of worsening kidney function secondary to CKD. She had a slow weight loss over the last several years starting at the beginning of 2020 at 162lb, but recent trend this year has been more concerning. [name] has strong family support and is able to cook for herself as well, so unlikely to be an issue of access to meals. We had a discussion about how aggressively they would want to pursue further workup for possible malignancy and [name] expressed that she is more interested in conservative management at this time. Will plan to check labs today, and will discuss potentially adding mirtazepine next visit.  Plan: -CBC, CMP, TSH -Follow up weight next visit     Anemia associated with stage 4 chronic renal failure ([place]) [name] has a history of anemia, last hgb=10.7 from two months ago. [name] reports feeling well and denies fatigue.  Plan: -CBC  Essential hypertension [name] has history of chronic hypertension managed with Amlodipine '10mg'$ , labetalol '200mg'$  BID, and lasix '80mg'$ . [name] denies dizziness or lightheadedness, denies recent falls. BP today was well-controlled at 114/61.  Plan: -BMP today -Continue  Amlodipine '10mg'$ , labetalol '200mg'$  BID, and lasix '80mg'$   CKD (chronic kidney disease) stage 4, GFR 15-29 ml/min ([facility]) [name]'s last BMP one month ago showed creatinine at 2.95 (baseline 2.7-3) with elevated potassium at 5.3, concerning for worsening kidney function. [name] also endorses decreased appetite over the last year, which could be secondary to worsening CKD.  Plan: -BMP today  Preventative health care [name] received flu shot today.

## 2021-09-27 LAB — CBC WITH DIFFERENTIAL/PLATELET
Basophils Absolute: 0 10*3/uL (ref 0.0–0.2)
Basos: 1 %
EOS (ABSOLUTE): 0.3 10*3/uL (ref 0.0–0.4)
Eos: 6 %
Hematocrit: 30.3 % — ABNORMAL LOW (ref 34.0–46.6)
Hemoglobin: 10.3 g/dL — ABNORMAL LOW (ref 11.1–15.9)
Immature Grans (Abs): 0 10*3/uL (ref 0.0–0.1)
Immature Granulocytes: 0 %
Lymphocytes Absolute: 1.2 10*3/uL (ref 0.7–3.1)
Lymphs: 28 %
MCH: 31.6 pg (ref 26.6–33.0)
MCHC: 34 g/dL (ref 31.5–35.7)
MCV: 93 fL (ref 79–97)
Monocytes Absolute: 0.4 10*3/uL (ref 0.1–0.9)
Monocytes: 9 %
Neutrophils Absolute: 2.4 10*3/uL (ref 1.4–7.0)
Neutrophils: 56 %
Platelets: 183 10*3/uL (ref 150–450)
RBC: 3.26 x10E6/uL — ABNORMAL LOW (ref 3.77–5.28)
RDW: 14.4 % (ref 11.7–15.4)
WBC: 4.2 10*3/uL (ref 3.4–10.8)

## 2021-09-27 LAB — CMP14 + ANION GAP
ALT: 15 IU/L (ref 0–32)
AST: 20 IU/L (ref 0–40)
Albumin/Globulin Ratio: 2 (ref 1.2–2.2)
Albumin: 4.5 g/dL (ref 3.7–4.7)
Alkaline Phosphatase: 123 IU/L — ABNORMAL HIGH (ref 44–121)
Anion Gap: 18 mmol/L (ref 10.0–18.0)
BUN/Creatinine Ratio: 14 (ref 12–28)
BUN: 46 mg/dL — ABNORMAL HIGH (ref 8–27)
Bilirubin Total: 0.4 mg/dL (ref 0.0–1.2)
CO2: 21 mmol/L (ref 20–29)
Calcium: 9.8 mg/dL (ref 8.7–10.3)
Chloride: 102 mmol/L (ref 96–106)
Creatinine, Ser: 3.3 mg/dL — ABNORMAL HIGH (ref 0.57–1.00)
Globulin, Total: 2.3 g/dL (ref 1.5–4.5)
Glucose: 88 mg/dL (ref 70–99)
Potassium: 5 mmol/L (ref 3.5–5.2)
Sodium: 141 mmol/L (ref 134–144)
Total Protein: 6.8 g/dL (ref 6.0–8.5)
eGFR: 13 mL/min/{1.73_m2} — ABNORMAL LOW (ref >59)

## 2021-09-27 LAB — TSH: TSH: 4.45 u[IU]/mL (ref 0.450–4.500)

## 2021-09-27 NOTE — Progress Notes (Signed)
Attestation for Student Documentation:  I personally was present and performed or re-performed the history, physical exam and medical decision-making activities of this service and have verified that the service and findings are accurately documented in the student's note.  [name], MD 09/27/2021, 2:30 PM

## 2021-09-27 NOTE — Assessment & Plan Note (Signed)
-   This problem is chronic and stable - Patient has trace b/l LE pitting edema on exam - Will c/w lasix 40 mg daily - She is not a candidate for an SGLT-2 inhibitor given her decreased GFR -No further w/u at this time

## 2021-10-31 ENCOUNTER — Encounter: Payer: Self-pay | Admitting: Psychiatry

## 2021-10-31 ENCOUNTER — Ambulatory Visit (INDEPENDENT_AMBULATORY_CARE_PROVIDER_SITE_OTHER): Payer: 59 | Admitting: Psychiatry

## 2021-10-31 VITALS — BP 133/68 | HR 65 | Ht 62.0 in | Wt 123.8 lb

## 2021-10-31 DIAGNOSIS — R413 Other amnesia: Secondary | ICD-10-CM | POA: Diagnosis not present

## 2021-10-31 NOTE — Progress Notes (Signed)
[facility]  PATIENT: [name] DOB: Dec 19, 1934  REFERRING CLINICIAN: [name], MD HISTORY FROM: granddaughter [place] VISIT: memory loss   HISTORICAL  CHIEF COMPLAINT:  Chief Complaint  Patient presents with   Cognitive decline    Rm 1 New Pt grd dgtr- [place]  MOCA 8    HISTORY OF PRESENT ILLNESS:  The patient presents for evaluation of memory loss which has been gradual over the the past couple of years but worsened in the past year. States she can't think as quickly or easily as she used to. Feels she is slower to process things. She lives with her son who helps manage her medications and finances, otherwise she is able to perform her ADLs independently. She does drive to the grocery store and has never gotten lost or been in an accident. Per daughter she has previously been diagnosed with vascular dementia.  She notes that she had an episode of dehydration a few months ago and was acutely confused at that time. This has since resolved.   Brain MRI 07/05/19 with stable 2.8 cm anterior parafalcine meningioma, chronic cerebellar infarcts, mild generalized atrophy, and mild-moderate chronic small vessel disease. MRA head showed stable moderate stenosis of right ICA.  She occasionally has an unsteady gait but has not fallen.  TBI:  No past history of TBI Stroke: History of remote cerebellar infarcts  Seizures:  no past history of seizures Sleep: sleeps well, no history of sleep apnea Mood:  patient denies anxiety and depression  Functional status:  Patient lives with her son Cooking: Does all the cooking, has never left the stove on Cleaning: Does all of the cleaning Shopping: She does all of the grocery shopping Driving: She is still driving and has never gotten lost, no recent accidents Bills: Son manages finances Medications: Son helps manage medications Forgetting loved ones names?: no Word finding difficulty? no  OTHER  MEDICAL CONDITIONS: HTN, CHF, GERD, CKD, CVA 2017, depression, parafalcine meningioma   REVIEW OF SYSTEMS: Full 14 system review of systems performed and negative with exception of: memory loss  ALLERGIES: Allergies  Allergen Reactions   Ibuprofen Other (See Comments)    PT limited intake because of chronic kidney DX    HOME MEDICATIONS: Outpatient Medications Prior to Visit  Medication Sig Dispense Refill   acetaminophen (TYLENOL) 325 MG tablet Take 325 mg by mouth daily as needed for headache (pain).     allopurinol (ZYLOPRIM) 100 MG tablet Take 2 tablets (200 mg total) by mouth daily. (Patient taking differently: Take 100 mg by mouth 2 (two) times daily.) 30 tablet 2   amLODipine (NORVASC) 10 MG tablet TAKE 1 TABLET(10 MG) BY MOUTH AT BEDTIME Strength: 10 mg 90 tablet 3   buPROPion (WELLBUTRIN XL) 300 MG 24 hr tablet TAKE 1 TABLET(300 MG) BY MOUTH DAILY (Patient taking differently: Take 300 mg by mouth daily.) 90 tablet 1   famotidine (PEPCID) 20 MG tablet TAKE 1 TABLET(20 MG) BY MOUTH TWICE DAILY 90 tablet 1   ibuprofen (ADVIL) 200 MG tablet Take 200 mg by mouth daily as needed for headache (pain).     labetalol (NORMODYNE) 200 MG tablet Take 1 tablet (200 mg total) by mouth 2 (two) times daily. 180 tablet 3   Multiple Vitamins-Minerals (MULTIVITAMIN WITH MINERALS) tablet Take 1 tablet by mouth daily.      furosemide (LASIX) 80 MG tablet Take 0.5 tablets (40 mg total) by mouth daily. (Patient not taking: Reported on 10/31/2021) 180 tablet  0   SENSIPAR 30 MG tablet TAKE 1 TABLET BY MOUTH ONCE DAILY (Patient not taking: Reported on 08/01/2021) 90 tablet 0   aspirin EC 81 MG tablet Take 81 mg by mouth at bedtime.  (Patient not taking: Reported on 08/01/2021)     No facility-administered medications prior to visit.    PAST MEDICAL HISTORY: Past Medical History:  Diagnosis Date   Anemia due to blood loss, chronic    Anemia, iron deficiency    Angiodysplasia of colon 05/09/2005   Single  small angiodysplastic lesion in the descending colon found on colonoscopy 05/09/2005 by [name]   Arthritis    "knots on my thumbs; left elbow" (03/03/2014)   Benign hypertensive heart and kidney disease and CKD stage V ([place]) 10/30/2019   Cataracts, bilateral    Chest pain, non-cardiac 05/30/2017   Chronic ankle pain    bilateral   Chronic kidney disease, stage IV (severe) ([place])    [name] 02/09/2014   Chronic knee pain    bilateral   Chronic thumb pain    Depression    Diverticulitis of large intestine without perforation or abscess without bleeding 05/21/2018   Ear noise 01/07/2013   Epicondylitis    Fatigue 5/40/0867   Gastritis, Helicobacter pylori    Noted on EGD 01/18/2009 by [name];  biopsy showed chronic gastritis with Helicobacter pylori, treated by [name].   GERD (gastroesophageal reflux disease)    Gout    Hemorrhoids, internal 05/09/2005    Found on colonoscopy 05/09/2005 by [name]   History of blood transfusion    "low blood count"   Hypertension    Osteoporosis, unspecified 03/06/2012   A DXA bone density scan done 02/20/2012 showed osteoporosis with a lumbar spine T-score of -4.4 and a right femur T-score of -2.8.  Patient has chronic kidney disease with estimated GFR less than 30, and it is not clear how much of her osteoporosis may be due to renal osteodystrophy.     Pericardial effusion 11/2004   S/P subxiphoid pericardial window 11/01/2004 by [name].  A question of possible collagen vascular disease had been raised in 2006 due to arthralgias, a history of idiopathic pericarditis, and increased pigmentation of her palms, and she was referred to [name] but he was unable to confirm a rheumatologic diagnosis.   Pinguecula    Renal failure    Formerly on hemodialysis; managed by Dr. Corliss Parish   Stiffness of joint, not elsewhere classified, hand    Stroke [facility]) 2007   "when my kidneys went out & I was in a coma;  weak LUE/LLE since"   Subjective tinnitus of both ears 01/24/2018   Thigh pain    UTI (urinary tract infection) 03/21/2011   Vitreous detachment    "no OR"    PAST SURGICAL HISTORY: Past Surgical History:  Procedure Laterality Date   ABDOMINAL HYSTERECTOMY  1960's   ARTERIOVENOUS GRAFT PLACEMENT Left 11/28/2005   forearm arteriovenous   CARDIAC CATHETERIZATION  01/2005   [name] 05/15/2010   HALLUX VALGUS CORRECTION Left 01/2012   foot   SUBXYPHOID PERICARDIAL WINDOW  11/01/2004   THROMBECTOMY Left  03/06/2006   Simple thrombectomy arteriovenous Gore-Tex graft, left arm, with exploration of venous end and intraoperative shuntogram.          THROMBECTOMY / ARTERIOVENOUS GRAFT REVISION Left 01/21/2006   Thrombectomy and revision of left forearm loop AV graft.   TUBAL LIGATION  1960's   "before hysterectomy"  FAMILY HISTORY: Family History  Problem Relation Age of Onset   Hypertension Brother    Heart disease Father    Diabetes Brother    Breast cancer Neg Hx    Colon cancer Neg Hx    Lung cancer Neg Hx    Cervical cancer Neg Hx    Sickle cell anemia Neg Hx     SOCIAL HISTORY: Social History   Socioeconomic History   Marital status: Legally Separated    Spouse name: Not on file   Number of children: 6   Years of education: Not on file   Highest education level: 5th grade  Occupational History   Not on file  Tobacco Use   Smoking status: Former    Packs/day: 0.10    Years: 1.00    Total pack years: 0.10    Types: Cigarettes    Quit date: 01/02/1960    Years since quitting: 61.8   Smokeless tobacco: Never   Tobacco comments:    patient stated has never smoked   Vaping Use   Vaping Use: Never used  Substance and Sexual Activity   Alcohol use: No    Alcohol/week: 0.0 standard drinks of alcohol   Drug use: No   Sexual activity: Not on file  Other Topics Concern   Not on file  Social History Narrative   10/31/21 lives with son   Social Determinants of  Health   Financial Resource Strain: Not on file  Food Insecurity: Not on file  Transportation Needs: Not on file  Physical Activity: Not on file  Stress: Not on file  Social Connections: Not on file  Intimate Partner Violence: Not on file     PHYSICAL EXAM  GENERAL EXAM/CONSTITUTIONAL: Vitals:  Vitals:   10/31/21 1021  BP: 133/68  Pulse: 65  Weight: 123 lb 12.8 oz (56.2 kg)  Height: '5\' 2"'$  (1.575 m)   Body mass index is 22.64 kg/m. Wt Readings from Last 3 Encounters:  10/31/21 123 lb 12.8 oz (56.2 kg)  09/26/21 124 lb 8 oz (56.5 kg)  08/08/21 127 lb 8 oz (57.8 kg)    NEUROLOGIC: MENTAL STATUS:     10/31/2021   10:28 AM  Montreal Cognitive Assessment   Visuospatial/ Executive (0/5) 1  Naming (0/3) 3  Attention: Read list of digits (0/2) 1  Attention: Read list of letters (0/1) 1  Attention: Serial 7 subtraction starting at 100 (0/3) 1  Language: Repeat phrase (0/2) 0  Language : Fluency (0/1) 0  Abstraction (0/2) 0  Delayed Recall (0/5) 0  Orientation (0/6) 1  Total 8    CRANIAL NERVE:  2nd, 3rd, 4th, 6th - pupils equal and reactive to light, visual fields full to confrontation, extraocular muscles intact, no nystagmus 5th - facial sensation symmetric 7th - facial strength symmetric 8th - hearing intact 9th - palate elevates symmetrically, uvula midline 11th - shoulder shrug symmetric 12th - tongue protrusion midline  MOTOR:  normal bulk and tone, no cogwheeling, full strength in the BUE, BLE  SENSORY:  normal and symmetric to light touch all 4 extremities  COORDINATION:  finger-nose-finger, fine finger movements normal, no tremor  REFLEXES:  deep tendon reflexes present and symmetric  GAIT/STATION:  normal     DIAGNOSTIC DATA (LABS, IMAGING, TESTING) - I reviewed patient records, labs, notes, testing and imaging myself where available.  Lab Results  Component Value Date   WBC 4.2 09/26/2021   HGB 10.3 (L) 09/26/2021   HCT 30.3 (L)  09/26/2021  MCV 93 09/26/2021   PLT 183 09/26/2021      Component Value Date/Time   NA 141 09/26/2021 1137   K 5.0 09/26/2021 1137   CL 102 09/26/2021 1137   CO2 21 09/26/2021 1137   GLUCOSE 88 09/26/2021 1137   GLUCOSE 86 08/02/2021 0851   BUN 46 (H) 09/26/2021 1137   CREATININE 3.30 (H) 09/26/2021 1137   CREATININE 2.50 (H) 05/06/2014 0918   CALCIUM 9.8 09/26/2021 1137   CALCIUM 9.1 03/12/2012 1016   PROT 6.8 09/26/2021 1137   ALBUMIN 4.5 09/26/2021 1137   AST 20 09/26/2021 1137   ALT 15 09/26/2021 1137   ALKPHOS 123 (H) 09/26/2021 1137   BILITOT 0.4 09/26/2021 1137   GFRNONAA 17 (L) 08/02/2021 0851   GFRNONAA 18 (L) 05/06/2014 0918   GFRAA 16 (L) 07/05/2019 1137   GFRAA 21 (L) 05/06/2014 0918   Lab Results  Component Value Date   CHOL 203 (H) 05/24/2018   HDL 81 05/24/2018   LDLCALC 105 (H) 05/24/2018   TRIG 84 05/24/2018   CHOLHDL 2.5 05/24/2018   Lab Results  Component Value Date   HGBA1C 5.4 10/16/2017   No results found for: "VITAMINB12" Lab Results  Component Value Date   TSH 4.450 09/26/2021     ASSESSMENT AND PLAN  87 y.o. year old female with a history of HTN, CHF, GERD, CKD, CVA 2017, depression, parafalcine meningioma who presents for evaluation of memory loss. Per daughter, the patient has previously been diagnosed with vascular dementia. MRI in 2021 with chronic infarcts, mild generalized atrophy, and stable meningioma. MOCA score today is 8/30, consistent with dementia. Son helps with her medications and finances, and she is otherwise relatively independent. She is not interested in starting any new medications at this time.  Will check B12 level today to see if this may be contributing to her memory loss. Discussed safety measures including driving safety. Acute confusion was likely secondary to her dehydration and UTI, will defer imaging at this this time as it has resolved. Advised patient to return to clinic if confusion returns or new concerns  develop.   1. Memory loss       PLAN: - Labs: B12 - Patient declined memory medication at this time - Limit driving to short distances and familiar places, preferably with someone else in the car - Dementia resource packet provided - Return to clinic if acute confusion reoccurs or new concerns develop  Orders Placed This Encounter  Procedures   Vitamin B12    Return if symptoms worsen or fail to improve.  I spent an average of 40 minutes chart reviewing and counseling the patient, with at least 50% of the time face to face with the patient. General brain health measures discussed. Reviewed safety measures including driving safety.   Genia Harold, MD 10/31/21 11:14 AM  [facility] [address] [place], [place] [address] [phone]

## 2021-10-31 NOTE — Patient Instructions (Signed)
   Tasks to improve attention/working memory 1. Good sleep hygiene (7-8 hrs of sleep) 2. Learning a new skill (Painting, Carpentry, Pottery, new language, Knitting). 3.Cognitive exercises (keep a daily journal, Puzzles) 4. Physical exercise and training  (30 min/day X 4 days week) 5. Being on Antidepressant if needed 6.Yoga, Meditation, Tai Chi 7. Decrease alcohol intake 8.Have a clear schedule and structure in daily routine  MIND Diet: The [place] Diet Intervention for Neurodegenerative Delay, or MIND diet, targets the health of the aging brain. Research participants with the highest MIND diet scores had a significantly slower rate of cognitive decline compared with those with the lowest scores. The effects of the MIND diet on cognition showed greater effects than either the Mediterranean or the DASH diet alone.  The healthy items the MIND diet guidelines suggest include:  3+ servings a day of whole grains 1+ servings a day of vegetables (other than green leafy) 6+ servings a week of green leafy vegetables 5+ servings a week of nuts 4+ meals a week of beans 2+ servings a week of berries 2+ meals a week of poultry 1+ meals a week of fish Mainly olive oil if added fat is used  The unhealthy items, which are higher in saturated and trans fat, include: Less than 5 servings a week of pastries and sweets Less than 4 servings a week of red meat (including beef, pork, lamb, and products made from these meats) Less than one serving a week of cheese and fried foods Less than 1 tablespoon a day of butter/stick margarine

## 2021-11-01 ENCOUNTER — Telehealth: Payer: Self-pay

## 2021-11-01 LAB — VITAMIN B12: Vitamin B-12: 1866 pg/mL — ABNORMAL HIGH (ref 232–1245)

## 2021-11-01 NOTE — Telephone Encounter (Signed)
-----   Message from Genia Harold, MD sent at 11/01/2021  8:56 AM EDT ----- B12 level is normal

## 2021-11-01 NOTE — Telephone Encounter (Signed)
Contacted pt granddaughter per DPR, informed her B12 was normal.  Advised to call office back if she had any questions as she had none at this time and was appreciative.

## 2021-11-03 ENCOUNTER — Emergency Department (HOSPITAL_COMMUNITY)
Admission: EM | Admit: 2021-11-03 | Discharge: 2021-11-04 | Disposition: A | Discharge status: Home / Self Care | Payer: 59 | Attending: Emergency Medicine | Admitting: Emergency Medicine

## 2021-11-03 ENCOUNTER — Other Ambulatory Visit: Payer: Self-pay

## 2021-11-03 ENCOUNTER — Encounter (HOSPITAL_COMMUNITY): Payer: Self-pay | Admitting: *Deleted

## 2021-11-03 DIAGNOSIS — Z79899 Other long term (current) drug therapy: Secondary | ICD-10-CM | POA: Insufficient documentation

## 2021-11-03 DIAGNOSIS — M25561 Pain in right knee: Secondary | ICD-10-CM | POA: Diagnosis present

## 2021-11-03 DIAGNOSIS — I129 Hypertensive chronic kidney disease with stage 1 through stage 4 chronic kidney disease, or unspecified chronic kidney disease: Secondary | ICD-10-CM | POA: Insufficient documentation

## 2021-11-03 DIAGNOSIS — N184 Chronic kidney disease, stage 4 (severe): Secondary | ICD-10-CM | POA: Diagnosis not present

## 2021-11-03 MED ORDER — ACETAMINOPHEN 325 MG PO TABS
650.0000 mg | ORAL_TABLET | Freq: Once | ORAL | Status: AC
  Administered 2021-11-03: 650 mg via ORAL
  Filled 2021-11-03: qty 2

## 2021-11-03 NOTE — ED Provider Triage Note (Signed)
Emergency Medicine Provider Triage Evaluation Note  [name] , a 87 y.o. female  was evaluated in triage.  Pt complains onset right knee pain this afternoon. No pain medication since the pain started, though she did experience relief from the pain yesterday after taking tylenol. No falls or trauma, no calf pain, difficulty walking, or numbness/tingling/weakness in the leg.   Review of Systems  Positive: As above Negative: As above  Physical Exam  BP (!) 149/75   Pulse 61   Temp 98.2 F (36.8 C)   Resp 16   SpO2 97%  Gen:   Awake, no distress   Resp:  Normal effort  MSK:   Moves extremities without difficulty  Other:  TTP over the right tibial tuberosity without skin changes, erythema, induration, or crepitus.  No calf tenderness palpation.  Full range of motion of the knee passively.  Normal pulses distal to the knee.  Medical Decision Making  Medically screening exam initiated at 11:31 PM.  Appropriate orders placed.  [name] was informed that the remainder of the evaluation will be completed by another provider, this initial triage assessment does not replace that evaluation, and the importance of remaining in the ED until their evaluation is complete.  This chart was dictated using voice recognition software, Dragon. Despite the best efforts of this provider to proofread and correct errors, errors may still occur which can change documentation meaning.    Emeline Darling, PA-C 11/03/21 2338

## 2021-11-03 NOTE — ED Triage Notes (Signed)
Pt reporting right (anterior) knee pain. Stating it was hurting, she took some tylenol and it was better, she has been able to walk on it today. The pain started back tonight. She denies any falls or trauma.

## 2021-11-03 NOTE — ED Triage Notes (Signed)
Pt arrives via [facility] from home. Per report the pt started having right knee pain last night, she took 2 tylenol with improvement and has been walking today. Tonight, after eating dinner started hurting again. No falls or trauma. En route, 152/98, 74hr, 99% ra.

## 2021-11-04 ENCOUNTER — Emergency Department (HOSPITAL_COMMUNITY): Payer: 59

## 2021-11-04 DIAGNOSIS — M25561 Pain in right knee: Secondary | ICD-10-CM | POA: Diagnosis not present

## 2021-11-04 MED ORDER — DICLOFENAC SODIUM 1 % EX GEL
2.0000 g | Freq: Once | CUTANEOUS | Status: AC
  Administered 2021-11-04: 2 g via TOPICAL
  Filled 2021-11-04: qty 100

## 2021-11-04 NOTE — ED Notes (Signed)
Pt ambulated without difficulties with limited assistance (Typically uses cane at home).

## 2021-11-04 NOTE — ED Notes (Signed)
Patient verbalizes understanding of d/c instructions. Opportunities for questions and answers were provided. Pt d/c from ED and ambulated to lobby with RN. Taxi voucher called in for pt.

## 2021-11-04 NOTE — ED Provider Notes (Signed)
[facility] EMERGENCY DEPARTMENT Provider Note   CSN: [id number] Arrival date & time: 11/03/21  2309     History Chief Complaint  Patient presents with   Knee Pain    [name] is a 87 y.o. female with h/o osteoporosis, CKD stage IV, hypertension presents the emergency department for evaluation of right knee pain since yesterday.  She reports that she took some Tylenol and it felt better and she is able to walk on it today.  She reports that later tonight the pain started again.  She denies any falls or trauma to the area.  Denies any recent injections into the area.  Denies any previous surgeries to her knee.  She denies any fevers.  Denies any numbness or tingling to her leg.   Knee Pain Associated symptoms: no fever        Home Medications Prior to Admission medications   Medication Sig Start Date End Date Taking? Authorizing Provider  acetaminophen (TYLENOL) 325 MG tablet Take 325 mg by mouth daily as needed for headache (pain).    [provider]  allopurinol (ZYLOPRIM) 100 MG tablet Take 2 tablets (200 mg total) by mouth daily. Patient taking differently: Take 100 mg by mouth 2 (two) times daily. 03/22/21 03/22/22  [name], MD  amLODipine (NORVASC) 10 MG tablet TAKE 1 TABLET(10 MG) BY MOUTH AT BEDTIME Strength: 10 mg 08/31/21   [name], MD  buPROPion (WELLBUTRIN XL) 300 MG 24 hr tablet TAKE 1 TABLET(300 MG) BY MOUTH DAILY Patient taking differently: Take 300 mg by mouth daily. 06/05/21   [name], MD  famotidine (PEPCID) 20 MG tablet TAKE 1 TABLET(20 MG) BY MOUTH TWICE DAILY 09/12/21   [name], MD  furosemide (LASIX) 80 MG tablet Take 0.5 tablets (40 mg total) by mouth daily. Patient not taking: Reported on 10/31/2021 01/09/21   [name], MD  ibuprofen (ADVIL) 200 MG tablet Take 200 mg by mouth daily as needed for headache (pain).    [provider]  labetalol (NORMODYNE) 200 MG tablet Take 1  tablet (200 mg total) by mouth 2 (two) times daily. 12/09/19   Werner Lean, MD  Multiple Vitamins-Minerals (MULTIVITAMIN WITH MINERALS) tablet Take 1 tablet by mouth daily.     [provider]  SENSIPAR 30 MG tablet TAKE 1 TABLET BY MOUTH ONCE DAILY Patient not taking: Reported on 08/01/2021 10/02/16   [name], MD      Allergies    Ibuprofen    Review of Systems   Review of Systems  Constitutional:  Negative for chills and fever.  Musculoskeletal:  Positive for arthralgias.    Physical Exam Updated Vital Signs BP (!) 149/75   Pulse 61   Temp 98.2 F (36.8 C)   Resp 16   SpO2 97%  Physical Exam Vitals and nursing note reviewed.  Constitutional:      General: She is not in acute distress.    Appearance: Normal appearance. She is not ill-appearing or toxic-appearing.  Eyes:     General: No scleral icterus. Pulmonary:     Effort: Pulmonary effort is normal. No respiratory distress.  Musculoskeletal:        General: Tenderness present.       Legs:     Comments: Diffuse tenderness to the anterior right knee, more tenderness to the more medial aspect of her knee.  No overlying warmth or erythema. Compartments are soft.  Sensation intact throughout.  Pulses intact.  Skin:  General: Skin is dry.     Findings: No rash.  Neurological:     General: No focal deficit present.     Mental Status: She is alert. Mental status is at baseline.  Psychiatric:        Mood and Affect: Mood normal.     ED Results / Procedures / Treatments   Labs (all labs ordered are listed, but only abnormal results are displayed) Labs Reviewed - No data to display  EKG None  Radiology DG Knee Complete 4 Views Right  Result Date: 11/04/2021 CLINICAL DATA:  Right knee pain EXAM: RIGHT KNEE - COMPLETE 4+ VIEW COMPARISON:  None Available. FINDINGS: No fracture or dislocation is seen. Moderate degenerative changes, most prominent in the lateral compartment. The visualized  soft tissues are unremarkable. No suprapatellar knee joint effusion. IMPRESSION: Moderate degenerative changes, most prominent in the lateral compartment. No fracture or dislocation is seen. Electronically Signed   By: Julian Hy M.D.   On: 11/04/2021 00:48    Procedures Procedures   Medications Ordered in ED Medications  diclofenac Sodium (VOLTAREN) 1 % topical gel 2 g (has no administration in time range)  acetaminophen (TYLENOL) tablet 650 mg (650 mg Oral Given 11/03/21 2351)    ED Course/ Medical Decision Making/ A&P                           Medical Decision Making  ***   87 year old female presents emerged department for evaluation of right knee pain.  Differential diagnosis includes was but is not limited to  Final Clinical Impression(s) / ED Diagnoses Final diagnoses:  Acute pain of right knee    Rx / DC Orders ED Discharge Orders     None

## 2021-11-04 NOTE — Discharge Instructions (Addendum)
You were seen in the ER for evaluation of your right knee pain.  Your x-ray shows that you have some osteoarthritis to the area.  I likely think this is what is causing your pain.  There is no fracture or dislocation seen.  I would like for you to use Tylenol 1000 mg every 6 hours as needed for pain.  You can try things like Voltaren gel or lidocaine patches to your knee as well.  If you have any concerns, new or worsening symptoms, please return to the nearest emergency department for evaluation.  Contact a doctor if: The knee pain does not stop. The knee pain changes or gets worse. You have a fever along with knee pain. Your knee is red or feels warm when you touch it. Your knee gives out or locks up. Get help right away if: Your knee swells, and the swelling gets worse. You cannot move your knee. You have very bad knee pain that does not get better with pain medicine.

## 2021-12-03 ENCOUNTER — Other Ambulatory Visit: Payer: Self-pay | Admitting: Internal Medicine

## 2021-12-03 DIAGNOSIS — K219 Gastro-esophageal reflux disease without esophagitis: Secondary | ICD-10-CM

## 2021-12-03 DIAGNOSIS — F32A Depression, unspecified: Secondary | ICD-10-CM

## 2021-12-04 ENCOUNTER — Other Ambulatory Visit: Payer: Self-pay | Admitting: Internal Medicine

## 2021-12-04 DIAGNOSIS — N184 Chronic kidney disease, stage 4 (severe): Secondary | ICD-10-CM

## 2022-01-02 ENCOUNTER — Encounter: Payer: Self-pay | Admitting: *Deleted

## 2022-01-17 ENCOUNTER — Encounter: Payer: 59 | Admitting: Internal Medicine

## 2022-02-01 LAB — LAB REPORT - SCANNED: EGFR: 22

## 2022-02-08 ENCOUNTER — Encounter (HOSPITAL_COMMUNITY): Payer: 59

## 2022-02-15 ENCOUNTER — Encounter (HOSPITAL_COMMUNITY): Payer: 59

## 2022-02-28 ENCOUNTER — Other Ambulatory Visit (HOSPITAL_COMMUNITY): Payer: Self-pay | Admitting: *Deleted

## 2022-03-01 ENCOUNTER — Encounter (HOSPITAL_COMMUNITY): Payer: 59

## 2022-03-09 ENCOUNTER — Encounter (HOSPITAL_COMMUNITY): Payer: 59

## 2022-03-19 ENCOUNTER — Encounter (HOSPITAL_COMMUNITY)
Admission: RE | Admit: 2022-03-19 | Discharge: 2022-03-19 | Disposition: A | Discharge status: Home / Self Care | Payer: 59 | Source: Ambulatory Visit | Attending: Nephrology | Admitting: Nephrology

## 2022-03-19 ENCOUNTER — Other Ambulatory Visit: Payer: Self-pay | Admitting: Internal Medicine

## 2022-03-19 DIAGNOSIS — D631 Anemia in chronic kidney disease: Secondary | ICD-10-CM | POA: Insufficient documentation

## 2022-03-19 DIAGNOSIS — F32A Depression, unspecified: Secondary | ICD-10-CM

## 2022-03-19 DIAGNOSIS — N184 Chronic kidney disease, stage 4 (severe): Secondary | ICD-10-CM | POA: Diagnosis present

## 2022-03-19 MED ORDER — SODIUM CHLORIDE 0.9 % IV SOLN
510.0000 mg | INTRAVENOUS | Status: DC
  Administered 2022-03-19: 510 mg via INTRAVENOUS
  Filled 2022-03-19: qty 510

## 2022-03-21 ENCOUNTER — Encounter: Payer: Self-pay | Admitting: Nephrology

## 2022-03-26 ENCOUNTER — Encounter (HOSPITAL_COMMUNITY): Payer: 59

## 2022-03-30 ENCOUNTER — Encounter (HOSPITAL_COMMUNITY)
Admission: RE | Admit: 2022-03-30 | Discharge: 2022-03-30 | Disposition: A | Discharge status: Home / Self Care | Payer: 59 | Source: Ambulatory Visit | Attending: Nephrology | Admitting: Nephrology

## 2022-03-30 DIAGNOSIS — N184 Chronic kidney disease, stage 4 (severe): Secondary | ICD-10-CM | POA: Diagnosis not present

## 2022-03-30 MED ORDER — SODIUM CHLORIDE 0.9 % IV SOLN
510.0000 mg | INTRAVENOUS | Status: DC
  Administered 2022-03-30: 510 mg via INTRAVENOUS
  Filled 2022-03-30: qty 510

## 2022-06-27 LAB — LAB REPORT - SCANNED: EGFR: 20

## 2022-07-04 ENCOUNTER — Ambulatory Visit: Payer: 59 | Admitting: Internal Medicine

## 2022-07-30 NOTE — Progress Notes (Unsigned)
Cardiology Office Note:  .   Date:  07/31/2022  ID:  [name], DOB 1934/05/29, MRN [id number] PCP: Reymundo Poll, MD  [facility] Providers Cardiologist:  Christell Constant, MD {  History of Present Illness: .   [name] is a 88 y.o. female with a past medical history of hypertension, mild mitral regurgitation, CKD stage V not on HD, prior CVA 2017, question of collagen vascular disease not confirmed by rheumatology, who presents for follow-up appointment.  Was seen for hypertension 10/30/2019 and started on labetalol with amatory blood pressure monitoring.  Then was seen 12/09/2019 and was on conservative management plan for [facility] which was noted.  Was last seen April 2022 and at that time did not have any chest pain or pressure.  No shortness of breath or DOE.  No PND/orthopnea.  Was in the ED with a UTI vertigo 03/04/2020.  Was given some Zofran.  No weight gain or leg swelling.  No palpitations or syncope.  Was staying active doing housework and helping church members going to the store.  Ambulatory blood pressure was 135/80.  Today, she tells me she does not have any symptoms.  Able to walk okay.  Has swelling sometimes but dangles her legs over a chair most of the day.  Suggested wearing compression socks/stockings.  Recently saw her nephrologist and gave her arthritis cream for her legs.  We discussed updating an echo and lipid panel.  Reports no shortness of breath nor dyspnea on exertion. Reports no chest pain, pressure, or tightness. No edema, orthopnea, PND. Reports no palpitations.    ROS: Pertinent ROS in HPI  Studies Reviewed: [name]   EKG Interpretation Date/Time:  Tuesday July 31 2022 08:50:48 EDT Ventricular Rate:  61 PR Interval:  174 QRS Duration:  98 QT Interval:  414 QTC Calculation: 416 R Axis:   17  Text Interpretation: Normal sinus rhythm Normal ECG When compared with ECG of 31-Jul-2021 20:15, PREVIOUS ECG IS PRESENT Confirmed by Jari Favre [phone]) on 07/31/2022 1:05:47 PM   Transthoracic Echocardiogram: Date:04/05/20 Results: HTN through study, mild to moderate TR, mild MR  1. Left ventricular ejection fraction, by estimation, is 60 to 65%. The  left ventricle has normal function. The left ventricle demonstrates  regional wall motion abnormalities (see scoring diagram/findings for  description). There is mild left ventricular   hypertrophy of the basal-septal segment. Left ventricular diastolic  parameters are consistent with Grade I diastolic dysfunction (impaired  relaxation). There is moderate hypokinesis of the left ventricular, basal  inferior wall.   2. Right ventricular systolic function is normal. The right ventricular  size is normal. There is mildly elevated pulmonary artery systolic  pressure. The estimated right ventricular systolic pressure is 38.0 mmHg.   3. Left atrial size was moderately dilated.   4. The mitral valve is abnormal. Mild mitral valve regurgitation.   5. Tricuspid valve regurgitation is mild to moderate.   6. The aortic valve is tricuspid. Aortic valve regurgitation is trivial.   7. The inferior vena cava is normal in size with greater than 50%  respiratory variability, suggesting right atrial pressure of 3 mmHg.   8. Aneurysmal IAS. Cannot exclude PFO.          Physical Exam:   VS:  BP 120/60   Pulse (!) 59   Ht 5\' 2"  (1.575 m)   Wt 123 lb 9.6 oz (56.1 kg)   SpO2 95%   BMI 22.61 kg/m    Wt  Readings from Last 3 Encounters:  07/31/22 123 lb 9.6 oz (56.1 kg)  10/31/21 123 lb 12.8 oz (56.2 kg)  09/26/21 124 lb 8 oz (56.5 kg)    GEN: Well nourished, well developed in no acute distress NECK: No JVD; No carotid bruits CARDIAC: RRR, no murmurs, rubs, gallops RESPIRATORY:  Clear to auscultation without rales, wheezing or rhonchi  ABDOMEN: Soft, non-tender, non-distended EXTREMITIES:  No edema; No deformity   ASSESSMENT AND PLAN: .   1 essential hypertension -Well-controlled today,  120/80, heart rate 59 -Continue current medications including labetalol 200 mg twice a day, amlodipine 10 mg daily  2 nonrheumatic mitral valve regurgitation -Update echocardiogram ordered  3 nonrheumatic tricuspid valve regurgitation -Update echocardiogram ordered   4 WMA seen 11/2019 -no chest pains  5.  CKD stage V -She has a nephrologist -Conservative treatment, not starting any diuretics at this time -Would recommend lower extremity compression and elevation for her lower extremity swelling    Dispo: She can follow-up in 1 year with Dr. Izora Ribas  Signed, Sharlene Dory, PA-C

## 2022-07-31 ENCOUNTER — Other Ambulatory Visit: Payer: Self-pay | Admitting: *Deleted

## 2022-07-31 ENCOUNTER — Encounter: Payer: Self-pay | Admitting: Physician Assistant

## 2022-07-31 ENCOUNTER — Ambulatory Visit: Payer: 59 | Attending: Internal Medicine | Admitting: Physician Assistant

## 2022-07-31 VITALS — BP 120/60 | HR 59 | Ht 62.0 in | Wt 123.6 lb

## 2022-07-31 DIAGNOSIS — I1311 Hypertensive heart and chronic kidney disease without heart failure, with stage 5 chronic kidney disease, or end stage renal disease: Secondary | ICD-10-CM

## 2022-07-31 DIAGNOSIS — N185 Chronic kidney disease, stage 5: Secondary | ICD-10-CM

## 2022-07-31 DIAGNOSIS — I340 Nonrheumatic mitral (valve) insufficiency: Secondary | ICD-10-CM

## 2022-07-31 DIAGNOSIS — I10 Essential (primary) hypertension: Secondary | ICD-10-CM

## 2022-07-31 DIAGNOSIS — I361 Nonrheumatic tricuspid (valve) insufficiency: Secondary | ICD-10-CM | POA: Diagnosis not present

## 2022-07-31 DIAGNOSIS — R9389 Abnormal findings on diagnostic imaging of other specified body structures: Secondary | ICD-10-CM

## 2022-07-31 MED ORDER — AMLODIPINE BESYLATE 10 MG PO TABS: ORAL_TABLET | ORAL | 3 refills | Status: AC

## 2022-07-31 MED ORDER — LABETALOL HCL 200 MG PO TABS: 200.0000 mg | ORAL_TABLET | Freq: Two times a day (BID) | ORAL | 3 refills | Status: AC

## 2022-07-31 NOTE — Patient Instructions (Signed)
Medication Instructions:   Your physician recommends that you continue on your current medications as directed. Please refer to the Current Medication list given to you today.   *If you need a refill on your cardiac medications before your next appointment, please call your pharmacy*   Lab Work:   Your physician recommends that you return for a FASTING lipid profile and lft with your Primary Care Doctor. Fasting after midnight.   If you have labs (blood work) drawn today and your tests are completely normal, you will receive your results only by: MyChart Message (if you have MyChart) OR A paper copy in the mail If you have any lab test that is abnormal or we need to change your treatment, we will call you to review the results.   Testing/Procedures:  Your physician has requested that you have an echocardiogram. Echocardiography is a painless test that uses sound waves to create images of your heart. It provides your doctor with information about the size and shape of your heart and how well your heart's chambers and valves are working. This procedure takes approximately one hour. There are no restrictions for this procedure. Please do NOT wear perfume, aftershave, or lotions (deodorant is allowed). Please arrive 15 minutes prior to your appointment time.    Follow-Up: At [facility], you and your health needs are our priority.  As part of our continuing mission to provide you with exceptional heart care, we have created designated Provider Care Teams.  These Care Teams include your primary Cardiologist (physician) and Advanced Practice Providers (APPs -  Physician Assistants and Nurse Practitioners) who all work together to provide you with the care you need, when you need it.  We recommend signing up for the patient portal called "MyChart".  Sign up information is provided on this After Visit Summary.  MyChart is used to connect with patients for Virtual Visits  (Telemedicine).  Patients are able to view lab/test results, encounter notes, upcoming appointments, etc.  Non-urgent messages can be sent to your provider as well.   To learn more about what you can do with MyChart, go to [email or web address].    Your next appointment:   1 year(s)  Provider:   Christell Constant, MD     Other Instructions  Your physician wants you to follow-up in: 1 year.  You will receive a reminder letter in the mail two months in advance. If you don't receive a letter, please call our office to schedule the follow-up appointment.  DASH Eating Plan DASH stands for Dietary Approaches to Stop Hypertension. The DASH eating plan is a healthy eating plan that has been shown to: Lower high blood pressure (hypertension). Reduce your risk for type 2 diabetes, heart disease, and stroke. Help with weight loss. What are tips for following this plan? Reading food labels Check food labels for the amount of salt (sodium) per serving. Choose foods with less than 5 percent of the Daily Value (DV) of sodium. In general, foods with less than 300 milligrams (mg) of sodium per serving fit into this eating plan. To find whole grains, look for the word "whole" as the first word in the ingredient list. Shopping Buy products labeled as "low-sodium" or "no salt added." Buy fresh foods. Avoid canned foods and pre-made or frozen meals. Cooking Try not to add salt when you cook. Use salt-free seasonings or herbs instead of table salt or sea salt. Check with your health care provider or pharmacist before using salt substitutes. Do  not fry foods. Cook foods in healthy ways, such as baking, boiling, grilling, roasting, or broiling. Cook using oils that are good for your heart. These include olive, canola, avocado, soybean, and sunflower oil. Meal planning  Eat a balanced diet. This should include: 4 or more servings of fruits and 4 or more servings of vegetables each day. Try to fill half  of your plate with fruits and vegetables. 6-8 servings of whole grains each day. 6 or less servings of lean meat, poultry, or fish each day. 1 oz is 1 serving. A 3 oz (85 g) serving of meat is about the same size as the palm of your hand. One egg is 1 oz (28 g). 2-3 servings of low-fat dairy each day. One serving is 1 cup (237 mL). 1 serving of nuts, seeds, or beans 5 times each week. 2-3 servings of heart-healthy fats. Healthy fats called omega-3 fatty acids are found in foods such as walnuts, flaxseeds, fortified milks, and eggs. These fats are also found in cold-water fish, such as sardines, salmon, and mackerel. Limit how much you eat of: Canned or prepackaged foods. Food that is high in trans fat, such as fried foods. Food that is high in saturated fat, such as fatty meat. Desserts and other sweets, sugary drinks, and other foods with added sugar. Full-fat dairy products. Do not salt foods before eating. Do not eat more than 4 egg yolks a week. Try to eat at least 2 vegetarian meals a week. Eat more home-cooked food and less restaurant, buffet, and fast food. Lifestyle When eating at a restaurant, ask if your food can be made with less salt or no salt. If you drink alcohol: Limit how much you have to: 0-1 drink a day if you are female. 0-2 drinks a day if you are female. Know how much alcohol is in your drink. In the [place] one drink is one 12 oz bottle of beer (355 mL), one 5 oz glass of wine (148 mL), or one 1 oz glass of hard liquor (44 mL). General information Avoid eating more than 2,300 mg of salt a day. If you have hypertension, you may need to reduce your sodium intake to 1,500 mg a day. Work with your provider to stay at a healthy body weight or lose weight. Ask what the best weight range is for you. On most days of the week, get at least 30 minutes of exercise that causes your heart to beat faster. This may include walking, swimming, or biking. Work with your provider or  dietitian to adjust your eating plan to meet your specific calorie needs. What foods should I eat? Fruits All fresh, dried, or frozen fruit. Canned fruits that are in their natural juice and do not have sugar added to them. Vegetables Fresh or frozen vegetables that are raw, steamed, roasted, or grilled. Low-sodium or reduced-sodium tomato and vegetable juice. Low-sodium or reduced-sodium tomato sauce and tomato paste. Low-sodium or reduced-sodium canned vegetables. Grains Whole-grain or whole-wheat bread. Whole-grain or whole-wheat pasta. Brown rice. Orpah Cobb. Bulgur. Whole-grain and low-sodium cereals. Pita bread. Low-fat, low-sodium crackers. Whole-wheat flour tortillas. Meats and other proteins Skinless chicken or [place]. Ground chicken or [place]. Pork with fat trimmed off. Fish and seafood. Egg whites. Dried beans, peas, or lentils. Unsalted nuts, nut butters, and seeds. Unsalted canned beans. Lean cuts of beef with fat trimmed off. Low-sodium, lean precooked or cured meat, such as sausages or meat loaves. Dairy Low-fat (1%) or fat-free (skim) milk. Reduced-fat, low-fat,  or fat-free cheeses. Nonfat, low-sodium ricotta or cottage cheese. Low-fat or nonfat yogurt. Low-fat, low-sodium cheese. Fats and oils Soft margarine without trans fats. Vegetable oil. Reduced-fat, low-fat, or light mayonnaise and salad dressings (reduced-sodium). Canola, safflower, olive, avocado, soybean, and sunflower oils. Avocado. Seasonings and condiments Herbs. Spices. Seasoning mixes without salt. Other foods Unsalted popcorn and pretzels. Fat-free sweets. The items listed above may not be all the foods and drinks you can have. Talk to a dietitian to learn more. What foods should I avoid? Fruits Canned fruit in a light or heavy syrup. Fried fruit. Fruit in cream or butter sauce. Vegetables Creamed or fried vegetables. Vegetables in a cheese sauce. Regular canned vegetables that are not marked as low-sodium  or reduced-sodium. Regular canned tomato sauce and paste that are not marked as low-sodium or reduced-sodium. Regular tomato and vegetable juices that are not marked as low-sodium or reduced-sodium. Rosita Fire. Olives. Grains Baked goods made with fat, such as croissants, muffins, or some breads. Dry pasta or rice meal packs. Meats and other proteins Fatty cuts of meat. Ribs. Fried meat. Tomasa Blase. Bologna, salami, and other precooked or cured meats, such as sausages or meat loaves, that are not lean and low in sodium. Fat from the back of a pig (fatback). Bratwurst. Salted nuts and seeds. Canned beans with added salt. Canned or smoked fish. Whole eggs or egg yolks. Chicken or [place] with skin. Dairy Whole or 2% milk, cream, and half-and-half. Whole or full-fat cream cheese. Whole-fat or sweetened yogurt. Full-fat cheese. Nondairy creamers. Whipped toppings. Processed cheese and cheese spreads. Fats and oils Butter. Stick margarine. Lard. Shortening. Ghee. Bacon fat. Tropical oils, such as coconut, palm kernel, or palm oil. Seasonings and condiments Onion salt, garlic salt, seasoned salt, table salt, and sea salt. Worcestershire sauce. Tartar sauce. Barbecue sauce. Teriyaki sauce. Soy sauce, including reduced-sodium soy sauce. Steak sauce. Canned and packaged gravies. Fish sauce. Oyster sauce. Cocktail sauce. Store-bought horseradish. Ketchup. Mustard. Meat flavorings and tenderizers. Bouillon cubes. Hot sauces. Pre-made or packaged marinades. Pre-made or packaged taco seasonings. Relishes. Regular salad dressings. Other foods Salted popcorn and pretzels. The items listed above may not be all the foods and drinks you should avoid. Talk to a dietitian to learn more. Where to find more information National Heart, Lung, and Blood Institute (NHLBI): BuffaloDryCleaner.gl American Heart Association (AHA): heart.org Academy of Nutrition and Dietetics: eatright.org National Kidney Foundation (NKF): kidney.org This  information is not intended to replace advice given to you by your health care provider. Make sure you discuss any questions you have with your health care provider. Document Revised: 01/04/2022 Document Reviewed: 01/04/2022 Elsevier Patient Education  2024 Elsevier Inc. Adopting a Healthy Lifestyle.   Weight: Know what a healthy weight is for you (roughly BMI <25) and aim to maintain this. You can calculate your body mass index on your smart phone. Unfortunately, this is not the most accurate measure of healthy weight, but it is the simplest measurement to use. A more accurate measurement involves body scanning which measures lean muscle, fat tissue and bony density. We do not have this equipment at [facility].    Diet: Aim for 7+ servings of fruits and vegetables daily Limit animal fats in diet for cholesterol and heart health - choose grass fed whenever available Avoid highly processed foods (fast food burgers, tacos, fried chicken, pizza, hot dogs, french fries)  Saturated fat comes in the form of butter, lard, coconut oil, margarine, partially hydrogenated oils, and fat in meat. These increase your risk  of cardiovascular disease.  Use healthy plant oils, such as olive, canola, soy, corn, sunflower and peanut.  Whole foods such as fruits, vegetables and whole grains have fiber  Men need > 38 grams of fiber per day Women need > 25 grams of fiber per day  Load up on vegetables and fruits - one-half of your plate: Aim for color and variety, and remember that potatoes dont count. Go for whole grains - one-quarter of your plate: Whole wheat, barley, wheat berries, quinoa, oats, brown rice, and foods made with them. If you want pasta, go with whole wheat pasta. Protein power - one-quarter of your plate: Fish, chicken, beans, and nuts are all healthy, versatile protein sources. Limit red meat. You need carbohydrates for energy! The type of carbohydrate is more important than the amount. Choose  carbohydrates such as vegetables, fruits, whole grains, beans, and nuts in the place of white rice, white pasta, potatoes (baked or fried), macaroni and cheese, cakes, cookies, and donuts.  If youre thirsty, drink water. Coffee and tea are good in moderation, but skip sugary drinks and limit milk and dairy products to one or two daily servings. Keep sugar intake at 6 teaspoons or 24 grams or LESS       Exercise: Aim for 150 min of moderate intensity exercise weekly for heart health, and weights twice weekly for bone health Stay active - any steps are better than no steps! Aim for 7-9 hours of sleep daily      Heart Failure Education: Weigh yourself EVERY morning after you go to the bathroom but before you eat or drink anything. Write this number down in a weight log/diary. If you gain 3 pounds overnight or 5 pounds in a week, call the office. Take your medicines as prescribed. If you have concerns about your medications, please call us before you stop taking them.  Eat low salt foods--Limit salt (sodium) to 2000 mg per day. This will help prevent your body from holding onto fluid. Read food labels as many processed foods have a lot of sodium, especially canned goods and prepackaged meats. If you would like some assistance choosing low sodium foods, we would be happy to set you up with a nutritionist. Stay as active as you can everyday. Staying active will give you more energy and make your muscles stronger. Start with 5 minutes at a time and work your way up to 30 minutes a day. Break up your activities--do some in the morning and some in the afternoon. Start with 3 days per week and work your way up to 5 days as you can.  If you have chest pain, feel short of breath, dizzy, or lightheaded, STOP. If you don't feel better after a short rest, call 911. If you do feel better, call the office to let us know you have symptoms with exercise. Limit all fluids for the day to less than 2 liters. Fluid  includes all drinks, coffee, juice, ice chips, soup, jello, and all other liquids.   For your  leg edema you  should do  the following 1. Leg elevation - I recommend the Lounge Dr. Leg rest.  See below for details  2. Salt restriction  -  Use potassium chloride instead of regular salt as a salt substitute. 3. Walk regularly 4. Compression hose - [employer] store  5. Weight loss    Available on Amazon.com Or  Go to [email or web address]

## 2022-07-31 NOTE — Progress Notes (Unsigned)
error 

## 2022-09-28 ENCOUNTER — Ambulatory Visit (HOSPITAL_COMMUNITY): Payer: Medicare Other

## 2022-10-04 ENCOUNTER — Ambulatory Visit: Payer: 59 | Admitting: Orthopaedic Surgery

## 2022-10-05 ENCOUNTER — Ambulatory Visit (INDEPENDENT_AMBULATORY_CARE_PROVIDER_SITE_OTHER): Payer: Medicare Other | Admitting: Podiatry

## 2022-10-05 DIAGNOSIS — L84 Corns and callosities: Secondary | ICD-10-CM | POA: Diagnosis not present

## 2022-10-05 NOTE — Progress Notes (Signed)
Subjective:  Patient ID: [name], female    DOB: 19-Mar-1934,  MRN: [id number]  Chief Complaint  Patient presents with   Callouses    Corn in between right hallux and 2nd toe. Not diabetic.     88 y.o. female presents with the above complaint. History confirmed with patient. Patient presenting with pain related to corn present on the right second toe.  Does not have a history of diabetes.  Objective:  Physical Exam: warm, good capillary refill nail exam normal nails without lesions DP pulses palpable, PT pulses palpable, and protective sensation intact Left Foot: No pain or callus lesions Right Foot: Hyperkeratotic lesions on the medial aspect of the second toe of the right foot as well as the lateral aspect of the hallux due to pressure in between the toes.  No ulceration underlying either.  Pain on palpation of the lesion.  Assessment:   1. Corn of toe      Plan:  Patient was evaluated and treated and all questions answered.  #Hyperkeratotic lesions/pre ulcerative calluses present right second toe All symptomatic hyperkeratoses x 1 separate lesions were safely debrided with a sterile #10 blade to patient's level of comfort without incident. We discussed preventative and palliative care of these lesions including supportive and accommodative shoegear, padding, prefabricated and custom molded accommodative orthoses, use of a pumice stone and lotions/creams daily.  Return if symptoms worsen or fail to improve.         Corinna Gab, DPM [facility] [place]

## 2022-10-11 ENCOUNTER — Other Ambulatory Visit (INDEPENDENT_AMBULATORY_CARE_PROVIDER_SITE_OTHER): Payer: 59

## 2022-10-11 ENCOUNTER — Ambulatory Visit (INDEPENDENT_AMBULATORY_CARE_PROVIDER_SITE_OTHER): Payer: 59 | Admitting: Orthopaedic Surgery

## 2022-10-11 DIAGNOSIS — M25561 Pain in right knee: Secondary | ICD-10-CM

## 2022-10-11 NOTE — Progress Notes (Signed)
Office Visit Note   Patient: [name]           Date of Birth: Jan 30, 1934           MRN: [id number] Visit Date: 10/11/2022              Requested by: Hillery Aldo, NP [address] [place],  [place] [address] PCP: Hillery Aldo, NP   Assessment & Plan: Visit Diagnoses:  1. Acute pain of right knee     Plan: [name] is a 88 year old female with advanced DJD of the right knee especially in the lateral compartment.  Treatment options were discussed and she  she is not interested in cortisone injection at this time.  She would like to just stick to over-the-counter medications and she will pick up some Voltaren gel to try.  Knee brace and cane during activity.  Follow-up if she decides she wants to try a shot.  Follow-Up Instructions: No follow-ups on file.   Orders:  Orders Placed This Encounter  Procedures   XR KNEE 3 VIEW RIGHT   No orders of the defined types were placed in this encounter.     Procedures: No procedures performed   Clinical Data: No additional findings.   Subjective: Chief Complaint  Patient presents with   Right Knee - Pain    HPI [name] is a 88 year old female here for chronic right knee pain for many years.  Has morning stiffness and trouble bending.  Has trouble with ADLs.  Uses a cane for ambulation.  Has not had any prior injuries or surgeries or injections. Review of Systems  Constitutional: Negative.   HENT: Negative.    Eyes: Negative.   Respiratory: Negative.    Cardiovascular: Negative.   Endocrine: Negative.   Musculoskeletal: Negative.   Neurological: Negative.   Hematological: Negative.   Psychiatric/Behavioral: Negative.    All other systems reviewed and are negative.    Objective: Vital Signs: There were no vitals taken for this visit.  Physical Exam Vitals and nursing note reviewed.  Constitutional:      Appearance: She is well-developed.  HENT:     Head: Atraumatic.     Nose: Nose normal.  Eyes:      Extraocular Movements: Extraocular movements intact.  Cardiovascular:     Pulses: Normal pulses.  Pulmonary:     Effort: Pulmonary effort is normal.  Abdominal:     Palpations: Abdomen is soft.  Musculoskeletal:     Cervical back: Neck supple.  Skin:    General: Skin is warm.     Capillary Refill: Capillary refill takes less than 2 seconds.  Neurological:     Mental Status: She is alert. Mental status is at baseline.  Psychiatric:        Behavior: Behavior normal.        Thought Content: Thought content normal.        Judgment: Judgment normal.     Ortho Exam Exam of the right knee shows lateral joint line tenderness.  No significant malalignments.  No joint effusion. Specialty Comments:  No specialty comments available.  Imaging: XR KNEE 3 VIEW RIGHT  Result Date: 10/11/2022 X-rays demonstrate severe osteoarthritis.  Bone-on-bone joint space narrowing of the lateral compartment with erosion and irregularity of the lateral femoral condyle and lateral tibial plateau.    PMFS History: Patient Active Problem List   Diagnosis Date Noted   Weight loss 09/26/2021   Cognitive changes 08/08/2021   Acute cystitis 08/01/2021  Chronic diastolic CHF (congestive heart failure) (HCC) 08/01/2021   Acute renal failure superimposed on stage 4 chronic kidney disease (HCC) 07/31/2021   Nonrheumatic tricuspid valve regurgitation 12/09/2019   Nonrheumatic mitral valve regurgitation 10/30/2019   Benign positional vertigo 09/21/2016   Preventative health care 09/11/2016   Vaginal prolapse 07/19/2016   Insomnia 05/03/2015   GERD (gastroesophageal reflux disease) 05/03/2015   Meningioma (HCC) 03/01/2015   Tinnitus 01/24/2015   History of CVA (cerebrovascular accident) 01/24/2015   Gout 03/03/2014   Hearing loss 11/03/2013   Osteoporosis 03/06/2012   Depression 08/30/2009   Anemia associated with stage 4 chronic renal failure (HCC) 06/01/2009   Disease of pericardium 06/01/2009   CKD  (chronic kidney disease) stage 4, GFR 15-29 ml/min (HCC) 06/01/2009   CATARACTS 10/24/2005   Essential hypertension 10/24/2005   ANGIODYSPLASIA, COLON 05/09/2005   Past Medical History:  Diagnosis Date   Anemia due to blood loss, chronic    Anemia, iron deficiency    Angiodysplasia of colon 05/09/2005   Single small angiodysplastic lesion in the descending colon found on colonoscopy 05/09/2005 by Dr. Carman Ching   Arthritis    "knots on my thumbs; left elbow" (03/03/2014)   Benign hypertensive heart and kidney disease and CKD stage V (HCC) 10/30/2019   Cataracts, bilateral    Chest pain, non-cardiac 05/30/2017   Chronic ankle pain    bilateral   Chronic kidney disease, stage IV (severe) (HCC)    Hattie Perch 02/09/2014   Chronic knee pain    bilateral   Chronic thumb pain    Depression    Diverticulitis of large intestine without perforation or abscess without bleeding 05/21/2018   Ear noise 01/07/2013   Epicondylitis    Fatigue 04/20/2015   Gastritis, Helicobacter pylori    Noted on EGD 01/18/2009 by Dr. Carman Ching;  biopsy showed chronic gastritis with Helicobacter pylori, treated by Dr. Randa Evens.   GERD (gastroesophageal reflux disease)    Gout    Hemorrhoids, internal 05/09/2005    Found on colonoscopy 05/09/2005 by Dr. Carman Ching   History of blood transfusion    "low blood count"   Hypertension    Osteoporosis, unspecified 03/06/2012   A DXA bone density scan done 02/20/2012 showed osteoporosis with a lumbar spine T-score of -4.4 and a right femur T-score of -2.8.  Patient has chronic kidney disease with estimated GFR less than 30, and it is not clear how much of her osteoporosis may be due to renal osteodystrophy.     Pericardial effusion 11/2004   S/P subxiphoid pericardial window 11/01/2004 by Dr. Edwina Barth.  A question of possible collagen vascular disease had been raised in 2006 due to arthralgias, a history of idiopathic pericarditis, and increased pigmentation of her  palms, and she was referred to Dr. Coral Spikes but he was unable to confirm a rheumatologic diagnosis.   Pinguecula    Renal failure    Formerly on hemodialysis; managed by Dr. Annie Sable   Stiffness of joint, not elsewhere classified, hand    Stroke [facility]) 2007   "when my kidneys went out & I was in a coma; weak LUE/LLE since"   Subjective tinnitus of both ears 01/24/2018   Thigh pain    UTI (urinary tract infection) 03/21/2011   Vitreous detachment    "no OR"    Family History  Problem Relation Age of Onset   Hypertension Brother    Heart disease Father    Diabetes Brother    Breast cancer Neg Hx  Colon cancer Neg Hx    Lung cancer Neg Hx    Cervical cancer Neg Hx    Sickle cell anemia Neg Hx     Past Surgical History:  Procedure Laterality Date   ABDOMINAL HYSTERECTOMY  1960's   ARTERIOVENOUS GRAFT PLACEMENT Left 11/28/2005   forearm arteriovenous   CARDIAC CATHETERIZATION  01/2005   Hattie Perch 05/15/2010   HALLUX VALGUS CORRECTION Left 01/2012   foot   SUBXYPHOID PERICARDIAL WINDOW  11/01/2004   THROMBECTOMY Left  03/06/2006   Simple thrombectomy arteriovenous Gore-Tex graft, left arm, with exploration of venous end and intraoperative shuntogram.          THROMBECTOMY / ARTERIOVENOUS GRAFT REVISION Left 01/21/2006   Thrombectomy and revision of left forearm loop AV graft.   TUBAL LIGATION  1960's   "before hysterectomy"   Social History   Occupational History   Not on file  Tobacco Use   Smoking status: Former    Current packs/day: 0.00    Average packs/day: 0.1 packs/day for 1 year (0.1 ttl pk-yrs)    Types: Cigarettes    Start date: 01/02/1959    Quit date: 01/02/1960    Years since quitting: 62.8   Smokeless tobacco: Never   Tobacco comments:    patient stated has never smoked   Vaping Use   Vaping status: Never Used  Substance and Sexual Activity   Alcohol use: No    Alcohol/week: 0.0 standard drinks of alcohol   Drug use: No   Sexual activity: Not on  file

## 2022-10-18 ENCOUNTER — Ambulatory Visit (HOSPITAL_COMMUNITY): Payer: 59

## 2022-10-21 ENCOUNTER — Other Ambulatory Visit: Payer: Self-pay | Admitting: Internal Medicine

## 2022-10-21 DIAGNOSIS — F32A Depression, unspecified: Secondary | ICD-10-CM

## 2022-10-23 NOTE — Telephone Encounter (Signed)
Next appt scheduled 12/11 with Dr Antony Contras.

## 2022-10-23 NOTE — Telephone Encounter (Signed)
I do not think I have ever seen this patient and she has another PCP listed?

## 2022-10-23 NOTE — Telephone Encounter (Signed)
Yes but she has never seen me personally. I will refill the Wellbutrin but can you please have her reassigned?

## 2022-10-24 ENCOUNTER — Encounter: Payer: 59 | Admitting: Internal Medicine

## 2022-11-08 ENCOUNTER — Ambulatory Visit (HOSPITAL_COMMUNITY): Payer: 59 | Attending: Physician Assistant

## 2022-11-08 DIAGNOSIS — R9389 Abnormal findings on diagnostic imaging of other specified body structures: Secondary | ICD-10-CM | POA: Insufficient documentation

## 2022-11-08 DIAGNOSIS — I1311 Hypertensive heart and chronic kidney disease without heart failure, with stage 5 chronic kidney disease, or end stage renal disease: Secondary | ICD-10-CM | POA: Diagnosis not present

## 2022-11-08 DIAGNOSIS — I361 Nonrheumatic tricuspid (valve) insufficiency: Secondary | ICD-10-CM | POA: Insufficient documentation

## 2022-11-08 DIAGNOSIS — I340 Nonrheumatic mitral (valve) insufficiency: Secondary | ICD-10-CM | POA: Diagnosis not present

## 2022-11-08 DIAGNOSIS — N185 Chronic kidney disease, stage 5: Secondary | ICD-10-CM | POA: Diagnosis present

## 2022-11-08 DIAGNOSIS — I10 Essential (primary) hypertension: Secondary | ICD-10-CM | POA: Diagnosis present

## 2022-11-08 LAB — ECHOCARDIOGRAM COMPLETE
Area-P 1/2: 4.06 cm2
P 1/2 time: 374 ms
S' Lateral: 2.6 cm

## 2022-11-30 ENCOUNTER — Other Ambulatory Visit: Payer: Self-pay | Admitting: Internal Medicine

## 2022-11-30 DIAGNOSIS — N184 Chronic kidney disease, stage 4 (severe): Secondary | ICD-10-CM

## 2022-12-12 ENCOUNTER — Encounter: Payer: 59 | Admitting: Internal Medicine

## 2023-01-23 ENCOUNTER — Other Ambulatory Visit: Payer: Self-pay | Admitting: Internal Medicine

## 2023-01-23 DIAGNOSIS — F32A Depression, unspecified: Secondary | ICD-10-CM

## 2023-07-24 ENCOUNTER — Other Ambulatory Visit: Payer: Self-pay | Admitting: Physician Assistant

## 2024-01-10 ENCOUNTER — Other Ambulatory Visit (HOSPITAL_COMMUNITY): Payer: Self-pay | Admitting: Nephrology

## 2024-01-10 ENCOUNTER — Telehealth (HOSPITAL_COMMUNITY): Payer: Self-pay | Admitting: Pharmacy Technician

## 2024-01-10 ENCOUNTER — Encounter (HOSPITAL_COMMUNITY): Payer: Self-pay | Admitting: Nephrology

## 2024-01-10 NOTE — Progress Notes (Signed)
 Vneofer 300mg  x 1 referral from [facility] (dr. Melia)  Sherry Pennant, PharmD, MPH, BCPS, CPP Clinical Pharmacist

## 2024-01-10 NOTE — Telephone Encounter (Signed)
 Auth Submission: NO AUTH NEEDED Site of care: [facility] Payer: [employer] MEDICARE Medication & CPT/J Code(s) submitted: Venofer (Iron Sucrose) J1756 Diagnosis Code: N17.9, N18.4, D63.1 Route of submission (phone, fax, portal): CHECKED [facility] PORTAL Phone # Fax # Auth type: Buy/Bill HB Units/visits requested: 300MG  X 1 DOSE Reference number: [id number] Approval from: 01/10/2024 to 03/09/24    Dagoberto Armour, CPhT [facility] Phone: [phone] 01/10/2024

## 2024-02-06 ENCOUNTER — Encounter (HOSPITAL_COMMUNITY): Payer: Self-pay | Admitting: Nephrology

## 2024-02-13 ENCOUNTER — Encounter (HOSPITAL_COMMUNITY)
